# Patient Record
Sex: Male | Born: 1955
Health system: Southern US, Community
[De-identification: ages and names within clinical notes are randomized; demographics above are authoritative.]

## PROBLEM LIST (undated history)

## (undated) DIAGNOSIS — F329 Major depressive disorder, single episode, unspecified: Secondary | ICD-10-CM

## (undated) DIAGNOSIS — Z950 Presence of cardiac pacemaker: Secondary | ICD-10-CM

## (undated) DIAGNOSIS — K220 Achalasia of cardia: Secondary | ICD-10-CM

## (undated) DIAGNOSIS — R001 Bradycardia, unspecified: Secondary | ICD-10-CM

## (undated) DIAGNOSIS — K219 Gastro-esophageal reflux disease without esophagitis: Secondary | ICD-10-CM

## (undated) DIAGNOSIS — G4730 Sleep apnea, unspecified: Secondary | ICD-10-CM

## (undated) DIAGNOSIS — F3289 Other specified depressive episodes: Secondary | ICD-10-CM

## (undated) DIAGNOSIS — M1990 Unspecified osteoarthritis, unspecified site: Secondary | ICD-10-CM

## (undated) DIAGNOSIS — I10 Essential (primary) hypertension: Secondary | ICD-10-CM

## (undated) DIAGNOSIS — J690 Pneumonitis due to inhalation of food and vomit: Secondary | ICD-10-CM

## (undated) DIAGNOSIS — K222 Esophageal obstruction: Secondary | ICD-10-CM

## (undated) DIAGNOSIS — M549 Dorsalgia, unspecified: Secondary | ICD-10-CM

## (undated) DIAGNOSIS — E119 Type 2 diabetes mellitus without complications: Secondary | ICD-10-CM

## (undated) HISTORY — DX: Unspecified osteoarthritis, unspecified site: M19.90

## (undated) HISTORY — PX: ESOPHAGEAL DILATION: SHX303

## (undated) HISTORY — DX: Other specified depressive episodes: F32.89

## (undated) HISTORY — DX: Presence of cardiac pacemaker: Z95.0

## (undated) HISTORY — DX: Esophageal obstruction: K22.2

## (undated) HISTORY — DX: Morbid (severe) obesity due to excess calories: E66.01

## (undated) HISTORY — DX: Bradycardia, unspecified: R00.1

## (undated) HISTORY — DX: Major depressive disorder, single episode, unspecified: F32.9

## (undated) HISTORY — DX: Sleep apnea, unspecified: G47.30

## (undated) HISTORY — DX: Gastro-esophageal reflux disease without esophagitis: K21.9

## (undated) HISTORY — DX: Dorsalgia, unspecified: M54.9

## (undated) HISTORY — PX: PACEMAKER INSERTION: SHX728

## (undated) HISTORY — DX: Type 2 diabetes mellitus without complications: E11.9

## (undated) HISTORY — DX: Achalasia of cardia: K22.0

## (undated) SURGERY — Surgical Case
Anesthesia: *Unknown

---

## 2007-09-19 ENCOUNTER — Encounter (INDEPENDENT_AMBULATORY_CARE_PROVIDER_SITE_OTHER): Payer: Self-pay | Admitting: *Deleted

## 2007-09-19 LAB — CONVERTED CEMR LAB
Cholesterol: 212 mg/dL
Creatinine, Ser: 0.8 mg/dL
Direct LDL: 137 mg/dL
Glucose, Bld: 115 mg/dL
PSA: 0.58 ng/mL
TSH: 176 microintl units/mL
Triglycerides: 202 mg/dL

## 2007-10-03 ENCOUNTER — Emergency Department (HOSPITAL_COMMUNITY): Admission: EM | Admit: 2007-10-03 | Discharge: 2007-10-03 | Payer: Self-pay | Admitting: Emergency Medicine

## 2007-10-28 ENCOUNTER — Encounter: Admission: RE | Admit: 2007-10-28 | Discharge: 2007-10-28 | Payer: Self-pay | Admitting: Gastroenterology

## 2007-10-28 ENCOUNTER — Encounter: Payer: Self-pay | Admitting: Family Medicine

## 2007-11-20 ENCOUNTER — Encounter: Payer: Self-pay | Admitting: Family Medicine

## 2007-11-20 ENCOUNTER — Ambulatory Visit (HOSPITAL_COMMUNITY): Admission: RE | Admit: 2007-11-20 | Discharge: 2007-11-20 | Payer: Self-pay | Admitting: Gastroenterology

## 2008-04-17 ENCOUNTER — Encounter: Payer: Self-pay | Admitting: Family Medicine

## 2009-12-07 ENCOUNTER — Ambulatory Visit: Payer: Self-pay | Admitting: Family Medicine

## 2009-12-07 DIAGNOSIS — E669 Obesity, unspecified: Secondary | ICD-10-CM | POA: Insufficient documentation

## 2009-12-07 DIAGNOSIS — G4733 Obstructive sleep apnea (adult) (pediatric): Secondary | ICD-10-CM | POA: Insufficient documentation

## 2009-12-07 DIAGNOSIS — K219 Gastro-esophageal reflux disease without esophagitis: Secondary | ICD-10-CM | POA: Insufficient documentation

## 2009-12-08 ENCOUNTER — Encounter: Payer: Self-pay | Admitting: Family Medicine

## 2009-12-16 ENCOUNTER — Ambulatory Visit: Payer: Self-pay | Admitting: Family Medicine

## 2009-12-29 ENCOUNTER — Encounter: Payer: Self-pay | Admitting: Family Medicine

## 2010-01-05 ENCOUNTER — Ambulatory Visit: Payer: Self-pay | Admitting: Family Medicine

## 2010-01-05 DIAGNOSIS — M549 Dorsalgia, unspecified: Secondary | ICD-10-CM | POA: Insufficient documentation

## 2010-01-21 ENCOUNTER — Encounter (INDEPENDENT_AMBULATORY_CARE_PROVIDER_SITE_OTHER): Payer: Self-pay | Admitting: *Deleted

## 2010-01-21 DIAGNOSIS — K222 Esophageal obstruction: Secondary | ICD-10-CM | POA: Insufficient documentation

## 2010-01-21 DIAGNOSIS — M1710 Unilateral primary osteoarthritis, unspecified knee: Secondary | ICD-10-CM | POA: Insufficient documentation

## 2010-01-21 DIAGNOSIS — Z8679 Personal history of other diseases of the circulatory system: Secondary | ICD-10-CM | POA: Insufficient documentation

## 2010-01-21 DIAGNOSIS — M1712 Unilateral primary osteoarthritis, left knee: Secondary | ICD-10-CM | POA: Insufficient documentation

## 2010-01-21 DIAGNOSIS — M1990 Unspecified osteoarthritis, unspecified site: Secondary | ICD-10-CM | POA: Insufficient documentation

## 2010-01-21 DIAGNOSIS — F329 Major depressive disorder, single episode, unspecified: Secondary | ICD-10-CM | POA: Insufficient documentation

## 2010-01-21 DIAGNOSIS — Z950 Presence of cardiac pacemaker: Secondary | ICD-10-CM | POA: Insufficient documentation

## 2010-01-21 DIAGNOSIS — F3289 Other specified depressive episodes: Secondary | ICD-10-CM | POA: Insufficient documentation

## 2010-01-26 ENCOUNTER — Encounter: Payer: Self-pay | Admitting: Family Medicine

## 2010-06-28 NOTE — Letter (Signed)
Summary: [facility]  [facility]   Imported By: Lanelle Bal 02/01/2010 09:06:11  _____________________________________________________________________  External Attachment:    Type:   Image     Comment:   External Document

## 2010-06-28 NOTE — Letter (Signed)
Summary: Deboraha Sprang @ [facility] @ [employer]   Imported By: Lanelle Bal 02/01/2010 09:02:48  _____________________________________________________________________  External Attachment:    Type:   Image     Comment:   External Document

## 2010-06-28 NOTE — Letter (Signed)
Summary: Order/[facility]  Order/[facility]   Imported By: Lanelle Bal 12/14/2009 10:32:30  _____________________________________________________________________  External Attachment:    Type:   Image     Comment:   External Document

## 2010-06-28 NOTE — Assessment & Plan Note (Signed)
Summary: pulled back/dlo   Vital Signs:  Patient profile:   55 year old male Height:      72 inches Weight:      336.25 pounds BMI:     45.77 Temp:     98.3 degrees F oral Pulse rate:   92 / minute Pulse rhythm:   regular BP sitting:   142 / 88  (left arm) Cuff size:   large  Vitals Entered By: Delilah Shan CMA Duncan Dull) (January 05, 2010 3:36 PM) CC: Pulled back   History of Present Illness: Moved a lot of furniture and items at work last week.  Out of work yesterday.  Less pain in R leg today with walking.  Was very stiff in AM when getting out of bed.  Had been icing lower back.  R>L lumbar symptoms.  No fevers.  No change BMs other than occ  constipation.  No BRBPR.  No change urine.  Most pain with sitting and lower back not supported.  Pain with walking.  Occ episodes of pain radiating down R leg.  Trying to avoid NSAIDS.   Allergies: 1)  ! 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  Otherwise negative.    Physical Exam  General:  GEN: nad, alert and oriented CV: rrr.  no murmur PULM: ctab, no inc wob ABD: soft, +bs, obese EXT: no edema SKIN: no acute rash  back w/o midline pain.  R SI joint tender on testing.  SLR neg bilaterally, distally NV intact.    Impression & Recommendations:  Problem # 1:  BACK PAIN (ICD-724.5) D/w patient stretching and as needed flexeril.  Sedation caution.  No indication for imaging.  His updated medication list for this problem includes:    Ibuprofen 200 Mg Tabs (Ibuprofen) .Marland Kitchen... As needed    Flexeril 10 Mg Tabs (Cyclobenzaprine hcl) .Marland Kitchen... 0.5-1 tab by mouth three times a day as needed for pain.  sedation caution  Complete Medication List: 1)  Prilosec 20 Mg Cpdr (Omeprazole) .... Takes 2 tablets by mouth once daily 2)  Ibuprofen 200 Mg Tabs (Ibuprofen) .... As needed 3)  Flexeril 10 Mg Tabs (Cyclobenzaprine  hcl) .... 0.5-1 tab by mouth three times a day as needed for pain.  sedation caution  Patient Instructions: 1)  Please schedule a follow-up appointment as needed.  You can go back to work tomrrow as long as you are not needing sedating pain meds.   Do knee-to-chest stretchs and the leg stretches daily.  Prescriptions: FLEXERIL 10 MG TABS (CYCLOBENZAPRINE HCL) 0.5-1 tab by mouth three times a day as needed for pain.  sedation caution  #20 x 1   Entered and Authorized by:   Crawford Givens MD   Signed by:   Crawford Givens MD on 01/05/2010   Method used:   Print then Give to Patient   RxID:   [id number]   Current Allergies (reviewed today): ! MORPHINE

## 2010-06-28 NOTE — Procedures (Signed)
Summary: EGD/[facility]  EGD/[facility]   Imported By: Lanelle Bal 02/01/2010 09:07:12  _____________________________________________________________________  External Attachment:    Type:   Image     Comment:   External Document

## 2010-06-28 NOTE — Assessment & Plan Note (Signed)
Summary: ? HERNIA   Vital Signs:  Patient profile:   55 year old male Height:      72 inches Weight:      336.75 pounds BMI:     45.84 Temp:     98.2 degrees F oral Pulse rate:   88 / minute Pulse rhythm:   regular BP sitting:   126 / 86  (left arm) Cuff size:   large  Vitals Entered By: Delilah Shan CMA Takashi Korol Dull) (December 16, 2009 9:32 AM) CC: ? hernia   History of Present Illness: Wife noticed a bulge in abdomen that "is getting larger according to her" . No FCNAV.  No pain in the area.   No problems/pain with lifting heavy objects.  No other new symptoms.     Allergies: 1)  ! Morphine  Review of Systems       See HPI.  Otherwise noncontributory.    Physical Exam  General:  NAD abdominal exam: no tender to palpation, normal BS, no rebound.  Longitudinal midline bulge is appreciated with straining but I don't feel a specific, isolated defect with any reduceable mass.  There is no tenderness anywhere on the exam.    Impression & Recommendations:  Problem # 1:  PERSON W/FEARED COMPLAINT WHOM NO DX WAS MADE (ICD-V65.5) I don't feel an isolated hernia and I don't think this is a traditional diastasis recti.  I don't see indication for surgical referral.  If patient has an isolated bulge then follow up, but o/w the treatment would be weight loss and observation.  No charge for visit due to the brevity of the encounter.  Orders: No Charge Patient Arrived (NCPA0) (NCPA0)  Complete Medication List: 1)  Prilosec 20 Mg Cpdr (Omeprazole) .... Takes 2 tablets by mouth once daily 2)  Ibuprofen 200 Mg Tabs (Ibuprofen) .... As needed  Patient Instructions: 1)  Please schedule a follow-up appointment as needed .   Current Allergies (reviewed today): ! MORPHINE

## 2010-06-28 NOTE — Letter (Signed)
Summary: Patient No Show/[facility]  Patient No Show/[facility]   Imported By: Lanelle Bal 02/07/2010 08:34:16  _____________________________________________________________________  External Attachment:    Type:   Image     Comment:   External Document

## 2010-06-28 NOTE — Assessment & Plan Note (Signed)
Summary: PT TRANSFER FROM [facility]   Vital Signs:  Patient profile:   55 year old male Height:      72 inches Weight:      338 pounds BMI:     46.01 Temp:     98.3 degrees F oral Pulse rate:   88 / minute Pulse rhythm:   regular BP sitting:   126 / 82  (left arm) Cuff size:   large  Vitals Entered By: Glenn Lyons CMA Glenn Lyons) (December 07, 2009 11:28 AM) CC: Transfer from [place]   History of Present Illness: Obesity- Working to lose weight.  Down about 20lbs.  Knee pain continues esp with stairs and squatting.  Working at Programme researcher, broadcasting/film/video now.  Has done the Atkins diet before but gained the weight back.  patient is aware of disordered eating/overeating.  Interested in nutrition eval/help.  Had prev done food diary.   Reflux continues.  Prev EGD with esophageal stretching twice.  "My eating habits haven't changed."  Prev did foot diary.  "Some days are better than others."  Continues singing with gospel group.  Voice has not been affected.  Occ regurgitation.  Occ feeling of food getting stuck.  Can occ reflux liquids.  Some foods tend to bother patient more than others.    H/o OSA and has CPAP.    Allergies (verified): 1)  ! 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  no murmur PULM: ctab, no inc wob ABD: soft, +bs, obese, benign o/w. EXT: no edema   Impression & Recommendations:  Problem # 1:  GERD (ICD-530.81) Refer to nutrition to help with weight loss and refer to GI is patient requests (or if sx increase).  He would like to avoid another EGD/dilation.  I think that weight loss would be very benefical for his condition.  I have d/w patient AV:[id number] modification and he is willing to work on this.  App nutrition help. continue meds for now.  His updated medication list for this problem includes:    Prilosec 20 Mg Cpdr  (Omeprazole) .Marland Kitchen... Takes 2 tablets by mouth once daily  Orders: Nutrition Referral (Nutrition)  Complete Medication List: 1)  Prilosec 20 Mg Cpdr (Omeprazole) .... Takes 2 tablets by mouth once daily 2)  Ibuprofen 200 Mg Tabs (Ibuprofen) .... As needed  Patient Instructions: 1)  Please schedule a follow-up appointment as needed.  See Glenn Lyons about your referral before your leave today.    Current Allergies (reviewed today): ! MORPHINE

## 2010-06-28 NOTE — Miscellaneous (Signed)
  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 Randa Evens PACEMAKER, PERMANENT (ICD-V45.01) Dual Chamber, St. Jude  04/2005 ANGINA, HX OF (ICD-V12.50) DEPRESSION (ICD-311) BACK PAIN (ICD-724.5) PERSON W/FEARED COMPLAINT WHOM NO DX WAS MADE (ICD-V65.5) GERD (ICD-530.81) SLEEP APNEA (ICD-780.57) MORBID OBESITY (ICD-278.01)   (01/21/2010 14:56) Added new observation of TSH: 176 microintl units/mL (09/19/2007 14:56) Added new observation of PSA: 0.58 ng/mL (09/19/2007 14:56) Added new observation of CREATININE: 0.80 mg/dL (29/51/8841 66:06) Added new observation of BG RANDOM: 115 mg/dL (30/16/0109 32:35) Added new observation of LDL DIR: 137 mg/dL (57/32/2025 42:70) Added new observation of TRIGLYC TOT: 202 mg/dL (62/37/6283 15:17) Added new observation of CHOLESTEROL: 212 mg/dL (61/60/7371 06:26)      Past History:  Past Medical History: OSTEOARTHRITIS (ICD-715.90) ESOPHAGEAL STRICTURE (ICD-530.3) Dr. Randa Evens PACEMAKER,  PERMANENT (ICD-V45.01) Dual Chamber, St. 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

## 2010-07-07 ENCOUNTER — Ambulatory Visit: Payer: Self-pay | Admitting: Family Medicine

## 2010-07-11 ENCOUNTER — Encounter (INDEPENDENT_AMBULATORY_CARE_PROVIDER_SITE_OTHER): Payer: Self-pay | Admitting: *Deleted

## 2010-07-11 ENCOUNTER — Encounter: Payer: Self-pay | Admitting: Family Medicine

## 2010-07-11 ENCOUNTER — Ambulatory Visit (INDEPENDENT_AMBULATORY_CARE_PROVIDER_SITE_OTHER): Payer: BC Managed Care – PPO | Admitting: Family Medicine

## 2010-07-11 ENCOUNTER — Ambulatory Visit: Payer: Self-pay | Admitting: Family Medicine

## 2010-07-11 DIAGNOSIS — J019 Acute sinusitis, unspecified: Secondary | ICD-10-CM

## 2010-07-20 NOTE — Letter (Signed)
Summary: New Patient letter  [facility]  [address] [place], [place] [address]   Phone: [phone]  Fax: [phone]       07/11/2010 MRN: [id number]  [facility] [address] [place], [place]  [address]  [place]  Dear Glenn Lyons,  Welcome to the Gastroenterology Division at [facility].    You are scheduled to see Dr.  Stan Head on August 17, 2010 at 1:45pm on the 3rd floor at [facility], [address]  We ask that you try to arrive at our office 15 minutes prior to your appointment time to allow for check-in.  We would like you to complete the enclosed self-administered evaluation form prior to your visit and bring it with you on the day of your appointment.  We will review it with you.  Also, please bring a complete list of all your medications or, if you prefer, bring the medication bottles and we will list them.  Please bring your insurance card so that we may make a copy of it.  If your insurance requires a referral to see a specialist, please bring your referral form from your primary care physician.  Co-payments are due at the time of your visit and may be paid by cash, check or credit card.     Your office visit will consist of a consult with your physician (includes a physical exam), any laboratory testing he/she may order, scheduling of any necessary diagnostic testing (e.g. x-ray, ultrasound, CT-scan), and scheduling of a procedure (e.g. Endoscopy, Colonoscopy) if required.  Please allow enough time on your schedule to allow for any/all of these possibilities.    If you cannot keep your appointment, please call [phone] to cancel or reschedule prior to your appointment date.  This allows [place] the opportunity to schedule an appointment for another patient in need of care.  If you do not cancel or reschedule by 5 p.m. the business day prior to your appointment date, you will be charged a $50.00 late cancellation/no-show fee.    Thank you  for choosing  Gastroenterology for your medical needs.  We appreciate the opportunity to care for you.  Please visit [place] at our website  to learn more about our practice.                     Sincerely,                                                             The Gastroenterology Division

## 2010-07-20 NOTE — Assessment & Plan Note (Signed)
Summary: sinus infection/alc   Vital Signs:  Patient profile:   55 year old male Height:      72 inches Weight:      337.50 pounds BMI:     45.94 Temp:     98.3 degrees F oral Pulse rate:   84 / minute Pulse rhythm:   regular BP sitting:   102 / 64  (left arm) Cuff size:   large  Vitals Entered By: Delilah Shan CMA Phuoc Huy Dull) (July 11, 2010 3:45 PM) CC: ? sinus infection   History of Present Illness: Allergies worse after moving to [place].  Usually with clear, intermittent rhinorrhea.  Now with colored discharge and voice change.  HA for 1 week intermittently.  ST.  No fevers.  Ears are "stopped up".  No coughing much, minimal sputum.  No wheeze.    due for colonoscopy.  Allergies: 1)  ! Morphine  Review of Systems       See HPI.  Otherwise negative.    Physical Exam  General:  GEN: nad, alert and oriented HEENT: mucous membranes moist, TM w/o erythema, nasal epithelium injected, OP with cobblestoning, no exudates NECK: supple w/o LA CV: rrr. PULM: ctab, no inc wob EXT: no edema    Impression & Recommendations:  Problem # 1:  SCREENING, COLON CANCER (ICD-V76.51) refer.  Orders: Gastroenterology Referral (GI)  Problem # 2:  SINUSITIS, ACUTE (ICD-461.9) I would start amoxil and flonase given the timing and the symptoms.  nontoxic and follow up as needed.  he agrees.  Voice rest in meantime.  His updated medication list for this problem includes:    Flonase 50 Mcg/act Susp (Fluticasone propionate) .Marland Kitchen... 2 sprays per nostril per day    Amoxicillin 875 Mg Tabs (Amoxicillin) .Marland Kitchen... 1 by mouth two times a day  Orders: Prescription Created Electronically [phone])  Complete Medication List: 1)  Prilosec 20 Mg Cpdr (Omeprazole) .... Takes 2 tablets by mouth once daily 2)  Ibuprofen 200 Mg Tabs (Ibuprofen) .... As needed 3)  Flexeril 10 Mg Tabs (Cyclobenzaprine hcl) .... 0.5-1 tab by mouth three times a day as needed for pain.  sedation caution 4)  Flonase 50 Mcg/act Susp  (Fluticasone propionate) .... 2 sprays per nostril per day 5)  Amoxicillin 875 Mg Tabs (Amoxicillin) .Marland Kitchen.. 1 by mouth two times a day  Patient Instructions: 1)  Start the antibiotics and the flonase today.  Try to rest your voice.  See Shirlee Limerick about your referral before your leave today.  Take care.  Prescriptions: AMOXICILLIN 875 MG TABS (AMOXICILLIN) 1 by mouth two times a day  #20 x 0   Entered and Authorized by:   Crawford Givens MD   Signed by:   Crawford Givens MD on 07/11/2010   Method used:   Electronically to        [facility] [phone]* (retail)       [address]       [place], [place]  [address]       Ph: [phone]       Fax: [phone]   RxID:   [id number] FLONASE 50 MCG/ACT SUSP (FLUTICASONE PROPIONATE) 2 sprays per nostril per day  #1 x 1   Entered and Authorized by:   Crawford Givens MD   Signed by:   Crawford Givens MD on 07/11/2010   Method used:   [facility] to        [facility] [phone]* (retail)  [address]       [place], [place]  [address]       Ph: [phone]       Fax: [phone]   RxID:   [id number]    Orders Added: 1)  Est. Patient Level III [401[address]] 2)  Prescription Created Electronically [phone] 3)  Gastroenterology Referral [GI]    Current Allergies (reviewed today): ! MORPHINE

## 2010-07-20 NOTE — Letter (Signed)
Summary: Out of Work  [facility]  [address] [place], [place] [address]   Phone: [phone]  Fax: [phone]    July 11, 2010   Employee:  Levan Broeker    To Whom It May Concern:   For Medical reasons, please excuse the above named employee from work for the following dates:  Start:   07/11/10  End:   allow patient to work from home through 07/12/10 and rest voice.  If you need additional information, please feel free to contact our office.         Sincerely,    Crawford Givens MD

## 2010-07-27 ENCOUNTER — Telehealth: Payer: Self-pay | Admitting: Family Medicine

## 2010-08-04 NOTE — Progress Notes (Signed)
Summary: Amoxicillin  Phone Note Refill Request Message from:  Fax from Pharmacy on July 27, 2010 9:13 AM  Refills Requested: Medication #1:  AMOXICILLIN 875 MG TABS 1 by mouth two times a day. [facility] [place]  Phone:   [phone]   Method Requested: Electronic Initial call taken by: Delilah Shan CMA Armanda Forand Dull),  July 27, 2010 9:14 AM  Follow-up for Phone Call        please clarify this with the patient.  what is his status?  What are his symptoms?  Crawford Givens MD  July 27, 2010 5:59 PM  Spoke with wife, she says that patient had started to feel some better while taking the amoxicillin, but as soon as he had finished them his symptoms started to come bak. He is having some cough, some green mucus, throat feeling scratchy.  Melody Comas  July 28, 2010 11:55 AM    Additional Follow-up for Phone Call Additional follow up Details #1::        I sent it in. please have him follow up if not improved.  thanks.  Crawford Givens MD  July 28, 2010 1:17 PM   Patient Advised.  Additional Follow-up by: Delilah Shan CMA Ekta Dancer Dull),  July 28, 2010 2:03 PM    Prescriptions: AMOXICILLIN 875 MG TABS (AMOXICILLIN) 1 by mouth two times a day  #20 x 0   Entered and Authorized by:   Crawford Givens MD   Signed by:   Crawford Givens MD on 07/28/2010   Method used:   [facility]ronically [facility]        [facility] [phone]* (retail)       [address]       [place], [place]  [address]       Ph: [phone]       Fax: [phone]   RxID:   [id number]

## 2010-08-17 ENCOUNTER — Ambulatory Visit (INDEPENDENT_AMBULATORY_CARE_PROVIDER_SITE_OTHER): Payer: BC Managed Care – PPO | Admitting: Internal Medicine

## 2010-08-17 ENCOUNTER — Encounter: Payer: Self-pay | Admitting: Internal Medicine

## 2010-08-17 VITALS — BP 104/70 | HR 60 | Ht 72.0 in | Wt 338.8 lb

## 2010-08-17 DIAGNOSIS — R1319 Other dysphagia: Secondary | ICD-10-CM

## 2010-08-17 DIAGNOSIS — Z1211 Encounter for screening for malignant neoplasm of colon: Secondary | ICD-10-CM

## 2010-08-17 DIAGNOSIS — K219 Gastro-esophageal reflux disease without esophagitis: Secondary | ICD-10-CM

## 2010-08-17 DIAGNOSIS — K224 Dyskinesia of esophagus: Secondary | ICD-10-CM

## 2010-08-17 DIAGNOSIS — K222 Esophageal obstruction: Secondary | ICD-10-CM

## 2010-08-17 DIAGNOSIS — K220 Achalasia of cardia: Secondary | ICD-10-CM | POA: Insufficient documentation

## 2010-08-17 NOTE — Assessment & Plan Note (Signed)
He has some of this but is probably on the basis of dysmotility. He does not have classic heartburn problems. The balance of evidence goes against a peptic stricture.

## 2010-08-17 NOTE — Progress Notes (Signed)
HPI:   55 year old white male here to discuss dysphagia and possible screening colonoscopy. He was referred by Dr. Para March. He has a long-standing history of dysphagia going back 6-8 years. He says he had a good response to initial esophageal dilation. I have records that show subsequent dilation in June of 2009, where a 15 mm maximum Savary dilation was performed by Dr. Randa Evens. He says he never really had a good response to that. There is also a barium swallow demonstrating marked esophageal dysmotility with ineffective peristalsis, stasis and tertiary contractions. A distal esophageal stricture was described. In the endoscopy report it is noted that there was a " maybe some mild narrowing ". The barium tablet administered during the barium swallow did pass with a moderate delay.   He describes chronic regurgitation of food and liquids. Sometimes worse than others. He has not lost weight and he is actually obese. He did lose with about 25 pounds last year on the Atkins diet. He has solid more so than liquid dysphagia but sometimes liquids are a problem. He will awaken with a coating in his mouth and sometimes the rarely a small amount of fluid on the pillow. He does wear a sleep apnea mask at night. If he bends over he will regurgitate. 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 He does not recall being given a diagnosis of esophageal dysmotility though he has been diagnosed with GERD. He is not aware of achalasia as a diagnosis.   Note that he was originally diagnosed with dysphagia and esophageal dysmotility when he presented with chest pain that was thought to be cardiac.   GI review of systems is otherwise negative.          ROS: Notable for sleep apnea problems, all other review systems are negative except as otherwise noted above.       General: Well-developed, well-nourished and in no acute distress obese Vitals:  Reviewed and listed above Eyes:anicteric., PERRLA Mouth and posterior pharynx: normal.  Neck: supple w/o thyromegaly or mass.  Lungs: clear. Heart: S1S2, no rubs, murmurs, gallops. Abdomen: obese, soft, non-tender, no hepatosplenomegaly, hernia, or mass and BS+.  Rectal: deferred Lymphatics: no cervical, [place] nodes. Extremities:  no edema Skin no rash. Neuro: nonfocal. A&O x 3.  Psych: appropriate mood and  affect.   Assessment and plan:  Colon Cancer Screening:  He is at average risk. It will be appropriate to perform a screening colonoscopy. I have explained the risks benefits and indications. This will be deferred until his esophageal workup is completed at least a manometry and barium swallow portions.

## 2010-08-17 NOTE — Assessment & Plan Note (Signed)
He should have a screening colonoscopy and this was discussed. We'll defer that until I know the full need for endoscopic workup and he may need to combine an upper endoscopy with possible dilation and his colonoscopy for screening. He is aware of the plan.

## 2010-08-17 NOTE — Assessment & Plan Note (Signed)
I think this is more likely spasm and inadequate relaxation. I suspect he has achalasia. He at least has some form of esophageal dysmotility. Plan for repeat barium swallow with esophageal manometry. Further plans pending that. I did explain to him that I think this is a diagnosis though understandable is probably not correct. I briefly discussed treatment options  That include surgery, Botox, an aggressive balloon dilation

## 2010-08-17 NOTE — Patient Instructions (Addendum)
You have been scheduled for a Barium Swallow and an Esophageal Manometry. We have given you instructions and the date and time for the procedures. We will contact you with the results.

## 2010-08-17 NOTE — Assessment & Plan Note (Signed)
As stated under esophageal stricture, I think this is his main issue he probably has achalasia. It is mainly a quality of life issue for him at this point. There are certainly risks of  Other problems in the future including aspiration. The weight repeat barium swallow and esophageal manometry. A repeat upper endoscopy will probably be needed though presumably he does not have pseudo-achalasia based upon his previous endoscopies.

## 2010-08-18 ENCOUNTER — Encounter: Payer: Self-pay | Admitting: Internal Medicine

## 2010-08-19 ENCOUNTER — Ambulatory Visit (HOSPITAL_COMMUNITY)
Admission: RE | Admit: 2010-08-19 | Discharge: 2010-08-19 | Disposition: A | Discharge status: Home / Self Care | Payer: BC Managed Care – PPO | Source: Ambulatory Visit | Attending: Internal Medicine | Admitting: Internal Medicine

## 2010-08-19 DIAGNOSIS — R079 Chest pain, unspecified: Secondary | ICD-10-CM | POA: Insufficient documentation

## 2010-08-19 DIAGNOSIS — K222 Esophageal obstruction: Secondary | ICD-10-CM | POA: Insufficient documentation

## 2010-08-19 DIAGNOSIS — R1319 Other dysphagia: Secondary | ICD-10-CM

## 2010-08-19 DIAGNOSIS — R1310 Dysphagia, unspecified: Secondary | ICD-10-CM | POA: Insufficient documentation

## 2010-08-19 DIAGNOSIS — K224 Dyskinesia of esophagus: Secondary | ICD-10-CM

## 2010-08-30 ENCOUNTER — Ambulatory Visit (HOSPITAL_COMMUNITY)
Admission: RE | Admit: 2010-08-30 | Discharge: 2010-08-30 | Disposition: A | Discharge status: Home / Self Care | Payer: BC Managed Care – PPO | Source: Ambulatory Visit | Attending: Internal Medicine | Admitting: Internal Medicine

## 2010-08-30 DIAGNOSIS — R1310 Dysphagia, unspecified: Secondary | ICD-10-CM | POA: Insufficient documentation

## 2010-09-08 ENCOUNTER — Telehealth: Payer: Self-pay | Admitting: *Deleted

## 2010-09-08 NOTE — Telephone Encounter (Signed)
Patient saw Dr. Leone Payor fo his barium swallow and he says that he never heard anything back. He is asking for a phone call to discuss his options at this point. Please advise.

## 2010-09-08 NOTE — Telephone Encounter (Signed)
I called him back and gave him the basics of the report.  I told him I would like Dr. Marvell Fuller input and will await that.  I appreciated GI help with this patient.

## 2010-09-09 NOTE — Telephone Encounter (Signed)
Please call pt. Dr. Leone Payor sent me a note back.  Pt does have irregular muscle contraction in the esophagus, but he is awaiting the esophageal pressure testing results.  Dr. Leone Payor was out of the office, and that delayed his response.  Thanks.

## 2010-09-09 NOTE — Telephone Encounter (Signed)
Sorry Let him know it looks like achalasia i was also waiting on mmanometry And also am away

## 2010-09-12 NOTE — Telephone Encounter (Signed)
I spoke with Glenn Lyons at [facility] EGD and patient was not able to perform the [facility] could not pass through the UES.

## 2010-09-13 NOTE — Telephone Encounter (Signed)
I explained results - aperistalsis and Ba swallow that look like achalasia.  Will need surgical refrerral and I will also explore balloon dili option  Needs screening colon  Do not think needs another EGD  He has joined a new gospel singing group and will be travelling a lot so may want to wait months before surgery. Also knows weight loss would be helpful pre-op.

## 2010-09-13 NOTE — Telephone Encounter (Signed)
I appreciate GI help.  I have signed off on the note.

## 2010-09-14 ENCOUNTER — Encounter: Payer: Self-pay | Admitting: Internal Medicine

## 2010-09-14 DIAGNOSIS — K220 Achalasia of cardia: Secondary | ICD-10-CM

## 2010-09-14 NOTE — Progress Notes (Signed)
Quick Note:  ESOPHAGEAL MANOMETRY Aperistalsis with normal amplitude contractions Unable to pass through LES ______

## 2010-09-14 NOTE — Telephone Encounter (Signed)
I did discuss possible balloon dili but we will start with [facility] referral

## 2010-09-14 NOTE — Telephone Encounter (Signed)
Spoke with Trula Ore scheduling [phone] option 1) Scheduled patient for Oct 12, 2010 at 10:00 AM with Dr. Sima Matas. Faxed records to  [phone] Attention: Alona Bene and Archie Patten

## 2010-09-14 NOTE — Telephone Encounter (Signed)
Please obtain an appointment with Dr. Sima Matas at [facility] re: achalasia - let me know date and I will then do a letter  [name] to be seen and we can send the manometry tracings and our notes, also.  [name].  He also needs a screening colonoscopy (v76.51) set up with me.  Also mail patient info I will give you.

## 2010-09-15 NOTE — Telephone Encounter (Signed)
Patient is scheduled for colon [facility] 10/26/10 10:00, pre-visit 10/18/10 8:00

## 2010-09-22 ENCOUNTER — Other Ambulatory Visit: Payer: BC Managed Care – PPO | Admitting: Internal Medicine

## 2010-09-27 NOTE — Op Note (Signed)
  NAMETHEO, KRUMHOLZ                ACCOUNT NO.:  [id number]  MEDICAL RECORD NO.:  [id number]           PATIENT TYPE:  O  LOCATION:  WLEN                         FACILITY:  [facility]  PHYSICIAN:  Iva Boop, MD,FACGDATE OF BIRTH:  August 19, 1955  DATE OF PROCEDURE: DATE OF DISCHARGE:  08/30/2010                              OPERATIVE REPORT   PROCEDURE:  Esophageal manometry.  INDICATIONS:  Dysphagia, question achalasia on barium swallow.  Solid state esophageal manometry was performed by the nursing staff at [facility] End[facility] Department.  Study was incomplete because the catheter could not be passed beyond the lower esophageal sphincter. Esophageal tracings were obtained.  Review of the tracings shows aperistalsis with simultaneous contractions throughout on the wet swallows.  The amplitudes of the contractions appear normal overall.  ASSESSMENT:  Aperistalsis.  The overall clinical picture is consistent with a vigorous achalasia situation, I think.     Iva Boop, MD,FACG     CEG/MEDQ  D:  09/14/2010  T:  09/14/2010  Job:  [id number]  Electronically Signed by Stan Head MDFACG on 09/27/2010 01:08:28 PM

## 2010-10-11 NOTE — Op Note (Signed)
NAMEJOVANY, Glenn Lyons                ACCOUNT NO.:  [id number]   MEDICAL RECORD NO.:  [id number]          PATIENT TYPE:  AMB   LOCATION:  ENDO                         FACILITY:  [facility]   PHYSICIAN:  James L. Malon Kindle., M.D.DATE OF BIRTH:  1955/07/23   DATE OF PROCEDURE:  11/20/2007  DATE OF DISCHARGE:                               OPERATIVE REPORT   PROCEDURE:  Esophagogastroduodenoscopy with Savary dilatation.   MEDICATIONS:  Cetacaine spray, fentanyl 50 mcg, Versed 7 mg IV.   INDICATIONS:  Patient with a previous history of a stricture.  Has had  previous dilatations in the past.  His barium swallow showed a stricture  and showed poor peristalsis as well.  He is getting some relief from  proton pump inhibitor.   DESCRIPTION OF PROCEDURE:  The procedure had been explained to the  patient including potential risks and benefits and consent obtained.  In  the left lateral decubitus position on the fluoro unit using  fluoroscopic guidance, the Pentax upper scope was inserted.  The distal  esophagus was reached.  There was maybe some mild narrowing.  The scope  easily passed.  Complete endoscopy was performed and was normal.  Using  fluoroscopic guidance, the Savary guidewire was placed through the scope  and the scope withdrawn.  The patient's  neck was then extended.  He  uses CPAP at home and this apparatus was continued throughout the whole  procedure with good O2 sats.  We then passed a 12, 12.8, 14, 15-mm  Savary dilators using fluoroscopic guidance.  After passage of the 15-mm  dilator, the dilator and guidewire were withdrawn as a unit.  There was  no heme.  The scope was withdrawn.  The patient tolerated the procedure  well.  There were no immediate complications.   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   PLAN:  Routine post dilatation orders.  Remain on proton pump inhibitor.  Will repeat on an as-needed basis.  Will reevaluate back in the office  in 3  months.           ______________________________  Llana Aliment. Malon Kindle., M.D.     Waldron Session  D:  11/20/2007  T:  11/20/2007  Job:  [id number]   cc:   Holley Bouche, M.D.  Fax: [phone]

## 2010-10-12 ENCOUNTER — Other Ambulatory Visit: Payer: BC Managed Care – PPO | Admitting: Internal Medicine

## 2010-10-18 ENCOUNTER — Ambulatory Visit (AMBULATORY_SURGERY_CENTER): Payer: BC Managed Care – PPO

## 2010-10-18 ENCOUNTER — Encounter: Payer: Self-pay | Admitting: Internal Medicine

## 2010-10-18 VITALS — Ht 72.0 in | Wt 329.4 lb

## 2010-10-18 DIAGNOSIS — Z1211 Encounter for screening for malignant neoplasm of colon: Secondary | ICD-10-CM

## 2010-10-18 MED ORDER — PEG-KCL-NACL-NASULF-NA ASC-C 100 G PO SOLR: 1.0000 | Freq: Once | ORAL | Status: AC

## 2010-10-26 ENCOUNTER — Other Ambulatory Visit: Payer: BC Managed Care – PPO | Admitting: Internal Medicine

## 2010-10-27 NOTE — Progress Notes (Signed)
Quick Note:  Previosul read and consistent with achalasia overall with aperistalsis ______

## 2010-11-01 ENCOUNTER — Encounter: Payer: Self-pay | Admitting: Internal Medicine

## 2010-11-01 ENCOUNTER — Ambulatory Visit (AMBULATORY_SURGERY_CENTER): Payer: BC Managed Care – PPO | Admitting: Internal Medicine

## 2010-11-01 VITALS — BP 124/75 | HR 59 | Temp 96.1°F | Resp 18 | Ht 70.0 in | Wt 329.0 lb

## 2010-11-01 DIAGNOSIS — D126 Benign neoplasm of colon, unspecified: Secondary | ICD-10-CM

## 2010-11-01 DIAGNOSIS — Z139 Encounter for screening, unspecified: Secondary | ICD-10-CM

## 2010-11-01 DIAGNOSIS — K648 Other hemorrhoids: Secondary | ICD-10-CM

## 2010-11-01 DIAGNOSIS — K220 Achalasia of cardia: Secondary | ICD-10-CM

## 2010-11-01 DIAGNOSIS — K5730 Diverticulosis of large intestine without perforation or abscess without bleeding: Secondary | ICD-10-CM

## 2010-11-01 DIAGNOSIS — Z1211 Encounter for screening for malignant neoplasm of colon: Secondary | ICD-10-CM

## 2010-11-01 HISTORY — PX: COLONOSCOPY W/ POLYPECTOMY: SHX1380

## 2010-11-01 MED ORDER — SODIUM CHLORIDE 0.9 % IV SOLN: 500.0000 mL | INTRAVENOUS | Status: DC

## 2010-11-01 NOTE — Patient Instructions (Signed)
Please review discharge instructions Please review information given about: polyps, diverticulosis, hemorrhoids

## 2010-11-02 ENCOUNTER — Telehealth: Payer: Self-pay

## 2010-11-02 NOTE — Telephone Encounter (Signed)

## 2010-11-08 NOTE — Progress Notes (Signed)
Quick Note:  Adenomas, hyperplastic polyps, ? Hyperplastic polyp syndrome Diverticulosis Hemorrhoids  Return for colonoscopy In 2 years ______

## 2011-01-06 ENCOUNTER — Ambulatory Visit (INDEPENDENT_AMBULATORY_CARE_PROVIDER_SITE_OTHER): Payer: BC Managed Care – PPO | Admitting: Family Medicine

## 2011-01-06 ENCOUNTER — Encounter: Payer: Self-pay | Admitting: Family Medicine

## 2011-01-06 VITALS — BP 114/59 | HR 68 | Temp 97.8°F | Wt 342.1 lb

## 2011-01-06 DIAGNOSIS — R209 Unspecified disturbances of skin sensation: Secondary | ICD-10-CM

## 2011-01-06 DIAGNOSIS — R202 Paresthesia of skin: Secondary | ICD-10-CM

## 2011-01-06 LAB — POCT CBG (FASTING - GLUCOSE)-MANUAL ENTRY: Glucose Fasting, POC: 121 mg/dL

## 2011-01-06 NOTE — Progress Notes (Signed)
"  Internal pains that shoot throughout my body."  Going on intermittently for months.  It may be in the foot or the hands or the trunk.  "It feels like being pinched internally."  Brief, 3-4 seconds.  Not in a joint.  No fevers.  No weakness/numbness.  No other focal sx.  He doesn't feel depressed.  Still using CPAP and has f/u for OSA pending.    He thinks is stress related.  He's not singing as much and his wife is caring for a debilitated neighbor.  This has been tough on him and his family.  He's worried about the strain on his wife.  He's gaining weight.  He wasn't able to handle the travel with a singing group, so he cut back on that and 'I've gotten lazy in the meantime.'    He had his pacer checked with cards and he had colonoscopy done.    Meds, vitals, and allergies reviewed.   ROS: See HPI.  Otherwise, noncontributory.  GEN: nad, alert and oriented, overweight HEENT: mucous membranes moist NECK: supple w/o LA CV: rrr PULM: ctab, no inc wob ABD: soft, +bs EXT: no edema SKIN: no acute rash CN 2-12 wnl B, S/S/DTR wnl x4

## 2011-01-06 NOTE — Patient Instructions (Signed)
Get your labs done through the cardiology clinic and let me know if you continue to have symptoms.  Glad to see you.

## 2011-01-08 DIAGNOSIS — R202 Paresthesia of skin: Secondary | ICD-10-CM | POA: Insufficient documentation

## 2011-02-07 ENCOUNTER — Ambulatory Visit: Payer: BC Managed Care – PPO | Admitting: Family Medicine

## 2011-03-09 ENCOUNTER — Ambulatory Visit: Payer: BC Managed Care – PPO | Admitting: Family Medicine

## 2011-05-02 ENCOUNTER — Ambulatory Visit: Payer: BC Managed Care – PPO | Admitting: Family Medicine

## 2011-06-12 ENCOUNTER — Ambulatory Visit (INDEPENDENT_AMBULATORY_CARE_PROVIDER_SITE_OTHER): Payer: Self-pay | Admitting: Family Medicine

## 2011-06-12 ENCOUNTER — Encounter: Payer: Self-pay | Admitting: Family Medicine

## 2011-06-12 DIAGNOSIS — M25569 Pain in unspecified knee: Secondary | ICD-10-CM

## 2011-06-12 DIAGNOSIS — M25562 Pain in left knee: Secondary | ICD-10-CM

## 2011-06-12 NOTE — Progress Notes (Signed)
Pain behind L knee.  Would occ have some discomfort over the last few years.  With certain movements, he would get some pain.  Now with progressive pain, having pain more often.  If he sits for a few minutes, then tried to get up, he feels weak and uncomfortable.  It get better with walking.  Less pain supine.  He prev had some L calf skin irritation over the last few days, resolved.  Calf not ttp now.  Popliteal fossa can get puffy occ.    Meds, vitals, and allergies reviewed.   ROS: See HPI.  Otherwise, noncontributory.  nad Overweight Normal inspection of knee Knee feels stable on ACL/MCL/LCL testing Lateral joint line ttp Pain with patellar compression No erythema

## 2011-06-12 NOTE — Patient Instructions (Signed)
I would start walking more and use the home exercises (straight leg raise, externally rotated straight leg raise and side leg lift).  Don't use weights on the leg lifts.  Consider a soft knee sleeve.  Take care.

## 2011-06-15 ENCOUNTER — Encounter: Payer: Self-pay | Admitting: Family Medicine

## 2011-06-15 DIAGNOSIS — M1710 Unilateral primary osteoarthritis, unspecified knee: Secondary | ICD-10-CM | POA: Insufficient documentation

## 2011-06-15 DIAGNOSIS — M179 Osteoarthritis of knee, unspecified: Secondary | ICD-10-CM | POA: Insufficient documentation

## 2011-06-15 NOTE — Assessment & Plan Note (Signed)
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  No concerns for vasc process given the long duration.  D/w pt about home exercise as he has hip abductor weakness.  He needs to lose weight.  He understood.

## 2011-06-19 ENCOUNTER — Telehealth: Payer: Self-pay | Admitting: Family Medicine

## 2011-06-19 DIAGNOSIS — M25569 Pain in unspecified knee: Secondary | ICD-10-CM

## 2011-06-19 NOTE — Telephone Encounter (Signed)
The patient went to the ER after a fall.  He irritated the same knee he visited the doctor on 06/12/11.  ER said he needs an MRI, so the patient needs a MRI referral ASAP.  You can reach the wife Okey Regal at [phone] or the patient at [phone].

## 2011-06-20 NOTE — Telephone Encounter (Signed)
I need the records from the ER.  I would refer to ortho if he's having continued knee trouble.  I wouldn't order the MR due to his pacer.  I'll need ortho help.

## 2011-06-20 NOTE — Telephone Encounter (Signed)
Wife called in this morning insistent that we schedule the MRI.  As best I can tell, they went to ER or possibly [facility].  They x-rayed the knee and said he needed an MRI and Ortho referral.  She was told to call if they did not hear anything by the next day (yesterday).  She called the facility and was told they have no record of having seen the patient.  Apparently there is a mix-up in the records.  She says the patient needs the MRI right away.  I phoned the wife back telling her that this order for the MRI has to come from the facility that saw him last.  I encouraged her to call them back in hopes that the records issue has been resolved.  I later spoke with the wife and she says that Shirlee Limerick made them an appt with Ortho for early Wed AM 06/20/10.  Wife was satisfied with that.

## 2011-06-21 ENCOUNTER — Telehealth: Payer: Self-pay | Admitting: Family Medicine

## 2011-06-21 MED ORDER — VALACYCLOVIR HCL 1 G PO TABS: 1000.0000 mg | ORAL_TABLET | Freq: Two times a day (BID) | ORAL | Status: DC

## 2011-06-21 NOTE — Telephone Encounter (Signed)
Pt called, says he is in some pain. He has fluid removed from his knee and not able to get around. Need meds. See message..Glenn Lyons

## 2011-06-21 NOTE — Telephone Encounter (Signed)
Triage Record Num: [id number] Operator: Freddie Breech Patient Name: Glenn Lyons Call Date & Time: 06/21/2011 1:07:15PM Patient Phone: [phone] PCP: Crawford Givens Patient Gender: Male PCP Fax : Patient DOB: 09/09/1955 Practice Name: [facility] Reason for Call: Caller: Pt; PCP: Crawford Givens Clelia Croft); CB#: [phone]; Call regarding Urinary Pain; C/o burning with urination, the tip of his penis is sore onset 06/18/11. Pt declines an appt as he lives in [place], has a recent knee injury that is very painful. Pt ? if an abx can be called in. [facility], [place]. Note sent per pt request. Protocol(s) Used: Urinary Symptoms - Male Recommended Outcome per Protocol: See Provider within 24 hours Override Outcome if Used in Protocol: Information Noted and Sent to Office RN Reason for Override Outcome: Per [employer]. Reason for Outcome: Urinary tract symptoms AND any flank or low back, penis or scrotal pain Care Advice: Increase intake of fluids. Try to drink 8 oz. (.2 liter) every hour when awake, including unsweetened cranberry juice, unless on restricted fluids for other medical reasons. Take sips of fluid or eat ice chips if nauseated or vomiting. ~ 06/21/2011 1:19:24PM Page 1 of 1 [email or web address]

## 2011-06-21 NOTE — Telephone Encounter (Signed)
Called patient after speaking with Dr. Para March and advised him that Dr. Para March will call him after he is finished seeing patients. Patient was advised that if he does not want to wait to get the call he can go to urgent care for treatment. Was informed by patient that he will wait for Dr. Lianne Bushy call.

## 2011-06-21 NOTE — Telephone Encounter (Signed)
I called pt.  He is having trouble getting out due to his knee.  He has f/u about his knee pending.  He now has a painful rash with 4 small ulcers on the distal portion of his penis.  Dysuria is only at the tip of the penis.  No abd/pelvic pain.  No vomiting, no fevers.  I told him that I thought he could likely have an HSV infection, but couldn't be sure w/o seeing him.  I would go ahead and treat with valtrex under the assumption of HSV.  He agreed.  If not improving, then he'll need to get seen.  Hopefully that would be more feasible as the knee improves.  He agreed with the plan.

## 2011-06-21 NOTE — Telephone Encounter (Signed)
Patient called back and is upset because he was not told when he called earlier that Dr. Para March would not be in until 2:00. Explained to patient that Dr. Para March is with patients and will address his phone call as soon as he can. Patient states that if Dr. Para March is not going to call something in for him he is going to an urgent care. Advised patient that I will discuss this with Dr. Para March when he comes out of a room and will call him back,

## 2011-07-14 ENCOUNTER — Telehealth: Payer: Self-pay | Admitting: Family Medicine

## 2011-07-14 MED ORDER — VALACYCLOVIR HCL 1 G PO TABS: 1000.0000 mg | ORAL_TABLET | Freq: Two times a day (BID) | ORAL | Status: AC

## 2011-07-14 NOTE — Telephone Encounter (Signed)
I called pt.  He had gotten better, but then had a similar lesion come back today.  Will restart valtrex and if not resolved or if recurrent, will need eval.

## 2011-07-14 NOTE — Telephone Encounter (Signed)
Pt called, would like to speak w/ Dr. Para March regarding a personal issue. Pt says he has been on some type of medication and would like to discuss some of the side effects

## 2011-08-26 ENCOUNTER — Ambulatory Visit: Payer: Self-pay | Admitting: Family Medicine

## 2011-08-28 ENCOUNTER — Telehealth: Payer: Self-pay | Admitting: Family Medicine

## 2011-08-28 NOTE — Telephone Encounter (Signed)
Triage Record Num: [id number] Operator: Thayer Headings Patient Name: Glenn Lyons Call Date & Time: 08/25/2011 1:02:49PM Patient Phone: [phone] PCP: Crawford Givens Patient Gender: Male PCP Fax : Patient DOB: 15-Dec-1955 Practice Name: [facility] Reason for Call: Caller: Carol/Spouse; PCP: Crawford Givens Clelia Croft); CB#: [phone]; Calling today 08/25/11 regarding having tenderness in genital area. Pt has been on Valcyclovir for genital lesions, (wife denies hx of herpes). Wanting to have script called in, husband is going of town tomorrow. Pulled EMR from [facility] and note from MD on 07/14/11, says pt will have to be seen for evaluation before next refill if he has another flare up. Wife gave husbands phone number [phone], contacted husband and he advises does not have any lesions at this time, but feels like some might be coming. Afebrile. Emergent symptoms r/o by Genital Herpes, Diagnosed or Suspected Exposure with exception of current outbreak and requesting prescription refill. Care advice given. Per MD note advised pt that an appt is needed. Appt scheduled for tomorrow 08/26/11 at 9:30 with Dr. Caryl Never at [place] office. Protocol(s) Used: Genital Herpes, Diagnosed or Suspected Exposure Recommended Outcome per Protocol: Call Provider within 24 Hours Override Outcome if Used in Protocol: See Provider within 24 hours RN Reason for Override Outcome: Physician Orders. Reason for Outcome: Current outbreak AND requesting prescription or refill Care Advice: ~ 08/25/2011 1:31:50PM Page 1 of 1 [email or web address]_[email or web address]

## 2011-10-12 ENCOUNTER — Telehealth: Payer: Self-pay

## 2011-10-12 DIAGNOSIS — G4733 Obstructive sleep apnea (adult) (pediatric): Secondary | ICD-10-CM

## 2011-10-12 DIAGNOSIS — Z950 Presence of cardiac pacemaker: Secondary | ICD-10-CM

## 2011-10-12 NOTE — Telephone Encounter (Signed)
Pt request referral for cardiologist and sleep apnea dr in [place] group. Pt had been seeing Dr Mayford Knife and Anne Fu in [place] group. Pt last seen 06/12/11. Pt taking Cpap to [facility] today because machine was not working last night; pt did not sleep well and this AM pt said feels like heart is "twitching". Pt not having chest pain, no difficulty breathing, no irregular heart beat BP 129/72 pulse 60. Pt does not want appt pt said if his heart bothers him he will go to ER. Pt can be reached at [phone].

## 2011-10-13 NOTE — Telephone Encounter (Signed)
Patient advised and says he is not having any symptoms like that at all.  He said Asher Muir had left him a voicemail earlier in the day and he had not had a chance to get back with her.

## 2011-10-13 NOTE — Telephone Encounter (Signed)
Referrals are in.  If he is having chest pain or SOB, then to ER.  Thanks.

## 2011-11-15 ENCOUNTER — Institutional Professional Consult (permissible substitution): Payer: Self-pay | Admitting: Pulmonary Disease

## 2011-12-08 ENCOUNTER — Telehealth: Payer: Self-pay | Admitting: Internal Medicine

## 2011-12-08 NOTE — Telephone Encounter (Signed)
Forward 13 pages to Dr. Stan Head for review on 12-08-11 ym

## 2011-12-27 ENCOUNTER — Institutional Professional Consult (permissible substitution): Payer: Self-pay | Admitting: Pulmonary Disease

## 2011-12-27 ENCOUNTER — Institutional Professional Consult (permissible substitution): Payer: Self-pay | Admitting: Internal Medicine

## 2012-01-08 ENCOUNTER — Ambulatory Visit (INDEPENDENT_AMBULATORY_CARE_PROVIDER_SITE_OTHER): Payer: BC Managed Care – PPO | Admitting: Family Medicine

## 2012-01-08 ENCOUNTER — Encounter: Payer: Self-pay | Admitting: Family Medicine

## 2012-01-08 VITALS — BP 132/80 | HR 88 | Temp 98.1°F | Wt 346.0 lb

## 2012-01-08 DIAGNOSIS — M25569 Pain in unspecified knee: Secondary | ICD-10-CM

## 2012-01-08 DIAGNOSIS — M25562 Pain in left knee: Secondary | ICD-10-CM

## 2012-01-08 DIAGNOSIS — L989 Disorder of the skin and subcutaneous tissue, unspecified: Secondary | ICD-10-CM

## 2012-01-08 DIAGNOSIS — L821 Other seborrheic keratosis: Secondary | ICD-10-CM

## 2012-01-08 NOTE — Assessment & Plan Note (Signed)
Advised to f/u with ortho

## 2012-01-08 NOTE — Assessment & Plan Note (Signed)
Likely SK, can observe for now.  Can remove if needed in the future.

## 2012-01-08 NOTE — Patient Instructions (Addendum)
If the area gets bigger or more irritated, then we can shave it off.   Use the compression stocking for your knee and try to see orthopedics on your next visit home.   Take care.

## 2012-01-08 NOTE — Progress Notes (Signed)
Lesion on R side of the scalp.  Present for about 2-3 months.  CPAP mask may be rubbing in the area.  Occ will flake off.  Not tender.  No drainage.    Still having knee pain. Not improved in meantime.  Weight is up in meantime.  Worse if prolonged sitting and the joint will get stiff.  It will occ swell.  Has seen ortho prev; he doesn't know when he can follow up with them.  Sedentary at work.  Diet discussed.   Meds, vitals, and allergies reviewed.   ROS: See HPI.  Otherwise, noncontributory.  Scalp exam with 8 mm SK noted. Mildly irritated.   Knee not examined.

## 2012-01-09 ENCOUNTER — Encounter: Payer: Self-pay | Admitting: *Deleted

## 2012-01-09 ENCOUNTER — Telehealth: Payer: Self-pay | Admitting: Family Medicine

## 2012-01-09 ENCOUNTER — Ambulatory Visit (INDEPENDENT_AMBULATORY_CARE_PROVIDER_SITE_OTHER): Payer: BC Managed Care – PPO | Admitting: Pulmonary Disease

## 2012-01-09 ENCOUNTER — Institutional Professional Consult (permissible substitution): Payer: Self-pay | Admitting: Pulmonary Disease

## 2012-01-09 ENCOUNTER — Encounter: Payer: Self-pay | Admitting: Pulmonary Disease

## 2012-01-09 VITALS — BP 112/68 | HR 100 | Temp 98.0°F | Ht 72.0 in | Wt 347.0 lb

## 2012-01-09 DIAGNOSIS — G4733 Obstructive sleep apnea (adult) (pediatric): Secondary | ICD-10-CM

## 2012-01-09 NOTE — Progress Notes (Signed)
  Subjective:    Patient ID: Glenn Lyons, male    DOB: 05-24-1956, 56 y.o.   MRN: [id number]  HPI The patient is a 56 year old male who comes in today for management of obstructive sleep apnea.  He was first diagnosed in 2006, and tells me that he had very severe sleep apnea.  He was started on bilevel, and required very high pressures of 25/22 in order to control his events.  He currently uses a nasal mask with the addition of a chin strap, and tells me that his current machine is 54 years old.  He sleeps extremely well with the device, and denies any breakthrough snoring.  He feels rested in the mornings upon arising, and denies any alertness issues during the day with inactivity.  He has gained some weight over the years, but is unsure how much.  His Epworth sleepiness score today is zero.  Sleep Questionnaire: What time do you typically go to bed?( Between what hours) 12 midnight How long does it take you to fall asleep? immediately How many times during the night do you wake up? 0 What time do you get out of bed to start your day? 0700 Do you drive or operate heavy machinery in your occupation? No How much has your weight changed (up or down) over the past two years? (In pounds) 0 oz (0 kg) Have you ever had a sleep study before? Yes If yes, location of study? [facility] If yes, date of study? 2006 Do you currently use CPAP? Yes If so, what pressure? Do you wear oxygen at any time? No     Review of Systems  Constitutional: Negative for fever and unexpected weight change.  HENT: Positive for congestion, sneezing and trouble swallowing. Negative for ear pain, nosebleeds, sore throat, rhinorrhea, dental problem, postnasal drip and sinus pressure.   Eyes: Negative for redness and itching.  Respiratory: Negative for cough, chest tightness, shortness of breath and wheezing.   Cardiovascular: Negative for palpitations and leg swelling.  Gastrointestinal: Negative for nausea and vomiting.    Genitourinary: Negative for dysuria.  Musculoskeletal: Positive for arthralgias. Negative for joint swelling.  Skin: Negative for rash.  Neurological: Negative for headaches.  Hematological: Does not bruise/bleed easily.  Psychiatric/Behavioral: Negative for dysphoric mood. The patient is not nervous/anxious.   All other systems reviewed and are negative.       Objective:   Physical Exam Constitutional:  Morbidly obese male, no acute distress  HENT:  Nares with significant narrowing bilat, no purulence.  Oropharynx without exudate, palate and uvula are thick and elongated.   Eyes:  Perrla, eomi, no scleral icterus  Neck:  No JVD, no TMG  Cardiovascular:  Normal rate, regular rhythm, no rubs or gallops.  No murmurs        Intact distal pulses  Pulmonary :  Normal breath sounds, no stridor or respiratory distress   No rales, rhonchi, or wheezing  Abdominal:  Soft, nondistended, bowel sounds present.  No tenderness noted.   Musculoskeletal:  1+ lower extremity edema noted.  Lymph Nodes:  No cervical lymphadenopathy noted  Skin:  No cyanosis noted  Neurologic:  Alert, appropriate, moves all 4 extremities without obvious deficit.          Assessment & Plan:

## 2012-01-09 NOTE — Telephone Encounter (Signed)
Done. Send to patient. 

## 2012-01-09 NOTE — Assessment & Plan Note (Signed)
The patient has a history of severe obstructive sleep apnea, and has done very well on bilevel since his diagnosis.  He feels that he is sleeping well, and has no daytime alertness issues.  He has an aged machine that will need to be replaced, and I have also encouraged him to work aggressively on weight loss.  If he is doing well, we'll see him back in one year.

## 2012-01-09 NOTE — Telephone Encounter (Signed)
Pt is needing a Temporary handi-cap sticker for his knee problems.

## 2012-01-09 NOTE — Patient Instructions (Addendum)
Will arrange for a new bilevel machine. Work on weight loss followup with me in one year if doing well.

## 2012-01-09 NOTE — Telephone Encounter (Signed)
Patient advised and requested that it be mailed.

## 2012-01-30 ENCOUNTER — Ambulatory Visit (INDEPENDENT_AMBULATORY_CARE_PROVIDER_SITE_OTHER): Payer: BC Managed Care – PPO | Admitting: Family Medicine

## 2012-01-30 ENCOUNTER — Encounter: Payer: Self-pay | Admitting: Family Medicine

## 2012-01-30 VITALS — BP 122/74 | HR 64 | Temp 98.5°F | Wt 350.0 lb

## 2012-01-30 DIAGNOSIS — L909 Atrophic disorder of skin, unspecified: Secondary | ICD-10-CM

## 2012-01-30 DIAGNOSIS — L918 Other hypertrophic disorders of the skin: Secondary | ICD-10-CM

## 2012-01-30 DIAGNOSIS — R609 Edema, unspecified: Secondary | ICD-10-CM

## 2012-01-30 NOTE — Progress Notes (Signed)
BLE edema, noted by patient when he was sitting.  D/w pt about his weight and relation to edema.  Sleeping well.  Not SOB.  No orthopnea.    Skin tags.  4 in R axilla and 7 on L axilla.  See below.    Meds, vitals, and allergies reviewed.   ROS: See HPI.  Otherwise, noncontributory.  nad ncat rrr ctab BLE with 1+ edema 4 in R axilla and 7 on L axilla, irritated.   Indication: irritated skin tags  Location: as above  Size: each <1cm  Informed consent obtained.  Pt aware of risks not limited to but including infection, bleeding, damage to near by organs.  Prep: etoh  Anesthesia: 1%lidocaine with epi, good effect  Each lesion removed with scissors  Minimal oozing, controlled with silver nitrate  Tolerated well  Routine postprocedure instructions d/w pt- keep area clean and bandaged, follow up if concerns/spreading erythema/pain.

## 2012-01-30 NOTE — Patient Instructions (Addendum)
Keep your legs up as much as possible and wear the compression stockings.  Keep the skin tag sites clean and covered.  You can use neosporin for a few days.  Notify me if any spreading redness or draining pus.  Take care.

## 2012-01-31 DIAGNOSIS — R609 Edema, unspecified: Secondary | ICD-10-CM | POA: Insufficient documentation

## 2012-01-31 DIAGNOSIS — L918 Other hypertrophic disorders of the skin: Secondary | ICD-10-CM | POA: Insufficient documentation

## 2012-01-31 NOTE — Assessment & Plan Note (Signed)
Lungs clear.  Likely exacerbated by obesity.  Needs to lose weight.  Discussed.

## 2012-01-31 NOTE — Assessment & Plan Note (Signed)
Removed w/o complication.  F/u prn.  He agrees.  Routine post procedure instructions given, understood.

## 2012-02-20 ENCOUNTER — Encounter: Payer: Self-pay | Admitting: Family Medicine

## 2012-02-20 ENCOUNTER — Ambulatory Visit (INDEPENDENT_AMBULATORY_CARE_PROVIDER_SITE_OTHER): Payer: BC Managed Care – PPO | Admitting: Family Medicine

## 2012-02-20 VITALS — BP 110/72 | HR 54 | Temp 97.8°F | Wt 349.8 lb

## 2012-02-20 DIAGNOSIS — Z23 Encounter for immunization: Secondary | ICD-10-CM

## 2012-02-20 DIAGNOSIS — N529 Male erectile dysfunction, unspecified: Secondary | ICD-10-CM

## 2012-02-20 MED ORDER — SILDENAFIL CITRATE 100 MG PO TABS: 50.0000 mg | ORAL_TABLET | Freq: Every day | ORAL | Status: DC | PRN

## 2012-02-20 NOTE — Patient Instructions (Addendum)
Try the viagra, 1/2 tab at a time and see if that helps. Take care.

## 2012-02-20 NOTE — Progress Notes (Signed)
Knee pain is better.  Was able to play golf recently.    ED.  Wasn't having symptoms in the June/early July, was able to function.  Now his function isn't as good, can get some erection but overall decreased.  Dec in AM erections.  Able to climax.  Libido is still good but he is concerned about function.  Known OSA with a pacer.    No CP, not SOB, trace edema at baseline per patient.   Meds, vitals, and allergies reviewed.   ROS: See HPI.  Otherwise, noncontributory.  Overweight nad ncat rrr ctab abd soft

## 2012-02-21 DIAGNOSIS — N529 Male erectile dysfunction, unspecified: Secondary | ICD-10-CM | POA: Insufficient documentation

## 2012-02-21 NOTE — Assessment & Plan Note (Signed)
Start viagra, routine cautions.  Understood.  Needs to lose weight.  Understood.  F/u prn.

## 2012-07-17 ENCOUNTER — Encounter: Payer: Self-pay | Admitting: Internal Medicine

## 2012-07-17 ENCOUNTER — Ambulatory Visit (INDEPENDENT_AMBULATORY_CARE_PROVIDER_SITE_OTHER): Payer: BC Managed Care – PPO | Admitting: Internal Medicine

## 2012-07-17 VITALS — BP 130/85 | HR 63 | Ht 72.0 in | Wt 347.8 lb

## 2012-07-17 DIAGNOSIS — R001 Bradycardia, unspecified: Secondary | ICD-10-CM | POA: Insufficient documentation

## 2012-07-17 DIAGNOSIS — R609 Edema, unspecified: Secondary | ICD-10-CM

## 2012-07-17 DIAGNOSIS — I498 Other specified cardiac arrhythmias: Secondary | ICD-10-CM

## 2012-07-17 DIAGNOSIS — Z950 Presence of cardiac pacemaker: Secondary | ICD-10-CM

## 2012-07-17 LAB — PACEMAKER DEVICE OBSERVATION
AL AMPLITUDE: 5 mv
AL IMPEDENCE PM: 598 Ohm
AL THRESHOLD: 0.75 V
ATRIAL PACING PM: 57
BAMS-0001: 180 {beats}/min
BAMS-0003: 70 {beats}/min
BATTERY VOLTAGE: 2.78 V
DEVICE MODEL PM: 1622388
RV LEAD AMPLITUDE: 11 mv
RV LEAD THRESHOLD: 0.75 V
VENTRICULAR PACING PM: 1

## 2012-07-17 NOTE — Progress Notes (Signed)
/.  kf Patient Care Team: Joaquim Nam, MD as PCP - General   HPI  Glenn Lyons is a 57 y.o. male Seen to establish pacemaker followup. This was implanted about 5 years ago following presentation to his local hospital with chest pain. He was given morphine and in the context of significant sleep apnea he was noted to have a pause. He underwent pacing. He also has treated sleep apnea which has been markedly helpful. He denies chest pains or shortness of breath. There has been no syncope. He struggles with his weight.  He is moved to this area and has been followed by Deboraha Sprang for some years. His PCP moved to [facility] in his transfer his cardiac care to [facility] as well.   Past Medical History  Diagnosis Date  . Osteoarthrosis, unspecified whether generalized or localized, unspecified site   . Achalasia     with prev eval at [facility].  No intervention as of 2012  . Cardiac pacemaker in situ   . Depressive disorder, not elsewhere classified   . Esophageal reflux   . Unspecified sleep apnea   . Osteoarthrosis, unspecified whether generalized or localized, unspecified site   . Stricture and stenosis of esophagus   . Personal history of unspecified circulatory disease   . Backache, unspecified   . Morbid obesity     Past Surgical History  Procedure Laterality Date  . Pacemaker insertion    . Colonoscopy w/ polypectomy  11/01/2010  . Esophageal dilation      Current Outpatient Prescriptions  Medication Sig Dispense Refill  . aspirin 81 MG tablet Take 81 mg by mouth daily.        . Camphor-Menthol-Methyl Sal (SALONPAS) 1.2-5.7-6.3 % PTCH Apply topically.      . cetirizine (ZYRTEC) 10 MG tablet Take 10 mg by mouth daily as needed.      Marland Kitchen ibuprofen (ADVIL,MOTRIN) 200 MG tablet Take 200 mg by mouth as needed.        . Pediatric Multivit-Minerals-C (RA GUMMY VITAMINS & MINERALS PO) Take by mouth every other day.      . sildenafil (VIAGRA) 100 MG tablet Take 0.5-1 tablets (50-100 mg total) by mouth  daily as needed for erectile dysfunction.  10 tablet  12  . VITAMIN D, CHOLECALCIFEROL, PO Take by mouth. Take 1000 units daily        No current facility-administered medications for this visit.    Allergies  Allergen Reactions  . Morphine     REACTION: Heart stopped.    Review of Systems negative except from HPI and PMH  Physical Exam BP 130/85  Pulse 63  Ht 6' (1.829 m)  Wt 347 lb 12.8 oz (157.761 kg)  BMI 47.16 kg/m2 Well developed and well nourished in no acute distress HENT normal E scleral and icterus clear Neck Supple JVP flat; carotids brisk and full Clear to ausculation Regular rate and rhythm, no murmurs gallops or rub Soft with active bowel sounds No clubbing cyanosis 1+ Edema Alert and oriented, grossly normal motor and sensory function Skin Warm and Dry  ECG demonstrates sinus rhythm at 64 Interval 17/11/39 Axis is leftward at -3 Otherwise normal  Assessment and  Plan

## 2012-07-17 NOTE — Assessment & Plan Note (Signed)
40% atrial pacing at a lower rate limit. He is reasonable heart rate excursion

## 2012-07-17 NOTE — Patient Instructions (Addendum)
Your physician wants you to follow-up in: 6 months with Kristin/Paula for a device check & 1 year with Dr. Klein. You will receive a reminder letter in the mail two months in advance. If you don't receive a letter, please call our office to schedule the follow-up appointment.  Your physician recommends that you continue on your current medications as directed. Please refer to the Current Medication list given to you today.  

## 2012-07-17 NOTE — Assessment & Plan Note (Signed)
Chronic. He thinks it relates to his diet.

## 2012-07-17 NOTE — Assessment & Plan Note (Signed)
.  dfnb The patient's device was interrogated.  The information was reviewed. No changes were made in the programming.    

## 2012-07-30 ENCOUNTER — Encounter: Payer: Self-pay | Admitting: Internal Medicine

## 2012-09-09 ENCOUNTER — Ambulatory Visit: Payer: Self-pay | Admitting: Pulmonary Disease

## 2012-09-23 ENCOUNTER — Ambulatory Visit: Payer: Self-pay | Admitting: Pulmonary Disease

## 2012-09-30 ENCOUNTER — Encounter: Payer: Self-pay | Admitting: Pulmonary Disease

## 2012-09-30 ENCOUNTER — Ambulatory Visit (INDEPENDENT_AMBULATORY_CARE_PROVIDER_SITE_OTHER): Payer: BC Managed Care – PPO | Admitting: Pulmonary Disease

## 2012-09-30 VITALS — BP 122/62 | HR 60 | Temp 97.2°F | Ht 72.0 in | Wt 345.0 lb

## 2012-09-30 DIAGNOSIS — G4733 Obstructive sleep apnea (adult) (pediatric): Secondary | ICD-10-CM

## 2012-09-30 NOTE — Assessment & Plan Note (Signed)
The patient is doing very well with his bilevel machine, although he is overdue for a replacement device.  I will send disorder to his medical equipment company, and I've also encouraged him to keep up with mask changes and supplies.  Finally, I have also asked him to work aggressively on weight loss.

## 2012-09-30 NOTE — Patient Instructions (Addendum)
Will see what we have to do to get you a new machine.  If possible? Work on weight loss followup with me in one year if doing well.

## 2012-09-30 NOTE — Progress Notes (Signed)
  Subjective:    Patient ID: Glenn Lyons, male    DOB: 1955-12-26, 57 y.o.   MRN: [id number]  HPI The patient comes in today for followup of his obstructive sleep apnea.  He is wearing CPAP compliantly, and is having no issues with his mask fit or pressure.  He feels that he is sleeping well with that device, and is happy with his daytime alertness.  His current bilevel machine is over 55 years old, and probably needs to be replaced.   Review of Systems  Constitutional: Negative for fever and unexpected weight change.  HENT: Negative for ear pain, nosebleeds, congestion, sore throat, rhinorrhea, sneezing, trouble swallowing, dental problem, postnasal drip and sinus pressure.        Allergies  Eyes: Negative for redness and itching.  Respiratory: Negative for cough, chest tightness, shortness of breath and wheezing.   Cardiovascular: Negative for palpitations and leg swelling.  Gastrointestinal: Negative for nausea and vomiting.  Genitourinary: Negative for dysuria.  Musculoskeletal: Negative for joint swelling.  Skin: Negative for rash.  Neurological: Negative for headaches.  Hematological: Does not bruise/bleed easily.  Psychiatric/Behavioral: Negative for dysphoric mood. The patient is not nervous/anxious.        Objective:   Physical Exam Obese male in no acute distress Nose without purulence or discharge noted No skin breakdown or pressure necrosis from the CPAP mask Neck without lymphadenopathy or thyromegaly Lower extremities with mild edema, no cyanosis Alert and oriented, moves all 4 extremities.  Does not appear to be sleepy.       Assessment & Plan:

## 2012-10-02 ENCOUNTER — Encounter: Payer: Self-pay | Admitting: Pulmonary Disease

## 2012-10-14 ENCOUNTER — Telehealth: Payer: Self-pay | Admitting: Pulmonary Disease

## 2012-10-15 NOTE — Telephone Encounter (Signed)
Order was sent to [facility] on 09/30/12. Had to locate previous studies. Patient provided the interpretation of the studies, however, we needed the graphs and had to request them from [facility]. Once studies were received we notified [facility]  Called and left a message for Melissa with [facility] to please check on status of this order. Waiting on response. Rhonda J Cobb

## 2012-10-15 NOTE — Telephone Encounter (Signed)
LMOAM for pt to return my call. [facility] left message on pt's voice mail on 10/01/12 trying to arrange appointment for pt to bring in current machine to check to make sure it is working correctly. Melissa with [facility] has sent message for the RT Dept to try calling him again today to try to arrange appointment. Rhonda J Cobb

## 2012-10-15 NOTE — Telephone Encounter (Signed)
Patient returned my call and I advised patient that [facility] had been trying to get in touch with him to check his BiPAP to make sure it is working properly. Explained to patient that his insurance will not replace BiPAP unless BiPAP is not working properly, insurance will not replace just because it is an older model. Patient understands this and is willing to arrange appointment with [facility] to have his machine checked. Contacted Melissa with [facility] and asked her to ask the Resp. Dept at [facility] to contact patient again and to schedule an appointment on a Monday between 8 am and 2 pm. Also advised [facility] that patient will need a loaner BiPAP set on same pressure if patient needs to leave his machine. Pt is aware and to contact us if he has any additional needs or concerns. Rhonda J Cobb

## 2012-10-15 NOTE — Telephone Encounter (Signed)
Notified [email or web address]  to see what the status is Tobe Sos

## 2012-11-18 ENCOUNTER — Encounter: Payer: Self-pay | Admitting: Family Medicine

## 2012-11-18 ENCOUNTER — Ambulatory Visit (INDEPENDENT_AMBULATORY_CARE_PROVIDER_SITE_OTHER): Payer: BC Managed Care – PPO | Admitting: Family Medicine

## 2012-11-18 VITALS — BP 144/80 | HR 73 | Temp 97.8°F | Wt 348.5 lb

## 2012-11-18 DIAGNOSIS — R609 Edema, unspecified: Secondary | ICD-10-CM

## 2012-11-18 DIAGNOSIS — R9389 Abnormal findings on diagnostic imaging of other specified body structures: Secondary | ICD-10-CM

## 2012-11-18 DIAGNOSIS — R918 Other nonspecific abnormal finding of lung field: Secondary | ICD-10-CM

## 2012-11-18 MED ORDER — FUROSEMIDE 20 MG PO TABS: 20.0000 mg | ORAL_TABLET | Freq: Every day | ORAL | Status: DC

## 2012-11-18 NOTE — Patient Instructions (Addendum)
Take the lasix sparingly.  Use as little as possible.  Wear your stockings, elevate your legs, and work on losing weight.  Recheck CXR in about 1 month.  Schedule a lab visit for the CXR.  Take care.

## 2012-11-18 NOTE — Progress Notes (Signed)
Prev with HA, inc in BLE edema.  Seen at ER in [place] after plane flight.  Labs unremarkable at ER.  CXR results noted.  No DVT on u/s in [place].  He felt better after using lasix.  Still with some edema in BLE.  Wearing compression stockings at baseline.  Here for routine f/u after ER visit.    Meds, vitals, and allergies reviewed.   ROS: See HPI.  Otherwise, noncontributory.  GEN: nad, alert and oriented, obese HEENT: mucous membranes moist NECK: supple w/o LA CV: rrr.  no murmur PULM: ctab, no inc wob ABD: soft, +bs EXT: 1+ BLE edema SKIN: no acute rash   Prev CXR report: Impression: 1. Mild central vascular prominence.  2. Retrocardiac density not definitively defined. This may only represent an obscure hiatal hernia but interval reassessment or routine followup recommended.

## 2012-11-19 NOTE — Assessment & Plan Note (Signed)
H/o, worse recently.  Improved some with lasix.  D/w pt about weight mgmt.  This appears to be the main issue.  This doesn't appear to be an acute cardiac event.  He needs to lose weight, wear stockings, limit salt, using lasix sparingly in meantime.    CXR finding likely related to achalasia and this isn't alarming, but I would recheck in about 1 month.  He agrees.  D/w pt.  Old records reviewed from ER in [place].

## 2012-11-21 ENCOUNTER — Encounter: Payer: Self-pay | Admitting: Internal Medicine

## 2012-12-15 ENCOUNTER — Other Ambulatory Visit: Payer: Self-pay | Admitting: Family Medicine

## 2012-12-16 ENCOUNTER — Ambulatory Visit (INDEPENDENT_AMBULATORY_CARE_PROVIDER_SITE_OTHER)
Admission: RE | Admit: 2012-12-16 | Discharge: 2012-12-16 | Disposition: A | Discharge status: Home / Self Care | Payer: BC Managed Care – PPO | Source: Ambulatory Visit | Attending: Family Medicine | Admitting: Family Medicine

## 2012-12-16 DIAGNOSIS — R9389 Abnormal findings on diagnostic imaging of other specified body structures: Secondary | ICD-10-CM

## 2012-12-16 DIAGNOSIS — R918 Other nonspecific abnormal finding of lung field: Secondary | ICD-10-CM

## 2012-12-16 NOTE — Telephone Encounter (Signed)
Yes, was to use as little as possible.  Sent.  If still needing frequently, he'll need f/u visit for recheck.  Thanks .

## 2012-12-16 NOTE — Telephone Encounter (Signed)
Is there a reason why he was only given #30 at his last OV?

## 2012-12-31 ENCOUNTER — Telehealth: Payer: Self-pay | Admitting: Family Medicine

## 2012-12-31 MED ORDER — FUROSEMIDE 20 MG PO TABS: ORAL_TABLET | ORAL | Status: DC

## 2012-12-31 NOTE — Telephone Encounter (Signed)
He needs to use as little as possible.  Rx sent.  This is not a long term solution.  Since still needing frequently, he'll need f/u visit for recheck and discuss options.

## 2012-12-31 NOTE — Telephone Encounter (Signed)
Pt's wife left vm stating that pt has been increasing furosemide 20mg  to 2 tabs/day (from 1 tab/day) and he needs a refill, he has 1 week of medication left.  Please advise.

## 2013-01-01 NOTE — Telephone Encounter (Signed)
Patient and his wife notified as instructed by telephone. Follow-up appointment scheduled.

## 2013-01-06 ENCOUNTER — Ambulatory Visit: Payer: Self-pay | Admitting: Pulmonary Disease

## 2013-01-06 ENCOUNTER — Ambulatory Visit: Payer: Self-pay | Admitting: Family Medicine

## 2013-01-08 ENCOUNTER — Ambulatory Visit (INDEPENDENT_AMBULATORY_CARE_PROVIDER_SITE_OTHER): Payer: BC Managed Care – PPO | Admitting: *Deleted

## 2013-01-08 DIAGNOSIS — R001 Bradycardia, unspecified: Secondary | ICD-10-CM

## 2013-01-08 DIAGNOSIS — I498 Other specified cardiac arrhythmias: Secondary | ICD-10-CM

## 2013-01-08 LAB — PACEMAKER DEVICE OBSERVATION
AL AMPLITUDE: 5 mv
AL IMPEDENCE PM: 608 Ohm
AL THRESHOLD: 1 V
ATRIAL PACING PM: 46
BAMS-0001: 180 {beats}/min
BAMS-0003: 70 {beats}/min
BATTERY VOLTAGE: 2.8 V
DEVICE MODEL PM: 1622388
RV LEAD AMPLITUDE: 10 mv
RV LEAD THRESHOLD: 0.75 V
VENTRICULAR PACING PM: 1

## 2013-01-08 NOTE — Progress Notes (Signed)
Dual chamber ppm in clinic. Normal device function. Battery longevity 4.50 to 6.75 years. No changes made. ROV in 6 mths w/SK.

## 2013-02-01 ENCOUNTER — Encounter: Payer: Self-pay | Admitting: Internal Medicine

## 2013-02-03 ENCOUNTER — Other Ambulatory Visit: Payer: Self-pay

## 2013-02-03 MED ORDER — FUROSEMIDE 20 MG PO TABS: ORAL_TABLET | ORAL | Status: DC

## 2013-02-03 NOTE — Telephone Encounter (Signed)
Pt s wife Okey Regal left v/m requesting refill furosemide to [facility]; while pt is on medication ankles are not swelling; [place] and request refill furosemide until appt scheduled 02/07/13.Please advise.12/31/12 note said to take as little of furosemide as possible; is it OK to refill?

## 2013-02-03 NOTE — Telephone Encounter (Signed)
Sent, use sparingly.

## 2013-02-07 ENCOUNTER — Ambulatory Visit (INDEPENDENT_AMBULATORY_CARE_PROVIDER_SITE_OTHER): Payer: BC Managed Care – PPO | Admitting: Family Medicine

## 2013-02-07 ENCOUNTER — Encounter: Payer: Self-pay | Admitting: Family Medicine

## 2013-02-07 VITALS — BP 116/66 | HR 72 | Temp 98.0°F | Wt 349.2 lb

## 2013-02-07 DIAGNOSIS — M25512 Pain in left shoulder: Secondary | ICD-10-CM

## 2013-02-07 DIAGNOSIS — M25519 Pain in unspecified shoulder: Secondary | ICD-10-CM

## 2013-02-07 DIAGNOSIS — R609 Edema, unspecified: Secondary | ICD-10-CM

## 2013-02-07 NOTE — Patient Instructions (Addendum)
Don't take more than 1 lasix per day.  Go to the lab on the way out.  We'll contact you with your lab report. Take care.

## 2013-02-07 NOTE — Progress Notes (Signed)
L shoulder pain, episodic, positional.    Edema in BLE. Recently on lasix for the edema.  Taking 20mg  a day now.   Less edema now compared to 6/14.  Using stockings.  The fluid is pushed up with stockings, to his knees. He realizes this is likely at least partially ir not mostly weight related. Edema is better on the medicine.  Weight is similar to prev.  His diet isn't improved; fast food on the go.    He'll be working in [place] soon, can travel home for weekends.    Meds, vitals, and allergies reviewed.   ROS: See HPI.  Otherwise, noncontributory.  nad Morbidly obese rrr ctab abd soft Trace edema in BLE  DP pulse intact Supraspinatus testing positive on L shoulder. +impingement.  No arm drop.

## 2013-02-08 DIAGNOSIS — M25519 Pain in unspecified shoulder: Secondary | ICD-10-CM | POA: Insufficient documentation

## 2013-02-08 LAB — BASIC METABOLIC PANEL
BUN: 10 mg/dL (ref 6–23)
CO2: 28 mEq/L (ref 19–32)
Calcium: 8.9 mg/dL (ref 8.4–10.5)
Chloride: 104 mEq/L (ref 96–112)
Creat: 0.93 mg/dL (ref 0.50–1.35)
Glucose, Bld: 107 mg/dL — ABNORMAL HIGH (ref 70–99)
Potassium: 4.1 mEq/L (ref 3.5–5.3)
Sodium: 140 mEq/L (ref 135–145)

## 2013-02-08 NOTE — Assessment & Plan Note (Signed)
Try to limit lasix, use prn.  He needs to lose weight.  This is the root issue here.  He knows it.  Support offered.  It won't improve until his diet changes.  Continue stockings in meantime.  See notes on labs.

## 2013-02-08 NOTE — Assessment & Plan Note (Signed)
Cuff impingement, d/w pt about ROM exercise and call back if wants to go to ortho.

## 2013-03-03 ENCOUNTER — Other Ambulatory Visit: Payer: Self-pay | Admitting: Family Medicine

## 2013-03-04 ENCOUNTER — Telehealth: Payer: Self-pay | Admitting: Internal Medicine

## 2013-03-04 NOTE — Telephone Encounter (Signed)
Left message for patient to call back  

## 2013-03-05 NOTE — Telephone Encounter (Signed)
Left message for patient to call back  

## 2013-03-06 NOTE — Telephone Encounter (Signed)
Left message for patient to call back  

## 2013-03-07 NOTE — Telephone Encounter (Signed)
No return call from the patient.  I will await a call back from the patient

## 2013-03-10 ENCOUNTER — Encounter: Payer: Self-pay | Admitting: Family Medicine

## 2013-03-10 ENCOUNTER — Ambulatory Visit (INDEPENDENT_AMBULATORY_CARE_PROVIDER_SITE_OTHER): Payer: BC Managed Care – PPO | Admitting: Family Medicine

## 2013-03-10 VITALS — BP 110/68 | HR 65 | Temp 97.9°F | Wt 356.0 lb

## 2013-03-10 DIAGNOSIS — Z23 Encounter for immunization: Secondary | ICD-10-CM

## 2013-03-10 DIAGNOSIS — F419 Anxiety disorder, unspecified: Secondary | ICD-10-CM | POA: Insufficient documentation

## 2013-03-10 DIAGNOSIS — R4586 Emotional lability: Secondary | ICD-10-CM | POA: Insufficient documentation

## 2013-03-10 DIAGNOSIS — F411 Generalized anxiety disorder: Secondary | ICD-10-CM | POA: Insufficient documentation

## 2013-03-10 MED ORDER — CITALOPRAM HYDROBROMIDE 20 MG PO TABS: 20.0000 mg | ORAL_TABLET | Freq: Every day | ORAL | Status: DC

## 2013-03-10 NOTE — Progress Notes (Signed)
He was off lasix for 2 days.  At that point he was feeling slightly flushed and had a HA.  The flushing got some better in the meantime. He also had gotten up at 4AM to drive up to [facility] that day.  He slept most of the next day. Frontal HA today, mild, not throbbing.    He was concerned about getting depressed. Wife has noted it before and wife thinks this was similar, "like it is creeping up."  Being away from home was a strain for him and his last few jobs were tough.  He told his boss in [place] "I'm done" and walked out.  He then got to the job in [place] but this hasn't been a huge improvement.  He dreads going into work.  He is anxious about going to work.  We talked about options.  He is more irritable. Not physically violent but less tolerant of irritation. His face got redder talking about the job situation while here in the clinic.   He has f/u pending with GI about achalasia.   Meds, vitals, and allergies reviewed.   ROS: See HPI.  Otherwise, noncontributory.  nad obese ncat Mmm rrr ctab Ext w/o edema His face got redder talking about the job situation.   Speech and affect wnl o/w.

## 2013-03-10 NOTE — Patient Instructions (Signed)
Shirlee Limerick will call about your referral. I would start back on the citalopram in the meantime.  Take care.

## 2013-03-10 NOTE — Assessment & Plan Note (Signed)
Likely exacerbated by work strain.  With irritability.  Prev improved on celexa.  Restart.  FH of improvement on celexa noted, daughter.  No SI/HI.  Okay for outpatient f/u.  Counseling is reasonable, encouraged.  Would likely need help with addressing relationship with family, work, and food.  He agrees.  >25 min spent with face to face with patient, >50% counseling and/or coordinating care.

## 2013-03-11 ENCOUNTER — Telehealth: Payer: Self-pay | Admitting: Pulmonary Disease

## 2013-03-11 DIAGNOSIS — G4733 Obstructive sleep apnea (adult) (pediatric): Secondary | ICD-10-CM

## 2013-03-11 NOTE — Telephone Encounter (Signed)
According to last visit KC placed order to try to get the pt an new bipap machine. There was some issues getting in contact with the pt for [facility] to check his machine. They stated the only way that his machine could be replaced was if it was not working properly. According to [facility] with [facility] the machine is not working properly so asked that we place a new order for a new bipap because previous one has expired. Order placed. Carron Curie, CMA

## 2013-03-12 NOTE — Telephone Encounter (Signed)
Patient's wife called back today.  I have left another message for the patient to call back.

## 2013-03-12 NOTE — Telephone Encounter (Signed)
Patient is rescheduled to see Dr. Leone Payor 03/21/13 10;45

## 2013-03-19 ENCOUNTER — Ambulatory Visit (INDEPENDENT_AMBULATORY_CARE_PROVIDER_SITE_OTHER): Payer: BC Managed Care – PPO | Admitting: Psychology

## 2013-03-19 DIAGNOSIS — F331 Major depressive disorder, recurrent, moderate: Secondary | ICD-10-CM

## 2013-03-19 DIAGNOSIS — F411 Generalized anxiety disorder: Secondary | ICD-10-CM

## 2013-03-21 ENCOUNTER — Ambulatory Visit: Payer: Self-pay | Admitting: Internal Medicine

## 2013-03-24 ENCOUNTER — Telehealth: Payer: Self-pay | Admitting: Pulmonary Disease

## 2013-03-24 NOTE — Telephone Encounter (Signed)
Will forward to KC as an FYI 

## 2013-04-21 ENCOUNTER — Ambulatory Visit: Payer: Self-pay | Admitting: Internal Medicine

## 2013-04-29 ENCOUNTER — Ambulatory Visit (INDEPENDENT_AMBULATORY_CARE_PROVIDER_SITE_OTHER): Payer: BC Managed Care – PPO | Admitting: Internal Medicine

## 2013-04-29 ENCOUNTER — Encounter: Payer: Self-pay | Admitting: Internal Medicine

## 2013-04-29 VITALS — BP 100/64 | HR 72 | Ht 72.0 in | Wt 355.0 lb

## 2013-04-29 DIAGNOSIS — Z860100 Personal history of colon polyps, unspecified: Secondary | ICD-10-CM

## 2013-04-29 DIAGNOSIS — K220 Achalasia of cardia: Secondary | ICD-10-CM

## 2013-04-29 NOTE — Patient Instructions (Signed)
Please let me know when you are ready to schedule your colonoscopy by calling or sending a My Chart message.  Good luck with the weight loss.  I appreciate the opportunity to care for you. Iva Boop, MD, Clementeen Graham

## 2013-04-29 NOTE — Progress Notes (Signed)
   Subjective:    Patient ID: Glenn Lyons, male    DOB: 24-Oct-1955, 57 y.o.   MRN: [id number]  HPI Patient is here for followup. 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 I sent him a recall colonoscopy later this summer. He has not yet scheduled at but is willing to do so, he would like to do this in early 2015. He remains obese as well, he continues to have regurgitation and swallowing problems. He reminded he saw Dr. Carolynn Sayers at [facility] 2 years ago and Dr. Carolynn Sayers was reluctant or not willing to do surgery because of the patient's size at the time.  Wt Readings from Last 3 Encounters:  04/29/13 355 lb (161.027 kg)  03/10/13 356 lb (161.481 kg)  02/07/13 349 lb 4 oz (158.419 kg)   Medications, allergies, past medical history, past surgical history, family history and social history are reviewed and updated in the EMR.  Review of Systems As above    Objective:   Physical Exam Obese, NAD    Assessment & Plan:   1. Achalasia   2. Morbid obesity   3. Personal history of colonic polyps

## 2013-04-29 NOTE — Assessment & Plan Note (Signed)
Plan for repeat colonoscopy - he is over LEC weight limit He wants to try to lose wight and contact us in early 2015 to schedule - may still need hospital w/ co-morbidities but will see about weight loss.

## 2013-04-29 NOTE — Assessment & Plan Note (Addendum)
Understands need to lose - he plans caloric restriction. Unusual situation with obesity and achalasia.

## 2013-04-29 NOTE — Assessment & Plan Note (Signed)
Stable symptoms at this time. It is unusual that he is obese. This complicates surgical treatment. He definitely has quality of life issues with this but is probably not a surgical candidate with his obesity. He intends to lose weight.

## 2013-05-23 ENCOUNTER — Ambulatory Visit (INDEPENDENT_AMBULATORY_CARE_PROVIDER_SITE_OTHER): Payer: BC Managed Care – PPO | Admitting: Family Medicine

## 2013-05-23 ENCOUNTER — Encounter: Payer: Self-pay | Admitting: Family Medicine

## 2013-05-23 VITALS — BP 120/68 | HR 89 | Temp 98.0°F | Ht 71.0 in | Wt 362.2 lb

## 2013-05-23 DIAGNOSIS — J189 Pneumonia, unspecified organism: Secondary | ICD-10-CM

## 2013-05-23 DIAGNOSIS — R062 Wheezing: Secondary | ICD-10-CM

## 2013-05-23 MED ORDER — DOXYCYCLINE HYCLATE 100 MG PO TABS: 100.0000 mg | ORAL_TABLET | Freq: Two times a day (BID) | ORAL | Status: DC

## 2013-05-23 NOTE — Progress Notes (Signed)
Pre-visit discussion using our clinic review tool. No additional management support is needed unless otherwise documented below in the visit note.  

## 2013-05-23 NOTE — Progress Notes (Signed)
Date:  05/23/2013   Name:  Glenn Lyons   DOB:  03-11-1956   MRN:  [id number] Gender: male Age: 57 y.o.  Primary Physician:  Glenn Givens, MD   Chief Complaint: Cough   Subjective:   History of Present Illness:  Glenn Lyons is a 57 y.o. very pleasant male patient who presents with the following:  Chest congestion on Sunday, and and got some short of breath. Yesterday felt bad. He has been getting progressively worse, and he has a markedly bad cough. He also is wheezing is feeling somewhat short of breath. His cough is productive of yellowish brown sputum. He quit smoking a number years ago, and he reports being and intermittent smoker only. He smokes about a half-pack a day at most, but he would often go for weeks without any, and sometimes he would be quit for 2 or 3 years.  He also is having some dental problems, and was placed on amoxicillin 500 mg 4 times a day after a recent root canal  Right now is taking amox. 500 mg 4 times a day.  Smoked for 30 years   Past Medical History, Surgical History, Social History, Family History, Problem List, Medications, and Allergies have been reviewed and updated if relevant.  Review of Systems: ROS: GEN: Acute illness details above GI: Tolerating PO intake GU: maintaining adequate hydration and urination Pulm: No SOB Interactive and getting along well at home.  Otherwise, ROS is as per the HPI.   Objective:   Physical Examination: BP 120/68  Pulse 89  Temp(Src) 98 F (36.7 C) (Oral)  Ht 5\' 11"  (1.803 m)  Wt 362 lb 4 oz (164.316 kg)  BMI 50.55 kg/m2  SpO2 91%   GEN: A and O x 3. WDWN. NAD.    ENT: Nose clear, ext NML.  No LAD.  No JVD.  TM's clear. Oropharynx clear.  PULM: Normal WOB, no distress. Rhonchi are diffuse as are wheezes throughout B lung fields CV: RRR, no M/G/R, No rubs, No JVD.   EXT: warm and well-perfused, No c/c/e. PSYCH: Pleasant and conversant.    Laboratory and Imaging Data: Results for orders  placed in visit on 02/07/13  BASIC METABOLIC PANEL      Result Value Range   Sodium 140  135 - 145 mEq/L   Potassium 4.1  3.5 - 5.3 mEq/L   Chloride 104  96 - 112 mEq/L   CO2 28  19 - 32 mEq/L   Glucose, Bld 107 (*) 70 - 99 mg/dL   BUN 10  6 - 23 mg/dL   Creat 4.09  8.11 - 9.14 mg/dL   Calcium 8.9  8.4 - 78.2 mg/dL     Assessment & Plan:    Atypical pneumonia  Wheezing  The patient's lung examination is relatively profound and worsen I would anticipate. He appears in no respiratory distress in the office.  He is going to continue to take some amoxicillin for one more day, but at that point I would like to have him switch to doxycycline, which I think will give him some greater average for potential atypical infections.  The patient also has some severe sleep apnea. He does appear safe for outpatient management.  New medications, updates to list, dose adjustments: Meds ordered this encounter  Medications  . amoxicillin (AMOXIL) 500 MG capsule    Sig: Take 500 mg by mouth 4 (four) times daily.  Marland Kitchen doxycycline (VIBRA-TABS) 100 MG tablet    Sig: Take 1 tablet (  100 mg total) by mouth 2 (two) times daily.    Dispense:  20 tablet    Refill:  0    Signed,  Aishani Kalis T. Marcellino Fidalgo, MD, CAQ Sports Medicine  [facility] [address] [place] [address] Phone: [phone] Fax: [phone]    Medication List       This list is accurate as of: 05/23/13 11:59 PM.  Always use your most recent med list.               amoxicillin 500 MG capsule  Commonly known as:  AMOXIL  Take 500 mg by mouth 4 (four) times daily.     aspirin 81 MG tablet  Take 81 mg by mouth daily.     cetirizine 10 MG tablet  Commonly known as:  ZYRTEC  Take 20 mg by mouth daily as needed.     cholecalciferol 1000 UNITS tablet  Commonly known as:  VITAMIN D  Take 1,000 Units by mouth daily.     citalopram 20 MG tablet  Commonly known as:  CELEXA  Take 1 tablet (20 mg  total) by mouth daily.     doxycycline 100 MG tablet  Commonly known as:  VIBRA-TABS  Take 1 tablet (100 mg total) by mouth 2 (two) times daily.     furosemide 20 MG tablet  Commonly known as:  LASIX  Take 20 mg by mouth daily.     ibuprofen 200 MG tablet  Commonly known as:  ADVIL,MOTRIN  Take 200 mg by mouth as needed.     Krill Oil 1000 MG Caps  Take 1 capsule by mouth daily.

## 2013-05-26 ENCOUNTER — Ambulatory Visit: Payer: Self-pay | Admitting: Internal Medicine

## 2013-05-26 ENCOUNTER — Ambulatory Visit (INDEPENDENT_AMBULATORY_CARE_PROVIDER_SITE_OTHER): Payer: BC Managed Care – PPO | Admitting: Family Medicine

## 2013-05-26 ENCOUNTER — Encounter: Payer: Self-pay | Admitting: Family Medicine

## 2013-05-26 ENCOUNTER — Ambulatory Visit (INDEPENDENT_AMBULATORY_CARE_PROVIDER_SITE_OTHER)
Admission: RE | Admit: 2013-05-26 | Discharge: 2013-05-26 | Disposition: A | Discharge status: Home / Self Care | Payer: BC Managed Care – PPO | Source: Ambulatory Visit | Attending: Family Medicine | Admitting: Family Medicine

## 2013-05-26 VITALS — BP 112/64 | HR 76 | Temp 98.2°F | Wt 360.8 lb

## 2013-05-26 DIAGNOSIS — R05 Cough: Secondary | ICD-10-CM

## 2013-05-26 DIAGNOSIS — R059 Cough, unspecified: Secondary | ICD-10-CM | POA: Insufficient documentation

## 2013-05-26 MED ORDER — ALBUTEROL SULFATE HFA 108 (90 BASE) MCG/ACT IN AERS: 2.0000 | INHALATION_SPRAY | Freq: Four times a day (QID) | RESPIRATORY_TRACT | Status: DC | PRN

## 2013-05-26 NOTE — Assessment & Plan Note (Signed)
I would still presume he is getting enough of the abx down to be useful.  Would add on SABA and check CXR today.  If CXR is alarming with a PNA, we can broaden his abx.  He is nontoxic.  D/w pt about swallowing procedure to try to limit sx from achalasia.  SABA technique d/w pt.  Still okay for outpatient f/u. >25 min spent with face to face with patient, >50% counseling and/or coordinating care.

## 2013-05-26 NOTE — Patient Instructions (Signed)
Go to the lab on the way out.  We'll contact you with your xray report. Try to finish the amoxil and the doxycycline.  Use the inhaler for the cough and wheeze.  We may or may not need to change your antibiotics based on the chest xray.  Take care.

## 2013-05-26 NOTE — Progress Notes (Signed)
Pre-visit discussion using our clinic review tool. No additional management support is needed unless otherwise documented below in the visit note.  Recently seen in clinic, Dr. Cyndie Chime note reviewed.   H/o achalasia known.  Recently was started on amoxil, still on that per dental clinic after root canal.  More trouble with swallowing the amoxil at this point, with a capsule. He does spit up a lot and didn't know if he was getting all of the medicine into his stomach.  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  It can last for 10-15 minutes" per episode.  He isn't much better than Friday.  He is able to swallow the doxy w/o complication.  He is supposed to be on amoxil for about 1 more day.  No fevers, no ST.  Known OSA, still on CPAP, sleeping well with that.  Less cough while on CPAP.   Meds, vitals, and allergies reviewed.   ROS: See HPI.  Otherwise, noncontributory.  GEN: A and O x 3. NAD.  ENT: Nose clear, ext NML. No LAD. TM's clear. Oropharynx clear.  PULM: Normal WOB, no distress. Rhonchi are diffuse as are wheezes throughout B lung fields  CV: RRR EXT: no edema.

## 2013-06-25 ENCOUNTER — Encounter: Payer: Self-pay | Admitting: Internal Medicine

## 2013-07-03 ENCOUNTER — Telehealth: Payer: Self-pay

## 2013-07-03 NOTE — Telephone Encounter (Signed)
This far out from the preceding illness, I would presume that it was a different process.  Mult viral illnesses prevalent in community.  Give the short duration of this illness, I would use the inhaler for a few more days to help with the cough and chest congestion, notify us if not improved at that point (or if much worse in the meantime).  Thanks.

## 2013-07-03 NOTE — Telephone Encounter (Signed)
See below, I would still give this a few days.  Thanks.

## 2013-07-03 NOTE — Telephone Encounter (Signed)
Glenn Lyons left v/m; pt said pt finished doxycycline and pt's cough was better but pts cough has returned on 07/01/13; pt had 2 doxycycline left and pt took antibiotic on 07/01/13 and 07/02/13; cough seems better but pt is still having chest congestion,prod cough with ? Color of phlegm. No fever,no wheezing and no SOB.Pt has inhaler that he is using also. [facility] [place].

## 2013-07-03 NOTE — Telephone Encounter (Signed)
Mrs Celmer left v/m that she spoke with pt and pt is coughing up yellow phlegm.

## 2013-07-03 NOTE — Telephone Encounter (Signed)
Left detailed message on voicemail.  

## 2013-07-07 MED ORDER — DOXYCYCLINE HYCLATE 100 MG PO TABS: 100.0000 mg | ORAL_TABLET | Freq: Two times a day (BID) | ORAL | Status: DC

## 2013-07-07 NOTE — Telephone Encounter (Addendum)
Mrs Favorite said pt still coughing up yellow phlegm,chest congestion with wheezing, and pt beginning to feel worse and at times is SOB. no fever. Pt [facility]cycl[facility].Pt request cb.

## 2013-07-07 NOTE — Telephone Encounter (Signed)
Patient notified as instructed by telephone. 

## 2013-07-07 NOTE — Telephone Encounter (Signed)
Sent, f/u if not improved.  Thanks.  

## 2013-07-07 NOTE — Addendum Note (Signed)
Addended by: Helene Shoe on: 07/07/2013 08:08 AM   Modules accepted: Orders

## 2013-07-07 NOTE — Addendum Note (Signed)
Addended by: Tonia Ghent on: 07/07/2013 01:52 PM   Modules accepted: Orders

## 2013-07-10 ENCOUNTER — Other Ambulatory Visit: Payer: Self-pay | Admitting: Family Medicine

## 2013-07-10 NOTE — Telephone Encounter (Signed)
Sent!

## 2013-07-10 NOTE — Telephone Encounter (Signed)
Electronic refill request.  Sig says use as little as possible.  Is it okay to refill?

## 2013-07-16 ENCOUNTER — Ambulatory Visit (INDEPENDENT_AMBULATORY_CARE_PROVIDER_SITE_OTHER): Payer: BC Managed Care – PPO | Admitting: Family Medicine

## 2013-07-16 ENCOUNTER — Ambulatory Visit (INDEPENDENT_AMBULATORY_CARE_PROVIDER_SITE_OTHER)
Admission: RE | Admit: 2013-07-16 | Discharge: 2013-07-16 | Disposition: A | Discharge status: Home / Self Care | Payer: BC Managed Care – PPO | Source: Ambulatory Visit | Attending: Family Medicine | Admitting: Family Medicine

## 2013-07-16 ENCOUNTER — Encounter: Payer: Self-pay | Admitting: Family Medicine

## 2013-07-16 VITALS — BP 122/70 | HR 64 | Temp 98.6°F | Wt 357.8 lb

## 2013-07-16 DIAGNOSIS — R059 Cough, unspecified: Secondary | ICD-10-CM

## 2013-07-16 DIAGNOSIS — R05 Cough: Secondary | ICD-10-CM

## 2013-07-16 DIAGNOSIS — K529 Noninfective gastroenteritis and colitis, unspecified: Secondary | ICD-10-CM | POA: Insufficient documentation

## 2013-07-16 DIAGNOSIS — K5289 Other specified noninfective gastroenteritis and colitis: Secondary | ICD-10-CM

## 2013-07-16 MED ORDER — ALBUTEROL SULFATE HFA 108 (90 BASE) MCG/ACT IN AERS: 2.0000 | INHALATION_SPRAY | Freq: Four times a day (QID) | RESPIRATORY_TRACT | Status: DC | PRN

## 2013-07-16 NOTE — Patient Instructions (Signed)
Rest and fluids for now.  Avoid dairy.   Go to the lab on the way out.  We'll contact you with your xray report. We may need to change your meds based on the xray.  Use the inhaler as needed for now.

## 2013-07-16 NOTE — Progress Notes (Signed)
Pre-visit discussion using our clinic review tool. No additional management support is needed unless otherwise documented below in the visit note.  He had a recent cough.  He had some wheezing, some better now.  Still with some cough.  Sig post nasal gtt, ear congestion- better today after he got his ears to pop.  Was on doxycycline, has ~4 pills left. .  Voice is still altered some.  Hasn't used SABA recently.    Yesterday started with vomiting, chills.  Soft stools but not diarrhea.  Now wife has similar.  No blood in stool.  GI illness noted in the community recently.  H/o achalasia noted.  He has dysphagia from that at baseline.    Meds, vitals, and allergies reviewed.   ROS: See HPI.  Otherwise, noncontributory.  nad ncat Tm wnl Nasal exam slightly irritated.  OP wnl MMM Neck supple, no LA rrr No inc in wob but diffuse upper and lower L sided rhonchi, more prominent than on the R side.  No wheeze.  No SOB.  abd soft, not ttp, no rebound.

## 2013-07-16 NOTE — Assessment & Plan Note (Signed)
Supportive care.  Nontoxic.  Doesn't appear dehydrated.  Should resolve.

## 2013-07-16 NOTE — Assessment & Plan Note (Signed)
Given the chest exam, recheck CXR.  He agrees. We may need to change abx based on the CXR.  Not in distress. Unclear is achalasia is playing a role here.  D/w pt.

## 2013-09-15 ENCOUNTER — Ambulatory Visit: Payer: Self-pay | Admitting: Family Medicine

## 2013-09-16 ENCOUNTER — Encounter: Payer: Self-pay | Admitting: Family Medicine

## 2013-09-16 ENCOUNTER — Ambulatory Visit (INDEPENDENT_AMBULATORY_CARE_PROVIDER_SITE_OTHER): Payer: BC Managed Care – PPO | Admitting: Family Medicine

## 2013-09-16 VITALS — BP 122/80 | HR 65 | Temp 97.7°F | Wt 360.5 lb

## 2013-09-16 DIAGNOSIS — M25519 Pain in unspecified shoulder: Secondary | ICD-10-CM

## 2013-09-16 NOTE — Assessment & Plan Note (Signed)
Discussed pendulum exercises and refer to ortho. Anatomy d/w pt.  He agrees.

## 2013-09-16 NOTE — Progress Notes (Signed)
Pre visit review using our clinic review tool, if applicable. No additional management support is needed unless otherwise documented below in the visit note.  L shoulder pain is getting worse.  Ibuprofen would help some but then it is worse in the meantime.  Can't sleep on the L side.  Pain with getting L arm above 90 deg.  Pain with overhead movement, reaching out.    L sided chest/abd wall pain prev after a sneeze, resolved now.    Meds, vitals, and allergies reviewed.   ROS: See HPI.  Otherwise, noncontributory.  nad ncat Supraspinatus testing positive on L shoulder. Pain with int/ext rotation. +impingement. No arm drop.  + scap assist on L shoulder.

## 2013-09-16 NOTE — Patient Instructions (Signed)
Pendulum exercise in the meantime.  Ask to see Rosaria Ferries on the way out- tell her you are leaving town soon. Take care.

## 2013-09-24 ENCOUNTER — Encounter: Payer: Self-pay | Admitting: Pulmonary Disease

## 2013-10-06 ENCOUNTER — Ambulatory Visit: Payer: Self-pay | Admitting: Pulmonary Disease

## 2013-10-07 ENCOUNTER — Encounter: Payer: Self-pay | Admitting: Internal Medicine

## 2013-10-07 ENCOUNTER — Ambulatory Visit (INDEPENDENT_AMBULATORY_CARE_PROVIDER_SITE_OTHER): Payer: BC Managed Care – PPO | Admitting: Internal Medicine

## 2013-10-07 VITALS — BP 122/70 | HR 68 | Temp 98.5°F | Wt 354.5 lb

## 2013-10-07 DIAGNOSIS — J029 Acute pharyngitis, unspecified: Secondary | ICD-10-CM

## 2013-10-07 DIAGNOSIS — J309 Allergic rhinitis, unspecified: Secondary | ICD-10-CM

## 2013-10-07 NOTE — Progress Notes (Signed)
Subjective:    Patient ID: Glenn Lyons, male    DOB: [birth year], 58 y.o.   MRN: [id number]  HPI  Pt presents to the clinic today with c/o sore throat. He reports this started 4 days ago. He does have some nasal congestion with clear rhinorrhea. His wife reports that she saw some white spots on his tonsils. He denies fever, chills or body aches. He has not tried anything OTC. He has not had sick contacts  Review of Systems      Past Medical History  Diagnosis Date  . Osteoarthrosis, unspecified whether generalized or localized, unspecified site   . Achalasia     with prev eval at [facility].  No intervention as of 2012  . Cardiac pacemaker St. Jude     ERI 2008  . Depressive disorder, not elsewhere classified   . Esophageal reflux   . Obstructive sleep apnea   . Osteoarthrosis, unspecified whether generalized or localized, unspecified site   . Stricture and stenosis of esophagus   . Backache, unspecified   . Morbid obesity   . Sinus bradycardia /pauses     Current Outpatient Prescriptions  Medication Sig Dispense Refill  . albuterol (PROVENTIL HFA;VENTOLIN HFA) 108 (90 BASE) MCG/ACT inhaler Inhale 2 puffs into the lungs every 6 (six) hours as needed for wheezing (or for cough).  1 Inhaler  1  . aspirin 81 MG tablet Take 81 mg by mouth daily.        . cetirizine (ZYRTEC) 10 MG tablet Take 20 mg by mouth daily as needed.       . cholecalciferol (VITAMIN D) 1000 UNITS tablet Take 1,000 Units by mouth daily.      . citalopram (CELEXA) 20 MG tablet Take 1 tablet (20 mg total) by mouth daily.  30 tablet  5  . furosemide (LASIX) 20 MG tablet TAKE 1 TO 2 TABLETS EVERY DAY, USE AS LITTLE AS POSSIBLE.  30 tablet  3  . ibuprofen (ADVIL,MOTRIN) 200 MG tablet Take 200 mg by mouth as needed.         No current facility-administered medications for this visit.    Allergies  Allergen Reactions  . Morphine     REACTION: Heart stopped.    Family History  Problem Relation Age of Onset  .  Heart attack Father   . Heart disease Father   . Cancer Father     multiple myeloma  . Hypertension Father   . Hypertension Mother     History   Social History  . Marital Status: Married    Spouse Name: N/A    Number of Children: 2  . 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     Social History Main Topics  . Smoking status: Former Smoker -- 1.00 packs/day for 20 years    Types: Cigarettes    Quit date: 05/29/2004  . Smokeless tobacco: Never Used  . Alcohol Use: Yes     Comment: rarely  . Drug Use: No  . Sexual Activity: Not on file   Other Topics Concern  . Not on file   Social History Narrative   From [place]   Married     Constitutional: Denies fever, malaise, fatigue, headache or abrupt weight changes.  HEENT: Pt reports nasal congestion, sore throat. Denies eye pain, eye redness, ear pain, ringing in the ears, wax buildup, runny nose, nasal congestion, bloody  nose. Respiratory: Denies difficulty breathing, shortness of breath, cough or sputum production.     No other specific complaints in a complete review of systems (except as listed in HPI above).  Objective:   Physical Exam   BP 122/70  Pulse 68  Temp(Src) 98.5 F (36.9 C) (Oral)  Wt 354 lb 8 oz (160.8 kg) Wt Readings from Last 3 Encounters:  10/07/13 354 lb 8 oz (160.8 kg)  09/16/13 360 lb 8 oz (163.522 kg)  07/16/13 357 lb 12 oz (162.274 kg)    General: Appears his stated age, well developed, well nourished in NAD. HEENT: Head: normal shape and size; Eyes: sclera white, no icterus, conjunctiva pink, PERRLA and EOMs intact; Ears: Tm's gray and intact, normal light reflex; Nose: mucosa pink and moist, septum midline; Throat/Mouth: Teeth present, mucosa erythematous and moist, + PND, no exudate, lesions or ulcerations noted.   Cardiovascular: Normal rate and rhythm. S1,S2 noted.  No murmur, rubs or gallops noted. No JVD or BLE edema. No  carotid bruits noted. Pulmonary/Chest: Normal effort and positive vesicular breath sounds. No respiratory distress. No wheezes, rales or ronchi noted.    BMET    Component Value Date/Time   NA 140 02/07/2013 1612   K 4.1 02/07/2013 1612   CL 104 02/07/2013 1612   CO2 28 02/07/2013 1612   GLUCOSE 107* 02/07/2013 1612   BUN 10 02/07/2013 1612   CREATININE 0.93 02/07/2013 1612   CREATININE 0.80 09/19/2007   CALCIUM 8.9 02/07/2013 1612          Assessment & Plan:   Allergic Rhinitis:  Try zyrtec and flonase OTC Watch for fever, more pain with swallowing  RTC As needed

## 2013-10-07 NOTE — Patient Instructions (Addendum)
Sore Throat A sore throat is pain, burning, irritation, or scratchiness of the throat. There is often pain or tenderness when swallowing or talking. A sore throat may be accompanied by other symptoms, such as coughing, sneezing, fever, and swollen neck glands. A sore throat is often the first sign of another sickness, such as a cold, flu, strep throat, or mononucleosis (commonly known as mono). Most sore throats go away without medical treatment. CAUSES  The most common causes of a sore throat include:  A viral infection, such as a cold, flu, or mono.  A bacterial infection, such as strep throat, tonsillitis, or whooping cough.  Seasonal allergies.  Dryness in the air.  Irritants, such as smoke or pollution.  Gastroesophageal reflux disease (GERD). HOME CARE INSTRUCTIONS   Only take over-the-counter medicines as directed by your caregiver.  Drink enough fluids to keep your urine clear or pale yellow.  Rest as needed.  Try using throat sprays, lozenges, or sucking on hard candy to ease any pain (if older than 4 years or as directed).  Sip warm liquids, such as broth, herbal tea, or warm water with honey to relieve pain temporarily. You may also eat or drink cold or frozen liquids such as frozen ice pops.  Gargle with salt water (mix 1 tsp salt with 8 oz of water).  Do not smoke and avoid secondhand smoke.  Put a cool-mist humidifier in your bedroom at night to moisten the air. You can also turn on a hot shower and sit in the bathroom with the door closed for 5 10 minutes. SEEK IMMEDIATE MEDICAL CARE IF:  You have difficulty breathing.  You are unable to swallow fluids, soft foods, or your saliva.  You have increased swelling in the throat.  Your sore throat does not get better in 7 days.  You have nausea and vomiting.  You have a fever or persistent symptoms for more than 2 3 days.  You have a fever and your symptoms suddenly get worse. MAKE SURE YOU:   Understand  these instructions.  Will watch your condition.  Will get help right away if you are not doing well or get worse. Document Released: 06/22/2004 Document Revised: 05/01/2012 Document Reviewed: 01/21/2012 Exit[employer] Patient Information 2014 [employer].  

## 2013-10-07 NOTE — Progress Notes (Signed)
Pre visit review using our clinic review tool, if applicable. No additional management support is needed unless otherwise documented below in the visit note. 

## 2013-10-09 ENCOUNTER — Other Ambulatory Visit: Payer: Self-pay | Admitting: Internal Medicine

## 2013-10-09 ENCOUNTER — Telehealth: Payer: Self-pay

## 2013-10-09 MED ORDER — AZITHROMYCIN 250 MG PO TABS: ORAL_TABLET | ORAL | Status: DC

## 2013-10-09 MED ORDER — ALBUTEROL SULFATE HFA 108 (90 BASE) MCG/ACT IN AERS: 2.0000 | INHALATION_SPRAY | Freq: Four times a day (QID) | RESPIRATORY_TRACT | Status: DC | PRN

## 2013-10-09 NOTE — Telephone Encounter (Signed)
Is it for Louie Casa or Mrs Lawhead?

## 2013-10-09 NOTE — Telephone Encounter (Signed)
[facility]

## 2013-10-09 NOTE — Telephone Encounter (Signed)
Mrs Dillinger left v/m; pt will be returning home from [place] on 10/10/13 and pts wife request Z pak and inhaler sent to [facility]; pt was seen on 10/07/13; pt has worsened and pt has cough and wheezing. Mrs Cutler request cb when med sent to pharmacy.

## 2013-10-09 NOTE — Telephone Encounter (Signed)
[name] left v/m requesting status of med request and request cb today. The med request is for pt Glenn Lyons; [place]orn is out of town and wife is calling for pt).

## 2013-10-10 NOTE — Telephone Encounter (Signed)
lmovm asking pt to return my call

## 2013-10-16 NOTE — Telephone Encounter (Addendum)
Glenn Lyons left v/m; pt finished zpak that was prescribed 10/09/13, pt is somewhat better but request another zpak; pt still has a lot of coughing and blowing nose. Left v/m for further info; any fever, color of phelgm coughing up,any wheezing or SOB and pharmacy.

## 2013-10-17 ENCOUNTER — Encounter: Payer: Self-pay | Admitting: Family Medicine

## 2013-10-17 ENCOUNTER — Ambulatory Visit (INDEPENDENT_AMBULATORY_CARE_PROVIDER_SITE_OTHER): Payer: BC Managed Care – PPO | Admitting: Family Medicine

## 2013-10-17 VITALS — BP 130/84 | HR 65 | Temp 97.6°F | Wt 358.0 lb

## 2013-10-17 DIAGNOSIS — R05 Cough: Secondary | ICD-10-CM

## 2013-10-17 DIAGNOSIS — R21 Rash and other nonspecific skin eruption: Secondary | ICD-10-CM

## 2013-10-17 DIAGNOSIS — R059 Cough, unspecified: Secondary | ICD-10-CM

## 2013-10-17 MED ORDER — AZITHROMYCIN 250 MG PO TABS: ORAL_TABLET | ORAL | Status: DC

## 2013-10-17 NOTE — Telephone Encounter (Signed)
Pt left v/m requesting cb; left v/m for Mr or Mrs Labo to cb.

## 2013-10-17 NOTE — Progress Notes (Signed)
Pre visit review using our clinic review tool, if applicable. No additional management support is needed unless otherwise documented below in the visit note.  He has a perioral/perinasal/forehead rash, this is not uncommon for him. It happens a few times a year.    Prev seen by Avie Echevaria.  Started zmax 1 week ago.  Done with abx now.  He has achalasia and may not have kept the first dose down.  He is slowly getting better in the meantime, but the chest congestion continues.  Less effect with SABA with this illness compared to prev illnesses.  Ears still stopped up.  Some postnasal gtt.  Leaving town again on Monday.    Meds, vitals, and allergies reviewed.   ROS: See HPI.  Otherwise, noncontributory.  nad ncat except for perioral/perinasal/forehead rash, pink and blanches. Not warm to touch Tm wnl Nasal exam wnl Op with cobblestoning, MMM Neck supple, no LA Coarse BS B, worse with expiration.  No inc in wob abd soft Ext with trace edema

## 2013-10-17 NOTE — Telephone Encounter (Signed)
Mrs Kading called back; no fever, but prod cough with yellow phlegm and when blows nose has yellow mucus. Pt having a lot of wheezing and some SOB after coughing. Pt going out of state again on Mon. Pt scheduled appt to see Dr Damita Dunnings today at 3:15.

## 2013-10-17 NOTE — Patient Instructions (Addendum)
Skip the citalopram while on the zithromax.  Restart the antibiotics and keep using the inhaler for now.   Use OTC hydrocortisone on the rash and see if that helps.  Take care.

## 2013-10-20 ENCOUNTER — Other Ambulatory Visit: Payer: Self-pay | Admitting: Family Medicine

## 2013-10-21 ENCOUNTER — Ambulatory Visit: Payer: Self-pay | Admitting: Pulmonary Disease

## 2013-10-21 DIAGNOSIS — R21 Rash and other nonspecific skin eruption: Secondary | ICD-10-CM | POA: Insufficient documentation

## 2013-10-21 NOTE — Assessment & Plan Note (Signed)
Likely seborrhea, use OTC hydrocortisone and f/u prn.

## 2013-10-21 NOTE — Assessment & Plan Note (Signed)
Nontoxic. With his exam, would restart zmax as he have missed the initial dose.  F/u prn.  He agrees.

## 2013-10-21 NOTE — Telephone Encounter (Signed)
Last filled 10/14 with 5 refills--last office visit 10/17/13--please advise if okay to refill

## 2013-10-22 NOTE — Telephone Encounter (Signed)
Sent!

## 2013-10-31 ENCOUNTER — Encounter: Payer: Self-pay | Admitting: Cardiology

## 2013-11-10 ENCOUNTER — Ambulatory Visit: Payer: Self-pay | Admitting: Pulmonary Disease

## 2013-11-17 ENCOUNTER — Ambulatory Visit: Payer: Self-pay | Admitting: Pulmonary Disease

## 2013-11-27 ENCOUNTER — Other Ambulatory Visit: Payer: Self-pay | Admitting: Family Medicine

## 2013-12-12 ENCOUNTER — Encounter: Payer: Self-pay | Admitting: Internal Medicine

## 2013-12-12 ENCOUNTER — Ambulatory Visit (INDEPENDENT_AMBULATORY_CARE_PROVIDER_SITE_OTHER): Payer: BC Managed Care – PPO | Admitting: Internal Medicine

## 2013-12-12 VITALS — BP 145/81 | HR 60 | Ht 72.0 in | Wt 360.0 lb

## 2013-12-12 DIAGNOSIS — I498 Other specified cardiac arrhythmias: Secondary | ICD-10-CM

## 2013-12-12 DIAGNOSIS — Z950 Presence of cardiac pacemaker: Secondary | ICD-10-CM

## 2013-12-12 DIAGNOSIS — R001 Bradycardia, unspecified: Secondary | ICD-10-CM

## 2013-12-12 LAB — MDC_IDC_ENUM_SESS_TYPE_INCLINIC
Battery Impedance: 1900 Ohm
Battery Voltage: 2.78 V
Brady Statistic RA Percent Paced: 49 %
Brady Statistic RV Percent Paced: 1 % — CL
Date Time Interrogation Session: 20150717144010
Implantable Pulse Generator Model: 5816
Implantable Pulse Generator Serial Number: 1622388
Lead Channel Impedance Value: 330 Ohm
Lead Channel Impedance Value: 635 Ohm
Lead Channel Pacing Threshold Amplitude: 0.75 V
Lead Channel Pacing Threshold Amplitude: 0.75 V
Lead Channel Pacing Threshold Pulse Width: 0.4 ms
Lead Channel Pacing Threshold Pulse Width: 0.5 ms
Lead Channel Sensing Intrinsic Amplitude: 4.9 mV
Lead Channel Sensing Intrinsic Amplitude: 9.8 mV
Lead Channel Setting Pacing Amplitude: 2 V
Lead Channel Setting Pacing Pulse Width: 0.5 ms
Lead Channel Setting Sensing Sensitivity: 2.5 mV

## 2013-12-12 NOTE — Progress Notes (Signed)
      Patient Care Team: Tonia Ghent, MD as PCP - General   HPI  Glenn Lyons is a 58 y.o. male  Seen in pacemaker followup. This was implanted about 2010  o following presentation to his local hospital with chest pain. He was given morphine and in the context of significant sleep apnea he was noted to have a pause. He underwent pacing. He also has treated sleep apnea which has been markedly helpful.      Past Medical History  Diagnosis Date  . Osteoarthrosis, unspecified whether generalized or localized, unspecified site   . Achalasia     with prev eval at [facility].  No intervention as of 2012  . Cardiac pacemaker St. Jude     ERI 2008  . Depressive disorder, not elsewhere classified   . Esophageal reflux   . Obstructive sleep apnea   . Osteoarthrosis, unspecified whether generalized or localized, unspecified site   . Stricture and stenosis of esophagus   . Backache, unspecified   . Morbid obesity   . Sinus bradycardia /pauses     Past Surgical History  Procedure Laterality Date  . Pacemaker insertion    . Colonoscopy w/ polypectomy  11/01/2010  . Esophageal dilation      Current Outpatient Prescriptions  Medication Sig Dispense Refill  . albuterol (PROVENTIL HFA;VENTOLIN HFA) 108 (90 BASE) MCG/ACT inhaler Inhale 2 puffs into the lungs every 6 (six) hours as needed for wheezing or shortness of breath.  1 Inhaler  0  . aspirin 81 MG tablet Take 81 mg by mouth daily.        . cetirizine (ZYRTEC) 10 MG tablet Take 20 mg by mouth daily as needed.       . cholecalciferol (VITAMIN D) 1000 UNITS tablet Take 1,000 Units by mouth daily.      . citalopram (CELEXA) 20 MG tablet TAKE 1 TABLET (20 MG TOTAL) BY MOUTH DAILY.  30 tablet  5  . furosemide (LASIX) 20 MG tablet TAKE 1 TO 2 TABLETS EVERY DAY, USE AS LITTLE AS POSSIBLE.  30 tablet  3  . ibuprofen (ADVIL,MOTRIN) 200 MG tablet Take 200 mg by mouth as needed.         No current facility-administered medications for this visit.     Allergies  Allergen Reactions  . Morphine     REACTION: Heart stopped.    Review of Systems negative except from HPI and PMH  Physical Exam BP 145/81  Pulse 60  Ht 6' (1.829 m)  Wt 360 lb (163.295 kg)  BMI 48.81 kg/m2 Well developed and well nourished in no acute distress HENT normal E scleral and icterus clear Neck Supple JVP flat; carotids brisk and full Clear to ausculation Device pocket well healed; without hematoma or erythema.  There is no tethering   Regular rate and rhythm, no murmurs gallops or rub Soft with active bowel sounds No clubbing cyanosis  1+ Edema Alert and oriented, grossly normal motor and sensory function Skin Warm and Dry  ECG demonstrates atrial pacing at 60 Intervals 19/11/42  Assessment and  Plan  Sinus node dysfunction  Peripheral edema  Pacemaker-St. Jude  The patient's device was interrogated.  The information was reviewed. The device was reprogrammed to inactivate rate response Sleep apnea  Patient is 40% atrially paced. No ventricular pacing  He describes his edema to the fact that his description 14 hours in a car and did not take his diuretics.

## 2013-12-12 NOTE — Patient Instructions (Signed)

## 2013-12-16 ENCOUNTER — Encounter: Payer: Self-pay | Admitting: Internal Medicine

## 2014-02-26 ENCOUNTER — Other Ambulatory Visit: Payer: Self-pay | Admitting: Family Medicine

## 2014-03-16 ENCOUNTER — Encounter: Payer: Self-pay | Admitting: Pulmonary Disease

## 2014-03-16 ENCOUNTER — Ambulatory Visit (INDEPENDENT_AMBULATORY_CARE_PROVIDER_SITE_OTHER): Payer: BC Managed Care – PPO | Admitting: Pulmonary Disease

## 2014-03-16 VITALS — BP 144/76 | HR 61 | Temp 97.0°F | Ht 72.0 in | Wt 378.8 lb

## 2014-03-16 DIAGNOSIS — G4733 Obstructive sleep apnea (adult) (pediatric): Secondary | ICD-10-CM

## 2014-03-16 NOTE — Patient Instructions (Signed)
Continue on cpap, and keep up with mask cushion changes every 3 mos. Would try wearing the chin strap for a month to see if you sleep better. I suspect you need more pressure given your recent weight gain, but your machine is near the maximum.  Work aggressively on weight loss.  followup with me again in one year if doing better, but call if having ongoing issues.

## 2014-03-16 NOTE — Progress Notes (Signed)
   Subjective:    Patient ID: Glenn Lyons, male    DOB: 02-16-1956, 58 y.o.   MRN: [id number]  HPI The patient comes in today for followup of his known obstructive sleep apnea. Unfortunately, he has gained 33 pounds since last visit, and feels that he is not sleeping as well. We do have a download from February which shows good compliance, but positive breakthrough apneas and significant mask leak. It is unclear if this is a problem with his cushions, or whether he is having mouth opening.   Review of Systems  Constitutional: Negative for fever and unexpected weight change.  HENT: Negative for congestion, dental problem, ear pain, nosebleeds, postnasal drip, rhinorrhea, sinus pressure, sneezing, sore throat and trouble swallowing.   Eyes: Negative for redness and itching.  Respiratory: Negative for cough, chest tightness, shortness of breath and wheezing.   Cardiovascular: Negative for palpitations and leg swelling.  Gastrointestinal: Negative for nausea and vomiting.  Genitourinary: Negative for dysuria.  Musculoskeletal: Negative for joint swelling.  Skin: Negative for rash.  Neurological: Negative for headaches.  Hematological: Does not bruise/bleed easily.  Psychiatric/Behavioral: Negative for dysphoric mood. The patient is not nervous/anxious.        Objective:   Physical Exam Morbidly obese male in no acute distress Nose without purulence or discharge noted Neck without lymphadenopathy or thyromegaly No skin breakdown or pressure necrosis from the CPAP mask Lower extremities with edema noted, no cyanosis Alert and oriented, moves all 4 extremities.       Assessment & Plan:

## 2014-03-16 NOTE — Assessment & Plan Note (Signed)
The pt is wearing cpap compliantly, but is continuing to have breakthrough apneas. He is having increased mask leak, and it is unclear if this is because of a cushion or mouth opening. I have asked him to try his chin strap for a month to see if things are better. He is also gained 33 pounds which can increase his pressure needs, and he is at the upper limits of his current bilevel device. Therefore, I have stressed to him the importance of aggressive weight loss.

## 2014-05-28 ENCOUNTER — Other Ambulatory Visit: Payer: Self-pay | Admitting: Family Medicine

## 2014-06-08 ENCOUNTER — Telehealth: Payer: Self-pay | Admitting: Internal Medicine

## 2014-06-08 ENCOUNTER — Encounter: Payer: Self-pay | Admitting: Internal Medicine

## 2014-06-08 NOTE — Telephone Encounter (Signed)
Patient has difficulty with prep in the past due to achalasia.  Is it ok for him to do prepopik? I did explain that he still has to drink water prior to the prepopik.

## 2014-06-08 NOTE — Telephone Encounter (Signed)
I would like to see him in the office His weight will mean he needs to be done at the hospital anyway

## 2014-06-08 NOTE — Telephone Encounter (Signed)
Left message for patient to call back  

## 2014-06-09 ENCOUNTER — Telehealth: Payer: Self-pay | Admitting: Family Medicine

## 2014-06-09 NOTE — Telephone Encounter (Signed)
Colon in the [place] cancelled.  He will come in and see Dr. Carlean Purl on 3/7

## 2014-06-09 NOTE — Telephone Encounter (Signed)
Schedule CPE.  rx sent.  Thanks.  

## 2014-06-09 NOTE — Telephone Encounter (Signed)
Left message for patient to call back  

## 2014-06-09 NOTE — Telephone Encounter (Signed)
Electronic refill request.  Patient has not been in for CPE in quite some time, only acute visits.  Please advise.  Citalopram Last Filled:    30 tablet 5 Rf on 10/22/13   Furosemide Last Filled:    30 tablet 0 RF 05/28/2014  With request for FU OV.  No OV scheduled.

## 2014-06-10 NOTE — Telephone Encounter (Signed)
Please call and schedule appointment as instructed. 

## 2014-06-11 NOTE — Telephone Encounter (Signed)
Appointment 3/21 pt aware Please close

## 2014-06-19 ENCOUNTER — Other Ambulatory Visit: Payer: Self-pay | Admitting: Family Medicine

## 2014-07-04 ENCOUNTER — Other Ambulatory Visit: Payer: Self-pay | Admitting: Family Medicine

## 2014-07-06 ENCOUNTER — Encounter: Payer: Self-pay | Admitting: Internal Medicine

## 2014-07-06 ENCOUNTER — Ambulatory Visit (INDEPENDENT_AMBULATORY_CARE_PROVIDER_SITE_OTHER): Payer: BLUE CROSS/BLUE SHIELD | Admitting: *Deleted

## 2014-07-06 DIAGNOSIS — R001 Bradycardia, unspecified: Secondary | ICD-10-CM

## 2014-07-06 LAB — MDC_IDC_ENUM_SESS_TYPE_INCLINIC
Battery Impedance: 2300 Ohm
Battery Voltage: 2.78 V
Brady Statistic RA Percent Paced: 44 %
Brady Statistic RV Percent Paced: 1 % — CL
Date Time Interrogation Session: 20160208121005
Implantable Pulse Generator Model: 5816
Implantable Pulse Generator Serial Number: 1622388
Lead Channel Impedance Value: 323 Ohm
Lead Channel Impedance Value: 653 Ohm
Lead Channel Pacing Threshold Amplitude: 0.75 V
Lead Channel Pacing Threshold Amplitude: 1 V
Lead Channel Pacing Threshold Pulse Width: 0.4 ms
Lead Channel Pacing Threshold Pulse Width: 0.5 ms
Lead Channel Sensing Intrinsic Amplitude: 5 mV
Lead Channel Setting Pacing Amplitude: 2 V
Lead Channel Setting Pacing Pulse Width: 0.5 ms
Lead Channel Setting Sensing Sensitivity: 2.5 mV

## 2014-07-06 NOTE — Progress Notes (Signed)
Patient presents for device clinic pacemaker check.  No problems with shortness of breath, chest pain, palpitations, or syncope.  Device interrogated and found to be functioning normally.  No changes made today.  See PaceArt report for full details.  Plan ROV with Dr. Caryl Comes in 6 months.  Ranee Gosselin, RN, BSN 07/06/2014 12:03 PM

## 2014-07-18 ENCOUNTER — Other Ambulatory Visit: Payer: Self-pay | Admitting: Family Medicine

## 2014-07-20 NOTE — Telephone Encounter (Signed)
Electronic refill request. Last Filled:    30 tablet 0 RF 07/06/2014  Instructions are to use as little as possible.  Please advise.

## 2014-07-21 NOTE — Telephone Encounter (Signed)
Sent. Thanks.   

## 2014-07-30 ENCOUNTER — Encounter: Payer: Self-pay | Admitting: Internal Medicine

## 2014-08-03 ENCOUNTER — Ambulatory Visit (INDEPENDENT_AMBULATORY_CARE_PROVIDER_SITE_OTHER): Payer: BLUE CROSS/BLUE SHIELD | Admitting: Family Medicine

## 2014-08-03 ENCOUNTER — Ambulatory Visit (INDEPENDENT_AMBULATORY_CARE_PROVIDER_SITE_OTHER): Payer: BLUE CROSS/BLUE SHIELD | Admitting: Internal Medicine

## 2014-08-03 ENCOUNTER — Encounter: Payer: Self-pay | Admitting: Family Medicine

## 2014-08-03 ENCOUNTER — Encounter: Payer: Self-pay | Admitting: Internal Medicine

## 2014-08-03 VITALS — BP 130/78 | HR 60 | Ht 71.25 in | Wt 365.0 lb

## 2014-08-03 VITALS — BP 124/70 | HR 60 | Temp 97.6°F | Wt 366.2 lb

## 2014-08-03 DIAGNOSIS — K220 Achalasia of cardia: Secondary | ICD-10-CM

## 2014-08-03 DIAGNOSIS — R5383 Other fatigue: Secondary | ICD-10-CM

## 2014-08-03 DIAGNOSIS — Z86010 Personal history of colonic polyps: Secondary | ICD-10-CM

## 2014-08-03 LAB — COMPREHENSIVE METABOLIC PANEL
ALT: 19 U/L (ref 0–53)
AST: 16 U/L (ref 0–37)
Albumin: 3.8 g/dL (ref 3.5–5.2)
Alkaline Phosphatase: 67 U/L (ref 39–117)
BUN: 12 mg/dL (ref 6–23)
CO2: 30 mEq/L (ref 19–32)
Calcium: 9 mg/dL (ref 8.4–10.5)
Chloride: 103 mEq/L (ref 96–112)
Creatinine, Ser: 0.96 mg/dL (ref 0.40–1.50)
GFR: 85.78 mL/min (ref >60.00)
Glucose, Bld: 160 mg/dL — ABNORMAL HIGH (ref 70–99)
Potassium: 4.1 mEq/L (ref 3.5–5.1)
Sodium: 136 mEq/L (ref 135–145)
Total Bilirubin: 0.5 mg/dL (ref 0.2–1.2)
Total Protein: 6.5 g/dL (ref 6.0–8.3)

## 2014-08-03 LAB — CBC WITH DIFFERENTIAL/PLATELET
Basophils Absolute: 0 10*3/uL (ref 0.0–0.1)
Basophils Relative: 0.4 % (ref 0.0–3.0)
Eosinophils Absolute: 0.4 10*3/uL (ref 0.0–0.7)
Eosinophils Relative: 4.6 % (ref 0.0–5.0)
HCT: 40.5 % (ref 39.0–52.0)
Hemoglobin: 13.7 g/dL (ref 13.0–17.0)
Lymphocytes Relative: 22.2 % (ref 12.0–46.0)
Lymphs Abs: 1.7 10*3/uL (ref 0.7–4.0)
MCHC: 33.8 g/dL (ref 30.0–36.0)
MCV: 88.4 fl (ref 78.0–100.0)
Monocytes Absolute: 0.5 10*3/uL (ref 0.1–1.0)
Monocytes Relative: 6.1 % (ref 3.0–12.0)
Neutro Abs: 5.2 10*3/uL (ref 1.4–7.7)
Neutrophils Relative %: 66.7 % (ref 43.0–77.0)
Platelets: 228 10*3/uL (ref 150.0–400.0)
RBC: 4.58 Mil/uL (ref 4.22–5.81)
RDW: 13.9 % (ref 11.5–15.5)
WBC: 7.8 10*3/uL (ref 4.0–10.5)

## 2014-08-03 LAB — TSH: TSH: 1.42 u[IU]/mL (ref 0.35–4.50)

## 2014-08-03 MED ORDER — SOD PICOSULFATE-MAG OX-CIT ACD 10-3.5-12 MG-GM-GM PO PACK: 1.0000 | PACK | Freq: Once | ORAL | Status: DC

## 2014-08-03 NOTE — Patient Instructions (Addendum)
You have been scheduled for a colonoscopy at [facility]. Please follow written instructions given to you at your visit today.  Please pick up your prep supplies at the pharmacy within the next 1-3 days. If you use inhalers (even only as needed), please bring them with you on the day of your procedure.   I appreciate the opportunity to care for you. Silvano Rusk, M.D., [facility]

## 2014-08-03 NOTE — Patient Instructions (Signed)
Go to the lab on the way out.  We'll contact you with your lab report. Try to get some exercise/activity daily.

## 2014-08-03 NOTE — Progress Notes (Signed)
Subjective:    Patient ID: Glenn Lyons, male    DOB: 1956/01/11, 59 y.o.   MRN: [id number] Cc: history of colon polyps  HPI The patient is a very nice middle-aged white man with known history of adenomatous and numerous hyperplastic polyps on colonoscopy in 2012. He also has achalasia. He is also morbidly obese. He continues to have swallowing difficulty but tolerates it overall. Sometimes he does have to regurgitate food. He is not losing weight. He was seen by a surgeon regarding possible treatment for achalasia but because of his size it was recommended this be postponed until he suffered from significant weight loss to make him a better operative candidate. The patient has been excepting of this. He is not having any lower GI complaints today. He is currently working out of [facility] and is in town on Mondays. He would like to schedule a colonoscopy on a Monday.  Allergies  Allergen Reactions  . Morphine     REACTION: Heart stopped.   Outpatient Prescriptions Prior to Visit  Medication Sig Dispense Refill  . aspirin 81 MG tablet Take 81 mg by mouth daily.      . cetirizine (ZYRTEC) 10 MG tablet Take 20 mg by mouth daily as needed.     . cholecalciferol (VITAMIN D) 1000 UNITS tablet Take 1,000 Units by mouth daily.    . citalopram (CELEXA) 20 MG tablet TAKE 1 TABLET (20 MG TOTAL) BY MOUTH DAILY. 30 tablet 5  . furosemide (LASIX) 20 MG tablet TAKE 1 TO 2 TABLETS EVERY DAY, USE AS LITTLE AS POSSIBLE. 30 tablet 1  . ibuprofen (ADVIL,MOTRIN) 200 MG tablet Take 200 mg by mouth as needed.      . furosemide (LASIX) 20 MG tablet TAKE 1 TO 2 TABLETS EVERY DAY, USE AS LITTLE AS POSSIBLE. 30 tablet 0   No facility-administered medications prior to visit.   Past Medical History  Diagnosis Date  . Osteoarthrosis, unspecified whether generalized or localized, unspecified site   . Achalasia     with prev eval at [facility].  No intervention as of 2012  . Cardiac pacemaker St. Jude     ERI 2008   . Depressive disorder, not elsewhere classified   . Esophageal reflux   . Obstructive sleep apnea   . Osteoarthrosis, unspecified whether generalized or localized, unspecified site   . Stricture and stenosis of esophagus   . Backache, unspecified   . Morbid obesity   . Sinus bradycardia /pauses    Past Surgical History  Procedure Laterality Date  . Pacemaker insertion    . Colonoscopy w/ polypectomy  11/01/2010  . Esophageal dilation     History   Social History  . Marital Status: Married    Spouse Name: N/A  . Number of Children: 2  . 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     Social History Main Topics  . Smoking status: Former Smoker -- 1.00 packs/day for 20 years    Types: Cigarettes    Quit date: 05/29/2004  . Smokeless tobacco: Never Used  . Alcohol Use: Yes     Comment: rarely  . Drug Use: No  . Sexual Activity: Not on file   Other Topics Concern  . None   Social History Narrative   From [employer] cabin [employer]   Married   Family History  Problem Relation Age of Onset  . Heart attack Father   .  Heart disease Father   . Cancer Father     multiple myeloma  . Hypertension Father   . Hypertension Mother     Review of Systems Continues on CPAP for sleep apnea. Otherwise as per history of present illness.    Objective:   Physical Exam BP 130/78 mmHg  Pulse 60  Ht 5' 11.25" (1.81 m)  Wt 365 lb (165.563 kg)  BMI 50.54 kg/m2 General:  NAD Eyes:   anicteric Lungs:  clear Heart:  S1S2 no rubs, murmurs or gallops Abdomen:  obese  Data Reviewed:  2012 colonoscopy and pathology    Assessment & Plan:   1. Hx of colonic polyps   2. Achalasia   3. Morbid obesity    1. Schedule for colonoscopy at the hospital given his medical comorbidities, namely his morbid obesity. 2. Prep O Pik lower volume prep will be used given his achalasia issues. He did have a good to excellent prep with MoviPrep the last time it  was a large volume to drink and was difficult for him to ingest.  3. The risks and benefits as well as alternatives of endoscopic procedure(s) have been discussed and reviewed. All questions answered. The patient agrees to proceed.  I appreciate the opportunity to care for this patient.

## 2014-08-03 NOTE — Progress Notes (Signed)
Pre visit review using our clinic review tool, if applicable. No additional management support is needed unless otherwise documented below in the visit note.  Back in town temporarily.  Work out of town, [place]. Fatigue noted by patient.   Inactive, by his own admission.  Not much exercise.   Work is going well overall.  Weight is up over the last year.   OSA, still using CPAP, doing well with that.  No snoring per patient.  Compliant with CPAP.   Asked about low T.  Libido isn't decreased.   Still with difficulty from achalasia, with frequent regurgitation.  No thyroid mass noted by patient.   Meds, vitals, and allergies reviewed.   ROS: See HPI.  Otherwise, noncontributory.  nad Obese OP wnl Neck supple, no LA, no TMG rrr ctab abd soft Ext well perfused.

## 2014-08-04 DIAGNOSIS — R5383 Other fatigue: Secondary | ICD-10-CM | POA: Insufficient documentation

## 2014-08-04 NOTE — Assessment & Plan Note (Signed)
Weight is up over the last year, likely contributing.   OSA, still using CPAP, doing well with that. No snoring per patient. Compliant with CPAP.  No metabolic issues seen on labs.  See notes on labs.   Low T likely not the main issue. If it were, weight loss would still be the first issue to address.  Needs more activity and weight reduction, d/w pt.  He agrees.  Update me as needed.

## 2014-08-13 ENCOUNTER — Encounter: Payer: Self-pay | Admitting: Internal Medicine

## 2014-08-17 ENCOUNTER — Encounter: Payer: Self-pay | Admitting: Family Medicine

## 2014-09-07 ENCOUNTER — Encounter: Payer: Self-pay | Admitting: Family Medicine

## 2014-09-25 ENCOUNTER — Encounter (HOSPITAL_COMMUNITY): Payer: Self-pay | Admitting: *Deleted

## 2014-10-03 ENCOUNTER — Other Ambulatory Visit: Payer: Self-pay | Admitting: Family Medicine

## 2014-10-05 ENCOUNTER — Encounter (HOSPITAL_COMMUNITY): Payer: Self-pay | Admitting: Anesthesiology

## 2014-10-05 ENCOUNTER — Inpatient Hospital Stay (HOSPITAL_COMMUNITY)
Admission: RE | Admit: 2014-10-05 | Discharge: 2014-10-10 | DRG: 871 | Disposition: A | Discharge status: Home / Self Care | Payer: BLUE CROSS/BLUE SHIELD | Source: Ambulatory Visit | Attending: Internal Medicine | Admitting: Internal Medicine

## 2014-10-05 ENCOUNTER — Ambulatory Visit (HOSPITAL_COMMUNITY): Payer: BLUE CROSS/BLUE SHIELD | Admitting: Anesthesiology

## 2014-10-05 ENCOUNTER — Ambulatory Visit (HOSPITAL_COMMUNITY): Payer: BLUE CROSS/BLUE SHIELD

## 2014-10-05 ENCOUNTER — Encounter (HOSPITAL_COMMUNITY)
Admission: RE | Disposition: A | Discharge status: Home / Self Care | Payer: Self-pay | Source: Ambulatory Visit | Attending: Internal Medicine

## 2014-10-05 DIAGNOSIS — J9601 Acute respiratory failure with hypoxia: Secondary | ICD-10-CM

## 2014-10-05 DIAGNOSIS — R6883 Chills (without fever): Secondary | ICD-10-CM | POA: Diagnosis present

## 2014-10-05 DIAGNOSIS — R34 Anuria and oliguria: Secondary | ICD-10-CM | POA: Diagnosis present

## 2014-10-05 DIAGNOSIS — R05 Cough: Secondary | ICD-10-CM

## 2014-10-05 DIAGNOSIS — Z7982 Long term (current) use of aspirin: Secondary | ICD-10-CM

## 2014-10-05 DIAGNOSIS — R001 Bradycardia, unspecified: Secondary | ICD-10-CM | POA: Diagnosis present

## 2014-10-05 DIAGNOSIS — F329 Major depressive disorder, single episode, unspecified: Secondary | ICD-10-CM | POA: Diagnosis present

## 2014-10-05 DIAGNOSIS — M1990 Unspecified osteoarthritis, unspecified site: Secondary | ICD-10-CM | POA: Diagnosis present

## 2014-10-05 DIAGNOSIS — D125 Benign neoplasm of sigmoid colon: Secondary | ICD-10-CM | POA: Diagnosis not present

## 2014-10-05 DIAGNOSIS — D72829 Elevated white blood cell count, unspecified: Secondary | ICD-10-CM | POA: Diagnosis present

## 2014-10-05 DIAGNOSIS — K219 Gastro-esophageal reflux disease without esophagitis: Secondary | ICD-10-CM | POA: Diagnosis present

## 2014-10-05 DIAGNOSIS — D128 Benign neoplasm of rectum: Secondary | ICD-10-CM | POA: Diagnosis not present

## 2014-10-05 DIAGNOSIS — D123 Benign neoplasm of transverse colon: Secondary | ICD-10-CM | POA: Diagnosis present

## 2014-10-05 DIAGNOSIS — Z86010 Personal history of colonic polyps: Secondary | ICD-10-CM | POA: Diagnosis not present

## 2014-10-05 DIAGNOSIS — I10 Essential (primary) hypertension: Secondary | ICD-10-CM | POA: Diagnosis present

## 2014-10-05 DIAGNOSIS — R509 Fever, unspecified: Secondary | ICD-10-CM

## 2014-10-05 DIAGNOSIS — Z87891 Personal history of nicotine dependence: Secondary | ICD-10-CM

## 2014-10-05 DIAGNOSIS — Z8249 Family history of ischemic heart disease and other diseases of the circulatory system: Secondary | ICD-10-CM

## 2014-10-05 DIAGNOSIS — Z791 Long term (current) use of non-steroidal anti-inflammatories (NSAID): Secondary | ICD-10-CM

## 2014-10-05 DIAGNOSIS — Z807 Family history of other malignant neoplasms of lymphoid, hematopoietic and related tissues: Secondary | ICD-10-CM

## 2014-10-05 DIAGNOSIS — Z950 Presence of cardiac pacemaker: Secondary | ICD-10-CM

## 2014-10-05 DIAGNOSIS — Z79899 Other long term (current) drug therapy: Secondary | ICD-10-CM

## 2014-10-05 DIAGNOSIS — R739 Hyperglycemia, unspecified: Secondary | ICD-10-CM | POA: Diagnosis present

## 2014-10-05 DIAGNOSIS — R059 Cough, unspecified: Secondary | ICD-10-CM

## 2014-10-05 DIAGNOSIS — K222 Esophageal obstruction: Secondary | ICD-10-CM | POA: Diagnosis present

## 2014-10-05 DIAGNOSIS — Z885 Allergy status to narcotic agent status: Secondary | ICD-10-CM

## 2014-10-05 DIAGNOSIS — J690 Pneumonitis due to inhalation of food and vomit: Secondary | ICD-10-CM | POA: Diagnosis not present

## 2014-10-05 DIAGNOSIS — R0602 Shortness of breath: Secondary | ICD-10-CM

## 2014-10-05 DIAGNOSIS — K220 Achalasia of cardia: Secondary | ICD-10-CM | POA: Diagnosis not present

## 2014-10-05 DIAGNOSIS — Z6841 Body Mass Index (BMI) 40.0 and over, adult: Secondary | ICD-10-CM

## 2014-10-05 DIAGNOSIS — G4733 Obstructive sleep apnea (adult) (pediatric): Secondary | ICD-10-CM | POA: Diagnosis present

## 2014-10-05 DIAGNOSIS — A419 Sepsis, unspecified organism: Principal | ICD-10-CM | POA: Diagnosis present

## 2014-10-05 DIAGNOSIS — K5730 Diverticulosis of large intestine without perforation or abscess without bleeding: Secondary | ICD-10-CM | POA: Diagnosis present

## 2014-10-05 HISTORY — DX: Presence of cardiac pacemaker: Z95.0

## 2014-10-05 HISTORY — PX: COLONOSCOPY WITH PROPOFOL: SHX5780

## 2014-10-05 HISTORY — DX: Pneumonitis due to inhalation of food and vomit: J69.0

## 2014-10-05 LAB — COMPREHENSIVE METABOLIC PANEL
ALT: 26 U/L (ref 17–63)
AST: 24 U/L (ref 15–41)
Albumin: 3.5 g/dL (ref 3.5–5.0)
Alkaline Phosphatase: 69 U/L (ref 38–126)
Anion gap: 10 (ref 5–15)
BUN: 8 mg/dL (ref 6–20)
CO2: 27 mmol/L (ref 22–32)
Calcium: 8.9 mg/dL (ref 8.9–10.3)
Chloride: 102 mmol/L (ref 101–111)
Creatinine, Ser: 1.06 mg/dL (ref 0.61–1.24)
GFR calc Af Amer: >60 mL/min (ref >60)
GFR calc non Af Amer: >60 mL/min (ref >60)
Glucose, Bld: 153 mg/dL — ABNORMAL HIGH (ref 70–99)
Potassium: 4.7 mmol/L (ref 3.5–5.1)
Sodium: 139 mmol/L (ref 135–145)
Total Bilirubin: 1.2 mg/dL (ref 0.3–1.2)
Total Protein: 6.3 g/dL — ABNORMAL LOW (ref 6.5–8.1)

## 2014-10-05 LAB — CBC
HCT: 42.7 % (ref 39.0–52.0)
Hemoglobin: 14 g/dL (ref 13.0–17.0)
MCH: 30 pg (ref 26.0–34.0)
MCHC: 32.8 g/dL (ref 30.0–36.0)
MCV: 91.4 fL (ref 78.0–100.0)
Platelets: 200 10*3/uL (ref 150–400)
RBC: 4.67 MIL/uL (ref 4.22–5.81)
RDW: 13.7 % (ref 11.5–15.5)
WBC: 14.8 10*3/uL — ABNORMAL HIGH (ref 4.0–10.5)

## 2014-10-05 LAB — TROPONIN I: Troponin I: <0.03 ng/mL (ref <0.031)

## 2014-10-05 LAB — GLUCOSE, CAPILLARY: Glucose-Capillary: 115 mg/dL — ABNORMAL HIGH (ref 70–99)

## 2014-10-05 LAB — LACTIC ACID, PLASMA: Lactic Acid, Venous: 2.1 mmol/L (ref 0.5–2.0)

## 2014-10-05 SURGERY — COLONOSCOPY WITH PROPOFOL
Anesthesia: Monitor Anesthesia Care

## 2014-10-05 MED ORDER — SACCHAROMYCES BOULARDII 250 MG PO CAPS
250.0000 mg | ORAL_CAPSULE | Freq: Two times a day (BID) | ORAL | Status: DC
  Filled 2014-10-05: qty 1

## 2014-10-05 MED ORDER — ALBUTEROL SULFATE (2.5 MG/3ML) 0.083% IN NEBU: 2.5000 mg | INHALATION_SOLUTION | RESPIRATORY_TRACT | Status: DC | PRN

## 2014-10-05 MED ORDER — GLYCOPYRROLATE 0.2 MG/ML IJ SOLN
INTRAMUSCULAR | Status: DC | PRN
  Administered 2014-10-05 (×2): 0.1 mg via INTRAVENOUS

## 2014-10-05 MED ORDER — ASPIRIN EC 81 MG PO TBEC
81.0000 mg | DELAYED_RELEASE_TABLET | Freq: Every day | ORAL | Status: DC
  Administered 2014-10-05 – 2014-10-10 (×5): 81 mg via ORAL
  Filled 2014-10-05 (×6): qty 1

## 2014-10-05 MED ORDER — SODIUM CHLORIDE 0.9 % IV SOLN
INTRAVENOUS | Status: DC
  Administered 2014-10-05: 19:00:00 via INTRAVENOUS
  Administered 2014-10-06: 1000 mL via INTRAVENOUS

## 2014-10-05 MED ORDER — PROPOFOL 10 MG/ML IV BOLUS
INTRAVENOUS | Status: AC
  Filled 2014-10-05: qty 20

## 2014-10-05 MED ORDER — PROPOFOL INFUSION 10 MG/ML OPTIME
INTRAVENOUS | Status: DC | PRN
  Administered 2014-10-05: 300 ug/kg/min via INTRAVENOUS

## 2014-10-05 MED ORDER — ONDANSETRON HCL 4 MG PO TABS
4.0000 mg | ORAL_TABLET | Freq: Four times a day (QID) | ORAL | Status: DC | PRN
  Filled 2014-10-05 (×2): qty 1

## 2014-10-05 MED ORDER — PROMETHAZINE HCL 25 MG/ML IJ SOLN: 6.2500 mg | INTRAMUSCULAR | Status: DC | PRN

## 2014-10-05 MED ORDER — LACTATED RINGERS IV SOLN
INTRAVENOUS | Status: DC | PRN
  Administered 2014-10-05: 10:00:00 via INTRAVENOUS

## 2014-10-05 MED ORDER — PROPOFOL 10 MG/ML IV BOLUS
INTRAVENOUS | Status: DC | PRN
  Administered 2014-10-05: 30 mg via INTRAVENOUS
  Administered 2014-10-05: 20 mg via INTRAVENOUS
  Administered 2014-10-05: 10 mg via INTRAVENOUS

## 2014-10-05 MED ORDER — SODIUM CHLORIDE 0.9 % IV SOLN
3.0000 g | Freq: Four times a day (QID) | INTRAVENOUS | Status: DC
  Administered 2014-10-05 – 2014-10-10 (×18): 3 g via INTRAVENOUS
  Filled 2014-10-05 (×21): qty 3

## 2014-10-05 MED ORDER — DEXTROSE 5 % IV SOLN
500.0000 mg | INTRAVENOUS | Status: AC
  Administered 2014-10-05 – 2014-10-07 (×3): 500 mg via INTRAVENOUS
  Filled 2014-10-05 (×3): qty 500

## 2014-10-05 MED ORDER — SODIUM CHLORIDE 0.9 % IV BOLUS (SEPSIS)
1000.0000 mL | Freq: Once | INTRAVENOUS | Status: AC
  Administered 2014-10-05: 1000 mL via INTRAVENOUS

## 2014-10-05 MED ORDER — SODIUM CHLORIDE 0.9 % IV SOLN: INTRAVENOUS | Status: DC

## 2014-10-05 MED ORDER — ACETAMINOPHEN 160 MG/5ML PO SOLN
650.0000 mg | Freq: Four times a day (QID) | ORAL | Status: DC | PRN
  Administered 2014-10-05 – 2014-10-07 (×5): 650 mg via ORAL
  Filled 2014-10-05 (×5): qty 20.3

## 2014-10-05 MED ORDER — ACETAMINOPHEN 325 MG PO TABS
650.0000 mg | ORAL_TABLET | Freq: Four times a day (QID) | ORAL | Status: DC | PRN
  Filled 2014-10-05: qty 2

## 2014-10-05 MED ORDER — KETAMINE HCL 10 MG/ML IJ SOLN
INTRAMUSCULAR | Status: AC
  Filled 2014-10-05: qty 1

## 2014-10-05 MED ORDER — ACETAMINOPHEN 650 MG RE SUPP
650.0000 mg | Freq: Four times a day (QID) | RECTAL | Status: DC | PRN
  Filled 2014-10-05: qty 1

## 2014-10-05 MED ORDER — KETAMINE HCL 10 MG/ML IJ SOLN
INTRAMUSCULAR | Status: DC | PRN
  Administered 2014-10-05: 30 mg via INTRAVENOUS
  Administered 2014-10-05 (×2): 10 mg via INTRAVENOUS

## 2014-10-05 MED ORDER — AZITHROMYCIN 500 MG PO TABS
500.0000 mg | ORAL_TABLET | ORAL | Status: DC
  Filled 2014-10-05: qty 1

## 2014-10-05 MED ORDER — ONDANSETRON HCL 4 MG/2ML IJ SOLN: 4.0000 mg | Freq: Four times a day (QID) | INTRAMUSCULAR | Status: DC | PRN

## 2014-10-05 MED ORDER — FENTANYL CITRATE (PF) 100 MCG/2ML IJ SOLN: 25.0000 ug | INTRAMUSCULAR | Status: DC | PRN

## 2014-10-05 SURGICAL SUPPLY — 21 items

## 2014-10-05 NOTE — Anesthesia Preprocedure Evaluation (Addendum)
Anesthesia Evaluation  Patient identified by MRN, date of birth, ID band Patient awake    Reviewed: Allergy & Precautions, NPO status , Patient's Chart, lab work & pertinent test results  Airway Mallampati: II  TM Distance: >3 FB Neck ROM: Full    Dental no notable dental hx.    Pulmonary sleep apnea and Continuous Positive Airway Pressure Ventilation , former smoker,  breath sounds clear to auscultation  Pulmonary exam normal       Cardiovascular Normal cardiovascular exam+ dysrhythmias + pacemaker Rhythm:Regular Rate:Normal  Pacemaker for sinus bradycardia.   Neuro/Psych PSYCHIATRIC DISORDERS Anxiety Depression negative neurological ROS     GI/Hepatic Neg liver ROS, GERD-  ,  Endo/Other  Morbid obesity  Renal/GU negative Renal ROS  negative genitourinary   Musculoskeletal  (+) Arthritis -, Osteoarthritis,    Abdominal (+) + obese,   Peds negative pediatric ROS (+)  Hematology negative hematology ROS (+)   Anesthesia Other Findings   Reproductive/Obstetrics negative OB ROS                            Anesthesia Physical Anesthesia Plan  ASA: III  Anesthesia Plan: MAC   Post-op Pain Management:    Induction: Intravenous  Airway Management Planned: Natural Airway  Additional Equipment:   Intra-op Plan:   Post-operative Plan:   Informed Consent: I have reviewed the patients History and Physical, chart, labs and discussed the procedure including the risks, benefits and alternatives for the proposed anesthesia with the patient or authorized representative who has indicated his/her understanding and acceptance.   Dental advisory given  Plan Discussed with: CRNA  Anesthesia Plan Comments:         Anesthesia Quick Evaluation

## 2014-10-05 NOTE — Anesthesia Postprocedure Evaluation (Signed)
  Anesthesia Post-op Note  Patient: Glenn Lyons  Procedure(s) Performed: Procedure(s) (LRB): COLONOSCOPY WITH PROPOFOL (N/A)  Patient Location: PACU  Anesthesia Type: MAC  Level of Consciousness: awake and alert   Airway and Oxygen Therapy: Patient Spontanous Breathing  Post-op Pain: mild headache  Post-op Assessment: Post-op Vital signs reviewed, Patient's Cardiovascular Status Stable, Respiratory Function Stable, Patent Airway and No signs of Nausea or vomiting  Last Vitals:  Filed Vitals:   10/05/14 1410  BP: 140/57  Pulse: 65  Temp:   Resp: 10    Post-op Vital Signs: stable   Complications: Glenn Lyons has not felt good since getting to the recovery area. He has complained of body aches, headache, chills, malaise. He became dizzy when he tried to get up to rest room. His blood pressure has been on the low side. He has received as of 1430 about 2.5 liters of lactated ringers with gradual improvement in blood pressure. He has not voided since 0800, and does not feel the urge to urinate now. He says he feels like he has the flu. His nail beds were dusky and due to general ill appearance a CXR and ECG was obtained. A troponin was obtained on the outside chance of a silent cardiac event. Troponin was normal. CXR with hazy opacities with bronchial wall thickening. HR 73, BP 141/69, oxygen saturation 100% on 3 L/minute nasal cannula. I spoke with Dr. Carlean Purl and [name] will be admitted for observation.

## 2014-10-05 NOTE — Discharge Instructions (Addendum)
° °  I found and removed one small polyp in mid colon. Again there are numerous small flat polyps at end of colon like before - and these were sampled - too many to remove. So far they have been the type that are not precancerous. I will let you know results and plans.  You also have a condition called diverticulosis - common and not usually a problem. Please read the handout provided.  I appreciate the opportunity to care for you. Gatha Mayer, MD, FACG  YOU HAD AN ENDOSCOPIC PROCEDURE TODAY: Refer to the procedure report and other information in the discharge instructions given to you for any specific questions about what was found during the examination. If this information does not answer your questions, please call Dr. Celesta Aver office at [phone] to clarify.   YOU SHOULD EXPECT: Some feelings of bloating in the abdomen. Passage of more gas than usual. Walking can help get rid of the air that was put into your GI tract during the procedure and reduce the bloating. If you had a lower endoscopy (such as a colonoscopy or flexible sigmoidoscopy) you may notice spotting of blood in your stool or on the toilet paper. Some abdominal soreness may be present for a day or two, also.  DIET: Your first meal following the procedure should be a light meal and then it is ok to progress to your normal diet. A half-sandwich or bowl of soup is an example of a good first meal. Heavy or fried foods are harder to digest and may make you feel nauseous or bloated. Drink plenty of fluids but you should avoid alcoholic beverages for 24 hours.   ACTIVITY: Your care partner should take you home directly after the procedure. You should plan to take it easy, moving slowly for the rest of the day. You can resume normal activity the day after the procedure however YOU SHOULD NOT DRIVE, use power tools, machinery or perform tasks that involve climbing or major physical exertion for 24 hours (because of the sedation  medicines used during the test).   SYMPTOMS TO REPORT IMMEDIATELY: A gastroenterologist can be reached at any hour. Please call [phone]  for any of the following symptoms:  Following lower endoscopy (colonoscopy, flexible sigmoidoscopy) Excessive amounts of blood in the stool  Significant tenderness, worsening of abdominal pains  Swelling of the abdomen that is new, acute  Fever of 100 or higher  Following upper endoscopy (EGD, EUS, ERCP, esophageal dilation) Vomiting of blood or coffee ground material  New, significant abdominal pain  New, significant chest pain or pain under the shoulder blades  Painful or persistently difficult swallowing  New shortness of breath  Black, tarry-looking or red, bloody stools  FOLLOW UP:  If any biopsies were taken you will be contacted by phone or by letter within the next 1-3 weeks. Call [phone]  if you have not heard about the biopsies in 3 weeks.  Please also call with any specific questions about appointments or follow up tests.

## 2014-10-05 NOTE — Transfer of Care (Signed)
Immediate Anesthesia Transfer of Care Note  Patient: Glenn Lyons  Procedure(s) Performed: Procedure(s): COLONOSCOPY WITH PROPOFOL (N/A)  Patient Location: PACU  Anesthesia Type:MAC  Level of Consciousness: awake, alert  and oriented  Airway & Oxygen Therapy: Patient Spontanous Breathing and Patient connected to face mask oxygen  Post-op Assessment: Report given to RN  Post vital signs: Reviewed and stable  Last Vitals:  Filed Vitals:   10/05/14 1058  BP: 110/55  Pulse: 60  Temp: 37.2 C  Resp: 14    Complications: No apparent anesthesia complications

## 2014-10-05 NOTE — H&P (Signed)
[facility] History and Physical   Primary Care Physician:  Elsie Stain, MD   Reason for Procedure:  Hx colon polyps  Plan:    Colonoscopy - The risks and benefits as well as alternatives of endoscopic procedure(s) have been discussed and reviewed. All questions answered. The patient agrees to proceed.      HPI: Leone Putman is a 59 y.o. male with prior hx adenomatous and hyperplastic colon polyps. Here for repeat/surveillance colonoscopy.   Past Medical History  Diagnosis Date  . Osteoarthrosis, unspecified whether generalized or localized, unspecified site   . Achalasia     with prev eval at [facility].  No intervention as of 2012  . Cardiac pacemaker St. Jude     ERI 2008  . Depressive disorder, not elsewhere classified   . Esophageal reflux   . Obstructive sleep apnea   . Osteoarthrosis, unspecified whether generalized or localized, unspecified site   . Stricture and stenosis of esophagus   . Backache, unspecified   . Morbid obesity   . Sinus bradycardia /pauses   . Presence of permanent cardiac pacemaker     St. Jude- Dr. Caryl Comes follows -device Check 07-06-14    Past Surgical History  Procedure Laterality Date  . Pacemaker insertion    . Colonoscopy w/ polypectomy  11/01/2010  . Esophageal dilation      Prior to Admission medications   Medication Sig Start Date End Date Taking? Authorizing Provider  aspirin 81 MG tablet Take 81 mg by mouth daily.     Yes Historical Provider, MD  cetirizine (ZYRTEC) 10 MG tablet Take 20 mg by mouth daily.    Yes Historical Provider, MD  cholecalciferol (VITAMIN D) 1000 UNITS tablet Take 1,000 Units by mouth daily.   Yes Historical Provider, MD  citalopram (CELEXA) 20 MG tablet TAKE 1 TABLET (20 MG TOTAL) BY MOUTH DAILY. 06/09/14  Yes Tonia Ghent, MD  ibuprofen (ADVIL,MOTRIN) 200 MG tablet Take 200 mg by mouth as needed.     Yes Historical Provider, MD  KRILL OIL PO Take 1 capsule by mouth daily.   Yes Historical Provider,  MD  Multiple Vitamin (MULTIVITAMIN WITH MINERALS) TABS tablet Take 1 tablet by mouth daily.   Yes Historical Provider, MD  furosemide (LASIX) 20 MG tablet TAKE 1 TO 2 TABLETS EVERY DAY, USE AS LITTLE AS POSSIBLE. 10/05/14   Tonia Ghent, MD  Sod Picosulfate-Mag Ox-Cit Acd ([place]) 10-3.5-12 MG-GM-GM PACK Take 1 kit by mouth once. Patient not taking: Reported on 09/14/2014 08/03/14   Gatha Mayer, MD    No current facility-administered medications for this encounter.    Allergies as of 08/03/2014 - Review Complete 08/03/2014  Allergen Reaction Noted  . Morphine      Family History  Problem Relation Age of Onset  . Heart attack Father   . Heart disease Father   . Cancer Father     multiple myeloma  . Hypertension Father   . Hypertension Mother     History   Social History  . Marital Status: Married    Spouse Name: N/A  . Number of Children: 2  . 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     Social History Main Topics  . Smoking status: Former Smoker -- 1.00 packs/day for 20 years    Types: Cigarettes    Quit date: 05/29/2004  . Smokeless tobacco: Never Used  . Alcohol Use: Yes  Comment: rarely  . Drug Use: No  . Sexual Activity: Not on file   Other Topics Concern  . Not on file   Social History Narrative   From [employer]   Married    Review of Systems: Positive for dysphagia from achalasia All other review of systems negative except as mentioned in the HPI.  Physical Exam: Vital signs in last 24 hours:     General:   Alert,  Well-developed, well-nourished, pleasant and cooperative in NAD Lungs:  Clear throughout to auscultation.   Heart:  Regular rate and rhythm; no murmurs, clicks, rubs,  or gallops. Abdomen:  Soft, nontender and nondistended. Normal bowel sounds.   Neuro/Psych:  Alert and cooperative. Normal mood and affect. A and O x 3   '@Danyeal Akens'  Simonne Maffucci, MD, [facility]  Gastroenterology [phone] (pager) 10/05/2014 9:34 AM@

## 2014-10-05 NOTE — Consult Note (Signed)
[facility] Medical Consultation  Valerie Fredin GYI:[id number] DOB: [birth year] DOA: 10/05/2014 PCP: Elsie Stain, MD   Requesting physician: Silvano Rusk Date of consultation:  10/05/2014 Reason for consultation: SOB  Impression/Recommendations  Acute hypoxic respiratory failure likely secondary to viral syndrome such as influenza versus most likely aspiration pneumonia.  CAP also possible.   -  Blood cultures -  Droplet precautions -  Influenza PCR -  Legionella and strep pneumo antigens -  Sputum culture if able -  Unasyn and azithromycin -  Florastor -  Wean oxygen as tolerated -  Lactic acid -  F/u CBC -  Only sign of sepsis currently is his fever.  HR and RR stable.  If WBC elevated, however, would qualify -  If this is aspiration pneumonia could get worse before it gets better, therefore, start continuous pulse oximetry -  If CBC is wnl and GI okay, would change to lovenox or heparin depending on creatinine  Oliguria may be due to acute urinary retention secondary to pain medication/anesthesia versus under resuscitation for sepsis -  Check PVR in place Foley catheter if greater than 300 mL's of retained urine -  Check urinalysis -  Follow-up lactic acid -  F/u BMP  Osteoarthritis, continue ibuprofen when necessary  Achalasia with frequent episodes of mild aspiratin -  Followed by gastroenterology  Depression, stable, continue Celexa  Obstructive sleep apnea, stable, continue nightly BiPAP  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 Please contact me if I can be of assistance in the meanwhile. Thank you for this consultation.  Chief Complaint: chills, fatigue  HPI:  The patient is a 59 year old male with history of achalasia, esophageal reflux with stricture and stenosis of the esophagus, depression, obstructive sleep apnea, morbid obesity, sinus bradycardia with  pauses status post [facility] pacemaker followed by Dr. Caryl Comes who presented today to endoscopy for routine colonoscopy.  The day prior to admission, he felt somewhat fatigued, however he is to be did this to the bowel prep for his colonoscopy. Overnight, he had an episode of aspiration while on his BiPAP machine which caused him to wake up choking and gasping for air.  During colonoscopy was found to have several polyps and diverticulosis. He had multiple biopsies taken from some flat polyps and a polypectomy of one polyp in the transverse colon.  There were no immediate complications, however after the procedure he developed some dizziness and lightheadedness. He developed acute hypotension and was given 1 L normal saline bolus. EKG was done which demonstrated no acute ST segment changes and troponin was negative. Chest x-ray demonstrated some hazy central pulmonary opacities with bronchial wall thickening suggestive of common area edema versus viral or atypical infection. He was monitored over the course of the afternoon and his blood pressure recovered, however he had a persistent oxygen requirement and continued to feel unwell.  Gastroenterology decided to admit him for observation and when he arrived at his room, he developed a fever.    He states that he has chills and feels feverish and his if he has the flu. He did not receive his flu shot that sure. He denies nausea, vomiting, abdominal distention. He was having loose stool secondary to his bowel prep for his colonoscopy however he has not been on any recent antibiotics nor has he been hospitalized in the last 2 months.  He states it feels like he cannot take a  deep breath and feels like his lungs are little bit wheezy. He denies chest pains, palpitations. He has not voided since 5 AM today.     Review of Systems:   General:  Positive fevers, chills, denies weight loss or gain HEENT:  Denies changes to hearing and vision, rhinorrhea, sinus  congestion, sore throat CV:  Denies chest pain and palpitations, chronic mild lower extremity edema.  PULM:  Feels SOB, with wheezing and cough.   GI:  Denies nausea, vomiting, constipation   GU:  Denies dysuria, frequency, urgency, no urine output since 5 AM today  ENDO:  Denies polyuria, polydipsia, No urine output since 5 AM today HEME:  Denies hematemesis, blood in stools, melena, abnormal bruising or bleeding.  LYMPH:  Denies lymphadenopathy.   MSK:  Denies arthralgias, myalgias.   DERM:  Denies skin rash or ulcer.   NEURO:  Denies focal numbness, weakness, slurred speech, confusion, facial droop.  PSYCH:  Denies anxiety and depression.    Past Medical History  Diagnosis Date  . Osteoarthrosis, unspecified whether generalized or localized, unspecified site   . Achalasia     with prev eval at [facility].  No intervention as of 2012  . Cardiac pacemaker St. Jude     ERI 2008  . Depressive disorder, not elsewhere classified   . Esophageal reflux   . Obstructive sleep apnea   . Osteoarthrosis, unspecified whether generalized or localized, unspecified site   . Stricture and stenosis of esophagus   . Backache, unspecified   . Morbid obesity   . Sinus bradycardia /pauses   . Presence of permanent cardiac pacemaker     St. Jude- Dr. Caryl Comes follows -device Check 07-06-14   Past Surgical History  Procedure Laterality Date  . Pacemaker insertion    . Colonoscopy w/ polypectomy  11/01/2010  . Esophageal dilation     Social History:  reports that he quit smoking about 10 years ago. His smoking use included Cigarettes. He has a 20 pack-year smoking history. He has never used smokeless tobacco. He reports that he drinks alcohol. He reports that he does not use illicit drugs.  Allergies  Allergen Reactions  . Morphine     REACTION: Heart stopped.   Family History  Problem Relation Age of Onset  . Heart attack Father   . Heart disease Father   . Cancer Father     multiple myeloma  .  Hypertension Father   . Hypertension Mother     Prior to Admission medications   Medication Sig Start Date End Date Taking? Authorizing Provider  aspirin 81 MG tablet Take 81 mg by mouth daily.     Yes Historical Provider, MD  cetirizine (ZYRTEC) 10 MG tablet Take 20 mg by mouth daily.    Yes Historical Provider, MD  cholecalciferol (VITAMIN D) 1000 UNITS tablet Take 1,000 Units by mouth daily.   Yes Historical Provider, MD  citalopram (CELEXA) 20 MG tablet TAKE 1 TABLET (20 MG TOTAL) BY MOUTH DAILY. 06/09/14  Yes Tonia Ghent, MD  ibuprofen (ADVIL,MOTRIN) 200 MG tablet Take 200 mg by mouth as needed.     Yes Historical Provider, MD  KRILL OIL PO Take 1 capsule by mouth daily.   Yes Historical Provider, MD  Multiple Vitamin (MULTIVITAMIN WITH MINERALS) TABS tablet Take 1 tablet by mouth daily.   Yes Historical Provider, MD  furosemide (LASIX) 20 MG tablet TAKE 1 TO 2 TABLETS EVERY DAY, USE AS LITTLE AS POSSIBLE. 10/05/14   Elveria Rising  Damita Dunnings, MD  Sod Picosulfate-Mag Ox-Cit Acd ([place]) 10-3.5-12 MG-GM-GM PACK Take 1 kit by mouth once. Patient not taking: Reported on 09/14/2014 08/03/14   Gatha Mayer, MD   Physical Exam: Blood pressure 140/57, pulse 65, temperature 98.4 F (36.9 C), temperature source Oral, resp. rate 10, height '5\' 11"'  (1.803 m), weight 161.027 kg (355 lb), SpO2 100 %. Filed Vitals:   10/05/14 1340 10/05/14 1350 10/05/14 1400 10/05/14 1410  BP: 131/44 125/46 137/61 140/57  Pulse: 65 66 67 65  Temp:      TempSrc:      Resp: '18 12 16 10  ' Height:      Weight:      SpO2: 99% 100% 100% 100%     General:  Obese male, no acute distress  Eyes:  PERRL, anicteric, non-injected.  ENT:  Nares clear.  OP clear, non-erythematous without plaques or exudates.  MMM.  Neck:  Supple without TM or JVD.    Lymph:  No cervical, supraclavicular, or submandibular LAD.  Cardiovascular:  RRR, normal S1, S2, without m/r/g.  2+ pulses, warm extremities  Respiratory:   diminished  breath sounds at the right base and coarse rales at the left base, no wheezes or rhonchi, no increased WOB.  Abdomen:  NABS.  Soft, ND/NT.    Skin:   faint erythematous rash between his eyebrows, no obvious ulcerations   Musculoskeletal:  Normal bulk and tone.   1+ trace bilateral pittingLE edema.  Psychiatric:  A & O x 4.  Appropriate affect.  Neurologic:  CN 3-12 intact.  5/5 strength.  Sensation intact.  Labs on Admission:  Basic Metabolic Panel: No results for input(s): NA, K, CL, CO2, GLUCOSE, BUN, CREATININE, CALCIUM, MG, PHOS in the last 168 hours. Liver Function Tests: No results for input(s): AST, ALT, ALKPHOS, BILITOT, PROT, ALBUMIN in the last 168 hours. No results for input(s): LIPASE, AMYLASE in the last 168 hours. No results for input(s): AMMONIA in the last 168 hours. CBC: No results for input(s): WBC, NEUTROABS, HGB, HCT, MCV, PLT in the last 168 hours. Cardiac Enzymes:  Recent Labs Lab 10/05/14 1310  TROPONINI <0.03   BNP: Invalid input(s): POCBNP CBG:  Recent Labs Lab 10/05/14 1333  GLUCAP 115*    Radiological Exams on Admission: Dg Chest Port 1 View  10/05/2014   CLINICAL DATA:  Shortness of breath after colonoscopy  EXAM: PORTABLE CHEST - 1 VIEW  COMPARISON:  07/16/2013  FINDINGS: Right-sided dual lead pacer in place. Mild enlargement of the cardiomediastinal silhouette is identified. There are patchy ill-defined hazy pulmonary opacities centrally with central bronchial wall thickening bilaterally. No pleural effusion. No pneumothorax. No acute osseus and.  IMPRESSION: Hazy central pulmonary opacities with bronchial wall thickening which may indicate pulmonary edema. Viral/atypical infection could appear similar.   Electronically Signed   By: Conchita Paris M.D.   On: 10/05/2014 13:29    EKG: Independently reviewed.  NSR, no acute ST segment changes  Time spent: 75 min  Allie Ousley [facility] Pager [phone]  If 7PM-7AM, please  contact night-coverage [email or web address] Password [facility] 10/05/2014, 4:23 PM

## 2014-10-05 NOTE — Op Note (Addendum)
[facility] [place], [id number]   COLONOSCOPY PROCEDURE REPORT  PATIENT: Glenn Lyons, Glenn Lyons  MR#: [id number] BIRTHDATE: 12-12-1955 , 59  yrs. old GENDER: male ENDOSCOPIST: Gatha Mayer, MD, [facility] PROCEDURE DATE:  10/05/2014 PROCEDURE:   Colonoscopy, surveillance , Colonoscopy with biopsy, and Colonoscopy with snare polypectomy First Screening Colonoscopy - Avg.  risk and is 50 yrs.  old or older - No.  Prior Negative Screening - Now for repeat screening. N/A  History of Adenoma - Now for follow-up colonoscopy & has been > or = to 3 yrs.  Yes hx of adenoma.  Has been 3 or more years since last colonoscopy.  Polyps Removed Today ASA CLASS:   Class III INDICATIONS:Surveillance due to prior colonic neoplasia and PH Colon Adenoma. MEDICATIONS: Monitored anesthesia care and Per Anesthesia  DESCRIPTION OF PROCEDURE:   After the risks benefits and alternatives of the procedure were thoroughly explained, informed consent was obtained.  The digital rectal exam revealed no abnormalities of the rectum, revealed the prostate was not enlarged, and revealed no prostatic nodules.   The Pentax Adult Colonscope [id number]  endoscope was introduced through the anus and advanced to the cecum, which was identified by both the appendix and ileocecal valve. No adverse events experienced.   The quality of the prep was adequate (Prep o Pik was used)  The instrument was then slowly withdrawn as the colon was fully examined.  COLON FINDINGS: A sessile polyp measuring 3 mm in size was found in the transverse colon.  A polypectomy was performed with a cold snare.  The resection was complete, the polyp tissue was completely retrieved and sent to histology.   An innumerable number of flat polyps ranging from 1 to 35mm in size were found in the sigmoid colon and rectum.  Multiple biopsies were performed using cold forceps.   There was moderate diverticulosis noted in the sigmoid colon.    The examination was otherwise normal.  Retroflexed views revealed no abnormalities. The time to cecum = 6.2 Withdrawal time = 13.7   The scope was withdrawn and the procedure completed. COMPLICATIONS: There were no immediate complications.  ENDOSCOPIC IMPRESSION: 1.   Sessile polyp was found in the transverse colon; polypectomy was performed with a cold snare 2.   Innumerable number of flat polyps ranging from 1 to 1mm in size were found in the sigmoid colon and rectum; multiple biopsies were performed using cold forceps 3.   Moderate diverticulosis was noted in the sigmoid colon 4.   The examination was otherwise normal - adequate prep - hx adenomas and hyperplastic polyps  RECOMMENDATIONS: Await biopsy results  eSigned:  Gatha Mayer, MD, [facility] 10/05/2014 11:17 AM Revised: 10/05/2014 11:17 AM  cc: The Patient

## 2014-10-05 NOTE — Progress Notes (Signed)
RT placed patient on CPAP. Patient has own mask and tubing. Patients home setting is 25 cmH20, but our machine only goes to 20 cmH2O. Patient is tolerating well with the 20 cmH20. Sterile water was added to the water chamber for humidification. RT will continue to monitor.

## 2014-10-05 NOTE — Progress Notes (Signed)
EKG done, chest xray done, labs drawn

## 2014-10-05 NOTE — Progress Notes (Signed)
Pt got dizzy close to discharge, dr. Carlean Purl at bedside; restarted an IV, giving a liter of fluid, will continue to monitor closely

## 2014-10-05 NOTE — H&P (Signed)
Admission Note  Primary Care Physician:  Elsie Stain, MD Primary Gastroenterologist:    Silvano Rusk, MD  HPI: Glenn Lyons is a 59 y.o. male known to Dr. Carlean Purl for a history of adenomatous colon polyps as well as achalasia. Patient underwent outpatient surveillance colonoscopy today at the hospital. He had several polyps removed using cold forceps. Post procedure the patient complained of dizziness, he was hypotensive. BP improved with IVF. EKG showed normal sinus tachycardia. CXR revealed bronchial wall thickening and possibly pulmonary edema. Troponin negative. Blood glucose normal. Patient complains of aches and chills but is afebrile. He hasn't urinated since 8am today. Per wife, patient woke up at 5am today "choking". He apparently swallowed some of the secretions which pooled in esophagus.    Past Medical History  Diagnosis Date  . Osteoarthrosis, unspecified whether generalized or localized, unspecified site   . Achalasia     with prev eval at [facility].  No intervention as of 2012  . Cardiac pacemaker St. Jude     ERI 2008  . Depressive disorder, not elsewhere classified   . Esophageal reflux   . Obstructive sleep apnea   . Osteoarthrosis, unspecified whether generalized or localized, unspecified site   . Stricture and stenosis of esophagus   . Morbid obesity   . Sinus bradycardia /pauses   . Presence of permanent cardiac pacemaker     St. Jude- Dr. Caryl Comes follows -device Check 07-06-14    Past Surgical History  Procedure Laterality Date  . Pacemaker insertion    . Colonoscopy w/ polypectomy  11/01/2010  . Esophageal dilation      Prior to Admission medications   Medication Sig Start Date End Date Taking? Authorizing Provider  aspirin 81 MG tablet Take 81 mg by mouth daily.     Yes Historical Provider, MD  cetirizine (ZYRTEC) 10 MG tablet Take 20 mg by mouth daily.    Yes Historical Provider, MD  cholecalciferol (VITAMIN D) 1000 UNITS tablet Take 1,000 Units by mouth daily.    Yes Historical Provider, MD  citalopram (CELEXA) 20 MG tablet TAKE 1 TABLET (20 MG TOTAL) BY MOUTH DAILY. 06/09/14  Yes Tonia Ghent, MD  ibuprofen (ADVIL,MOTRIN) 200 MG tablet Take 200 mg by mouth as needed.     Yes Historical Provider, MD  KRILL OIL PO Take 1 capsule by mouth daily.   Yes Historical Provider, MD  Multiple Vitamin (MULTIVITAMIN WITH MINERALS) TABS tablet Take 1 tablet by mouth daily.   Yes Historical Provider, MD  furosemide (LASIX) 20 MG tablet TAKE 1 TO 2 TABLETS EVERY DAY, USE AS LITTLE AS POSSIBLE. 10/05/14   Tonia Ghent, MD  Sod Picosulfate-Mag Ox-Cit Acd ([place]) 10-3.5-12 MG-GM-GM PACK Take 1 kit by mouth once. Patient not taking: Reported on 09/14/2014 08/03/14   Gatha Mayer, MD    Current Facility-Administered Medications  Medication Dose Route Frequency Provider Last Rate Last Dose  . 0.9 %  sodium chloride infusion   Intravenous Continuous Gatha Mayer, MD      . 0.9 %  sodium chloride infusion   Intravenous Continuous Willia Craze, NP      . acetaminophen (TYLENOL) tablet 650 mg  650 mg Oral Q6H PRN Willia Craze, NP       Or  . acetaminophen (TYLENOL) suppository 650 mg  650 mg Rectal Q6H PRN Willia Craze, NP      . ondansetron [facility]) tablet 4 mg  4 mg Oral Q6H PRN Willia Craze,  NP       Or  . ondansetron (ZOFRAN) injection 4 mg  4 mg Intravenous Q6H PRN Willia Craze, NP        Allergies as of 08/03/2014 - Review Complete 08/03/2014  Allergen Reaction Noted  . Morphine      Family History  Problem Relation Age of Onset  . Heart attack Father   . Heart disease Father   . Cancer Father     multiple myeloma  . Hypertension Father   . Hypertension Mother     History   Social History  . Marital Status: Married    Spouse Name: N/A  . Number of Children: 2  . 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     Social History Main Topics  . Smoking status: Former Smoker -- 1.00  packs/day for 20 years    Types: Cigarettes    Quit date: 05/29/2004  . Smokeless tobacco: Never Used  . Alcohol Use: Yes     Comment: rarely  . Drug Use: No  . Sexual Activity: Not on file   Other Topics Concern  . Not on file   Social History Narrative   From [employer]   Married    Review of Systems:  All systems reviewed an negative except where noted in HPI.  Physical Exam: Vital signs in last 24 hours: Temp:  [98.5 F (36.9 C)-98.9 F (37.2 C)] 98.9 F (37.2 C) (05/09 1058) Pulse Rate:  [58-70] 65 (05/09 1410) Resp:  [9-18] 10 (05/09 1410) BP: (62-140)/(39-86) 140/57 mmHg (05/09 1410) SpO2:  [92 %-100 %] 100 % (05/09 1410) Weight:  [355 lb (161.027 kg)] 355 lb (161.027 kg) (05/09 0957)   General:  Pleasant, obese white male in NAD Head:  Normocephalic and atraumatic. Eyes:  Sclera clear, no icterus.   Conjunctiva pink. Ears:  Normal auditory acuity. Neck:  Supple; no masses . Lungs:  Diminished breath sounds at both bases. No wheezes heard. No wheezes, crackles, or rhonchi. No acute distress. Heart:  Regular rate and rhythm; no murmurs. Abdomen: Soft, obese, nontender. Difficult to assess for masses / hepatomegaly secondary to body habitus. . Normal bowel .    Rectal:  Not done Msk:  Symmetrical without gross deformities.. Pulses:  Normal pulses noted. Extremities:  Without edema. Neurologic:  Alert and  oriented x4;  grossly normal neurologically. Skin:  Intact without significant lesions or rashes. Cervical Nodes:  No significant cervical adenopathy. Psych:  Alert and cooperative. Normal mood and affect.  Lab Results: No results for input(s): WBC, HGB, HCT, PLT in the last 72 hours. BMET No results for input(s): NA, K, CL, CO2, GLUCOSE, BUN, CREATININE, CALCIUM in the last 72 hours. LFT No results for input(s): PROT, ALBUMIN, AST, ALT, ALKPHOS, BILITOT, BILIDIR, IBILI in the last 72 hours. PT/INR No results for input(s): LABPROT, INR in  the last 72 hours. Hepatitis Panel No results for input(s): HEPBSAG, HCVAB, HEPAIGM, HEPBIGM in the last 72 hours.  Studies/Results: Dg Chest Port 1 View  10/05/2014   CLINICAL DATA:  Shortness of breath after colonoscopy  EXAM: PORTABLE CHEST - 1 VIEW  COMPARISON:  07/16/2013  FINDINGS: Right-sided dual lead pacer in place. Mild enlargement of the cardiomediastinal silhouette is identified. There are patchy ill-defined hazy pulmonary opacities centrally with central bronchial wall thickening bilaterally. No pleural effusion. No pneumothorax. No acute osseus and.  IMPRESSION: Hazy central pulmonary  opacities with bronchial wall thickening which may indicate pulmonary edema. Viral/atypical infection could appear similar.   Electronically Signed   By: Conchita Paris M.D.   On: 10/05/2014 13:29    Impression / Plan:   55. 59 year old male with dizziness, hypotension and chills post colonoscopy with polypectomy today. EKG / troponin okay. No free air on CXR. He feels a little winded. Not sure what is causing his symptoms. Wife states patient woke up at 5am today choking on esophageal secretions.  Suspect aspiration. Will obtain additional labs.  Consulted [facility], ? Aspiration vrs other pulmonary issue. Respiratory Therapy to set up patient's Bipap. 02 sat 95% on 2.5 liters, will decrease to a 1 lliter.. Hold home meds, he hasn't taken any of them in a few days anyway.   2. Diminished urinary output. No urine since 8am. He did take a bowel prep and was hypotensive earlier so could be volume depletion. He got two- three liters of LR in endoscopy. Will scan bladder for volume and insert foley if 250cc or more.    2. Permanent pacemaker  3. OSA, uses Bipap at home.      Addendum: Temp just checked - at 102 degrees now. [facility] to see soon and plans to treat empirically for aspiration. Will get am cxr  Tye Savoy  10/05/2014, 3:50 PM    Patient seen earlier today It looks like  he aspirated prior to colonoscopy vs during. Did have some suctioning of mouth during colonoscopy so could have occurred then but did not seem to aspirate during procedure.  Gatha Mayer, MD, [facility] [phone] (pager) 10/05/2014 8:59 PM

## 2014-10-05 NOTE — Progress Notes (Signed)
ANTIBIOTIC CONSULT NOTE - INITIAL  Pharmacy Consult for ampicillin/sulbactama Indication: aspiration pneumonia  Allergies  Allergen Reactions  . Morphine     REACTION: Heart stopped.    Patient Measurements: Height: 5\' 11"  (180.3 cm) Weight: (!) 355 lb (161.027 kg) IBW/kg (Calculated) : 75.3 Adjusted Body Weight:   Vital Signs: Temp: 98.4 F (36.9 C) (05/09 1330) Temp Source: Oral (05/09 1058) BP: 140/57 mmHg (05/09 1410) Pulse Rate: 65 (05/09 1410) Intake/Output from previous day:   Intake/Output from this shift: Total I/O In: 500 [I.V.:500] Out: -   Labs: No results for input(s): WBC, HGB, PLT, LABCREA, CREATININE in the last 72 hours. CrCl cannot be calculated (Patient has no serum creatinine result on file.). No results for input(s): VANCOTROUGH, VANCOPEAK, VANCORANDOM, GENTTROUGH, GENTPEAK, GENTRANDOM, TOBRATROUGH, TOBRAPEAK, TOBRARND, AMIKACINPEAK, AMIKACINTROU, AMIKACIN in the last 72 hours.   Microbiology: No results found for this or any previous visit (from the past 720 hour(s)).  Medical History: Past Medical History  Diagnosis Date  . Osteoarthrosis, unspecified whether generalized or localized, unspecified site   . Achalasia     with prev eval at [facility].  No intervention as of 2012  . Cardiac pacemaker St. Jude     ERI 2008  . Depressive disorder, not elsewhere classified   . Esophageal reflux   . Obstructive sleep apnea   . Osteoarthrosis, unspecified whether generalized or localized, unspecified site   . Stricture and stenosis of esophagus   . Backache, unspecified   . Morbid obesity   . Sinus bradycardia /pauses   . Presence of permanent cardiac pacemaker     St. Jude- Dr. Caryl Comes follows -device Check 07-06-14   Assessment: 23 YOM presented for colonoscopy (several polyps removed), developed dizziness/hypotension post-procedure thus admitted.  Per wife woke up morning of procedure cough/choking on own secretions.  Pharmacy asked to start  ampicillin/sulbactam for possible aspiration pneumonia.  5/9 >> amp/sulb >> 5/9 >> azith >>  Sputum: to be collected 5/9 blood: ordered  Goal of Therapy:  Dose per patient-specific parameters  Plan:   Ampicillin/sulbactam 3gm IV q6h (higher dose for obesity)  No further dose adjustment required  Doreene Eland, PharmD, BCPS.   Pager: [phone]  10/05/2014,5:30 PM

## 2014-10-06 ENCOUNTER — Encounter (HOSPITAL_COMMUNITY): Payer: Self-pay | Admitting: Internal Medicine

## 2014-10-06 ENCOUNTER — Inpatient Hospital Stay (HOSPITAL_COMMUNITY): Payer: BLUE CROSS/BLUE SHIELD

## 2014-10-06 DIAGNOSIS — A419 Sepsis, unspecified organism: Secondary | ICD-10-CM | POA: Diagnosis present

## 2014-10-06 DIAGNOSIS — J189 Pneumonia, unspecified organism: Secondary | ICD-10-CM | POA: Diagnosis not present

## 2014-10-06 DIAGNOSIS — D123 Benign neoplasm of transverse colon: Secondary | ICD-10-CM | POA: Diagnosis present

## 2014-10-06 DIAGNOSIS — Z8249 Family history of ischemic heart disease and other diseases of the circulatory system: Secondary | ICD-10-CM | POA: Diagnosis not present

## 2014-10-06 DIAGNOSIS — K5730 Diverticulosis of large intestine without perforation or abscess without bleeding: Secondary | ICD-10-CM | POA: Diagnosis present

## 2014-10-06 DIAGNOSIS — Z807 Family history of other malignant neoplasms of lymphoid, hematopoietic and related tissues: Secondary | ICD-10-CM | POA: Diagnosis not present

## 2014-10-06 DIAGNOSIS — J690 Pneumonitis due to inhalation of food and vomit: Secondary | ICD-10-CM | POA: Diagnosis present

## 2014-10-06 DIAGNOSIS — Z79899 Other long term (current) drug therapy: Secondary | ICD-10-CM | POA: Diagnosis not present

## 2014-10-06 DIAGNOSIS — R1310 Dysphagia, unspecified: Secondary | ICD-10-CM | POA: Diagnosis not present

## 2014-10-06 DIAGNOSIS — Z6841 Body Mass Index (BMI) 40.0 and over, adult: Secondary | ICD-10-CM | POA: Diagnosis not present

## 2014-10-06 DIAGNOSIS — F329 Major depressive disorder, single episode, unspecified: Secondary | ICD-10-CM | POA: Diagnosis present

## 2014-10-06 DIAGNOSIS — Z7982 Long term (current) use of aspirin: Secondary | ICD-10-CM | POA: Diagnosis not present

## 2014-10-06 DIAGNOSIS — R6520 Severe sepsis without septic shock: Secondary | ICD-10-CM

## 2014-10-06 DIAGNOSIS — M1990 Unspecified osteoarthritis, unspecified site: Secondary | ICD-10-CM | POA: Diagnosis present

## 2014-10-06 DIAGNOSIS — D125 Benign neoplasm of sigmoid colon: Secondary | ICD-10-CM | POA: Diagnosis present

## 2014-10-06 DIAGNOSIS — R34 Anuria and oliguria: Secondary | ICD-10-CM | POA: Diagnosis present

## 2014-10-06 DIAGNOSIS — K219 Gastro-esophageal reflux disease without esophagitis: Secondary | ICD-10-CM | POA: Diagnosis present

## 2014-10-06 DIAGNOSIS — R001 Bradycardia, unspecified: Secondary | ICD-10-CM | POA: Diagnosis present

## 2014-10-06 DIAGNOSIS — R739 Hyperglycemia, unspecified: Secondary | ICD-10-CM | POA: Diagnosis present

## 2014-10-06 DIAGNOSIS — Z791 Long term (current) use of non-steroidal anti-inflammatories (NSAID): Secondary | ICD-10-CM | POA: Diagnosis not present

## 2014-10-06 DIAGNOSIS — D72829 Elevated white blood cell count, unspecified: Secondary | ICD-10-CM | POA: Diagnosis present

## 2014-10-06 DIAGNOSIS — G4733 Obstructive sleep apnea (adult) (pediatric): Secondary | ICD-10-CM | POA: Diagnosis present

## 2014-10-06 DIAGNOSIS — J9601 Acute respiratory failure with hypoxia: Secondary | ICD-10-CM | POA: Diagnosis present

## 2014-10-06 DIAGNOSIS — K220 Achalasia of cardia: Secondary | ICD-10-CM | POA: Diagnosis present

## 2014-10-06 DIAGNOSIS — Z885 Allergy status to narcotic agent status: Secondary | ICD-10-CM | POA: Diagnosis not present

## 2014-10-06 DIAGNOSIS — Z950 Presence of cardiac pacemaker: Secondary | ICD-10-CM | POA: Diagnosis not present

## 2014-10-06 DIAGNOSIS — R42 Dizziness and giddiness: Secondary | ICD-10-CM | POA: Diagnosis present

## 2014-10-06 DIAGNOSIS — I10 Essential (primary) hypertension: Secondary | ICD-10-CM | POA: Diagnosis present

## 2014-10-06 DIAGNOSIS — K222 Esophageal obstruction: Secondary | ICD-10-CM | POA: Diagnosis present

## 2014-10-06 DIAGNOSIS — Z87891 Personal history of nicotine dependence: Secondary | ICD-10-CM | POA: Diagnosis not present

## 2014-10-06 LAB — COMPREHENSIVE METABOLIC PANEL
ALT: 21 U/L (ref 17–63)
AST: 17 U/L (ref 15–41)
Albumin: 3.1 g/dL — ABNORMAL LOW (ref 3.5–5.0)
Alkaline Phosphatase: 59 U/L (ref 38–126)
Anion gap: 3 — ABNORMAL LOW (ref 5–15)
BUN: 12 mg/dL (ref 6–20)
CO2: 26 mmol/L (ref 22–32)
Calcium: 8.2 mg/dL — ABNORMAL LOW (ref 8.9–10.3)
Chloride: 106 mmol/L (ref 101–111)
Creatinine, Ser: 0.98 mg/dL (ref 0.61–1.24)
GFR calc Af Amer: >60 mL/min (ref >60)
GFR calc non Af Amer: >60 mL/min (ref >60)
Glucose, Bld: 145 mg/dL — ABNORMAL HIGH (ref 70–99)
Potassium: 4.1 mmol/L (ref 3.5–5.1)
Sodium: 135 mmol/L (ref 135–145)
Total Bilirubin: 1.6 mg/dL — ABNORMAL HIGH (ref 0.3–1.2)
Total Protein: 5.9 g/dL — ABNORMAL LOW (ref 6.5–8.1)

## 2014-10-06 LAB — URINALYSIS, ROUTINE W REFLEX MICROSCOPIC
Bilirubin Urine: NEGATIVE
Glucose, UA: NEGATIVE mg/dL
Hgb urine dipstick: NEGATIVE
Ketones, ur: NEGATIVE mg/dL
Leukocytes, UA: NEGATIVE
Nitrite: NEGATIVE
Protein, ur: NEGATIVE mg/dL
Specific Gravity, Urine: 1.009 (ref 1.005–1.030)
Urobilinogen, UA: 0.2 mg/dL (ref 0.0–1.0)
pH: 6.5 (ref 5.0–8.0)

## 2014-10-06 LAB — CBC
HCT: 37.7 % — ABNORMAL LOW (ref 39.0–52.0)
Hemoglobin: 12.3 g/dL — ABNORMAL LOW (ref 13.0–17.0)
MCH: 29.9 pg (ref 26.0–34.0)
MCHC: 32.6 g/dL (ref 30.0–36.0)
MCV: 91.5 fL (ref 78.0–100.0)
Platelets: 207 10*3/uL (ref 150–400)
RBC: 4.12 MIL/uL — ABNORMAL LOW (ref 4.22–5.81)
RDW: 13.9 % (ref 11.5–15.5)
WBC: 18.8 10*3/uL — ABNORMAL HIGH (ref 4.0–10.5)

## 2014-10-06 LAB — STREP PNEUMONIAE URINARY ANTIGEN: Strep Pneumo Urinary Antigen: NEGATIVE

## 2014-10-06 LAB — INFLUENZA PANEL BY PCR (TYPE A & B)
H1N1 flu by pcr: NOT DETECTED
Influenza A By PCR: NEGATIVE
Influenza B By PCR: NEGATIVE

## 2014-10-06 LAB — LACTIC ACID, PLASMA
Lactic Acid, Venous: 1.4 mmol/L (ref 0.5–2.0)
Lactic Acid, Venous: 1.2 mmol/L (ref 0.5–2.0)

## 2014-10-06 MED ORDER — TRAMADOL-ACETAMINOPHEN 37.5-325 MG PO TABS: 1.0000 | ORAL_TABLET | Freq: Once | ORAL | Status: DC

## 2014-10-06 NOTE — Progress Notes (Signed)
Pt brought their own CPAP machine, [employer] Called.

## 2014-10-06 NOTE — Progress Notes (Signed)
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 Caryl Comes) who was admitted after a routine colonoscopy with acute sepsis (dizziness with hypotension, tachycardia, fever of 102.67F, leukocytosis) with possible pneumonia on chest x-ray. On the night prior to admission patient reports waking up choking and gasping for air and thinks he probably aspirated. Patient admitted to telemetry for sepsis likely secondary to aspiration pneumonia. Patient admitted by [facility] and Hospitalist consulted for medical management of sepsis.  Assessment/Plan: Sepsis with acute hypoxic respiratory failure Appears to be likely secondary to aspiration pneumonia. Follow blood cultures. Flu PCR negative. Follow urine for strep and Legionella antigen. Placed on empiric Unasyn and azithromycin for aspiration pneumonia. -Clinically improved. Afebrile this morning and maintaining O2 sat on room air. Lactic acid normal. -Supportive care with Tylenol and antitussives. Flutter valve at bedside. -Monitor WBC in a.m. -Patient reports frequent reflux and choking sensations at home. Request swallow evaluation.  Oliguria on admission ? Acute anesthesia. Having good urine output at this time. UA unremarkable. No urinary retention.  Achalasia Has frequent episodes of mild aspiration. Will obtain swallow evaluation. Follows with GI as outpatient.  Depression Stable. Continue Celexa  Obstructive sleep apnea Continue nighttime BiPAP  Sinus bradycardia Status post pacemaker. Continue aspirin  Osteoarthritis Continue when necessary Motrin.  Elevated blood glucose.  Check A1c   Code Status: Full  code Family Communication: None at bedside Disposition Plan: Home possibly on 5/11 vs 5/12 if remains afebrile and clinically improved   Consultants:  [facility]  Procedures:  None  Antibiotics:  IV Unasyn and azithromycin since 5/9--  HPI/Subjective: Patient seen and examined. Still having some difficulty breathing off and on with congestion. Reports productive cough. .  Objective: Filed Vitals:   10/06/14 0943  BP: 105/53  Pulse: 59  Temp: 98.3 F (36.8 C)  Resp: 18    Intake/Output Summary (Last 24 hours) at 10/06/14 1326 Last data filed at 10/06/14 0943  Gross per 24 hour  Intake 1773.33 ml  Output   1325 ml  Net 448.33 ml   Filed Weights   10/05/14 0957  Weight: 161.027 kg (355 lb)    Exam:   General:  Middle aged morbidly obese male in no acute distress  HEENT: No pallor, moist oral mucosa, supple neck  Chest: Fine crackles over lung bases rhonchi or wheeze  Cardiovascular: Normal S1 and S2, no murmurs rub or gallop  GI: Soft, nondistended, nontender, bowel sounds present  Musculoskeletal: On, no edema  CNS: Alert and oriented  Data Reviewed: Basic Metabolic Panel:  Recent Labs Lab 10/05/14 1720 10/06/14 0420  NA 139 135  K 4.7 4.1  CL 102 106  CO2 27 26  GLUCOSE 153* 145*  BUN 8 12  CREATININE 1.06 0.98  CALCIUM 8.9 8.2*   Liver Function Tests:  Recent Labs Lab 10/05/14 1720 10/06/14 0420  AST 24 17  ALT 26 21  ALKPHOS 69 59  BILITOT 1.2 1.6*  PROT 6.3* 5.9*  ALBUMIN 3.5 3.1*   No results for input(s): LIPASE, AMYLASE in the last 168 hours. No results for input(s): AMMONIA in the last 168 hours. CBC:  Recent Labs Lab 10/05/14 1720 10/06/14 0420  WBC 14.8* 18.8*  HGB 14.0 12.3*  HCT 42.7 37.7*  MCV 91.4 91.5  PLT 200 207   Cardiac Enzymes:  Recent Labs Lab 10/05/14 1310  TROPONINI <0.03   BNP (last 3 results) No results for input(s): BNP in the last 8760 hours.  ProBNP (last 3 results) No results  for input(s): PROBNP in the last 8760 hours.  CBG:  Recent Labs Lab 10/05/14 1333  GLUCAP 115*    No results found for this or any previous visit (from the past 240 hour(s)).   Studies: Dg Chest Port 1 View  10/05/2014   CLINICAL DATA:  Shortness of breath after colonoscopy  EXAM: PORTABLE CHEST - 1 VIEW  COMPARISON:  07/16/2013  FINDINGS: Right-sided dual lead pacer in place. Mild enlargement of the cardiomediastinal silhouette is identified. There are patchy ill-defined hazy pulmonary opacities centrally with central bronchial wall thickening bilaterally. No pleural effusion. No pneumothorax. No acute osseus and.  IMPRESSION: Hazy central pulmonary opacities with bronchial wall thickening which may indicate pulmonary edema. Viral/atypical infection could appear similar.   Electronically Signed   By: Conchita Paris M.D.   On: 10/05/2014 13:29    Scheduled Meds: . ampicillin-sulbactam (UNASYN) IV  3 g Intravenous 4 times per day  . aspirin EC  81 mg Oral Daily  . azithromycin  500 mg Intravenous Q24H   Continuous Infusions: . sodium chloride 100 mL/hr at 10/05/14 1906       Time spent: 25 minutes    [name], [place]  [facility] Pager [phone]. If 7PM-7AM, please contact night-coverage at [email or web address], password [facility] 10/06/2014, 1:26 PM

## 2014-10-06 NOTE — Progress Notes (Signed)
   Patient Name: Glenn Lyons Date of Encounter: 10/06/2014, 8:40 AM    Subjective  Feels better - still weak and mild pleuritic chest pain Urinated finally Says he has to sit to urinate - difficulty initiating stream   Objective  BP 104/56 mmHg  Pulse 61  Temp(Src) 98.4 F (36.9 C) (Axillary)  Resp 18  Ht 5\' 11"  (1.803 m)  Wt 355 lb (161.027 kg)  BMI 49.53 kg/m2  SpO2 97% Lungs cta ant abd obese and nontender   Labs reviewed WBC is up   Assessment and Plan  Aspiration PNA seems likely - before or during colonoscopy  or both. Sounds like fairly certain he did something like that at 0500 yesterday per hx from wife. Was suctioned some during colonoscopy. My bias is it preceded colonoscopy based upon timing of clinical changes   Appreciate [facility] care - we will follow out of concern and interest but care per Dr. Soyla Dryer, MD, [facility] [phone] (pager) 10/06/2014 8:40 AM

## 2014-10-06 NOTE — Progress Notes (Signed)
Pt is using home CPAP machine set at 25cmH2O on room air via home nasal mask. Sterile water was added for humidification. Pt states he does not need any assistance with CPAP and will place himself on when ready. Pt was encouraged to contact RT throughout the night if needed. RT will continue to monitor as needed.

## 2014-10-07 DIAGNOSIS — J189 Pneumonia, unspecified organism: Secondary | ICD-10-CM

## 2014-10-07 DIAGNOSIS — D72829 Elevated white blood cell count, unspecified: Secondary | ICD-10-CM

## 2014-10-07 DIAGNOSIS — J9601 Acute respiratory failure with hypoxia: Secondary | ICD-10-CM

## 2014-10-07 DIAGNOSIS — A419 Sepsis, unspecified organism: Principal | ICD-10-CM

## 2014-10-07 LAB — LEGIONELLA ANTIGEN, URINE

## 2014-10-07 MED ORDER — OXYCODONE-ACETAMINOPHEN 5-325 MG PO TABS
1.0000 | ORAL_TABLET | Freq: Four times a day (QID) | ORAL | Status: DC | PRN
  Administered 2014-10-07 – 2014-10-09 (×9): 1 via ORAL
  Filled 2014-10-07 (×9): qty 1

## 2014-10-07 NOTE — Progress Notes (Signed)
   Has a headache again today, got percocet. Patient thinks headaches from lack of nutrition. He is clears awaiting a swallowing study. Will discuss with Hospitalist but patient has achalasia, his swallowing problems are chronic and not any worse then normal. Swallowing study probably won't add much. Patient has seen surgery but surgical options limited by his obesity. Clear liquids are often more problematic then solids so will give him soft diet as tolerated.   Appreciate Hospitalist's care. Patient on Unasyn and Zithromycin for aspiration pna. Home when Hospitalist feels he is stable.   Tye Savoy, NP  Agree with Ms. Guenther's assessment and plan. Gatha Mayer, MD, [facility]

## 2014-10-07 NOTE — Evaluation (Signed)
SLP Cancellation Note  Patient Details Name: Glenn Lyons MRN: [id number] DOB: [birth year]   Cancelled treatment:       Reason Eval/Treat Not Completed: Other (comment) (evaluation ordered cancelled by GI NP,  note pt with h/o achalasia, please reorder if desire)  Luanna Salk, [place] [facility] SLP [phone]

## 2014-10-07 NOTE — Progress Notes (Addendum)
Patient ID: Glenn Lyons, male   DOB: 11-26-1955, 59 y.o.   MRN: [id number] [facility] PROGRESS NOTE  Glenn Lyons [id number] DOB: Nov 12, 1955 DOA: 10/05/2014 PCP: Elsie Stain, MD  Brief narrative:    59 year old male with past medical history of achalasia, esophageal reflux with stricture and stenosis of the esophagus, depression, obstructive sleep apnea on BiPAP, morbid obesity, sinus bradycardia (s/p St. Jude pacemaker, follows with Dr. Caryl Comes). Pt was admitted with fever, tachycardia, hypotension after he has had routine colonoscopy. CXR on 10/05/14 showed hazy, central pulmonary opacities with bronchial wall thickening which may indicated pulmonary edema or viral  / atypical infection. He was started on Unasyn and azithromycin for possible sepsis due to aspiration pneumonia.   Barrier to discharge: on IV Unasyn, azithro for aspiration pneumonia. Home 10/08/14 if respiratory status remains stable.   Assessment/Plan:    Principal Problem: Acute hypoxic respiratory failure / Aspiration pneumonia - Hypoxia likely due to aspiration pneumonia  - CXR showed hazy, central pulmonary opacities with bronchial wall thickening which may indicated pulmonary edema or viral  / atypical infection - Pt started on empiric Unasyn and azithromycin - Blood cultures to date are negative - Influenza PCR negative. Legionella and strep pneumonia negative   Active Problems: Sepsis due to pneumonia / Leukocytosis  - Sepsis criteria met on admission; fever, tachycardia, leukocytosis - Started on Unasyn and azithromycin   Oliguria on admission - Likely due to acute anesthesia.  - UO imrpoved  Obstructive sleep apnea - Continue nighttime CPAP  Sinus bradycardia - Status post pacemaker.  - Stable     DVT Prophylaxis  - SCD's bilaterally   Code Status: Full.  Family Communication:  plan of care discussed with the patient Disposition Plan: Home likely 10/08/14 if he feels better in terms of  shortness of breath   IV access:  Peripheral IV  Procedures and diagnostic studies:    Dg Chest 2 View 10/06/2014  1. Asymmetric infiltrates or edema, worse on the left and improved on the right since previous exam.     Dg Chest Port 1 View 10/05/2014 Hazy central pulmonary opacities with bronchial wall thickening which may indicate pulmonary edema. Viral/atypical infection could appear similar.     Medical Consultants:  Gastroenterology   Other Consultants:  None   IAnti-Infectives:   Unasyn and azithromycin 10/05/2014 -->  Leisa Lenz, MD  [facility] Pager [phone]  Time spent in minutes: 25 minutes  If 7PM-7AM, please contact night-coverage [email or web address] Password [email or web address] 10/07/2014, 8:02 AM   LOS: 1 day    HPI/Subjective: No acute overnight events. Patient reports shortness of breath with exertion.  Objective: Filed Vitals:   10/06/14 1340 10/06/14 2137 10/07/14 0213 10/07/14 0532  BP: 114/56 107/60 143/59 117/53  Pulse: 68 73 63 63  Temp: 98.9 F (37.2 C) 99.3 F (37.4 C) 99.8 F (37.7 C) 98.1 F (36.7 C)  TempSrc: Oral Oral Axillary Oral  Resp: _0 Height:      Weight:      SpO2: 88% 88% 90% 93%    Intake/Output Summary (Last 24 hours) at 10/07/14 0802 Last data filed at 10/07/14 0600  Gross per 24 hour  Intake 2133.33 ml  Output   1950 ml  Net 183.33 ml    Exam:   General:  Pt is alert, follows commands appropriately, not in acute distress  Cardiovascular: Regular rate and rhythm, S1/S2, no murmurs  Respiratory: Diminished bilaterally, no wheezing, no crackles, no rhonchi  Abdomen: Soft, non  tender, non distended, bowel sounds present  Extremities: No edema, pulses DP and PT palpable bilaterally  Neuro: Grossly nonfocal  Data Reviewed: Basic Metabolic Panel:  Recent Labs Lab 10/05/14 1720 10/06/14 0420  NA 139 135  K 4.7 4.1  CL 102 106  CO2 27 26  GLUCOSE 153* 145*  BUN 8 12  CREATININE 1.06 0.98  CALCIUM 8.9  8.2*   Liver Function Tests:  Recent Labs Lab 10/05/14 1720 10/06/14 0420  AST 24 17  ALT 26 21  ALKPHOS 69 59  BILITOT 1.2 1.6*  PROT 6.3* 5.9*  ALBUMIN 3.5 3.1*   No results for input(s): LIPASE, AMYLASE in the last 168 hours. No results for input(s): AMMONIA in the last 168 hours. CBC:  Recent Labs Lab 10/05/14 1720 10/06/14 0420  WBC 14.8* 18.8*  HGB 14.0 12.3*  HCT 42.7 37.7*  MCV 91.4 91.5  PLT 200 207   Cardiac Enzymes:  Recent Labs Lab 10/05/14 1310  TROPONINI <0.03   BNP: Invalid input(s): POCBNP CBG:  Recent Labs Lab 10/05/14 1333  GLUCAP 115*    Recent Results (from the past 240 hour(s))  Culture, blood (routine x 2)     Status: None (Preliminary result)   Collection Time: 10/05/14  5:40 PM  Result Value Ref Range Status   Specimen Description BLOOD RIGHT HAND  Final   Special Requests BOTTLES DRAWN AEROBIC AND ANAEROBIC 10CC  Final   Culture   Final           BLOOD CULTURE RECEIVED NO GROWTH TO DATE CULTURE WILL BE HELD FOR 5 DAYS BEFORE ISSUING A FINAL NEGATIVE REPORT Performed at [facility]    Report Status PENDING  Incomplete  Culture, blood (routine x 2)     Status: None (Preliminary result)   Collection Time: 10/05/14  5:45 PM  Result Value Ref Range Status   Specimen Description BLOOD RIGHT ARM  Final   Special Requests BAA 10CC   Final   Culture   Final           BLOOD CULTURE RECEIVED NO GROWTH TO DATE CULTURE WILL BE HELD FOR 5 DAYS BEFORE ISSUING A FINAL NEGATIVE REPORT Performed at [facility]    Report Status PENDING  Incomplete     Scheduled Meds: . ampicillin-sulbactam (UNASYN) IV  3 g Intravenous 4 times per day  . aspirin EC  81 mg Oral Daily  . azithromycin  500 mg Intravenous Q24H  . traMADol-acetaminophen  1 tablet Oral Once   Continuous Infusions: . sodium chloride 1,000 mL (10/06/14 2246)

## 2014-10-07 NOTE — Progress Notes (Signed)
Dr  Charlies Silvers notified patient request for motrin for headache.  Await orders

## 2014-10-08 ENCOUNTER — Inpatient Hospital Stay (HOSPITAL_COMMUNITY): Payer: BLUE CROSS/BLUE SHIELD

## 2014-10-08 DIAGNOSIS — K220 Achalasia of cardia: Secondary | ICD-10-CM

## 2014-10-08 DIAGNOSIS — G4733 Obstructive sleep apnea (adult) (pediatric): Secondary | ICD-10-CM

## 2014-10-08 MED ORDER — SODIUM CHLORIDE 0.9 % IJ SOLN
INTRAMUSCULAR | Status: AC
  Filled 2014-10-08: qty 10

## 2014-10-08 MED ORDER — ACETAMINOPHEN 325 MG PO TABS: 650.0000 mg | ORAL_TABLET | Freq: Four times a day (QID) | ORAL | Status: DC | PRN

## 2014-10-08 MED ORDER — SODIUM CHLORIDE 0.9 % IV SOLN: INTRAVENOUS | Status: DC

## 2014-10-08 MED ORDER — SODIUM CHLORIDE 0.9 % IJ SOLN
100.0000 [IU] | Freq: Once | INTRAMUSCULAR | Status: DC
  Filled 2014-10-08: qty 100

## 2014-10-08 NOTE — Anesthesia Preprocedure Evaluation (Addendum)
Anesthesia Evaluation  Patient identified by MRN, date of birth, ID band Patient awake    Reviewed: Allergy & Precautions, NPO status , Patient's Chart, lab work & pertinent test results  Airway Mallampati: II  TM Distance: >3 FB Neck ROM: Full    Dental no notable dental hx.    Pulmonary sleep apnea and Continuous Positive Airway Pressure Ventilation , pneumonia -, former smoker,  breath sounds clear to auscultation  Pulmonary exam normal       Cardiovascular Normal cardiovascular exam+ dysrhythmias + pacemaker Rhythm:Regular Rate:Normal  Pacemaker for sinus bradycardia.   Neuro/Psych PSYCHIATRIC DISORDERS Anxiety Depression negative neurological ROS     GI/Hepatic Neg liver ROS, GERD-  ,  Endo/Other  Morbid obesity  Renal/GU negative Renal ROS     Musculoskeletal  (+) Arthritis -, Osteoarthritis,    Abdominal (+) + obese,   Peds  Hematology negative hematology ROS (+)   Anesthesia Other Findings   Reproductive/Obstetrics negative OB ROS                           Anesthesia Physical  Anesthesia Plan  ASA: III  Anesthesia Plan: General   Post-op Pain Management:    Induction: Intravenous  Airway Management Planned: Oral ETT  Additional Equipment:   Intra-op Plan:   Post-operative Plan: Extubation in OR  Informed Consent: I have reviewed the patients History and Physical, chart, labs and discussed the procedure including the risks, benefits and alternatives for the proposed anesthesia with the patient or authorized representative who has indicated his/her understanding and acceptance.   Dental advisory given  Plan Discussed with: CRNA  Anesthesia Plan Comments:        Anesthesia Quick Evaluation

## 2014-10-08 NOTE — Progress Notes (Addendum)
   Still struggling with headaches and has also been having significant right shoulder pain (new since admission) Otherwise feels okay. Being treated for aspiration pna.   Patient has difficult time swallowing given achalasia. Food and secretions pool in esophagus. He regurgitates undigested food / fluids. Patient travels, often alone and concerned about possibility of aspirating during sleep. He wasn't surgical candidate because of weight. Will discuss with primary GI MD, Dr. Carlean Purl to see if botox injections would help. Other options?   Tye Savoy, NP-C   He has been having increasing dysphagia so we hae decided to proceed to EGD with Botox injection for achalasia.  Off O2 Lungs clr except bibasilar crackles that clear with resp  SpO2 Readings from Last 3 Encounters:  10/08/14 98%  08/03/14 95%  03/16/14 97%    The risks and benefits as well as alternatives of endoscopic procedure(s) have been discussed and reviewed. All questions answered. The patient agrees to proceed.  He last had food 0800 and liquids at 1000.  Plan for 315-330 egd w/ Botox.  May still be able to go home today - will discuss.  Gatha Mayer, MD, [facility] [phone] (pager) 10/08/2014 1:13 PM   Need to change  EGD to 0730 5/13

## 2014-10-08 NOTE — Progress Notes (Signed)
Patient ID: Glenn Lyons, male   DOB: [birth year], 57 y.o.   MRN: [id number] [facility] PROGRESS NOTE  Glenn Lyons [id number]:[id number] DOB: [birth year] DOA: 10/05/2014 PCP: Glenn Stain, MD  Brief narrative:    59 year old male with past medical history of achalasia, esophageal reflux with stricture and stenosis of the esophagus, depression, obstructive sleep apnea on BiPAP, morbid obesity, sinus bradycardia (s/p St. Jude pacemaker, follows with Dr. Caryl Comes). Pt was admitted with fever, tachycardia, hypotension after he has had routine colonoscopy. CXR on 10/05/14 showed hazy, central pulmonary opacities with bronchial wall thickening which may indicated pulmonary edema or viral  / atypical infection. He was started on Unasyn and azithromycin for possible sepsis due to aspiration pneumonia.   Barrier to discharge: not feeling better although no shortness of breath. Repeat CXR today. Home 10/09/2014.   Assessment/Plan:    Principal Problem: Acute hypoxic respiratory failure / Aspiration pneumonia - Obtain CXR today. - Continue Unasyn but stop azithromycin, has received azithromycin for 4 days  - Blood cultures to date are negative - Influenza PCR negative. Legionella and strep pneumonia negative   Active Problems: Sepsis due to pneumonia / Leukocytosis  - Sepsis criteria met on admission; fever, tachycardia, leukocytosis - Pt was on Unasyn and azithromycin. Azithromycin stopped 10/08/2014. - Continue Unasyn  Oliguria on admission - Likely due to acute anesthesia.  - Voiding freely.   Obstructive sleep apnea - Continue nighttime CPAP  Sinus bradycardia - Status post pacemaker.  - Stable     DVT Prophylaxis  - SCD's bilaterally   Code Status: Full.  Family Communication:  plan of care discussed with the patient Disposition Plan: Home 10/09/2014.  IV access:  Peripheral IV  Procedures and diagnostic studies:    Dg Chest 2 View 10/06/2014  1. Asymmetric infiltrates or edema, worse  on the left and improved on the right since previous exam.     Dg Chest Port 1 View 10/05/2014 Hazy central pulmonary opacities with bronchial wall thickening which may indicate pulmonary edema. Viral/atypical infection could appear similar.     Medical Consultants:  Gastroenterology   Other Consultants:  None   IAnti-Infectives:   Unasyn 10/05/2014 --> Azithromycin 10/05/2014 --> 10/08/2014   Glenn Lenz, MD  [facility] Pager [phone]  Time spent in minutes: 15 minutes  If 7PM-7AM, please contact night-coverage [email or web address] Password [email or web address] 10/08/2014, 4:20 PM   LOS: 2 days    HPI/Subjective: No acute overnight events. Patient says "i'm not feeling better", he reports feeling weak although his shortness of breath is better.   Objective: Filed Vitals:   10/07/14 2156 10/07/14 2228 10/08/14 0537 10/08/14 1400  BP: 124/65  135/72 131/61  Pulse: 60  60 61  Temp: 102.1 F (38.9 C) 99.8 F (37.7 C) 98.1 F (36.7 C) 99.5 F (37.5 C)  TempSrc: Oral Oral Oral Oral  Resp: '18  18 18  ' Height:      Weight:      SpO2: 98%  98% 90%    Intake/Output Summary (Last 24 hours) at 10/08/14 1620 Last data filed at 10/08/14 1400  Gross per 24 hour  Intake    640 ml  Output   1050 ml  Net   -410 ml    Exam:   General:  Pt is alert, not in acute distress  Cardiovascular: Regular rate and rhythm, S1/S2 (+)  Respiratory: bilateral air entry, no wheezing  Abdomen: obese, non tender abdomen, (+) BS  Extremities: no cyanosis, pulses palpable   Neuro: Nonfocal  Data Reviewed: Basic Metabolic Panel:  Recent Labs Lab 10/05/14 1720 10/06/14 0420  NA 139 135  K 4.7 4.1  CL 102 106  CO2 27 26  GLUCOSE 153* 145*  BUN 8 12  CREATININE 1.06 0.98  CALCIUM 8.9 8.2*   Liver Function Tests:  Recent Labs Lab 10/05/14 1720 10/06/14 0420  AST 24 17  ALT 26 21  ALKPHOS 69 59  BILITOT 1.2 1.6*  PROT 6.3* 5.9*  ALBUMIN 3.5 3.1*   No results for input(s): LIPASE,  AMYLASE in the last 168 hours. No results for input(s): AMMONIA in the last 168 hours. CBC:  Recent Labs Lab 10/05/14 1720 10/06/14 0420  WBC 14.8* 18.8*  HGB 14.0 12.3*  HCT 42.7 37.7*  MCV 91.4 91.5  PLT 200 207   Cardiac Enzymes:  Recent Labs Lab 10/05/14 1310  TROPONINI <0.03   BNP: Invalid input(s): POCBNP CBG:  Recent Labs Lab 10/05/14 1333  GLUCAP 115*    Recent Results (from the past 240 hour(s))  Culture, blood (routine x 2)     Status: None (Preliminary result)   Collection Time: 10/05/14  5:40 PM  Result Value Ref Range Status   Specimen Description BLOOD RIGHT HAND  Final   Special Requests BOTTLES DRAWN AEROBIC AND ANAEROBIC 10CC  Final   Culture   Final           BLOOD CULTURE RECEIVED NO GROWTH TO DATE CULTURE WILL BE HELD FOR 5 DAYS BEFORE ISSUING A FINAL NEGATIVE REPORT Performed at [facility]    Report Status PENDING  Incomplete  Culture, blood (routine x 2)     Status: None (Preliminary result)   Collection Time: 10/05/14  5:45 PM  Result Value Ref Range Status   Specimen Description BLOOD RIGHT ARM  Final   Special Requests BAA 10CC   Final   Culture   Final           BLOOD CULTURE RECEIVED NO GROWTH TO DATE CULTURE WILL BE HELD FOR 5 DAYS BEFORE ISSUING A FINAL NEGATIVE REPORT Performed at [facility]    Report Status PENDING  Incomplete     Scheduled Meds: . ampicillin-sulbactam (UNASYN) IV  3 g Intravenous 4 times per day  . aspirin EC  81 mg Oral Daily  . botox 100 units/NS 4 mL for final conc. 25 units/mL mixture  100 Units Submucosal Once  . traMADol-acetaminophen  1 tablet Oral Once   Continuous Infusions: . sodium chloride 10 mL/hr at 10/07/14 0802  . sodium chloride

## 2014-10-09 ENCOUNTER — Inpatient Hospital Stay (HOSPITAL_COMMUNITY): Payer: BLUE CROSS/BLUE SHIELD | Admitting: Anesthesiology

## 2014-10-09 ENCOUNTER — Encounter (HOSPITAL_COMMUNITY): Payer: Self-pay

## 2014-10-09 ENCOUNTER — Encounter (HOSPITAL_COMMUNITY)
Admission: RE | Disposition: A | Discharge status: Home / Self Care | Payer: Self-pay | Source: Ambulatory Visit | Attending: Internal Medicine

## 2014-10-09 DIAGNOSIS — R1310 Dysphagia, unspecified: Secondary | ICD-10-CM

## 2014-10-09 HISTORY — PX: ESOPHAGOGASTRODUODENOSCOPY: SHX5428

## 2014-10-09 LAB — HIV ANTIBODY (ROUTINE TESTING W REFLEX): HIV Screen 4th Generation wRfx: REACTIVE — AB

## 2014-10-09 LAB — HIV 1/2 AB DIFFERENTIATION
HIV 1 Ab: NONREACTIVE
HIV 2 Ab: NONREACTIVE

## 2014-10-09 LAB — RNA QUALITATIVE: HIV 1 RNA Qualitative: NEGATIVE

## 2014-10-09 SURGERY — EGD (ESOPHAGOGASTRODUODENOSCOPY)
Anesthesia: General

## 2014-10-09 MED ORDER — SODIUM CHLORIDE 0.9 % IJ SOLN
INTRAMUSCULAR | Status: DC | PRN
  Administered 2014-10-09: 4 mL via SUBMUCOSAL

## 2014-10-09 MED ORDER — PROPOFOL 10 MG/ML IV BOLUS
INTRAVENOUS | Status: AC
  Filled 2014-10-09: qty 20

## 2014-10-09 MED ORDER — LIDOCAINE HCL (CARDIAC) 20 MG/ML IV SOLN
INTRAVENOUS | Status: AC
  Filled 2014-10-09: qty 5

## 2014-10-09 MED ORDER — SODIUM CHLORIDE 0.9 % IJ SOLN
100.0000 [IU] | INTRAMUSCULAR | Status: DC
  Filled 2014-10-09: qty 100

## 2014-10-09 MED ORDER — ONDANSETRON HCL 4 MG/2ML IJ SOLN
INTRAMUSCULAR | Status: AC
  Filled 2014-10-09: qty 2

## 2014-10-09 MED ORDER — GLYCOPYRROLATE 0.2 MG/ML IJ SOLN
INTRAMUSCULAR | Status: AC
  Filled 2014-10-09: qty 1

## 2014-10-09 MED ORDER — REMIFENTANIL HCL 1 MG IV SOLR
INTRAVENOUS | Status: AC
  Filled 2014-10-09: qty 1000

## 2014-10-09 MED ORDER — LIDOCAINE HCL (CARDIAC) 20 MG/ML IV SOLN
INTRAVENOUS | Status: DC | PRN
  Administered 2014-10-09: 100 mg via INTRAVENOUS

## 2014-10-09 MED ORDER — PROPOFOL 10 MG/ML IV BOLUS
INTRAVENOUS | Status: DC | PRN
  Administered 2014-10-09: 200 mg via INTRAVENOUS

## 2014-10-09 MED ORDER — SUCCINYLCHOLINE CHLORIDE 20 MG/ML IJ SOLN
INTRAMUSCULAR | Status: DC | PRN
  Administered 2014-10-09: 200 mg via INTRAVENOUS

## 2014-10-09 MED ORDER — SODIUM CHLORIDE 0.9 % IV SOLN
1000.0000 ug | INTRAVENOUS | Status: DC | PRN
  Administered 2014-10-09 (×2): .075 ug/kg/min via INTRAVENOUS

## 2014-10-09 NOTE — Anesthesia Procedure Notes (Signed)
Procedure Name: Intubation Date/Time: 10/09/2014 1:52 PM Performed by: Danley Danker L Patient Re-evaluated:Patient Re-evaluated prior to inductionOxygen Delivery Method: Circle system utilized Preoxygenation: Pre-oxygenation with 100% oxygen Intubation Type: IV induction, Rapid sequence and Cricoid Pressure applied Laryngoscope Size: Glidescope and 4 Grade View: Grade I Tube type: Parker flex tip Tube size: 7.5 mm Number of attempts: 1 Airway Equipment and Method: Stylet Placement Confirmation: ETT inserted through vocal cords under direct vision,  breath sounds checked- equal and bilateral and positive ETCO2 Secured at: 26 cm Tube secured with: Tape Dental Injury: Teeth and Oropharynx as per pre-operative assessment

## 2014-10-09 NOTE — Transfer of Care (Signed)
Immediate Anesthesia Transfer of Care Note  Patient: Glenn Lyons  Procedure(s) Performed: Procedure(s): ESOPHAGOGASTRODUODENOSCOPY (EGD) (N/A)  Patient Location: PACU and Endoscopy Unit  Anesthesia Type:General  Level of Consciousness: awake, alert  and oriented  Airway & Oxygen Therapy: Patient Spontanous Breathing and Patient connected to face mask oxygen  Post-op Assessment: Report given to RN and Post -op Vital signs reviewed and stable  Post vital signs: Reviewed and stable  Last Vitals:  Filed Vitals:   10/09/14 1249  BP: 159/84  Pulse: 60  Temp: 37 C  Resp: 22    Complications: No apparent anesthesia complications

## 2014-10-09 NOTE — Progress Notes (Signed)
Pt returned to unit with ENDO staff. Pt alert and oriented and in NAD. Pt give 1 percocet and VS taken. VSS. Pt resting watching TV at this time. No needs.

## 2014-10-09 NOTE — Op Note (Signed)
[facility] [place], [address]   ENDOSCOPY PROCEDURE REPORT  PATIENT: Glenn, Lyons  MR#: [id number] BIRTHDATE: 27-Apr-1956 , 59  yrs. old GENDER: male ENDOSCOPIST: Gatha Mayer, MD, [facility] PROCEDURE DATE:  10/09/2014 PROCEDURE:  Esophagoscopy   + submucosal injection of Botox ASA CLASS:     Class III INDICATIONS:  Botox injection to treat Achalasia. MEDICATIONS: Per Anesthesia TOPICAL ANESTHETIC: none  DESCRIPTION OF PROCEDURE: After the risks benefits and alternatives of the procedure were thoroughly explained, informed consent was obtained.  The    endoscope was introduced through the mouth and advanced to the stomach antrum , Without limitations.  The instrument was slowly withdrawn as the mucosa was fully examined.    1) Stenotic, tight GE junction that opened with gentle pressure - Botox injected 25 U into 4 quadrants each 2) Dilated esophagus, no peristalsis seen 3) normal stomach.  Retroflexed views revealed no abnormalities.     The scope was then withdrawn from the patient and the procedure completed.  COMPLICATIONS: There were no immediate complications.  ENDOSCOPIC IMPRESSION: 1) Stenotic, tight GE junction that opened with gentle pressure - Botox injected 25 U into 4 quadrants each 2) Dilated esophagus, no peristalsis seen 3) normal stomach  RECOMMENDATIONS: Full liquids then advance to dysphagia 3 diet Home today/tomorrow per hospitalist Ask patient to call me in 1-2 weeks with update  eSigned:  Gatha Mayer, MD, [facility] 10/09/2014 2:18 PM    C

## 2014-10-09 NOTE — Progress Notes (Signed)
Pt wearing nocturnal CPAP.  Pt using his machine and set-up from home.  Pt states that he doesn't need anything right now for his CPAP and is resting comfortably.  RT will continue to monitor as needed.

## 2014-10-09 NOTE — Progress Notes (Signed)
Patient ID: Glenn Lyons, male   DOB: 02/23/1956, 59 y.o.   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 Jude pacemaker, follows with Dr. Klein). Pt was admitted with fever, tachycardia, hypotension after he has had routine colonoscopy. CXR on 10/05/14 showed hazy, central pulmonary opacities with bronchial wall thickening which may indicated pulmonary edema or viral  / atypical infection. He was started on Unasyn and azithromycin for possible sepsis due to aspiration pneumonia.   Barrier to discharge: Patient initially reported no difficulty swallowing however his wife at the bedside did say he does have ongoing difficulty swallowing and then patient confirmed that he may have difficulty swallowing. GI will do EGD possibly today. Hopefully he will be going home 10/10/2014.  Assessment/Plan:    Principal Problem: Acute hypoxic respiratory failure / Aspiration pneumonia - Chest x-ray on 10/08/2014 shows areas of interstitial and patchy alveolar opacities more on the left than on the right side but overall stable. Concern for either edema or multifocal pneumonia. - Patient was initially on Unasyn and azithromycin. Azithromycin was stopped after he has received this for 4 days. He is still on Unasyn. -Blood cultures so far show no growth to date. - Influenza PCR negative. Legionella and strep pneumonia negative   Active Problems: Difficulty swallowing -Plan for EGD today. Appreciate GI recommendations.  Sepsis due to pneumonia / Leukocytosis  - Sepsis criteria met on admission; fever, tachycardia, leukocytosis. Source of infection likely aspiration pneumonia. - Pt was on Unasyn and  azithromycin. Azithromycin stopped 10/08/2014. -Patient is currently on Unasyn only.  Oliguria on admission - Likely due to acute anesthesia.  - Resolved.  Obstructive sleep apnea - Continue nighttime CPAP  Sinus bradycardia - Status post pacemaker.  - Stable     DVT Prophylaxis  - SCD's bilaterally   Code Status: Full.  Family Communication:  plan of care discussed with the patient and his wife at the bedside Disposition Plan: Home 10/10/2014.  IV access:  Peripheral IV  Procedures and diagnostic studies:    Dg Chest 2 View 10/06/2014  1. Asymmetric infiltrates or edema, worse on the left and improved on the right since previous exam.     Dg Chest Port 1 View 10/05/2014 Hazy central pulmonary opacities with bronchial wall thickening which may indicate pulmonary edema. Viral/atypical infection could appear similar.     Medical Consultants:  Gastroenterology   Other Consultants:  None   IAnti-Infectives:   Unasyn 10/05/2014 --> Azithromycin 10/05/2014 --> 10/08/2014   DEVINE, ALMA, MD  [facility] Pager [phone]  Time spent in minutes: 15 minutes  If 7PM-7AM, please contact night-coverage [email or web address] Password [email or web address] 10/09/2014, 2:05 PM   LOS: 3 days    HPI/Subjective: No acute overnight events. Although patient initially reported he does not really have difficulty swallowing his wife says he still has difficulty swallowing and then the patient confirmed that he does have ongoing difficulty swallowing.  Objective: Filed Vitals:   10/09/14 0600 10/09/14 0712 10/09/14 0723 10/09/14 1249  BP: 139/62  148/65 159/84  Pulse: 60  60 60  Temp: 99.3 F (37.4 C) 98.8 F (37.1 C)  98.6 F (37 C)  TempSrc: Oral Oral Oral Oral  Resp: 18  25 22  Height:        Weight:      SpO2: 94%  91% 91%    Intake/Output Summary (Last 24 hours) at 10/09/14 1405 Last data filed at 10/09/14 0954  Gross per 24 hour  Intake   1130 ml  Output    300 ml  Net    830 ml     Exam:   General:  Pt is awake, alert  Cardiovascular: Rate controlled, appreciate S1-S2  Respiratory: No wheezing, no crackles  Abdomen: Nontender, nondistended abdomen, appreciate bowel sounds  Extremities: No leg swelling, pulses palpable  Neuro: No focal deficits  Data Reviewed: Basic Metabolic Panel:  Recent Labs Lab 10/05/14 1720 10/06/14 0420  NA 139 135  K 4.7 4.1  CL 102 106  CO2 27 26  GLUCOSE 153* 145*  BUN 8 12  CREATININE 1.06 0.98  CALCIUM 8.9 8.2*   Liver Function Tests:  Recent Labs Lab 10/05/14 1720 10/06/14 0420  AST 24 17  ALT 26 21  ALKPHOS 69 59  BILITOT 1.2 1.6*  PROT 6.3* 5.9*  ALBUMIN 3.5 3.1*   No results for input(s): LIPASE, AMYLASE in the last 168 hours. No results for input(s): AMMONIA in the last 168 hours. CBC:  Recent Labs Lab 10/05/14 1720 10/06/14 0420  WBC 14.8* 18.8*  HGB 14.0 12.3*  HCT 42.7 37.7*  MCV 91.4 91.5  PLT 200 207   Cardiac Enzymes:  Recent Labs Lab 10/05/14 1310  TROPONINI <0.03   BNP: Invalid input(s): POCBNP CBG:  Recent Labs Lab 10/05/14 1333  GLUCAP 115*    Recent Results (from the past 240 hour(s))  Culture, blood (routine x 2)     Status: None (Preliminary result)   Collection Time: 10/05/14  5:40 PM  Result Value Ref Range Status   Specimen Description BLOOD RIGHT HAND  Final   Special Requests BOTTLES DRAWN AEROBIC AND ANAEROBIC 10CC  Final   Culture   Final           BLOOD CULTURE RECEIVED NO GROWTH TO DATE CULTURE WILL BE HELD FOR 5 DAYS BEFORE ISSUING A FINAL NEGATIVE REPORT Performed at [facility]    Report Status PENDING  Incomplete  Culture, blood (routine x 2)     Status: None (Preliminary result)   Collection Time: 10/05/14  5:45 PM  Result Value Ref Range Status   Specimen Description BLOOD RIGHT ARM  Final   Special Requests BAA 10CC   Final   Culture   Final           BLOOD CULTURE RECEIVED NO GROWTH TO DATE CULTURE WILL BE HELD FOR 5 DAYS  BEFORE ISSUING A FINAL NEGATIVE REPORT Performed at [facility]    Report Status PENDING  Incomplete     Scheduled Meds: . [MAR Hold] ampicillin-sulbactam (UNASYN) IV  3 g Intravenous 4 times per day  . [MAR Hold] aspirin EC  81 mg Oral Daily  . [MAR Hold] traMADol-acetaminophen  1 tablet Oral Once   Continuous Infusions: . sodium chloride 10 mL/hr at 10/07/14 0802         

## 2014-10-09 NOTE — Progress Notes (Signed)
Pt remains in ENDO at this time. Will assess upon arrival back to unit.

## 2014-10-09 NOTE — Anesthesia Postprocedure Evaluation (Signed)
Anesthesia Post Note  Patient: Glenn Lyons  Procedure(s) Performed: Procedure(s) (LRB): ESOPHAGOGASTRODUODENOSCOPY (EGD) (N/A)  Anesthesia type: General  Patient location: PACU  Post pain: Pain level controlled  Post assessment: Post-op Vital signs reviewed  Last Vitals: BP 165/82 mmHg  Pulse 60  Temp(Src) 37.2 C (Oral)  Resp 18  Ht 5\' 11"  (1.803 m)  Wt 355 lb (161.027 kg)  BMI 49.53 kg/m2  SpO2 90%  Post vital signs: Reviewed  Level of consciousness: sedated  Complications: No apparent anesthesia complications

## 2014-10-10 DIAGNOSIS — I10 Essential (primary) hypertension: Secondary | ICD-10-CM

## 2014-10-10 MED ORDER — ACETAMINOPHEN 325 MG PO TABS: 650.0000 mg | ORAL_TABLET | Freq: Four times a day (QID) | ORAL | Status: DC | PRN

## 2014-10-10 MED ORDER — LEVOFLOXACIN 500 MG PO TABS: 500.0000 mg | ORAL_TABLET | Freq: Every day | ORAL | Status: DC

## 2014-10-10 MED ORDER — TRAMADOL-ACETAMINOPHEN 37.5-325 MG PO TABS: 1.0000 | ORAL_TABLET | Freq: Once | ORAL | Status: DC

## 2014-10-10 NOTE — Progress Notes (Signed)
Pt discharged to home. DC instructions given. Prescriptions x 2 also given. No concerns voiced.  Left unit in wheelchair pushed by nurse tech.left in good condition. Pt encouraged to call MD's office with any problems or concerns. Voiced understanding.  Vwilliams,rn.

## 2014-10-10 NOTE — Progress Notes (Signed)
Referral received for Bipap at home. Pt has his own CPAP machine. MD dc instructions are to continue using CPAP at night.  Jonnie Finner RN CCM Case Mgmt phone [phone]

## 2014-10-10 NOTE — Discharge Summary (Signed)
Physician Discharge Summary  Glenn Lyons XWR:[id number] DOB: 10-May-1956 DOA: 10/05/2014  PCP: Elsie Stain, MD  Admit date: 10/05/2014 Discharge date: 10/10/2014  Recommendations for Outpatient Follow-up:  1. Follow up with GI per sch appt 2. Follow up with PCP per scheduled appt 3. 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 Jude pacemaker, follows with Dr. Caryl Comes). Pt was admitted with fever, tachycardia, hypotension after he has had routine colonoscopy. CXR on 10/05/14 showed hazy, central pulmonary opacities with bronchial wall thickening which may indicated pulmonary edema or viral / atypical infection. He was started on Unasyn and azithromycin for possible sepsis due to aspiration pneumonia.   Pt also had EGD 10/09/2014 for achalasia. He received botox injection. Diet is soft today which he tolerated well.   Assessment/Plan:    Principal Problem: Acute hypoxic respiratory failure / Aspiration pneumonia - Patient admitted with hypoxia. Most recent chest x-ray on 10/08/2014 showed areas of interstitial and patchy alveolar opacities more on the left than on the right but overall stable study. - Patient was on Unasyn and azithromycin. Patient took azithromycin for 4 days. The Unasyn will be stopped prior to discharge. - Patient will take Levaquin on discharge for 7 days. -Blood cultures so far show no growth to date. - Influenza PCR negative. Legionella and strep pneumonia negative   Active  Problems: Difficulty swallowing / Achalasia  - Status post EGD and Botox injection. Feels better. - Soft diet today.  Sepsis due to pneumonia / Leukocytosis  - Sepsis criteria met on admission; fever, tachycardia, leukocytosis. Source of infection likely aspiration pneumonia. - Blood cultures so far show no growth. Antibiotics as above, Levaquin on discharge for 7 days.  Oliguria on admission - Likely due to acute anesthesia.  - Resolved.  Obstructive sleep apnea - Continue nighttime CPAP  Sinus bradycardia - Status post pacemaker.  - Stable   Essential hypertension - Resume Lasix on discharge per prior home regimen.    DVT Prophylaxis  - SCD's bilaterally   Code Status: Full.    IV access:  Peripheral IV  Procedures and diagnostic studies:   Dg Chest 2 View 10/06/2014 1. Asymmetric infiltrates or edema, worse on the left and improved on the right since previous exam.   Dg Chest Port 1 View 10/05/2014 Hazy central pulmonary opacities with bronchial wall thickening which may indicate pulmonary edema. Viral/atypical infection could appear similar.   Medical Consultants:  Gastroenterology   Other Consultants:  None   IAnti-Infectives:   Unasyn 10/05/2014 --> 10/10/2014  Azithromycin 10/05/2014 --> 10/08/2014   Signed:  Leisa Lenz, MD  [facility] 10/10/2014, 7:32 AM  Pager #: [phone]  Time spent in minutes: more than 30 minutes   Discharge Exam: Filed Vitals:   10/10/14 0500  BP: 173/78  Pulse: 60  Temp: 98.2 F (36.8 C)  Resp: 18   Filed Vitals:   10/09/14 1514 10/09/14 1606 10/09/14 2145 10/10/14 0500  BP: 160/80 151/76 161/64 173/78  Pulse: 61 60 59 60  Temp: 98.9 F (37.2 C)  99.5  F (37.5 C) 98.2 F (36.8 C)  TempSrc: Oral  Oral Oral  Resp: '18 18 18 18  ' Height:      Weight:      SpO2: 92% 95% 93% 95%    General: Pt is alert, follows commands appropriately, not in acute distress Cardiovascular: Regular  rate and rhythm, S1/S2 +, no murmurs Respiratory: Clear to auscultation bilaterally, no wheezing, no crackles, no rhonchi Abdominal: Soft, non tender, non distended, bowel sounds +, no guarding Extremities: no edema, no cyanosis, pulses palpable bilaterally DP and PT Neuro: Grossly nonfocal  Discharge Instructions  Discharge Instructions    Call MD for:  difficulty breathing, headache or visual disturbances    Complete by:  As directed      Call MD for:  persistant nausea and vomiting    Complete by:  As directed      Call MD for:  severe uncontrolled pain    Complete by:  As directed      Diet - low sodium heart healthy    Complete by:  As directed      Increase activity slowly    Complete by:  As directed             Medication List    STOP taking these medications        Sod Picosulfate-Mag Ox-Cit Acd 10-3.5-12 MG-GM-GM Pack  Commonly known as:  PREPOPIK      TAKE these medications        acetaminophen 325 MG tablet  Commonly known as:  TYLENOL  Take 2 tablets (650 mg total) by mouth every 6 (six) hours as needed (headache, shoulder pain).     aspirin 81 MG tablet  Take 81 mg by mouth daily.     cetirizine 10 MG tablet  Commonly known as:  ZYRTEC  Take 20 mg by mouth daily.     cholecalciferol 1000 UNITS tablet  Commonly known as:  VITAMIN D  Take 1,000 Units by mouth daily.     citalopram 20 MG tablet  Commonly known as:  CELEXA  TAKE 1 TABLET (20 MG TOTAL) BY MOUTH DAILY.     furosemide 20 MG tablet  Commonly known as:  LASIX  TAKE 1 TO 2 TABLETS EVERY DAY, USE AS LITTLE AS POSSIBLE.     ibuprofen 200 MG tablet  Commonly known as:  ADVIL,MOTRIN  Take 200 mg by mouth as needed.     KRILL OIL PO  Take 1 capsule by mouth daily.     levofloxacin 500 MG tablet  Commonly known as:  LEVAQUIN  Take 1 tablet (500 mg total) by mouth daily.     multivitamin with minerals Tabs tablet  Take 1 tablet by mouth daily.     traMADol-acetaminophen 37.5-325 MG  per tablet  Commonly known as:  ULTRACET  Take 1 tablet by mouth once.            Follow-up Information    Follow up with Silvano Rusk, MD. Call in 1 week.   Specialty:  Gastroenterology   Why:  Let Dr. Carlean Purl know how swallowing is going   Contact information:   [address] [place] [address] [phone]       Follow up with Elsie Stain, MD. Schedule an appointment as soon as possible for a visit in 1 week.   Specialty:  Family Medicine   Why:  Follow up appt after recent hospitalization   Contact information:   [address]  [place] [address] [phone]        The results of significant diagnostics from this hospitalization (including imaging, microbiology, ancillary and laboratory) are listed below for reference.    Significant Diagnostic Studies: Dg Chest 2 View  10/06/2014   CLINICAL DATA:  Fever and cough.  EXAM: CHEST - 2 VIEW  COMPARISON:  the previous day's study  FINDINGS: Progressive interstitial and airspace opacities throughout the left lung. Improved aeration on the right with some patchy perihilar airspace opacities remaining. Mild cardiomegaly stable. Right subclavian transvenous pacemaker stable. No definite effusion.  No pneumothorax. Minimal spondylitic changes at the thoracolumbar junction.  IMPRESSION: 1. Asymmetric infiltrates or edema, worse on the left and improved on the right since previous exam.   Electronically Signed   By: Lucrezia Europe M.D.   On: 10/06/2014 14:38   Dg Chest Port 1 View  10/08/2014   CLINICAL DATA:  Cough and fever  EXAM: PORTABLE CHEST - 1 VIEW  COMPARISON:  Oct 06, 2014  FINDINGS: There is patchy airspace consolidation throughout much of the left lung, stable. On the right, there is patchy interstitial and alveolar opacity, essentially stable. No new opacities are identified. Heart is enlarged with pulmonary vascularity within normal limits. Pacemaker leads are attached to the right atrium and right  ventricle. No adenopathy. No pneumothorax.  IMPRESSION: Areas of interstitial and patchy alveolar opacity, more on the left than on the right, stable. Suspect atypical appearance of edema with congestive heart failure, although multifocal pneumonia may present in this manner. Both entities may exist concurrently. No appreciable change compared to study from 2 days prior.   Electronically Signed   By: Lowella Grip III M.D.   On: 10/08/2014 08:21   Dg Chest Port 1 View  10/05/2014   CLINICAL DATA:  Shortness of breath after colonoscopy  EXAM: PORTABLE CHEST - 1 VIEW  COMPARISON:  07/16/2013  FINDINGS: Right-sided dual lead pacer in place. Mild enlargement of the cardiomediastinal silhouette is identified. There are patchy ill-defined hazy pulmonary opacities centrally with central bronchial wall thickening bilaterally. No pleural effusion. No pneumothorax. No acute osseus and.  IMPRESSION: Hazy central pulmonary opacities with bronchial wall thickening which may indicate pulmonary edema. Viral/atypical infection could appear similar.   Electronically Signed   By: Conchita Paris M.D.   On: 10/05/2014 13:29    Microbiology: Recent Results (from the past 240 hour(s))  Culture, blood (routine x 2)     Status: None (Preliminary result)   Collection Time: 10/05/14  5:40 PM  Result Value Ref Range Status   Specimen Description BLOOD RIGHT HAND  Final   Special Requests BOTTLES DRAWN AEROBIC AND ANAEROBIC 10CC  Final   Culture   Final           BLOOD CULTURE RECEIVED NO GROWTH TO DATE CULTURE WILL BE HELD FOR 5 DAYS BEFORE ISSUING A FINAL NEGATIVE REPORT Performed at [facility]    Report Status PENDING  Incomplete  Culture, blood (routine x 2)     Status: None (Preliminary result)   Collection Time: 10/05/14  5:45 PM  Result Value Ref Range Status   Specimen Description BLOOD RIGHT ARM  Final   Special Requests BAA 10CC   Final   Culture   Final           BLOOD CULTURE RECEIVED NO GROWTH  TO DATE CULTURE WILL BE HELD FOR 5 DAYS BEFORE ISSUING A FINAL NEGATIVE REPORT Performed at [facility]  Report Status PENDING  Incomplete     Labs: Basic Metabolic Panel:  Recent Labs Lab 10/05/14 1720 10/06/14 0420  NA 139 135  K 4.7 4.1  CL 102 106  CO2 27 26  GLUCOSE 153* 145*  BUN 8 12  CREATININE 1.06 0.98  CALCIUM 8.9 8.2*   Liver Function Tests:  Recent Labs Lab 10/05/14 1720 10/06/14 0420  AST 24 17  ALT 26 21  ALKPHOS 69 59  BILITOT 1.2 1.6*  PROT 6.3* 5.9*  ALBUMIN 3.5 3.1*   No results for input(s): LIPASE, AMYLASE in the last 168 hours. No results for input(s): AMMONIA in the last 168 hours. CBC:  Recent Labs Lab 10/05/14 1720 10/06/14 0420  WBC 14.8* 18.8*  HGB 14.0 12.3*  HCT 42.7 37.7*  MCV 91.4 91.5  PLT 200 207   Cardiac Enzymes:  Recent Labs Lab 10/05/14 1310  TROPONINI <0.03   BNP: BNP (last 3 results) No results for input(s): BNP in the last 8760 hours.  ProBNP (last 3 results) No results for input(s): PROBNP in the last 8760 hours.  CBG:  Recent Labs Lab 10/05/14 1333  GLUCAP 115*

## 2014-10-12 ENCOUNTER — Encounter (HOSPITAL_COMMUNITY): Payer: Self-pay | Admitting: Internal Medicine

## 2014-10-12 LAB — CULTURE, BLOOD (ROUTINE X 2)
Culture: NO GROWTH
Culture: NO GROWTH

## 2014-10-20 ENCOUNTER — Encounter: Payer: Self-pay | Admitting: Internal Medicine

## 2014-10-20 DIAGNOSIS — Z86010 Personal history of colonic polyps: Secondary | ICD-10-CM

## 2014-10-20 NOTE — Progress Notes (Signed)
Quick Note:  Distal ssp's Repeat colonoscopy 3 years ______

## 2014-10-22 NOTE — Progress Notes (Signed)
Quick Note:  Needs 3 year colon recall I have called him ______

## 2014-11-16 ENCOUNTER — Ambulatory Visit (INDEPENDENT_AMBULATORY_CARE_PROVIDER_SITE_OTHER): Payer: BLUE CROSS/BLUE SHIELD | Admitting: Family Medicine

## 2014-11-16 ENCOUNTER — Encounter: Payer: Self-pay | Admitting: Family Medicine

## 2014-11-16 VITALS — BP 124/72 | HR 72 | Temp 97.6°F | Wt 360.8 lb

## 2014-11-16 DIAGNOSIS — L918 Other hypertrophic disorders of the skin: Secondary | ICD-10-CM | POA: Diagnosis not present

## 2014-11-16 DIAGNOSIS — J690 Pneumonitis due to inhalation of food and vomit: Secondary | ICD-10-CM

## 2014-11-16 NOTE — Progress Notes (Signed)
Pre visit review using our clinic review tool, if applicable. No additional management support is needed unless otherwise documented below in the visit note.  Skin tag removal.    Meds, vitals, and allergies reviewed.   Indication: irritated skin tag  Pt complaints of: irritated skin tag  Location: L lower neck/upper chest  Size: <1cm  Informed consent obtained.  Pt aware of risks not limited to but including infection, bleeding, damage to near by organs.  Prep: etoh  Anesthesia: 1%lidocaine with epi, good effect  Removed with scissors.  No complications. Tolerated well.   Routine postprocedure instructions d/w pt.    Also h/o ASP PNA.  D/w pt.  Now with swallowing much improved after GI procedure.  Less regurgitation.  No fevers, no SOB, now returning to baseline activity and feels well again.    Meds, vitals, and allergies reviewed.   ROS: See HPI.  Otherwise, noncontributory.  GEN: nad, alert and oriented HEENT: mucous membranes moist NECK: supple w/o LA CV: rrr.  PULM: ctab, no inc wob

## 2014-11-16 NOTE — Patient Instructions (Signed)
Take care.  Glad to see you.  Keep the area clean and covered.

## 2014-11-17 DIAGNOSIS — L918 Other hypertrophic disorders of the skin: Secondary | ICD-10-CM | POA: Insufficient documentation

## 2014-11-17 NOTE — Assessment & Plan Note (Signed)
Now with swallowing much improved after GI procedure. Less regurgitation. No fevers, no SOB, now returning to baseline activity and feels well again.  ctab today.

## 2014-11-17 NOTE — Assessment & Plan Note (Signed)
Removed w/o complication.  Routine instructions given to patient. F/u prn.

## 2015-01-05 ENCOUNTER — Encounter: Payer: Self-pay | Admitting: *Deleted

## 2015-01-11 ENCOUNTER — Ambulatory Visit: Payer: Self-pay | Admitting: Pulmonary Disease

## 2015-01-13 ENCOUNTER — Ambulatory Visit: Payer: Self-pay | Admitting: Pulmonary Disease

## 2015-01-14 ENCOUNTER — Ambulatory Visit (INDEPENDENT_AMBULATORY_CARE_PROVIDER_SITE_OTHER): Payer: BC Managed Care – PPO | Admitting: *Deleted

## 2015-01-14 DIAGNOSIS — R001 Bradycardia, unspecified: Secondary | ICD-10-CM

## 2015-01-14 LAB — CUP PACEART INCLINIC DEVICE CHECK
Battery Impedance: 2500 Ohm
Battery Voltage: 2.79 V
Brady Statistic RA Percent Paced: 45 %
Brady Statistic RV Percent Paced: 1 % — CL
Date Time Interrogation Session: 20160818161536
Lead Channel Impedance Value: 326 Ohm
Lead Channel Impedance Value: 692 Ohm
Lead Channel Pacing Threshold Amplitude: 0.75 V
Lead Channel Pacing Threshold Amplitude: 1 V
Lead Channel Pacing Threshold Pulse Width: 0.4 ms
Lead Channel Pacing Threshold Pulse Width: 0.5 ms
Lead Channel Sensing Intrinsic Amplitude: 5 mV
Lead Channel Sensing Intrinsic Amplitude: 9.7 mV
Lead Channel Setting Pacing Amplitude: 2 V
Lead Channel Setting Pacing Pulse Width: 0.5 ms
Lead Channel Setting Sensing Sensitivity: 2.5 mV
Pulse Gen Model: 5816
Pulse Gen Serial Number: 1622388

## 2015-01-14 NOTE — Progress Notes (Signed)
Pacemaker check in clinic. Normal device function. Thresholds, sensing, impedances consistent with previous measurements. Device programmed to maximize longevity. No mode switch or high ventricular rates noted. Device programmed at appropriate safety margins. Histogram distribution appropriate for patient activity level. Device programmed to optimize intrinsic conduction. Estimated longevity 3.25-4.75 years. Patient will follow up with SK on 11-8 @ 1415.

## 2015-02-15 ENCOUNTER — Encounter: Payer: Self-pay | Admitting: Nurse Practitioner

## 2015-02-18 ENCOUNTER — Encounter: Payer: Self-pay | Admitting: Internal Medicine

## 2015-03-16 ENCOUNTER — Telehealth: Payer: Self-pay | Admitting: Family Medicine

## 2015-03-16 NOTE — Telephone Encounter (Signed)
Patient's son is seeing Dr. Damita Dunnings at 12 on Thursday 10/20.  Both Mr and Mrs Thereasa Parkin have been added to the nurse schedule at that time.  Brooke to administer vaccines since she is working with Dr. Damita Dunnings that day.

## 2015-03-16 NOTE — Telephone Encounter (Signed)
Spoke with pt's wife and they both wanted a shingles on Thursday, because they are coming with their son from [place], please open up a spot.

## 2015-03-16 NOTE — Telephone Encounter (Signed)
Wife called and would like shingles vaccine.  Please call [phone]  Thank you

## 2015-03-16 NOTE — Telephone Encounter (Signed)
Barnett Applebaum, Can you open up these slots.  I don't know his spouse's name and I am not sure who will do these injections :)  Many questions came to my mind when I read this.  Thank you!

## 2015-03-17 ENCOUNTER — Ambulatory Visit: Payer: Self-pay | Admitting: Pulmonary Disease

## 2015-03-18 ENCOUNTER — Ambulatory Visit (INDEPENDENT_AMBULATORY_CARE_PROVIDER_SITE_OTHER): Payer: BC Managed Care – PPO

## 2015-03-18 DIAGNOSIS — Z23 Encounter for immunization: Secondary | ICD-10-CM

## 2015-03-23 ENCOUNTER — Telehealth: Payer: Self-pay | Admitting: Internal Medicine

## 2015-03-23 NOTE — Telephone Encounter (Signed)
Wife states pt is having difficulty swallowing again like he did prior to the botox injection. States pt is out of town but will make arrangements to do what Dr. Carlean Purl suggests. Please advise.

## 2015-03-24 NOTE — Telephone Encounter (Signed)
I need guidance on where to put him on the schedule. The first opening is 04/26/15.

## 2015-03-24 NOTE — Telephone Encounter (Signed)
Spoke with Glenn Lyons. The patient will be back in town the week of Thanksgiving. The appointment for 04/26/15 will be fine for now. He will let us know if there are changes.

## 2015-03-24 NOTE — Telephone Encounter (Signed)
I can see him and discuss the options. Beth can you please add him to my schedule, to have him come in sooner as he seems to be very symptomatic

## 2015-03-24 NOTE — Telephone Encounter (Signed)
I am ccing Dr. Silverio Decamp to see if she would evaluate him - if she thinks repeat Botox then I can do it (or her) but wonder if he is a candidate for balloon dilation.  If Veena willing to see him then explain to him that I would like him to see her since she is her and is more knowledgeable and experiences in treatment of achalasia than I am

## 2015-04-05 ENCOUNTER — Telehealth: Payer: Self-pay | Admitting: Gastroenterology

## 2015-04-05 NOTE — Telephone Encounter (Signed)
We first discussed this patient 03/23/15. His wife called because he was experiencing swallowing issues again. Dr Carlean Purl asked you to be involved due to the achalasia. "I am ccing Dr. Silverio Decamp to see if she would evaluate him - if she thinks repeat Botox then I can do it (or her) but wonder if he is a candidate for balloon dilation. If Veena willing to see him then explain to him that I would like him to see her since she is her and is more knowledgeable and experiences in treatment of achalasia than I am." The wife calls again for the patient because he works out of state. He wants to meet you and do the procedure on the same day. He wants to remain a patient of Dr Carlean Purl. He can only do this on a Monday starting 05/17/15. Botox injections can only be done at the hospital. I am unsure on how I can schedule this except to put him on your hospital week.  Please advise me.

## 2015-04-05 NOTE — Telephone Encounter (Signed)
Spouse Arbie Cookey called to advice [place] that in 2006 or 2007 Jaydis had a EGD in [place], [place]. Also sometime in 2009 or 2010 [facility] and another EGD at [facility]

## 2015-04-05 NOTE — Telephone Encounter (Signed)
Can you please obtain all the records, so I can review them ahead of time and then we can decide how to proceed. Thanks

## 2015-04-05 NOTE — Telephone Encounter (Signed)
I cannot get records from prior to 2009 without a signature. He is out of state and does not come back into town. The other records are in the chart, but I printed them out. They are in your in-box. The reports are tucked away under "procedures" and "media".

## 2015-04-06 ENCOUNTER — Encounter: Payer: BC Managed Care – PPO | Admitting: Internal Medicine

## 2015-04-12 ENCOUNTER — Ambulatory Visit: Payer: Self-pay | Admitting: Family Medicine

## 2015-04-12 ENCOUNTER — Encounter: Payer: Self-pay | Admitting: Family Medicine

## 2015-04-12 ENCOUNTER — Ambulatory Visit (INDEPENDENT_AMBULATORY_CARE_PROVIDER_SITE_OTHER): Payer: BC Managed Care – PPO | Admitting: Family Medicine

## 2015-04-12 VITALS — BP 138/80 | HR 59 | Temp 98.3°F | Ht 71.25 in | Wt 368.5 lb

## 2015-04-12 DIAGNOSIS — M1711 Unilateral primary osteoarthritis, right knee: Secondary | ICD-10-CM

## 2015-04-12 DIAGNOSIS — M1712 Unilateral primary osteoarthritis, left knee: Secondary | ICD-10-CM

## 2015-04-12 NOTE — Progress Notes (Signed)
Pre visit review using our clinic review tool, if applicable. No additional management support is needed unless otherwise documented below in the visit note. 

## 2015-04-12 NOTE — Progress Notes (Signed)
Dr. Frederico Hamman T. Luvena Wentling, MD, [facility] and Sports Medicine [place], [address] Phone: [phone] Fax: [phone]  04/12/2015  Patient: Glenn Lyons, MRN: [id number], DOB: [birth year], 59 y.o.  Primary Physician:  Elsie Stain, MD   Chief Complaint  Patient presents with  . Knee Pain    Bilateral-Right is more painful   Subjective:   Glenn Lyons is a 59 y.o. very pleasant male patient who presents with the following:  Very pleasant gentleman with a BMI of 51 presents with ongoing acute on chronic right knee pain with additional left-sided intermittent knee pain and mechanical symptoms.  On the left, he had a presumptive meniscal tear a few years ago, but he has a pacemaker, and this was not confirmed by imaging.  He elected to pursue conservative management without operative intervention.  Right-sided knee is been dull and aching and has been hurting him a lot in the last 10-14 days.  Bothers him when he sits for long periods of time, and when he drives.  Over the weekend, it is gotten quite a bit better when he rested it, now it is about 75% better compared to how was last week.  He does take some anti-inflammatories, ice, icing hot, as well as some Tylenol.  These all seem to make it better somewhat.  Past Medical History, Surgical History, Social History, Family History, Problem List, Medications, and Allergies have been reviewed and updated if relevant.  Patient Active Problem List   Diagnosis Date Noted  . Skin tag 11/17/2014  . Essential hypertension   . Chills (without fever) 10/05/2014  . Acute respiratory failure with hypoxia ([place]) 10/05/2014  . Aspiration pneumonia ([place]) 10/05/2014  . Hx of colonic polyps   . Fatigue 08/04/2014  . Rash and nonspecific skin eruption 10/21/2013  . Cough 05/26/2013  . Morbid obesity with BMI of 50.0-59.9, adult ([place]) 05/24/2013  . History of colonic polyps 04/29/2013  . Anxiety state,  unspecified 03/10/2013  . Pain in joint, shoulder region 02/08/2013  . Sinus bradycardia /pauses   . ED (erectile dysfunction) 02/21/2012  . Cutaneous skin tags 01/31/2012  . Edema 01/31/2012  . Skin lesion 01/08/2012  . Knee pain, left 06/15/2011  . Paresthesia 01/08/2011  . Achalasia 08/17/2010  . DEPRESSION 01/21/2010  . OSTEOARTHRITIS 01/21/2010  . ANGINA, HX OF 01/21/2010  . PACEMAKER, [employer] Judes 01/21/2010  . BACK PAIN 01/05/2010  . GERD 12/07/2009  . OSA (obstructive sleep apnea) 12/07/2009    Past Medical History  Diagnosis Date  . Osteoarthrosis, unspecified whether generalized or localized, unspecified site   . Achalasia     with prev eval at [facility].  No intervention as of 2012  . Cardiac pacemaker St. Jude     ERI 2008  . Depressive disorder, not elsewhere classified   . Esophageal reflux   . Obstructive sleep apnea   . Osteoarthrosis, unspecified whether generalized or localized, unspecified site   . Stricture and stenosis of esophagus   . Backache, unspecified   . Morbid obesity ([place])   . Sinus bradycardia /pauses   . Presence of permanent cardiac pacemaker     St. Jude- Dr. Caryl Comes follows -device Check 07-06-14  . Aspiration pneumonia ([place]) 10/05/2014    Past Surgical History  Procedure Laterality Date  . Pacemaker insertion    . Colonoscopy w/ polypectomy  11/01/2010  . Esophageal dilation    . Colonoscopy with propofol N/A 10/05/2014    Procedure: COLONOSCOPY WITH  PROPOFOL;  Surgeon: Gatha Mayer, MD;  Location: [facility];  Service: Endoscopy;  Laterality: N/A;  . Esophagogastroduodenoscopy N/A 10/09/2014    Procedure: ESOPHAGOGASTRODUODENOSCOPY (EGD);  Surgeon: Gatha Mayer, MD;  Location: [facility];  Service: Endoscopy;  Laterality: N/A;    Social History   Social History  . Marital Status: Married    Spouse Name: N/A  . Number of Children: 2  . 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     Social History  Main Topics  . Smoking status: Former Smoker -- 1.00 packs/day for 20 years    Types: Cigarettes    Quit date: 05/29/2004  . Smokeless tobacco: Never Used  . Alcohol Use: Yes     Comment: rarely  . Drug Use: No  . Sexual Activity: Not on file   Other Topics Concern  . Not on file   Social History Narrative   From [place]   Married    Family History  Problem Relation Age of Onset  . Heart attack Father   . Heart disease Father   . Cancer Father     multiple myeloma  . Hypertension Father   . Hypertension Mother     Allergies  Allergen Reactions  . Morphine     REACTION: Heart stopped.    Medication list reviewed and updated in full in [place].  GEN: No fevers, chills. Nontoxic. Primarily MSK c/o today. MSK: Detailed in the HPI GI: tolerating PO intake without difficulty Neuro: No numbness, parasthesias, or tingling associated. Otherwise the pertinent positives of the ROS are noted above.   Objective:   BP 138/80 mmHg  Pulse 59  Temp(Src) 98.3 F (36.8 C) (Oral)  Ht 5' 11.25" (1.81 m)  Wt 368 lb 8 oz (167.151 kg)  BMI 51.02 kg/m2   GEN: WDWN, NAD, Non-toxic, Alert & Oriented x 3 HEENT: Atraumatic, Normocephalic.  Ears and Nose: No external deformity. EXTR: No clubbing/cyanosis/edema NEURO: Normal gait.  PSYCH: Normally interactive. Conversant. Not depressed or anxious appearing.  Calm demeanor.   Knee:  B Gait: Normal heel toe pattern ROM: 0 - 120 Effusion: minimal - mild Echymosis or edema: none Patellar tendon NT Painful PLICA: neg Patellar grind: negative Medial and lateral patellar facet loading: negative medial and lateral joint lines: mild tenderness Mcmurray's neg Flexion-pinch neg Varus and valgus stress: stable Lachman: neg Ant and Post drawer: neg Hip abduction, IR, ER: WNL Hip flexion str: 5/5 Hip abd: 5/5 Quad: 5/5 VMO atrophy:No Hamstring concentric and eccentric: 5/5   Radiology: No results  found.  Assessment and Plan:   Primary osteoarthritis of right knee  Morbid obesity, unspecified obesity type ([place])  Primary osteoarthritis of left knee  Bilateral knee osteoarthritis in a patient with a BMI of 51, long-standing obesity.  Probable left-sided meniscal tear additionally.  At this point, he is mostly better, approximately 75-80%.  I would continue with conservative management including icing, anti-inflammatories, and Tylenol, as well as resting as appropriate.  If his knee or knees decompensate, then we could inject his knees in the future.  The patient definitely understands that he needs to lose weight, and this is contributing to his joint issues.  Follow-up: prn  Signed,  Shondale Quinley T. Lakara Weiland, MD   Patient's Medications  New Prescriptions   No medications on file  Previous Medications   ACETAMINOPHEN (TYLENOL) 325 MG TABLET    Take  2 tablets (650 mg total) by mouth every 6 (six) hours as needed (headache, shoulder pain).   ASPIRIN 81 MG TABLET    Take 81 mg by mouth daily.     CETIRIZINE (ZYRTEC) 10 MG TABLET    Take 20 mg by mouth daily.    CHOLECALCIFEROL (VITAMIN D) 1000 UNITS TABLET    Take 1,000 Units by mouth daily.   CITALOPRAM (CELEXA) 20 MG TABLET    TAKE 1 TABLET (20 MG TOTAL) BY MOUTH DAILY.   FUROSEMIDE (LASIX) 20 MG TABLET    TAKE 1 TO 2 TABLETS EVERY DAY, USE AS LITTLE AS POSSIBLE.   IBUPROFEN (ADVIL,MOTRIN) 200 MG TABLET    Take 200 mg by mouth as needed.     KRILL OIL PO    Take 1 capsule by mouth daily.   MULTIPLE VITAMIN (MULTIVITAMIN WITH MINERALS) TABS TABLET    Take 1 tablet by mouth daily.  Modified Medications   No medications on file  Discontinued Medications   TRAMADOL-ACETAMINOPHEN (ULTRACET) 37.5-325 MG PER TABLET    Take 1 tablet by mouth once.

## 2015-04-13 ENCOUNTER — Telehealth: Payer: Self-pay | Admitting: Gastroenterology

## 2015-04-16 NOTE — Telephone Encounter (Signed)
Ok, please keep me updated on how patient wants to proceed.  thanks

## 2015-04-16 NOTE — Telephone Encounter (Signed)
I have not been able to satisfy the needs of this patient to get him in with Dr Silverio Decamp. He doesn't want to "start over"  And he views seeing Dr Silverio Decamp for consult on this as starting over. There have been multiple conversations with his spouse, who is the go-between. He is scheduled to see Dr Silverio Decamp on 05/11/15 but this will have to be cancelled because the patient has a permanent job in [place].  The wife says he wants to go straight to procedure without a consult OV. Is Dr Silverio Decamp doing the EGD also?

## 2015-04-16 NOTE — Telephone Encounter (Signed)
I will talk to the wife  Maybe he should be seen in [facility]  If he just wants the Botox again i may just do it

## 2015-04-26 ENCOUNTER — Ambulatory Visit: Payer: Self-pay | Admitting: Gastroenterology

## 2015-04-27 NOTE — Telephone Encounter (Signed)
I spoke to him and explained why I wanted him to see Dr. Silverio Decamp - he is agreeable - however he is not available 12/13 as his wife thought.   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  He is swallowing better by modifying his diet at present.

## 2015-04-27 NOTE — Telephone Encounter (Signed)
I left message for him to call me on my direct line at desk

## 2015-04-28 ENCOUNTER — Other Ambulatory Visit: Payer: Self-pay

## 2015-05-11 ENCOUNTER — Ambulatory Visit: Payer: Self-pay | Admitting: Gastroenterology

## 2015-05-19 ENCOUNTER — Encounter: Payer: Self-pay | Admitting: Family Medicine

## 2015-05-19 ENCOUNTER — Ambulatory Visit (INDEPENDENT_AMBULATORY_CARE_PROVIDER_SITE_OTHER): Payer: BC Managed Care – PPO | Admitting: Family Medicine

## 2015-05-19 VITALS — BP 110/66 | HR 64 | Temp 97.7°F | Ht 71.25 in | Wt 371.5 lb

## 2015-05-19 DIAGNOSIS — B351 Tinea unguium: Secondary | ICD-10-CM | POA: Diagnosis not present

## 2015-05-19 DIAGNOSIS — M129 Arthropathy, unspecified: Secondary | ICD-10-CM

## 2015-05-19 DIAGNOSIS — M170 Bilateral primary osteoarthritis of knee: Secondary | ICD-10-CM

## 2015-05-19 DIAGNOSIS — Z23 Encounter for immunization: Secondary | ICD-10-CM | POA: Diagnosis not present

## 2015-05-19 MED ORDER — METHYLPREDNISOLONE ACETATE 40 MG/ML IJ SUSP
80.0000 mg | Freq: Once | INTRAMUSCULAR | Status: AC
  Administered 2015-05-19: 80 mg via INTRA_ARTICULAR

## 2015-05-19 MED ORDER — CICLOPIROX 8 % EX SOLN: Freq: Every day | CUTANEOUS | Status: DC

## 2015-05-19 NOTE — Progress Notes (Signed)
Pre visit review using our clinic review tool, if applicable. No additional management support is needed unless otherwise documented below in the visit note. 

## 2015-05-19 NOTE — Progress Notes (Signed)
Dr. Frederico Hamman T. Kelly Ranieri, MD, [facility] and Sports Medicine [place], [address] Phone: [phone] Fax: [phone]  05/19/2015  Patient: Glenn Lyons, MRN: [id number], DOB: 26-Feb-1956, 59 y.o.  Primary Physician:  Elsie Stain, MD   Chief Complaint  Patient presents with  . Knee Pain    Bilateral but mostly the right   Subjective:   Glenn Lyons is a 59 y.o. very pleasant male patient who presents with the following:  Body mass index is 51.44 kg/(m^2).   R > L knee is bothering him. Went to [place] for a couple of weeks and knees were really swollen up. Reduced carb intake and still having some pain. Increased carb intake to normal. Maybe is doing a little better. Overall, his knees are doing worse compared to when I saw him 1 month ago. Complaining about them a lot per his wife.   Now it is mostly the right side. Working down there in [place].   R knee inj  04/12/2015 Last OV with Owens Loffler, MD  Very pleasant gentleman with a BMI of 51 presents with ongoing acute on chronic right knee pain with additional left-sided intermittent knee pain and mechanical symptoms.  On the left, he had a presumptive meniscal tear a few years ago, but he has a pacemaker, and this was not confirmed by imaging.  He elected to pursue conservative management without operative intervention.  Right-sided knee is been dull and aching and has been hurting him a lot in the last 10-14 days.  Bothers him when he sits for long periods of time, and when he drives.  Over the weekend, it is gotten quite a bit better when he rested it, now it is about 75% better compared to how was last week.  He does take some anti-inflammatories, ice, icing hot, as well as some Tylenol.  These all seem to make it better somewhat.  Past Medical History, Surgical History, Social History, Family History, Problem List, Medications, and Allergies have been reviewed and  updated if relevant.  Patient Active Problem List   Diagnosis Date Noted  . Skin tag 11/17/2014  . Essential hypertension   . Chills (without fever) 10/05/2014  . Acute respiratory failure with hypoxia ([place]) 10/05/2014  . Aspiration pneumonia ([place]) 10/05/2014  . Hx of colonic polyps   . Fatigue 08/04/2014  . Rash and nonspecific skin eruption 10/21/2013  . Cough 05/26/2013  . Morbid obesity with BMI of 50.0-59.9, adult ([place]) 05/24/2013  . History of colonic polyps 04/29/2013  . Anxiety state, unspecified 03/10/2013  . Pain in joint, shoulder region 02/08/2013  . Sinus bradycardia /pauses   . ED (erectile dysfunction) 02/21/2012  . Cutaneous skin tags 01/31/2012  . Edema 01/31/2012  . Skin lesion 01/08/2012  . Knee pain, left 06/15/2011  . Paresthesia 01/08/2011  . Achalasia 08/17/2010  . DEPRESSION 01/21/2010  . OSTEOARTHRITIS 01/21/2010  . ANGINA, HX OF 01/21/2010  . PACEMAKER, [employer] Judes 01/21/2010  . BACK PAIN 01/05/2010  . GERD 12/07/2009  . OSA (obstructive sleep apnea) 12/07/2009    Past Medical History  Diagnosis Date  . Osteoarthrosis, unspecified whether generalized or localized, unspecified site   . Achalasia     with prev eval at [facility].  No intervention as of 2012  . Cardiac pacemaker St. Jude     ERI 2008  . Depressive disorder, not elsewhere classified   . Esophageal reflux   . Obstructive sleep apnea   .  Osteoarthrosis, unspecified whether generalized or localized, unspecified site   . Stricture and stenosis of esophagus   . Backache, unspecified   . Morbid obesity ([place])   . Sinus bradycardia /pauses   . Presence of permanent cardiac pacemaker     St. Jude- Dr. Caryl Comes follows -device Check 07-06-14  . Aspiration pneumonia ([place]) 10/05/2014    Past Surgical History  Procedure Laterality Date  . Pacemaker insertion    . Colonoscopy w/ polypectomy  11/01/2010  . Esophageal dilation    . Colonoscopy with propofol N/A 10/05/2014    Procedure: COLONOSCOPY WITH  PROPOFOL;  Surgeon: Gatha Mayer, MD;  Location: [facility];  Service: Endoscopy;  Laterality: N/A;  . Esophagogastroduodenoscopy N/A 10/09/2014    Procedure: ESOPHAGOGASTRODUODENOSCOPY (EGD);  Surgeon: Gatha Mayer, MD;  Location: [facility];  Service: Endoscopy;  Laterality: N/A;    Social History   Social History  . Marital Status: Married    Spouse Name: N/A  . Number of Children: 2  . 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     Social History Main Topics  . Smoking status: Former Smoker -- 1.00 packs/day for 20 years    Types: Cigarettes    Quit date: 05/29/2004  . Smokeless tobacco: Never Used  . Alcohol Use: Yes     Comment: rarely  . Drug Use: No  . Sexual Activity: Not on file   Other Topics Concern  . Not on file   Social History Narrative   From [place]   Married    Family History  Problem Relation Age of Onset  . Heart attack Father   . Heart disease Father   . Cancer Father     multiple myeloma  . Hypertension Father   . Hypertension Mother     Allergies  Allergen Reactions  . Morphine     REACTION: Heart stopped.    Medication list reviewed and updated in full in [place].  GEN: No fevers, chills. Nontoxic. Primarily MSK c/o today. MSK: Detailed in the HPI GI: tolerating PO intake without difficulty Neuro: No numbness, parasthesias, or tingling associated. Otherwise the pertinent positives of the ROS are noted above.   Objective:   BP 110/66 mmHg  Pulse 64  Temp(Src) 97.7 F (36.5 C) (Oral)  Ht 5' 11.25" (1.81 m)  Wt 371 lb 8 oz (168.511 kg)  BMI 51.44 kg/m2   GEN: WDWN, NAD, Non-toxic, Alert & Oriented x 3 HEENT: Atraumatic, Normocephalic.  Ears and Nose: No external deformity. EXTR: No clubbing/cyanosis/edema NEURO: Normal gait.  PSYCH: Normally interactive. Conversant. Not depressed or anxious appearing.  Calm demeanor.   Knee:  B Gait: Normal heel toe  pattern ROM: 0 - 120 Effusion: minimal - mild Echymosis or edema: none Patellar tendon NT Painful PLICA: neg Patellar grind: negative Medial and lateral patellar facet loading: negative medial and lateral joint lines: mild tenderness - more on the R medial joint line. Mcmurray's neg Flexion-pinch neg Varus and valgus stress: stable Lachman: neg Ant and Post drawer: neg Hip abduction, IR, ER: WNL Hip flexion str: 5/5 Hip abd: 5/5 Quad: 5/5 VMO atrophy:No Hamstring concentric and eccentric: 5/5   Radiology: No results found.  Assessment and Plan:   Arthritis of both knees - Plan: methylPREDNISolone acetate (DEPO-MEDROL) injection 80 mg  Need for prophylactic vaccination and inoculation against influenza - Plan: Flu Vaccine QUAD 36+ mos IM  Onychomycosis of toenail   R > L knee OA Cont NSAIDS, Tylenol, Topicals, Ice Inject R knee today  Knee Injection, RIGHT Patient verbally consented to procedure. Risks (including potential rare risk of infection), benefits, and alternatives explained. Sterilely prepped with Chloraprep. Ethyl cholride used for anesthesia. 8 cc Lidocaine 1% mixed with 2 mL Depo-Medrol 40 mg injected using the anteromedial approach without difficulty. No complications with procedure and tolerated well. Patient had decreased pain post-injection.   Follow-up: No Follow-up on file.  New Prescriptions   CICLOPIROX (PENLAC) 8 % SOLUTION    Apply topically at bedtime. Apply over nail and skin. Apply daily over previous coat. After 7 days, remove with alcohol and repeat   Modified Medications   No medications on file   Orders Placed This Encounter  Procedures  . Flu Vaccine QUAD 36+ mos IM    Signed,  Demarious Kapur T. Shantavia Jha, MD   Patient's Medications  New Prescriptions   CICLOPIROX (PENLAC) 8 % SOLUTION    Apply topically at bedtime. Apply over nail and skin. Apply daily over previous coat. After 7 days, remove with alcohol and repeat  Previous  Medications   ACETAMINOPHEN (TYLENOL) 325 MG TABLET    Take 2 tablets (650 mg total) by mouth every 6 (six) hours as needed (headache, shoulder pain).   ASPIRIN 81 MG TABLET    Take 81 mg by mouth daily.     CETIRIZINE (ZYRTEC) 10 MG TABLET    Take 20 mg by mouth daily.    CHOLECALCIFEROL (VITAMIN D) 1000 UNITS TABLET    Take 1,000 Units by mouth daily.   CITALOPRAM (CELEXA) 20 MG TABLET    TAKE 1 TABLET (20 MG TOTAL) BY MOUTH DAILY.   FUROSEMIDE (LASIX) 20 MG TABLET    TAKE 1 TO 2 TABLETS EVERY DAY, USE AS LITTLE AS POSSIBLE.   IBUPROFEN (ADVIL,MOTRIN) 200 MG TABLET    Take 200 mg by mouth as needed.     KRILL OIL PO    Take 1 capsule by mouth daily.   MULTIPLE VITAMIN (MULTIVITAMIN WITH MINERALS) TABS TABLET    Take 1 tablet by mouth daily.  Modified Medications   No medications on file  Discontinued Medications   No medications on file

## 2015-05-25 ENCOUNTER — Ambulatory Visit: Payer: Self-pay | Admitting: Pulmonary Disease

## 2015-06-10 ENCOUNTER — Encounter: Payer: BC Managed Care – PPO | Admitting: Internal Medicine

## 2015-06-14 ENCOUNTER — Encounter: Payer: BC Managed Care – PPO | Admitting: Internal Medicine

## 2015-06-15 ENCOUNTER — Ambulatory Visit: Payer: Self-pay | Admitting: Pulmonary Disease

## 2015-07-05 ENCOUNTER — Other Ambulatory Visit: Payer: Self-pay | Admitting: Family Medicine

## 2015-07-05 NOTE — Telephone Encounter (Signed)
Last office visit:   Glenn Lyons 11/16/14.  Last office visit:   Glenn Lyons 05/19/15.  Last Filled:    30 tablet 5 06/09/2014  Please advise.

## 2015-07-06 NOTE — Telephone Encounter (Signed)
Sent. Thanks.   

## 2015-07-13 ENCOUNTER — Encounter: Payer: Self-pay | Admitting: Gastroenterology

## 2015-07-13 ENCOUNTER — Encounter: Payer: BC Managed Care – PPO | Admitting: Internal Medicine

## 2015-07-13 ENCOUNTER — Ambulatory Visit (INDEPENDENT_AMBULATORY_CARE_PROVIDER_SITE_OTHER): Payer: BC Managed Care – PPO | Admitting: Gastroenterology

## 2015-07-13 VITALS — BP 138/78 | HR 64 | Ht 71.25 in | Wt 375.0 lb

## 2015-07-13 DIAGNOSIS — R1310 Dysphagia, unspecified: Secondary | ICD-10-CM | POA: Diagnosis not present

## 2015-07-13 DIAGNOSIS — K224 Dyskinesia of esophagus: Secondary | ICD-10-CM | POA: Diagnosis not present

## 2015-07-13 NOTE — Patient Instructions (Signed)
You have been scheduled for an esophageal manometry at [facility] on 08/23/2015 at 9:30am. Please arrive 30 minutes prior to your procedure for registration. You will need to go to outpatient registration (1st floor of the hospital) first. Make certain to bring your insurance cards as well as a complete list of medications.  Please remember the following:  1) Nothing to eat or drink after 12:00 midnight on the night before your test.  2) Hold all diabetic medications/insulin the morning of the test. You may eat and take             your medications after the test.  3) For 3 days prior to your test do not take: Dexilant, Prevacid, Nexium, Protonix,         Aciphex, Zegerid, Pantoprazole, Prilosec or omeprazole.  4) For 2 days prior to your test, do not take: Reglan, Tagamet, Zantac, Axid or Pepcid.  5) You MAY use an antacid such as Rolaids or Tums up to 12 hours prior to your test.  It will take at least 2 weeks to receive the results of this test from your physician. ------------------------------------------ ABOUT ESOPHAGEAL MANOMETRY Esophageal manometry (muh-NOM-uh-tree) is a test that gauges how well your esophagus works. Your esophagus is the long, muscular tube that connects your throat to your stomach. Esophageal manometry measures the rhythmic muscle contractions (peristalsis) that occur in your esophagus when you swallow. Esophageal manometry also measures the coordination and force exerted by the muscles of your esophagus.  During esophageal manometry, a thin, flexible tube (catheter) that contains sensors is passed through your nose, down your esophagus and into your stomach. Esophageal manometry can be helpful in diagnosing some mostly uncommon disorders that affect your esophagus.  Why it's done Esophageal manometry is used to evaluate the movement (motility) of food through the esophagus and into the stomach. The test measures how well the circular bands of muscle  (sphincters) at the top and bottom of your esophagus open and close, as well as the pressure, strength and pattern of the wave of esophageal muscle contractions that moves food along.  What you can expect Esophageal manometry is an outpatient procedure done without sedation. Most people tolerate it well. You may be asked to change into a hospital gown before the test starts.  During esophageal manometry  While you are sitting up, a member of your health care team sprays your throat with a numbing medication or puts numbing gel in your nose or both.  A catheter is guided through your nose into your esophagus. The catheter may be sheathed in a water-filled sleeve. It doesn't interfere with your breathing. However, your eyes may water, and you may gag. You may have a slight nosebleed from irritation.  After the catheter is in place, you may be asked to lie on your back on an exam table, or you may be asked to remain seated.  You then swallow small sips of water. As you do, a computer connected to the catheter records the pressure, strength and pattern of your esophageal muscle contractions.  During the test, you'll be asked to breathe slowly and smoothly, remain as still as possible, and swallow only when you're asked to do so.  A member of your health care team may move the catheter down into your stomach while the catheter continues its measurements.  The catheter then is slowly withdrawn. The test usually lasts 20 to 30 minutes.  After esophageal manometry  When your esophageal manometry is complete, you  may return to your normal activities  This test typically takes 30-45 minutes to complete. ________________________________________________________________________________

## 2015-07-13 NOTE — Progress Notes (Signed)
Isaih Lyons    [id number]    05/03/1956  Primary Care Physician:Graham Damita Dunnings, MD  Referring Physician: Tonia Ghent, MD [place], [place] [address]  Chief complaint:  Dysphagia  HPI: 60 year old male here with complaints of dysphagia more with liquids than solids. He feels whenever he tries to swallow it takes a long time for the liquid to go down and he can sometimes feel it gurgling or swish in his esophagus. He also thinks he tends to overeat as he does not feel fullness in his stomach because most of his food stay in his esophagus for a long time. He has gained about 30-40 pounds in the past 3-4 years. Barium esophagram showed restricted flow through the EG junction but was not diagnostic of achalasia. He has had EGD with dilation in the past with no significant improvement in the symptoms. Last EGD with Botox in May 2016, he feels his swallowing was better after Botox, and feel it's wearing off in the past few months. Denies any history of food impactions. He also has severe obstructive sleep apnea and wears CPAP at bedtime. He had multiple attempts at placing esophageal manometry probe but was unsuccessful in the past.    Outpatient Encounter Prescriptions as of 07/13/2015  Medication Sig  . acetaminophen (TYLENOL) 325 MG tablet Take 2 tablets (650 mg total) by mouth every 6 (six) hours as needed (headache, shoulder pain).  Marland Kitchen amoxicillin (AMOXIL) 500 MG capsule Take 1 capsule by mouth 4 (four) times daily.  Marland Kitchen aspirin 81 MG tablet Take 81 mg by mouth daily.    . cetirizine (ZYRTEC) 10 MG tablet Take 20 mg by mouth daily.   . cholecalciferol (VITAMIN D) 1000 UNITS tablet Take 1,000 Units by mouth daily.  . ciclopirox (PENLAC) 8 % solution Apply topically at bedtime. Apply over nail and skin. Apply daily over previous coat. After 7 days, remove with alcohol and repeat  . citalopram (CELEXA) 20 MG tablet TAKE 1 TABLET BY MOUTH EVERY DAY  . furosemide  (LASIX) 20 MG tablet TAKE 1 TO 2 TABLETS EVERY DAY, USE AS LITTLE AS POSSIBLE.  Marland Kitchen ibuprofen (ADVIL,MOTRIN) 200 MG tablet Take 200 mg by mouth as needed.    Marland Kitchen KRILL OIL PO Take 1 capsule by mouth daily.  . Multiple Vitamin (MULTIVITAMIN WITH MINERALS) TABS tablet Take 1 tablet by mouth daily.   No facility-administered encounter medications on file as of 07/13/2015.    Allergies as of 07/13/2015 - Review Complete 07/13/2015  Allergen Reaction Noted  . Morphine      Past Medical History  Diagnosis Date  . Osteoarthrosis, unspecified whether generalized or localized, unspecified site   . Achalasia     with prev eval at [facility].  No intervention as of 2012  . Cardiac pacemaker St. Jude     ERI 2008  . Depressive disorder, not elsewhere classified   . Esophageal reflux   . Obstructive sleep apnea   . Osteoarthrosis, unspecified whether generalized or localized, unspecified site   . Stricture and stenosis of esophagus   . Backache, unspecified   . Morbid obesity ([place])   . Sinus bradycardia /pauses   . Presence of permanent cardiac pacemaker     St. Jude- Dr. Caryl Comes follows -device Check 07-06-14  . Aspiration pneumonia ([place]) 10/05/2014    Past Surgical History  Procedure Laterality Date  . Pacemaker insertion    . Colonoscopy w/ polypectomy  11/01/2010  . Esophageal dilation    . Colonoscopy with propofol N/A 10/05/2014    Procedure: COLONOSCOPY WITH PROPOFOL;  Surgeon: Gatha Mayer, MD;  Location: [facility];  Service: Endoscopy;  Laterality: N/A;  . Esophagogastroduodenoscopy N/A 10/09/2014    Procedure: ESOPHAGOGASTRODUODENOSCOPY (EGD);  Surgeon: Gatha Mayer, MD;  Location: [facility];  Service: Endoscopy;  Laterality: N/A;    Family History  Problem Relation Age of Onset  . Heart attack Father   . Heart disease Father   . Cancer Father     multiple myeloma  . Hypertension Father   . Hypertension Mother     Social History   Social History  . Marital Status: Married     Spouse Name: N/A  . Number of Children: 2  . 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     Social History Main Topics  . Smoking status: Former Smoker -- 1.00 packs/day for 20 years    Types: Cigarettes    Quit date: 05/29/2004  . Smokeless tobacco: Never Used  . Alcohol Use: Yes     Comment: rarely  . Drug Use: No  . Sexual Activity: Not on file   Other Topics Concern  . Not on file   Social History Narrative   From [employer]   Married      Review of systems: Review of Systems  Constitutional: Negative for fever and chills.  HENT: Negative.   Eyes: Negative for blurred vision.  Respiratory: Negative for cough, shortness of breath and wheezing.   Cardiovascular: Negative for chest pain and palpitations.  Gastrointestinal: as per HPI Genitourinary: Negative for dysuria, urgency, frequency and hematuria.  Musculoskeletal: Negative for myalgias, back pain and joint pain.  Skin: Negative for itching and rash.  Neurological: Negative for dizziness, tremors, focal weakness, seizures and loss of consciousness.  Endo/Heme/Allergies: Negative for environmental allergies.  Psychiatric/Behavioral: Negative for depression, suicidal ideas and hallucinations.  All other systems reviewed and are negative.   Physical Exam: Filed Vitals:   07/13/15 0832  BP: 138/78  Pulse: 64   Gen:      No acute distress HEENT:  EOMI, sclera anicteric Neck:     No masses; no thyromegaly Lungs:    Clear to auscultation bilaterally; normal respiratory effort CV:         Regular rate and rhythm; no murmurs Abd:      + bowel sounds; soft, non-tender; no palpable masses, no distension Ext:    No edema; adequate peripheral perfusion Skin:      Warm and dry; no rash Neuro: alert and oriented x 3 Psych: normal mood and affect  Data Reviewed:  Barium Esophagogram 07/2010 High-grade obstruction at the distal esophagus with abrupt constriction  at the GE junction. Only trace amount of oral contrast passed through the GE junction during the course of the exam. Differential would include diffuse esophageal spasm, achalasia, and less likely inflammatory or malignant stricturing. The degree of obstruction has increased compared to prior exam  EGD May 2016 1) Stenotic, tight GE junction that opened with gentle pressure - Botox injected 25 U into 4 quadrants each 2) Dilated esophagus, no peristalsis seen 3) normal stomach   Assessment and Plan/Recommendations:  60 year old male with morbid obesity, severe obstructive sleep apnea here for evaluation of dysphagia predominantly to liquids.  Based on barium esophagram is unclear if patient has achalasia/EGJ obstruction or distal  esophageal spasm He did have some improvement with Botox which could happen in the above mentioned conditions as well in addition to achalasia To confirm the diagnosis of achalasia, will schedule for EGD with placement of esophageal manometry probe with direct guidance with the endoscope. Further management based on the findings of the esophageal manometry He will continue to follow up with Dr. Carlean Purl for his recall colonoscopy.  Damaris Hippo , MD [phone] Mon-Fri 8a-5p [phone] after 5p, weekends, holidays

## 2015-07-28 ENCOUNTER — Ambulatory Visit: Payer: Self-pay | Admitting: Family Medicine

## 2015-08-09 ENCOUNTER — Ambulatory Visit: Payer: Self-pay | Admitting: Family Medicine

## 2015-08-16 ENCOUNTER — Telehealth: Payer: Self-pay | Admitting: Internal Medicine

## 2015-08-16 ENCOUNTER — Telehealth: Payer: Self-pay

## 2015-08-16 NOTE — Telephone Encounter (Signed)
Patient is not cleared to have the EGD with propofol on 08/23/15 with esophageal manometry. His cardiologist (Dr Caryl Comes) will not sign off. The patient has not had a pace maker check since August 2016. Last seen in 2015. He has cancelled numerous cardiology office visits. Presently he is scheduled for a cardiology appointment 09/13/15.

## 2015-08-16 NOTE — Progress Notes (Signed)
.    Cardiology Office Note Date:  08/17/2015  Patient ID:  Glenn Lyons, Glenn Lyons 1956/03/11, MRN [id number] PCP:  Elsie Stain, MD  Electrophysiologist: Dr. Caryl Comes GI: Dr. Silverio Decamp   Chief Complaint: Routine in-clinic pacer check  History of Present Illness: Glenn Lyons is a 60 y.o. male with history of achalasia with previous esophageal dilation and botox injection, GERD, OA,  OSA reports compliance,  bradycardia/pause that records state secondary to a medication/morphine injection that resulted in PPM, and morbid obesity comes to he office today seen for Dr. Caryl Comes for a pacer check.    The last visit here was 12/12/13, explains that he travels with his job often and unfortunately had missed his last appointment, his last remote pacer check 01/14/15  He is feeling very well, denies any kind of CP or SOB, no palpitations, dizziness, near syncope or syncope.  He has some minimal edema today, he reports this as chronic and unchanged for years, accumulates as the days goes and worse when on his feet longer periods or with travel, he uses his lasix PRN, only occasionally does he feel he needs it.  His exertional cpacity is at his baseline.  He reports he sleeps well with his CPAP and uses it every night without fail.  He is planned for EGD next week.  He reports seeing his PMD routinely getting annual labs and keeping up with this.   Past Medical History  Diagnosis Date  . Osteoarthrosis, unspecified whether generalized or localized, unspecified site   . Achalasia     with prev eval at [facility].  No intervention as of 2012  . Cardiac pacemaker St. Jude     ERI 2008  . Depressive disorder, not elsewhere classified   . Esophageal reflux   . Obstructive sleep apnea   . Osteoarthrosis, unspecified whether generalized or localized, unspecified site   . Stricture and stenosis of esophagus   . Backache, unspecified   . Morbid obesity ([place])   . Sinus bradycardia /pauses   . Presence of permanent  cardiac pacemaker     St. Jude- Dr. Caryl Comes follows -device Check 07-06-14  . Aspiration pneumonia ([place]) 10/05/2014    Past Surgical History  Procedure Laterality Date  . Pacemaker insertion    . Colonoscopy w/ polypectomy  11/01/2010  . Esophageal dilation    . Colonoscopy with propofol N/A 10/05/2014    Procedure: COLONOSCOPY WITH PROPOFOL;  Surgeon: Gatha Mayer, MD;  Location: [facility];  Service: Endoscopy;  Laterality: N/A;  . Esophagogastroduodenoscopy N/A 10/09/2014    Procedure: ESOPHAGOGASTRODUODENOSCOPY (EGD);  Surgeon: Gatha Mayer, MD;  Location: [facility];  Service: Endoscopy;  Laterality: N/A;    Current Outpatient Prescriptions  Medication Sig Dispense Refill  . acetaminophen (TYLENOL) 325 MG tablet Take 2 tablets (650 mg total) by mouth every 6 (six) hours as needed (headache, shoulder pain). 30 tablet 0  . aspirin 81 MG tablet Take 81 mg by mouth daily.      . ciclopirox (PENLAC) 8 % solution Apply topically at bedtime. Apply over nail and skin. Apply daily over previous coat. After 7 days, remove with alcohol and repeat 6.6 mL 5  . citalopram (CELEXA) 20 MG tablet TAKE 1 TABLET BY MOUTH EVERY DAY 30 tablet 5  . furosemide (LASIX) 20 MG tablet TAKE 1 TO 2 TABLETS EVERY DAY, USE AS LITTLE AS POSSIBLE. 30 tablet 5  . KRILL OIL PO Take 1 capsule by mouth daily.    . Multiple Vitamin (MULTIVITAMIN  WITH MINERALS) TABS tablet Take 1 tablet by mouth daily.     No current facility-administered medications for this visit.    Allergies:   Morphine   Social History:  The patient  reports that he quit smoking about 11 years ago. His smoking use included Cigarettes. He has a 20 pack-year smoking history. He has never used smokeless tobacco. He reports that he drinks alcohol. He reports that he does not use illicit drugs.   Family History:  The patient's family history includes Cancer in his father; Heart attack in his father; Heart disease in his father; Hypertension in his  father and mother.  ROS:  Please see the history of present illness.  All other systems are reviewed and otherwise negative.   PHYSICAL EXAM:  VS:  BP 146/78 mmHg  Pulse 63  Ht 5' 11.25" (1.81 m)  Wt 375 lb (170.099 kg)  BMI 51.92 kg/m2 BMI: Body mass index is 51.92 kg/(m^2). Very pleasent, morbidly obese WM, in no acute distress HEENT: normocephalic, atraumatic Neck: no JVD, carotid bruits or masses Cardiac:  normal S1, S2; RRR; no significant murmurs, no rubs, or gallops Lungs:  clear to auscultation bilaterally, no wheezing, rhonchi or rales Abd: soft, nontender MS: no deformity or atrophy Ext: trace edema the patient reports as chronic, wearing support stockings Skin: warm and dry, no rash Neuro:  No gross deficits appreciated Psych: euthymic mood, full affect  PPM site (right chest) is stable, no tethering or discomfort   EKG:  Done today shows SR, LAD PPM check today: normal function, A paced 46%, V paced <1%, battery status is good, battery life 3-4.5 years  Recent Labs: 10/06/2014: ALT 21; BUN 12; Creatinine, Ser 0.98; Hemoglobin 12.3*; Platelets 207; Potassium 4.1; Sodium 135  No results found for requested labs within last 365 days.   CrCl cannot be calculated (Patient has no serum creatinine result on file.).   Wt Readings from Last 3 Encounters:  08/17/15 375 lb (170.099 kg)  07/13/15 375 lb (170.099 kg)  05/19/15 371 lb 8 oz (168.511 kg)     Other studies reviewed: Additional studies/records reviewed today include: summarized above   DEVICE information: STJ PPM, 05/06/05, Dr. Caryl Comes   ASSESSMENT AND PLAN:  1. Bradycardia, PPM     Normal device function, no changes made     counseled on importance of device checks and f/u visits  2. Dependent LE edema, morbid obesity     Reported as chronic and unchanged for many years     He uses his lasix PRN      Counseled on weight loss, exercise   Disposition: Reviewed with Dr. Curt Bears today, pacer function  is intact, no objection to planned EGD from our standpoint, device clinic pacer check in 6 months, Dr. Caryl Comes in 1 year, sooner if needed.  Current medicines are reviewed at length with the patient today.  The patient did not have any concerns regarding medicines.  Haywood Lasso, PA-C 08/17/2015 8:48 AM     [place] [address] [phone] (office)  [phone] (fax)

## 2015-08-16 NOTE — Telephone Encounter (Signed)
New Message  Pt has phys pacer check sched for April- pt stated he has endoscopy sched for this monda 3/27- Pt wanted to know if he can come in to see Renee/Amber for this clearance instaed of waiting for Dr Caryl Comes- pt stated Dr Silverio Decamp is sending information- Pt requested to speak w/ RN. Please call back and discuss.

## 2015-08-16 NOTE — Progress Notes (Signed)
Left message with beth dr Wendie Simmer nurse, patient needs to see dr Caryl Comes for packemaker check prior to 07-29-25-17 procedure and for her office to please let patient know this.

## 2015-08-17 ENCOUNTER — Encounter: Payer: Self-pay | Admitting: Physician Assistant

## 2015-08-17 ENCOUNTER — Ambulatory Visit (INDEPENDENT_AMBULATORY_CARE_PROVIDER_SITE_OTHER): Payer: BC Managed Care – PPO | Admitting: Physician Assistant

## 2015-08-17 VITALS — BP 146/78 | HR 63 | Ht 71.25 in | Wt 375.0 lb

## 2015-08-17 DIAGNOSIS — I10 Essential (primary) hypertension: Secondary | ICD-10-CM

## 2015-08-17 DIAGNOSIS — Z01818 Encounter for other preprocedural examination: Secondary | ICD-10-CM | POA: Diagnosis not present

## 2015-08-17 DIAGNOSIS — R001 Bradycardia, unspecified: Secondary | ICD-10-CM | POA: Diagnosis not present

## 2015-08-17 NOTE — Telephone Encounter (Signed)
Cardiology states specifically in today's office note the patient is cleared for his EGD. I have notified [facility] Ivin Booty). Procedure is still on the schedule as planned.

## 2015-08-17 NOTE — Patient Instructions (Signed)
Medication Instructions:   Your physician recommends that you continue on your current medications as directed. Please refer to the Current Medication list given to you today.   If you need a refill on your cardiac medications before your next appointment, please call your pharmacy.  Labwork: NONE ORDER TODAY    Testing/Procedures:  NONE ORDER TODAY    Follow-Up:  Your physician wants you to follow-up in: [place] will receive a reminder letter in the mail two months in advance. If you don't receive a letter, please call our office to schedule the follow-up appointment.     6 MONTHS WITH DEVICES CLINIC PACER CHECK   Any Other Special Instructions Will Be Listed Below (If Applicable).

## 2015-08-17 NOTE — Telephone Encounter (Signed)
The patient has contacted me. He has reached out to his cardiologist and will go to that office today.

## 2015-08-17 NOTE — Telephone Encounter (Signed)
?   If he is seeing cardiology today Dr. Silverio Decamp is doing this procedure so will have to see what she thinks about this.Marland Kitchenibuprofen.e. Whether to proceed.

## 2015-08-19 ENCOUNTER — Encounter (HOSPITAL_COMMUNITY): Payer: Self-pay | Admitting: *Deleted

## 2015-08-19 NOTE — Anesthesia Preprocedure Evaluation (Addendum)
Anesthesia Evaluation  Patient identified by MRN, date of birth, ID band Patient awake    Reviewed: Allergy & Precautions, NPO status , Patient's Chart, lab work & pertinent test results  Airway Mallampati: II   Neck ROM: Full    Dental  (+) Teeth Intact, Dental Advisory Given   Pulmonary sleep apnea and Continuous Positive Airway Pressure Ventilation , former smoker,    breath sounds clear to auscultation       Cardiovascular hypertension, Pt. on medications + pacemaker (ST Jude, Brady syndrome  )  Rhythm:Regular  /cardiology saw and cleared on 08/16/2015   Neuro/Psych Anxiety Depression    GI/Hepatic Neg liver ROS, GERD  ,Possible stricture, possible aclasia     Endo/Other  Morbid obesity  Renal/GU negative Renal ROS     Musculoskeletal   Abdominal (+) + obese,   Peds  Hematology   Anesthesia Other Findings   Reproductive/Obstetrics                          Anesthesia Physical Anesthesia Plan  ASA: III  Anesthesia Plan: General   Post-op Pain Management:    Induction: Intravenous and Rapid sequence  Airway Management Planned: Oral ETT  Additional Equipment:   Intra-op Plan:   Post-operative Plan:   Informed Consent: I have reviewed the patients History and Physical, chart, labs and discussed the procedure including the risks, benefits and alternatives for the proposed anesthesia with the patient or authorized representative who has indicated his/her understanding and acceptance.     Plan Discussed with:   Anesthesia Plan Comments:        Anesthesia Quick Evaluation

## 2015-08-19 NOTE — Telephone Encounter (Signed)
Dr. Caryl Comes- I haven't received any information on this patient having an endoscopy. Do you recommend that he see someone prior to this being done on Monday?

## 2015-08-19 NOTE — Progress Notes (Signed)
Spoke with dr Gifford Shave and made aware abnormal chest xray 10-08-14 results, patient does not needs follow up chest xray as long as respiratory satuurations are good aday of procedure 08-23-15

## 2015-08-23 ENCOUNTER — Ambulatory Visit (HOSPITAL_COMMUNITY)
Admission: RE | Admit: 2015-08-23 | Discharge: 2015-08-23 | Disposition: A | Discharge status: Home / Self Care | Payer: BC Managed Care – PPO | Source: Ambulatory Visit | Attending: Gastroenterology | Admitting: Gastroenterology

## 2015-08-23 ENCOUNTER — Encounter (HOSPITAL_COMMUNITY)
Admission: RE | Disposition: A | Discharge status: Home / Self Care | Payer: Self-pay | Source: Ambulatory Visit | Attending: Gastroenterology

## 2015-08-23 ENCOUNTER — Ambulatory Visit (HOSPITAL_COMMUNITY): Payer: BC Managed Care – PPO | Admitting: Anesthesiology

## 2015-08-23 ENCOUNTER — Other Ambulatory Visit: Payer: Self-pay

## 2015-08-23 ENCOUNTER — Encounter (HOSPITAL_COMMUNITY): Payer: Self-pay | Admitting: Registered Nurse

## 2015-08-23 ENCOUNTER — Ambulatory Visit (HOSPITAL_COMMUNITY)
Admission: RE | Admit: 2015-08-23 | Payer: BC Managed Care – PPO | Source: Ambulatory Visit | Admitting: Gastroenterology

## 2015-08-23 DIAGNOSIS — Z6841 Body Mass Index (BMI) 40.0 and over, adult: Secondary | ICD-10-CM | POA: Insufficient documentation

## 2015-08-23 DIAGNOSIS — Z950 Presence of cardiac pacemaker: Secondary | ICD-10-CM | POA: Insufficient documentation

## 2015-08-23 DIAGNOSIS — Z79899 Other long term (current) drug therapy: Secondary | ICD-10-CM | POA: Diagnosis not present

## 2015-08-23 DIAGNOSIS — K228 Other specified diseases of esophagus: Secondary | ICD-10-CM

## 2015-08-23 DIAGNOSIS — G4733 Obstructive sleep apnea (adult) (pediatric): Secondary | ICD-10-CM | POA: Insufficient documentation

## 2015-08-23 DIAGNOSIS — K220 Achalasia of cardia: Secondary | ICD-10-CM

## 2015-08-23 DIAGNOSIS — Z87891 Personal history of nicotine dependence: Secondary | ICD-10-CM | POA: Diagnosis not present

## 2015-08-23 DIAGNOSIS — Z7982 Long term (current) use of aspirin: Secondary | ICD-10-CM | POA: Diagnosis not present

## 2015-08-23 DIAGNOSIS — R1310 Dysphagia, unspecified: Secondary | ICD-10-CM

## 2015-08-23 HISTORY — PX: ESOPHAGOGASTRODUODENOSCOPY (EGD) WITH PROPOFOL: SHX5813

## 2015-08-23 HISTORY — PX: ESOPHAGEAL MANOMETRY: SHX5429

## 2015-08-23 SURGERY — MANOMETRY, ESOPHAGUS

## 2015-08-23 SURGERY — ESOPHAGOGASTRODUODENOSCOPY (EGD) WITH PROPOFOL
Anesthesia: General

## 2015-08-23 MED ORDER — SODIUM CHLORIDE 0.9 % IJ SOLN
INTRAMUSCULAR | Status: AC
  Filled 2015-08-23: qty 40

## 2015-08-23 MED ORDER — SODIUM CHLORIDE 0.9 % IV SOLN: INTRAVENOUS | Status: DC

## 2015-08-23 MED ORDER — ONDANSETRON HCL 4 MG/2ML IJ SOLN
INTRAMUSCULAR | Status: AC
  Filled 2015-08-23: qty 2

## 2015-08-23 MED ORDER — LIDOCAINE HCL (CARDIAC) 20 MG/ML IV SOLN
INTRAVENOUS | Status: AC
  Filled 2015-08-23: qty 5

## 2015-08-23 MED ORDER — ONDANSETRON HCL 4 MG/2ML IJ SOLN
INTRAMUSCULAR | Status: DC | PRN
  Administered 2015-08-23: 4 mg via INTRAVENOUS

## 2015-08-23 MED ORDER — REMIFENTANIL HCL 1 MG IV SOLR
INTRAVENOUS | Status: DC | PRN
  Administered 2015-08-23: .5 ug/kg/min via INTRAVENOUS

## 2015-08-23 MED ORDER — LACTATED RINGERS IV SOLN
INTRAVENOUS | Status: DC | PRN
  Administered 2015-08-23: 10:00:00 via INTRAVENOUS

## 2015-08-23 MED ORDER — GLYCOPYRROLATE 0.2 MG/ML IJ SOLN
INTRAMUSCULAR | Status: DC | PRN
  Administered 2015-08-23: .2 mg via INTRAVENOUS

## 2015-08-23 MED ORDER — GLYCOPYRROLATE 0.2 MG/ML IJ SOLN
INTRAMUSCULAR | Status: AC
  Filled 2015-08-23: qty 1

## 2015-08-23 MED ORDER — PROPOFOL 10 MG/ML IV BOLUS
INTRAVENOUS | Status: AC
Start: 2015-08-23 — End: 2015-08-23
  Filled 2015-08-23: qty 60

## 2015-08-23 MED ORDER — LIDOCAINE HCL (CARDIAC) 20 MG/ML IV SOLN
INTRAVENOUS | Status: DC | PRN
  Administered 2015-08-23: 75 mg via INTRAVENOUS

## 2015-08-23 MED ORDER — PROPOFOL 10 MG/ML IV BOLUS
INTRAVENOUS | Status: DC | PRN
  Administered 2015-08-23: 300 mg via INTRAVENOUS

## 2015-08-23 MED ORDER — SUCCINYLCHOLINE CHLORIDE 20 MG/ML IJ SOLN
INTRAMUSCULAR | Status: DC | PRN
  Administered 2015-08-23: 140 mg via INTRAVENOUS

## 2015-08-23 MED ORDER — REMIFENTANIL HCL 1 MG IV SOLR
INTRAVENOUS | Status: AC
  Filled 2015-08-23: qty 1000

## 2015-08-23 SURGICAL SUPPLY — 14 items

## 2015-08-23 SURGICAL SUPPLY — 2 items
FACESHIELD LNG OPTICON STERILE (SAFETY) IMPLANT
GLOVE BIO SURGEON STRL SZ8 (GLOVE) ×4 IMPLANT

## 2015-08-23 NOTE — Telephone Encounter (Signed)
Patient had endoscopy today.

## 2015-08-23 NOTE — Op Note (Signed)
[facility] Patient Name: Glenn Lyons Procedure Date: 08/23/2015 MRN: [id number] Attending MD: Mauri Pole , MD Date of Birth: 28-Dec-1955 CSN:  Age: 60 Admit Type: Outpatient Procedure:                Upper GI endoscopy Indications:              Dysphagia Providers:                Mauri Pole, MD, Cleda Daub, RN, Despina Pole, Technician Referring MD:              Medicines:                Monitored Anesthesia Care, General Anesthesia Complications:            No immediate complications. Estimated Blood Loss:     Estimated blood loss: none. Procedure:                Pre-Anesthesia Assessment:                           - Prior to the procedure, a History and Physical                            was performed, and patient medications and                            allergies were reviewed. The patient's tolerance of                            previous anesthesia was also reviewed. The risks                            and benefits of the procedure and the sedation                            options and risks were discussed with the patient.                            All questions were answered, and informed consent                            was obtained. Prior Anticoagulants: The patient has                            taken no previous anticoagulant or antiplatelet                            agents. ASA Grade Assessment: III - A patient with                            severe systemic disease. After reviewing the risks  and benefits, the patient was deemed in                            satisfactory condition to undergo the procedure.                           After obtaining informed consent, the endoscope was                            passed under direct vision. Throughout the                            procedure, the patient's blood pressure, pulse, and                            oxygen  saturations were monitored continuously. The                            [id number] scope was introduced through the                            mouth, and advanced to the second part of duodenum.                            The upper GI endoscopy was accomplished without                            difficulty. The patient tolerated the procedure                            well. Findings:      The stomach was normal.      The examined duodenum was normal.      The lumen of the middle third of the esophagus and lower third of the       esophagus was moderately tortous and dilated with tightness at EG       junction. There was retained secretions in the dilated mid to distal       esophagus A Esophageal manometry probe was placed with endoscopic       guidance through GE junction into the stomach      The exam was otherwise without abnormality. Impression:               - Normal stomach.                           - Normal examined duodenum.                           - Dilation in the middle third of the esophagus and                            in the lower third of the esophagus.                           - The examination was otherwise normal.                           -  An esophageal manometry probe was placed.                           - No specimens collected. Moderate Sedation:      N/A- Per Anesthesia Care Recommendation:           - Proceed with esophageal manometry study once                            awake to do swallows                           - Patient has a contact number available for                            emergencies. The signs and symptoms of potential                            delayed complications were discussed with the                            patient. Return to normal activities tomorrow.                            Written discharge instructions were provided to the                            patient.                           - Resume previous diet.                            - Continue present medications.                           - Return to GI clinic (date not yet determined) and                            further management based on findings of esophageal                            manometry Procedure Code(s):        --- Professional ---                           [phone], Esophagogastroduodenoscopy, flexible,                            transoral; diagnostic, including collection of                            specimen(s) by brushing or washing, when performed                            (separate procedure) Diagnosis Code(s):        --- Professional ---  K22.8, Other specified diseases of esophagus                           R13.10, Dysphagia, unspecified CPT copyright 2016 American Medical Association. All rights reserved. The codes documented in this report are preliminary and upon coder review may  be revised to meet current compliance requirements. Mauri Pole, MD 08/23/2015 11:29:40 AM This report has been signed electronically. Number of Addenda: 0

## 2015-08-23 NOTE — Anesthesia Procedure Notes (Signed)
Procedure Name: Intubation Date/Time: 08/23/2015 10:56 AM Performed by: Lissa Morales Pre-anesthesia Checklist: Patient identified, Emergency Drugs available, Suction available and Patient being monitored Patient Re-evaluated:Patient Re-evaluated prior to inductionOxygen Delivery Method: Circle System Utilized Preoxygenation: Pre-oxygenation with 100% oxygen Intubation Type: IV induction Ventilation: Mask ventilation without difficulty Laryngoscope Size: Mac and 4 Grade View: Grade II Tube type: Oral Tube size: 8.0 mm Number of attempts: 1 Airway Equipment and Method: Stylet and Oral airway Placement Confirmation: ETT inserted through vocal cords under direct vision,  positive ETCO2 and breath sounds checked- equal and bilateral Secured at: 22 cm Tube secured with: Tape Dental Injury: Teeth and Oropharynx as per pre-operative assessment

## 2015-08-23 NOTE — H&P (Signed)
[facility] History and Physical   Primary Care Physician:  Elsie Stain, MD   Reason for Procedure:  Dysphagia Plan:    EGD with placement of Esophageal manometry catheter    HPI: Glenn Lyons is a 60 y.o. male with h/o dysphagia, past failed attempts at placement of esophageal manometry here with EGD guided placement of manometry catheter. Denies any nausea, vomiting, abdominal pain, melena or bright red blood per rectum    Past Medical History  Diagnosis Date  . Osteoarthrosis, unspecified whether generalized or localized, unspecified site   . Achalasia     with prev eval at [facility].  No intervention as of 2012  . Cardiac pacemaker St. Jude     ERI 2008  . Depressive disorder, not elsewhere classified   . Esophageal reflux   . Osteoarthrosis, unspecified whether generalized or localized, unspecified site   . Stricture and stenosis of esophagus   . Backache, unspecified   . Morbid obesity ([place])   . Sinus bradycardia /pauses   . Presence of permanent cardiac pacemaker     St. Jude- Dr. Caryl Comes follows -device Check 07-06-14  . Obstructive sleep apnea     biapap settign 25-22  . Aspiration pneumonia ([place]) 10/05/2014    Past Surgical History  Procedure Laterality Date  . Pacemaker insertion    . Colonoscopy w/ polypectomy  11/01/2010  . Esophageal dilation    . Colonoscopy with propofol N/A 10/05/2014    Procedure: COLONOSCOPY WITH PROPOFOL;  Surgeon: Gatha Mayer, MD;  Location: [facility];  Service: Endoscopy;  Laterality: N/A;  . Esophagogastroduodenoscopy N/A 10/09/2014    Procedure: ESOPHAGOGASTRODUODENOSCOPY (EGD);  Surgeon: Gatha Mayer, MD;  Location: [facility];  Service: Endoscopy;  Laterality: N/A;    Prior to Admission medications   Medication Sig Start Date End Date Taking? Authorizing Provider  acetaminophen (TYLENOL) 325 MG tablet Take 2 tablets (650 mg total) by mouth every 6 (six) hours as needed (headache, shoulder pain). 10/10/14  Yes Robbie Lis, MD  aspirin 81 MG tablet Take 81 mg by mouth daily.     Yes Historical Provider, MD  Cetirizine HCl 10 MG CAPS Take 10 mg by mouth daily.   Yes Historical Provider, MD  furosemide (LASIX) 20 MG tablet TAKE 1 TO 2 TABLETS EVERY DAY, USE AS LITTLE AS POSSIBLE. 10/05/14  Yes Tonia Ghent, MD  KRILL OIL PO Take 1 capsule by mouth daily.   Yes Historical Provider, MD  Multiple Vitamin (MULTIVITAMIN WITH MINERALS) TABS tablet Take 1 tablet by mouth daily.   Yes Historical Provider, MD  ciclopirox (PENLAC) 8 % solution Apply topically at bedtime. Apply over nail and skin. Apply daily over previous coat. After 7 days, remove with alcohol and repeat 05/19/15   Owens Loffler, MD    Current Facility-Administered Medications  Medication Dose Route Frequency Provider Last Rate Last Dose  . 0.9 %  sodium chloride infusion   Intravenous Continuous Mauri Pole, MD       Facility-Administered Medications Ordered in Other Encounters  Medication Dose Route Frequency Provider Last Rate Last Dose  . lactated ringers infusion    Continuous PRN Lissa Morales, CRNA        Allergies as of 07/13/2015 - Review Complete 07/13/2015  Allergen Reaction Noted  . Morphine      Family History  Problem Relation Age of Onset  . Heart attack Father   . Heart disease Father   . Cancer Father  multiple myeloma  . Hypertension Father   . Hypertension Mother     Social History   Social History  . Marital Status: Married    Spouse Name: N/A  . Number of Children: 2  . 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     Social History Main Topics  . Smoking status: Former Smoker -- 1.00 packs/day for 20 years    Types: Cigarettes    Quit date: 05/29/2004  . Smokeless tobacco: Never Used  . Alcohol Use: Yes     Comment: rarely  . Drug Use: No  . Sexual Activity: Not on file   Other Topics Concern  . Not on file   Social History Narrative   From  [employer]   Married    Review of Systems:  All other review of systems negative except as mentioned in the HPI.  Physical Exam: Vital signs in last 24 hours: Temp:  [98.2 F (36.8 C)] 98.2 F (36.8 C) (03/27 1000) Pulse Rate:  [60] 60 (03/27 1000) Resp:  [15] 15 (03/27 1000) BP: (155)/(78) 155/78 mmHg (03/27 1000) SpO2:  [98 %] 98 % (03/27 1000) Weight:  [375 lb (170.099 kg)] 375 lb (170.099 kg) (03/27 1000)   General:   Alert,  Well-developed, well-nourished, pleasant and cooperative in NAD Lungs:  Clear throughout to auscultation.   Heart:  Regular rate and rhythm; no murmurs, clicks, rubs,  or gallops. Abdomen:  Soft, nontender and nondistended. Normal bowel sounds.   Neuro/Psych:  Alert and cooperative. Normal mood and affect. A and O x 3   _0 .[name], MD [facility] [phone] (pager) 08/23/2015 10:32 AM@

## 2015-08-23 NOTE — Telephone Encounter (Signed)
ok 

## 2015-08-23 NOTE — Anesthesia Postprocedure Evaluation (Signed)
Anesthesia Post Note  Patient: Glenn Lyons  Procedure(s) Performed: Procedure(s) (LRB): ESOPHAGOGASTRODUODENOSCOPY (EGD) WITH PROPOFOL (N/A)  Patient location during evaluation: Endoscopy Anesthesia Type: General Level of consciousness: awake and alert Pain management: pain level controlled Vital Signs Assessment: post-procedure vital signs reviewed and stable Respiratory status: spontaneous breathing, nonlabored ventilation, respiratory function stable and patient connected to nasal cannula oxygen Cardiovascular status: blood pressure returned to baseline and stable Postop Assessment: no signs of nausea or vomiting Anesthetic complications: no    Last Vitals:  Filed Vitals:   08/23/15 1000  BP: 155/78  Pulse: 60  Temp: 36.8 C  Resp: 15    Last Pain:  Filed Vitals:   08/23/15 1001  PainSc: [place]

## 2015-08-23 NOTE — Transfer of Care (Signed)
Immediate Anesthesia Transfer of Care Note  Patient: Glenn Lyons  Procedure(s) Performed: Procedure(s): ESOPHAGOGASTRODUODENOSCOPY (EGD) WITH PROPOFOL (N/A)  Patient Location: PACU  Anesthesia Type:General  Level of Consciousness: awake, alert , oriented and patient cooperative  Airway & Oxygen Therapy: Patient Spontanous Breathing and Patient connected to face mask oxygen  Post-op Assessment: Report given to RN, Post -op Vital signs reviewed and stable and Patient moving all extremities X 4  Post vital signs: stable  Last Vitals:  Filed Vitals:   08/23/15 1000  BP: 155/78  Pulse: 60  Temp: 36.8 C  Resp: 15    Complications: No apparent anesthesia complications

## 2015-08-23 NOTE — Discharge Instructions (Signed)

## 2015-08-25 ENCOUNTER — Encounter (HOSPITAL_COMMUNITY): Payer: Self-pay | Admitting: Gastroenterology

## 2015-08-25 ENCOUNTER — Other Ambulatory Visit: Payer: Self-pay

## 2015-08-25 DIAGNOSIS — J9859 Other diseases of mediastinum, not elsewhere classified: Secondary | ICD-10-CM

## 2015-08-25 DIAGNOSIS — R1310 Dysphagia, unspecified: Secondary | ICD-10-CM

## 2015-08-31 ENCOUNTER — Other Ambulatory Visit: Payer: Self-pay

## 2015-09-01 ENCOUNTER — Other Ambulatory Visit: Payer: Self-pay

## 2015-09-01 ENCOUNTER — Ambulatory Visit (INDEPENDENT_AMBULATORY_CARE_PROVIDER_SITE_OTHER)
Admission: RE | Admit: 2015-09-01 | Discharge: 2015-09-01 | Disposition: A | Discharge status: Home / Self Care | Payer: BC Managed Care – PPO | Source: Ambulatory Visit | Attending: Gastroenterology | Admitting: Gastroenterology

## 2015-09-01 ENCOUNTER — Inpatient Hospital Stay: Admission: RE | Admit: 2015-09-01 | Payer: Self-pay | Source: Ambulatory Visit

## 2015-09-01 DIAGNOSIS — J9859 Other diseases of mediastinum, not elsewhere classified: Secondary | ICD-10-CM | POA: Diagnosis not present

## 2015-09-01 DIAGNOSIS — Z8719 Personal history of other diseases of the digestive system: Secondary | ICD-10-CM

## 2015-09-01 DIAGNOSIS — R1310 Dysphagia, unspecified: Secondary | ICD-10-CM

## 2015-09-01 DIAGNOSIS — K222 Esophageal obstruction: Secondary | ICD-10-CM

## 2015-09-01 DIAGNOSIS — K2289 Other specified disease of esophagus: Secondary | ICD-10-CM

## 2015-09-01 DIAGNOSIS — K228 Other specified diseases of esophagus: Secondary | ICD-10-CM

## 2015-09-01 DIAGNOSIS — Z9889 Other specified postprocedural states: Secondary | ICD-10-CM

## 2015-09-01 MED ORDER — IOPAMIDOL (ISOVUE-300) INJECTION 61%
80.0000 mL | Freq: Once | INTRAVENOUS | Status: AC | PRN
  Administered 2015-09-01: 80 mL via INTRAVENOUS

## 2015-09-13 ENCOUNTER — Ambulatory Visit (INDEPENDENT_AMBULATORY_CARE_PROVIDER_SITE_OTHER): Payer: BC Managed Care – PPO | Admitting: Internal Medicine

## 2015-09-13 DIAGNOSIS — I495 Sick sinus syndrome: Secondary | ICD-10-CM

## 2015-09-13 DIAGNOSIS — Z950 Presence of cardiac pacemaker: Secondary | ICD-10-CM

## 2015-09-13 NOTE — Progress Notes (Signed)
Patient Care Team: Tonia Ghent, MD as PCP - General   HPI  Glenn Lyons is a 60 y.o. male  Seen in pacemaker followup. This was implanted about 2010  o following presentation to his local hospital with chest pain. He was given morphine and in the context of significant sleep apnea he was noted to have a pause. He underwent pacing. He also has treated sleep apnea which has been markedly helpful.      Past Medical History  Diagnosis Date  . Osteoarthrosis, unspecified whether generalized or localized, unspecified site   . Achalasia     with prev eval at [facility].  No intervention as of 2012  . Cardiac pacemaker St. Jude     ERI 2008  . Depressive disorder, not elsewhere classified   . Esophageal reflux   . Osteoarthrosis, unspecified whether generalized or localized, unspecified site   . Stricture and stenosis of esophagus   . Backache, unspecified   . Morbid obesity ([place])   . Sinus bradycardia /pauses   . Presence of permanent cardiac pacemaker     St. Jude- Dr. Caryl Comes follows -device Check 07-06-14  . Obstructive sleep apnea     biapap settign 25-22  . Aspiration pneumonia ([place]) 10/05/2014    Past Surgical History  Procedure Laterality Date  . Pacemaker insertion    . Colonoscopy w/ polypectomy  11/01/2010  . Esophageal dilation    . Colonoscopy with propofol N/A 10/05/2014    Procedure: COLONOSCOPY WITH PROPOFOL;  Surgeon: Gatha Mayer, MD;  Location: [facility];  Service: Endoscopy;  Laterality: N/A;  . Esophagogastroduodenoscopy N/A 10/09/2014    Procedure: ESOPHAGOGASTRODUODENOSCOPY (EGD);  Surgeon: Gatha Mayer, MD;  Location: [facility];  Service: Endoscopy;  Laterality: N/A;  . Esophageal manometry N/A 08/23/2015    Procedure: ESOPHAGEAL MANOMETRY (EM);  Surgeon: Mauri Pole, MD;  Location: [facility];  Service: Endoscopy;  Laterality: N/A;  . Esophagogastroduodenoscopy (egd) with propofol N/A 08/23/2015    Procedure: ESOPHAGOGASTRODUODENOSCOPY (EGD)  WITH PROPOFOL;  Surgeon: Mauri Pole, MD;  Location: [facility];  Service: Endoscopy;  Laterality: N/A;    Current Outpatient Prescriptions  Medication Sig Dispense Refill  . acetaminophen (TYLENOL) 325 MG tablet Take 2 tablets (650 mg total) by mouth every 6 (six) hours as needed (headache, shoulder pain). 30 tablet 0  . aspirin 81 MG tablet Take 81 mg by mouth daily.      . Cetirizine HCl 10 MG CAPS Take 10 mg by mouth daily.    . furosemide (LASIX) 20 MG tablet TAKE 1 TO 2 TABLETS EVERY DAY, USE AS LITTLE AS POSSIBLE. 30 tablet 5  . KRILL OIL PO Take 1 capsule by mouth daily.    . Multiple Vitamin (MULTIVITAMIN WITH MINERALS) TABS tablet Take 1 tablet by mouth daily.     No current facility-administered medications for this visit.    Allergies  Allergen Reactions  . Morphine Other (See Comments)    REACTION: Heart stopped.    Review of Systems negative except from HPI and PMH  Physical Exam There were no vitals taken for this visit. Well developed and well nourished in no acute distress HENT normal E scleral and icterus clear Neck Supple JVP flat; carotids brisk and full Clear to ausculation Device pocket well healed; without hematoma or erythema.  There is no tethering   Regular rate and rhythm, no murmurs gallops or rub Soft with active bowel sounds No clubbing cyanosis  1+ Edema  Alert and oriented, grossly normal motor and sensory function Skin Warm and Dry  ECG demonstrates atrial pacing at 60 Intervals 19/11/42  Assessment and  Plan  Sinus node dysfunction  Peripheral edema  Pacemaker-St. Jude  The patient's device was interrogated.  The information was reviewed. The device was reprogrammed to inactivate rate response Sleep apnea

## 2015-09-14 ENCOUNTER — Encounter: Payer: Self-pay | Admitting: Internal Medicine

## 2015-09-22 ENCOUNTER — Ambulatory Visit: Payer: Self-pay | Admitting: Pulmonary Disease

## 2015-09-23 ENCOUNTER — Encounter: Payer: Self-pay | Admitting: Internal Medicine

## 2015-10-05 ENCOUNTER — Encounter (HOSPITAL_COMMUNITY): Payer: Self-pay | Admitting: *Deleted

## 2015-10-05 ENCOUNTER — Other Ambulatory Visit: Payer: Self-pay | Admitting: Internal Medicine

## 2015-10-05 NOTE — Progress Notes (Signed)
Pt denies SOB and chest pain but is under the care of Dr. Caryl Comes, Cardiology (see clearance note in EPIC). Pt made aware to stop taking vitamins, Fish oil,  Krill oil, herbal medications. Do not take any NSAIDs ie: Ibuprofen, Advil, Naproxen, BC and Goody Powder or any medication containing Aspirin. Pt verbalized understanding of all pre-op instructions.

## 2015-10-05 NOTE — Anesthesia Preprocedure Evaluation (Addendum)
Anesthesia Evaluation  Patient identified by MRN, date of birth, ID band Patient awake    Reviewed: Allergy & Precautions, H&P , NPO status , Patient's Chart, lab work & pertinent test results  Airway Mallampati: II  TM Distance: >3 FB Neck ROM: Full    Dental no notable dental hx. (+) Teeth Intact, Dental Advisory Given   Pulmonary neg pulmonary ROS, sleep apnea and Continuous Positive Airway Pressure Ventilation , former smoker,    Pulmonary exam normal breath sounds clear to auscultation       Cardiovascular hypertension, + pacemaker  Rhythm:Regular Rate:Normal     Neuro/Psych Anxiety Depression negative neurological ROS  negative psych ROS   GI/Hepatic Neg liver ROS, GERD  Poorly Controlled,  Endo/Other  Morbid obesity  Renal/GU negative Renal ROS  negative genitourinary   Musculoskeletal  (+) Arthritis ,   Abdominal   Peds  Hematology negative hematology ROS (+)   Anesthesia Other Findings   Reproductive/Obstetrics negative OB ROS                            Anesthesia Physical Anesthesia Plan  ASA: III  Anesthesia Plan: General   Post-op Pain Management:    Induction: Intravenous, Rapid sequence and Cricoid pressure planned  Airway Management Planned: Oral ETT  Additional Equipment:   Intra-op Plan:   Post-operative Plan: Extubation in OR  Informed Consent: I have reviewed the patients History and Physical, chart, labs and discussed the procedure including the risks, benefits and alternatives for the proposed anesthesia with the patient or authorized representative who has indicated his/her understanding and acceptance.   Dental advisory given  Plan Discussed with: CRNA  Anesthesia Plan Comments:        Anesthesia Quick Evaluation

## 2015-10-06 ENCOUNTER — Ambulatory Visit (HOSPITAL_COMMUNITY): Payer: BC Managed Care – PPO

## 2015-10-06 ENCOUNTER — Ambulatory Visit (HOSPITAL_COMMUNITY): Payer: BC Managed Care – PPO | Admitting: Anesthesiology

## 2015-10-06 ENCOUNTER — Encounter (HOSPITAL_COMMUNITY): Payer: Self-pay | Admitting: *Deleted

## 2015-10-06 ENCOUNTER — Ambulatory Visit (HOSPITAL_COMMUNITY): Admission: RE | Admit: 2015-10-06 | Payer: BC Managed Care – PPO | Source: Ambulatory Visit

## 2015-10-06 ENCOUNTER — Ambulatory Visit (HOSPITAL_COMMUNITY)
Admission: RE | Admit: 2015-10-06 | Discharge: 2015-10-06 | Disposition: A | Discharge status: Home / Self Care | Payer: BC Managed Care – PPO | Source: Ambulatory Visit | Attending: Gastroenterology | Admitting: Gastroenterology

## 2015-10-06 ENCOUNTER — Encounter (HOSPITAL_COMMUNITY)
Admission: RE | Disposition: A | Discharge status: Home / Self Care | Payer: Self-pay | Source: Ambulatory Visit | Attending: Gastroenterology

## 2015-10-06 DIAGNOSIS — G4733 Obstructive sleep apnea (adult) (pediatric): Secondary | ICD-10-CM | POA: Insufficient documentation

## 2015-10-06 DIAGNOSIS — I10 Essential (primary) hypertension: Secondary | ICD-10-CM | POA: Diagnosis not present

## 2015-10-06 DIAGNOSIS — Z6841 Body Mass Index (BMI) 40.0 and over, adult: Secondary | ICD-10-CM | POA: Insufficient documentation

## 2015-10-06 DIAGNOSIS — R1310 Dysphagia, unspecified: Secondary | ICD-10-CM | POA: Diagnosis not present

## 2015-10-06 DIAGNOSIS — Z7982 Long term (current) use of aspirin: Secondary | ICD-10-CM | POA: Diagnosis not present

## 2015-10-06 DIAGNOSIS — K228 Other specified diseases of esophagus: Secondary | ICD-10-CM | POA: Diagnosis not present

## 2015-10-06 DIAGNOSIS — K222 Esophageal obstruction: Secondary | ICD-10-CM

## 2015-10-06 DIAGNOSIS — Z87891 Personal history of nicotine dependence: Secondary | ICD-10-CM | POA: Diagnosis not present

## 2015-10-06 DIAGNOSIS — K2289 Other specified disease of esophagus: Secondary | ICD-10-CM

## 2015-10-06 DIAGNOSIS — K220 Achalasia of cardia: Secondary | ICD-10-CM

## 2015-10-06 DIAGNOSIS — Z950 Presence of cardiac pacemaker: Secondary | ICD-10-CM | POA: Insufficient documentation

## 2015-10-06 DIAGNOSIS — R1314 Dysphagia, pharyngoesophageal phase: Secondary | ICD-10-CM | POA: Insufficient documentation

## 2015-10-06 HISTORY — PX: ESOPHAGOGASTRODUODENOSCOPY (EGD) WITH PROPOFOL: SHX5813

## 2015-10-06 HISTORY — PX: BALLOON DILATION: SHX5330

## 2015-10-06 LAB — POCT I-STAT 4, (NA,K, GLUC, HGB,HCT)
Glucose, Bld: 157 mg/dL — ABNORMAL HIGH (ref 65–99)
HCT: 41 % (ref 39.0–52.0)
Hemoglobin: 13.9 g/dL (ref 13.0–17.0)
Potassium: 4 mmol/L (ref 3.5–5.1)
Sodium: 140 mmol/L (ref 135–145)

## 2015-10-06 SURGERY — ESOPHAGOGASTRODUODENOSCOPY (EGD) WITH PROPOFOL
Anesthesia: General

## 2015-10-06 MED ORDER — ONDANSETRON HCL 4 MG/2ML IJ SOLN
INTRAMUSCULAR | Status: DC | PRN
  Administered 2015-10-06: 4 mg via INTRAVENOUS

## 2015-10-06 MED ORDER — FENTANYL CITRATE (PF) 100 MCG/2ML IJ SOLN
INTRAMUSCULAR | Status: DC | PRN
  Administered 2015-10-06 (×2): 50 ug via INTRAVENOUS

## 2015-10-06 MED ORDER — MEPERIDINE HCL 100 MG/ML IJ SOLN: 6.2500 mg | INTRAMUSCULAR | Status: DC | PRN

## 2015-10-06 MED ORDER — SUCCINYLCHOLINE CHLORIDE 20 MG/ML IJ SOLN
INTRAMUSCULAR | Status: DC | PRN
  Administered 2015-10-06: 120 mg via INTRAVENOUS

## 2015-10-06 MED ORDER — PROMETHAZINE HCL 25 MG/ML IJ SOLN: 6.2500 mg | INTRAMUSCULAR | Status: DC | PRN

## 2015-10-06 MED ORDER — SODIUM CHLORIDE 0.9 % IV SOLN: INTRAVENOUS | Status: DC

## 2015-10-06 MED ORDER — IOPAMIDOL (ISOVUE-300) INJECTION 61%
INTRAVENOUS | Status: AC
  Filled 2015-10-06: qty 50

## 2015-10-06 MED ORDER — DIATRIZOATE MEGLUMINE & SODIUM 66-10 % PO SOLN: 90.0000 mL | Freq: Once | ORAL | Status: DC

## 2015-10-06 MED ORDER — DIATRIZOATE MEGLUMINE & SODIUM 66-10 % PO SOLN
ORAL | Status: AC
  Filled 2015-10-06: qty 90

## 2015-10-06 MED ORDER — PROPOFOL 10 MG/ML IV BOLUS
INTRAVENOUS | Status: DC | PRN
  Administered 2015-10-06: 200 mg via INTRAVENOUS

## 2015-10-06 MED ORDER — LIDOCAINE HCL (CARDIAC) 20 MG/ML IV SOLN
INTRAVENOUS | Status: DC | PRN
  Administered 2015-10-06: 60 mg via INTRATRACHEAL

## 2015-10-06 MED ORDER — LACTATED RINGERS IV SOLN
INTRAVENOUS | Status: DC
  Administered 2015-10-06: 09:00:00 via INTRAVENOUS
  Administered 2015-10-06: 1000 mL via INTRAVENOUS

## 2015-10-06 NOTE — Anesthesia Postprocedure Evaluation (Signed)
Anesthesia Post Note  Patient: Damany Lannan  Procedure(s) Performed: Procedure(s) (LRB): ESOPHAGOGASTRODUODENOSCOPY (EGD) WITH PROPOFOL (N/A) BALLOON DILATION (N/A)  Patient location during evaluation: PACU Anesthesia Type: General Level of consciousness: awake and alert Pain management: pain level controlled Vital Signs Assessment: post-procedure vital signs reviewed and stable Respiratory status: spontaneous breathing, nonlabored ventilation and respiratory function stable Cardiovascular status: blood pressure returned to baseline and stable Postop Assessment: no signs of nausea or vomiting Anesthetic complications: no    Last Vitals:  Filed Vitals:   10/06/15 0833  Temp: 36.9 C  Resp: 22    Last Pain: There were no vitals filed for this visit.               Toshiyuki Fredell,W. EDMOND

## 2015-10-06 NOTE — Progress Notes (Signed)
Pt discharged from [facility] without discharge instructions being handed to him. Pt and pt's wife verbalized discharge instructions at time of discharge. Discharge instructions mailed to pt. Jobe Igo, RN

## 2015-10-06 NOTE — H&P (Signed)
[facility] History and Physical   Primary Care Physician:  Elsie Stain, MD   Reason for Procedure:   Dysphagia/Achalasia  Plan:    EGD with 30 mm pneumatic balloon dilation     HPI: Glenn Lyons is a 60 y.o. male with h/o dysphagia and findings suggestive of achalasia based on esophagogram and esophageal manometry is here for pneumatic balloon dilation with 30 mm balloon dilation. Denies any nausea, vomiting, abdominal pain, melena or bright red blood per rectum    Past Medical History  Diagnosis Date  . Osteoarthrosis, unspecified whether generalized or localized, unspecified site   . Achalasia     with prev eval at [facility].  No intervention as of 2012  . Cardiac pacemaker St. Jude     ERI 2008  . Depressive disorder, not elsewhere classified   . Esophageal reflux   . Osteoarthrosis, unspecified whether generalized or localized, unspecified site   . Stricture and stenosis of esophagus   . Backache, unspecified   . Morbid obesity ([place])   . Sinus bradycardia /pauses   . Presence of permanent cardiac pacemaker     St. Jude- Dr. Caryl Comes follows -device Check 07-06-14  . Obstructive sleep apnea     biapap settign 25-22  . Aspiration pneumonia ([place]) 10/05/2014    Past Surgical History  Procedure Laterality Date  . Pacemaker insertion    . Colonoscopy w/ polypectomy  11/01/2010  . Esophageal dilation    . Colonoscopy with propofol N/A 10/05/2014    Procedure: COLONOSCOPY WITH PROPOFOL;  Surgeon: Gatha Mayer, MD;  Location: [facility];  Service: Endoscopy;  Laterality: N/A;  . Esophagogastroduodenoscopy N/A 10/09/2014    Procedure: ESOPHAGOGASTRODUODENOSCOPY (EGD);  Surgeon: Gatha Mayer, MD;  Location: [facility];  Service: Endoscopy;  Laterality: N/A;  . Esophageal manometry N/A 08/23/2015    Procedure: ESOPHAGEAL MANOMETRY (EM);  Surgeon: Mauri Pole, MD;  Location: [facility];  Service: Endoscopy;  Laterality: N/A;  . Esophagogastroduodenoscopy (egd)  with propofol N/A 08/23/2015    Procedure: ESOPHAGOGASTRODUODENOSCOPY (EGD) WITH PROPOFOL;  Surgeon: Mauri Pole, MD;  Location: [facility];  Service: Endoscopy;  Laterality: N/A;    Prior to Admission medications   Medication Sig Start Date End Date Taking? Authorizing Provider  acetaminophen (TYLENOL) 325 MG tablet Take 2 tablets (650 mg total) by mouth every 6 (six) hours as needed (headache, shoulder pain). 10/10/14  Yes Robbie Lis, MD  aspirin 81 MG tablet Take 81 mg by mouth daily.     Yes Historical Provider, MD  citalopram (CELEXA) 20 MG tablet Take 20 mg by mouth daily.   Yes Historical Provider, MD  furosemide (LASIX) 20 MG tablet TAKE 1 TO 2 TABLETS EVERY DAY, USE AS LITTLE AS POSSIBLE. 10/05/14  Yes Tonia Ghent, MD  KRILL OIL PO Take 1 capsule by mouth daily.   Yes Historical Provider, MD  loratadine (CLARITIN) 10 MG tablet Take 10 mg by mouth daily.   Yes Historical Provider, MD  Multiple Vitamin (MULTIVITAMIN WITH MINERALS) TABS tablet Take 1 tablet by mouth daily.   Yes Historical Provider, MD  Cetirizine HCl 10 MG CAPS Take 10 mg by mouth daily.    Historical Provider, MD    Current Facility-Administered Medications  Medication Dose Route Frequency Provider Last Rate Last Dose  . lactated ringers infusion   Intravenous Continuous Harl Bowie V, MD 10 mL/hr at 10/06/15 0851 1,000 mL at 10/06/15 0851  . meperidine (DEMEROL) injection 6.25-12.5 mg  6.25-12.5 mg Intravenous  Q5 min PRN Alexis Frock, MD      . promethazine [facility]) injection 6.25-12.5 mg  6.25-12.5 mg Intravenous Q15 min PRN Alexis Frock, MD        Allergies as of 09/01/2015 - Review Complete 09/01/2015  Allergen Reaction Noted  . Morphine Other (See Comments)     Family History  Problem Relation Age of Onset  . Heart attack Father   . Heart disease Father   . Cancer Father     multiple myeloma  . Hypertension Father   . Hypertension Mother     Social History   Social History   . Marital Status: Married    Spouse Name: N/A  . Number of Children: 2  . 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     Social History Main Topics  . Smoking status: Former Smoker -- 1.00 packs/day for 20 years    Types: Cigarettes    Quit date: 05/29/2004  . Smokeless tobacco: Never Used  . Alcohol Use: Yes     Comment: rarely  . Drug Use: No  . Sexual Activity: Yes   Other Topics Concern  . Not on file   Social History Narrative   From [employer]   Married    Review of Systems:  All other review of systems negative except as mentioned in the HPI.  Physical Exam: Vital signs in last 24 hours: Temp:  [98.4 F (36.9 C)] 98.4 F (36.9 C) (05/10 0833) Resp:  [22] 22 (05/10 0833) SpO2:  [92 %] 92 % (05/10 0833) Weight:  [375 lb (170.099 kg)] 375 lb (170.099 kg) (05/10 3845)   General:   Alert,  Well-developed, well-nourished, pleasant and cooperative in NAD Lungs:  Clear throughout to auscultation.   Heart:  Regular rate and rhythm; no murmurs, clicks, rubs,  or gallops. Abdomen:  Soft, nontender and nondistended. Normal bowel sounds.   Neuro/Psych:  Alert and cooperative. Normal mood and affect. A and O x 3   '@K' .[name], MD [facility] [phone] (pager) 10/06/2015 9:28 AM@

## 2015-10-06 NOTE — Op Note (Signed)
[facility] Patient Name: Glenn Lyons Procedure Date : 10/06/2015 MRN: [id number] Attending MD: Mauri Pole , MD Date of Birth: 01/31/1956 CSN: [id number] Age: 60 Admit Type: Outpatient Procedure:                Upper GI endoscopy Indications:              Esophageal dysphagia, Achalasia Providers:                Mauri Pole, MD, Truddie Coco, RN, Carmie End, RN, Elspeth Cho, Technician Referring MD:              Medicines:                General Anesthesia Complications:            No immediate complications. Estimated Blood Loss:     Estimated blood loss was minimal. Procedure:                Pre-Anesthesia Assessment:                           - Prior to the procedure, a History and Physical                            was performed, and patient medications and                            allergies were reviewed. The patient's tolerance of                            previous anesthesia was also reviewed. The risks                            and benefits of the procedure and the sedation                            options and risks were discussed with the patient.                            All questions were answered, and informed consent                            was obtained. Prior Anticoagulants: The patient has                            taken no previous anticoagulant or antiplatelet                            agents. ASA Grade Assessment: III - A patient with                            severe systemic disease. After reviewing the risks  and benefits, the patient was deemed in                            satisfactory condition to undergo the procedure.                           After obtaining informed consent, the endoscope was                            passed under direct vision. Throughout the                            procedure, the patient's blood pressure, pulse, and             oxygen saturations were monitored continuously. The                            [id number]) scope was introduced through the                            mouth, and advanced to the second part of duodenum.                            The upper GI endoscopy was accomplished without                            difficulty. The patient tolerated the procedure                            well. Scope In: Scope Out: Findings:      The lumen of the esophagus was moderately dilated and tortous, filled       with fluid and secretions. Tight EG junction at 45cm from incissors. A       guide wire was placed, then the scope was withdrawn. Using the wire as a       guide, dilation with a 30 mm pneumatic balloon was performed under       fluoroscopic guidance. The balloon was inflated to 17 PSI with complete       effacement of the balloon waist. Pressure was held for 60 seconds before       balloon deflation and withdrawal. There was a small amount of blood on       the balloon.      The entire examined stomach was normal.      The first portion of the duodenum and second portion of the duodenum       were normal. Impression:               - Dilated and tortous esophagus with tight EG                            junction . S/p pneumatic 64mm balloon dilation as                            per standard technique Moderate Sedation:      N/A Recommendation:           -  Patient has a contact number available for                            emergencies. The signs and symptoms of potential                            delayed complications were discussed with the                            patient. Return to normal activities tomorrow.                            Written discharge instructions were provided to the                            patient.                           - NPO until gastrograffin esophagogram                           - Follow up results of esophgaogram prior to                             discharge home                           - Clear/Full liquid diet for 1 day, then advance as                            tolerated to mechanical soft diet for 4 days.                            Thereafter can resume previous diet                           - Continue present medications. Procedure Code(s):        --- Professional ---                           [phone], Esophagogastroduodenoscopy, flexible,                            transoral; with dilation of esophagus with balloon                            (30 mm diameter or larger) (includes fluoroscopic                            guidance, when performed) Diagnosis Code(s):        --- Professional ---                           K22.8, Other specified diseases of esophagus  R13.14, Dysphagia, pharyngoesophageal phase CPT copyright 2016 American [employer] Association. All rights reserved. The codes documented in this report are preliminary and upon coder review may  be revised to meet current compliance requirements. Mauri Pole, MD 10/06/2015 10:43:49 AM This report has been signed electronically. Number of Addenda: 0

## 2015-10-06 NOTE — Transfer of Care (Signed)
Immediate Anesthesia Transfer of Care Note  Patient: Glenn Lyons  Procedure(s) Performed: Procedure(s) with comments: ESOPHAGOGASTRODUODENOSCOPY (EGD) WITH PROPOFOL (N/A) - Rigiflex ballon size 59mm-35mm size 45 minute proc,need Fluro Gastografin esophagram 2 hrs post EGD  BALLOON DILATION (N/A)  Patient Location: Endoscopy Unit  Anesthesia Type:General  Level of Consciousness: awake, alert , oriented and patient cooperative  Airway & Oxygen Therapy: Patient Spontanous Breathing and Patient connected to face mask oxygen  Post-op Assessment: Report given to RN and Post -op Vital signs reviewed and stable  Post vital signs: Reviewed and stable  Last Vitals:  Filed Vitals:   10/06/15 0833  Temp: 36.9 C  Resp: 22    Last Pain: There were no vitals filed for this visit.       Complications: No apparent anesthesia complications

## 2015-10-11 ENCOUNTER — Other Ambulatory Visit: Payer: Self-pay | Admitting: Family Medicine

## 2015-10-11 MED ORDER — ACETAMINOPHEN 325 MG PO TABS: 650.0000 mg | ORAL_TABLET | Freq: Four times a day (QID) | ORAL | Status: DC | PRN

## 2015-10-11 MED ORDER — ACETAMINOPHEN 325 MG PO TABS
650.0000 mg | ORAL_TABLET | Freq: Four times a day (QID) | ORAL | Status: DC | PRN
Start: 2015-10-11 — End: 2015-10-11

## 2015-10-11 NOTE — Progress Notes (Signed)
Tylenol rx request in epic.  Sent.  Thanks.

## 2015-10-11 NOTE — Progress Notes (Signed)
[facility] called to ck on controlled substance that was sent electronically today; and cannot be done that way. I called [facility] and advised acetaminophen 325 mg was sent; not a controlled substance. [facility] voiced understanding and nothing further needed.

## 2015-10-11 NOTE — Telephone Encounter (Signed)
rx sent

## 2015-10-11 NOTE — Addendum Note (Signed)
Addended by: Josetta Huddle on: 10/11/2015 04:38 PM   Modules accepted: Orders

## 2015-10-20 ENCOUNTER — Other Ambulatory Visit: Payer: Self-pay | Admitting: Family Medicine

## 2015-10-20 NOTE — Telephone Encounter (Signed)
Had been on, okay to continue prn use. Thanks.

## 2015-10-20 NOTE — Telephone Encounter (Signed)
Medication is not on pts current medication list. pls advise 

## 2015-10-29 ENCOUNTER — Telehealth: Payer: Self-pay | Admitting: Gastroenterology

## 2015-10-29 NOTE — Telephone Encounter (Signed)
Patient rescheduled with the patient's wife for 12/06/15 1:30

## 2015-11-04 ENCOUNTER — Ambulatory Visit: Payer: Self-pay | Admitting: Gastroenterology

## 2015-12-06 ENCOUNTER — Encounter: Payer: Self-pay | Admitting: Gastroenterology

## 2015-12-06 ENCOUNTER — Ambulatory Visit (INDEPENDENT_AMBULATORY_CARE_PROVIDER_SITE_OTHER): Payer: BC Managed Care – PPO | Admitting: Gastroenterology

## 2015-12-06 VITALS — BP 126/60 | HR 72 | Ht 71.0 in | Wt 372.0 lb

## 2015-12-06 DIAGNOSIS — K220 Achalasia of cardia: Secondary | ICD-10-CM

## 2015-12-06 DIAGNOSIS — R1310 Dysphagia, unspecified: Secondary | ICD-10-CM | POA: Diagnosis not present

## 2015-12-06 NOTE — Progress Notes (Signed)
Glenn Lyons    [id number]    06-08-1955  Primary Care Physician:Graham Damita Dunnings, MD  Referring Physician: Tonia Ghent, MD [place], [place] [address]  Chief complaint:  Achalasia HPI: 60 year old male with chronic history of dysphagia esophageal manometry consistent with type III achalasia is here for follow-up status post pneumatic balloon dilation in May 2017. Patient reports doing well overall and he is swallowing much better, no longer regurgitating food or liquids. He thinks he may be gaining some more weight as he is digesting food better. Denies any nausea, vomiting, abdominal pain, melena or bright red blood per rectum      Outpatient Encounter Prescriptions as of 12/06/2015  Medication Sig  . acetaminophen (TYLENOL) 325 MG tablet Take 2 tablets (650 mg total) by mouth every 6 (six) hours as needed (headache, shoulder pain).  Marland Kitchen aspirin 81 MG tablet Take 81 mg by mouth daily.    . furosemide (LASIX) 20 MG tablet TAKE 1 TO 2 TABLETS EVERY DAY, USE AS LITTLE AS POSSIBLE.  Marland Kitchen KRILL OIL PO Take 1 capsule by mouth daily.  Marland Kitchen loratadine (CLARITIN) 10 MG tablet Take 10 mg by mouth daily.  . Multiple Vitamin (MULTIVITAMIN WITH MINERALS) TABS tablet Take 1 tablet by mouth daily.  . [DISCONTINUED] citalopram (CELEXA) 20 MG tablet Take 20 mg by mouth daily.   No facility-administered encounter medications on file as of 12/06/2015.    Allergies as of 12/06/2015 - Review Complete 12/06/2015  Allergen Reaction Noted  . Morphine Other (See Comments)     Past Medical History  Diagnosis Date  . Osteoarthrosis, unspecified whether generalized or localized, unspecified site   . Achalasia     with prev eval at [facility].  No intervention as of 2012  . Cardiac pacemaker St. Jude     ERI 2008  . Depressive disorder, not elsewhere classified   . Esophageal reflux   . Osteoarthrosis, unspecified whether generalized or localized, unspecified site   . Stricture and  stenosis of esophagus   . Backache, unspecified   . Morbid obesity ([place])   . Sinus bradycardia /pauses   . Presence of permanent cardiac pacemaker     St. Jude- Dr. Caryl Comes follows -device Check 07-06-14  . Obstructive sleep apnea     biapap settign 25-22  . Aspiration pneumonia ([place]) 10/05/2014    Past Surgical History  Procedure Laterality Date  . Pacemaker insertion    . Colonoscopy w/ polypectomy  11/01/2010  . Esophageal dilation    . Colonoscopy with propofol N/A 10/05/2014    Procedure: COLONOSCOPY WITH PROPOFOL;  Surgeon: Gatha Mayer, MD;  Location: [facility];  Service: Endoscopy;  Laterality: N/A;  . Esophagogastroduodenoscopy N/A 10/09/2014    Procedure: ESOPHAGOGASTRODUODENOSCOPY (EGD);  Surgeon: Gatha Mayer, MD;  Location: [facility];  Service: Endoscopy;  Laterality: N/A;  . Esophageal manometry N/A 08/23/2015    Procedure: ESOPHAGEAL MANOMETRY (EM);  Surgeon: Mauri Pole, MD;  Location: [facility];  Service: Endoscopy;  Laterality: N/A;  . Esophagogastroduodenoscopy (egd) with propofol N/A 08/23/2015    Procedure: ESOPHAGOGASTRODUODENOSCOPY (EGD) WITH PROPOFOL;  Surgeon: Mauri Pole, MD;  Location: [facility];  Service: Endoscopy;  Laterality: N/A;  . Esophagogastroduodenoscopy (egd) with propofol N/A 10/06/2015    Procedure: ESOPHAGOGASTRODUODENOSCOPY (EGD) WITH PROPOFOL;  Surgeon: Mauri Pole, MD;  Location: [facility];  Service: Endoscopy;  Laterality: N/A;  Rigiflex ballon size 43m-35mm size 45 minute proc,need Fluro  Gastografin esophagram 2 hrs post EGD   . Balloon dilation N/A 10/06/2015    Procedure: BALLOON DILATION;  Surgeon: Mauri Pole, MD;  Location: [facility];  Service: Endoscopy;  Laterality: N/A;    Family History  Problem Relation Age of Onset  . Heart attack Father   . Heart disease Father   . Cancer Father     multiple myeloma  . Hypertension Father   . Hypertension Mother     Social History   Social History   . Marital Status: Married    Spouse Name: N/A  . Number of Children: 2  . 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     Social History Main Topics  . Smoking status: Former Smoker -- 1.00 packs/day for 20 years    Types: Cigarettes    Quit date: 05/29/2004  . Smokeless tobacco: Never Used  . Alcohol Use: Yes     Comment: rarely  . Drug Use: No  . Sexual Activity: Yes   Other Topics Concern  . Not on file   Social History Narrative   From [employer]   Married      Review of systems: Review of Systems  Constitutional: Negative for fever and chills.  HENT: Negative.   Eyes: Negative for blurred vision.  Respiratory: Negative for cough, shortness of breath and wheezing.   Cardiovascular: Negative for chest pain and palpitations.  Gastrointestinal: as per HPI Genitourinary: Negative for dysuria, urgency, frequency and hematuria.  Musculoskeletal: Negative for myalgias, back pain and joint pain.  Skin: Negative for itching and rash.  Neurological: Negative for dizziness, tremors, focal weakness, seizures and loss of consciousness.  Endo/Heme/Allergies: Negative for environmental allergies.  Psychiatric/Behavioral: Negative for depression, suicidal ideas and hallucinations.  All other systems reviewed and are negative.   Physical Exam: Filed Vitals:   12/06/15 1334  BP: 126/60  Pulse: 72   Gen:      No acute distress HEENT:  EOMI, sclera anicteric Neck:     No masses; no thyromegaly Ext:    No edema; adequate peripheral perfusion Skin:      Warm and dry; no rash Neuro: alert and oriented x 3 Psych: normal mood and affect  Data Reviewed: Reviewed chart in epic   Assessment and Plan/Recommendations:  60 year old male with chronic dysphagia, type III achalasia (spastic achalasia) s/o pneumatic balloon dilation with 30 mm balloon is here for follow up visit with significant improvement of dysphagia and has no  further episodes of regurgitation.  Advised patient to follow diet and exercise to loose weight and avoid any further weight gain Sleep with head end elevation Will need annual follow up and barium esophagogram once every 2-3 years to assess esophageal function Due for recall colonoscopy in 2019, will follow up with Dr Damita Lack , MD [phone] Mon-Fri 8a-5p [phone] after 5p, weekends, holidays  CC: Tonia Ghent, MD

## 2015-12-06 NOTE — Patient Instructions (Signed)
Follow up in one year with Dr Carlean Purl

## 2015-12-16 ENCOUNTER — Ambulatory Visit: Payer: Self-pay | Admitting: Pulmonary Disease

## 2015-12-31 ENCOUNTER — Ambulatory Visit: Payer: Self-pay | Admitting: Gastroenterology

## 2016-01-13 ENCOUNTER — Other Ambulatory Visit: Payer: Self-pay | Admitting: *Deleted

## 2016-01-13 MED ORDER — CITALOPRAM HYDROBROMIDE 20 MG PO TABS: 20.0000 mg | ORAL_TABLET | Freq: Every day | ORAL | 5 refills | Status: DC

## 2016-01-13 NOTE — Telephone Encounter (Addendum)
Faxed refill request. Last Filled:   #30  5 RF on 07/06/15.  Last office visit:   11/16/14.  Please advise.

## 2016-01-13 NOTE — Telephone Encounter (Signed)
Due for CPE.  Sent. Thanks.

## 2016-01-14 ENCOUNTER — Other Ambulatory Visit: Payer: Self-pay | Admitting: Family Medicine

## 2016-01-14 NOTE — Telephone Encounter (Signed)
Patient advised to schedule CPE.

## 2016-02-08 IMAGING — DX DG CHEST 1V PORT
1 series · 1 of 1 positions shown · non-contrast
Comparison: October 06, 2014

CLINICAL DATA: Cough and fever

EXAM:
PORTABLE CHEST - 1 VIEW

[chest ap]
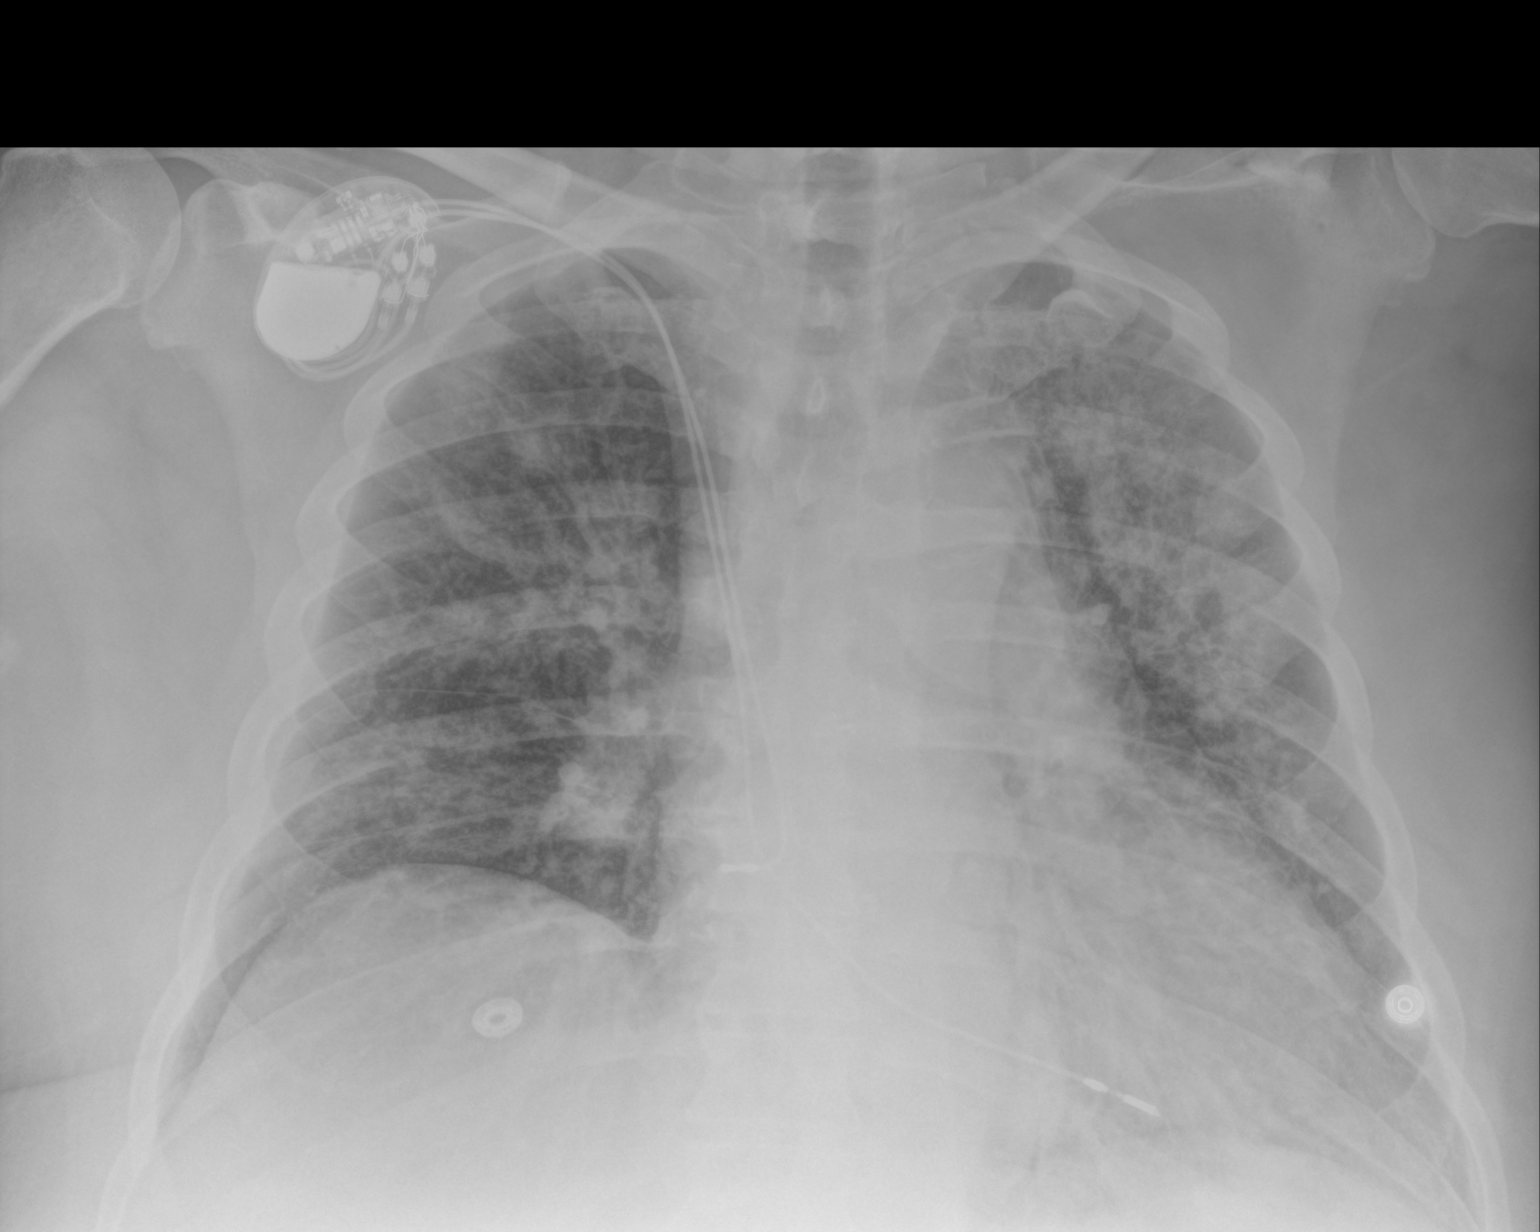

[1 of 1 positions shown; findings below may reference images not displayed]

FINDINGS: There is patchy airspace consolidation throughout much of the left
lung, stable. On the right, there is patchy interstitial and
alveolar opacity, essentially stable. No new opacities are
identified. Heart is enlarged with pulmonary vascularity within
normal limits. Pacemaker leads are attached to the right atrium and
right ventricle. No adenopathy. No pneumothorax.
IMPRESSION: Areas of interstitial and patchy alveolar opacity, more on the left
than on the right, stable. Suspect atypical appearance of edema with
congestive heart failure, although multifocal pneumonia may present
in this manner. Both entities may exist concurrently. No appreciable
change compared to study from 2 days prior.

## 2016-02-17 ENCOUNTER — Ambulatory Visit (INDEPENDENT_AMBULATORY_CARE_PROVIDER_SITE_OTHER): Payer: BC Managed Care – PPO | Admitting: *Deleted

## 2016-02-17 DIAGNOSIS — I495 Sick sinus syndrome: Secondary | ICD-10-CM | POA: Diagnosis not present

## 2016-02-17 LAB — CUP PACEART INCLINIC DEVICE CHECK
Battery Voltage: 2.78 V
Brady Statistic RA Percent Paced: 1 % — CL
Date Time Interrogation Session: 20170921092932
Implantable Lead Implant Date: 20061219
Implantable Lead Implant Date: 20061219
Implantable Lead Location: 753859
Implantable Lead Location: 753860
Lead Channel Impedance Value: 305 Ohm
Lead Channel Impedance Value: 798 Ohm
Lead Channel Pacing Threshold Amplitude: 0.75 V
Lead Channel Pacing Threshold Amplitude: 1 V
Lead Channel Pacing Threshold Pulse Width: 0.4 ms
Lead Channel Pacing Threshold Pulse Width: 0.5 ms
Lead Channel Sensing Intrinsic Amplitude: 5 mV
Lead Channel Sensing Intrinsic Amplitude: 9.1 mV
Lead Channel Setting Pacing Amplitude: 2 V
Lead Channel Setting Pacing Pulse Width: 0.5 ms
Lead Channel Setting Sensing Sensitivity: 2.5 mV
Pulse Gen Model: 5816
Pulse Gen Serial Number: 1622388

## 2016-02-17 NOTE — Progress Notes (Signed)
Pacemaker check in clinic. Normal device function. Thresholds, sensing, impedances consistent with previous measurements. Device programmed to maximize longevity. No mode switch or high ventricular rates noted. Device programmed at appropriate safety margins. Histogram distribution appropriate for patient activity level. Device programmed to optimize intrinsic conduction. Estimated longevity 2.75-4.32yrs. ROV with SK 07/2016.

## 2016-03-06 ENCOUNTER — Ambulatory Visit (INDEPENDENT_AMBULATORY_CARE_PROVIDER_SITE_OTHER): Payer: BC Managed Care – PPO | Admitting: Pulmonary Disease

## 2016-03-06 ENCOUNTER — Encounter: Payer: Self-pay | Admitting: Pulmonary Disease

## 2016-03-06 VITALS — BP 138/88 | HR 62 | Ht 71.0 in | Wt 376.0 lb

## 2016-03-06 DIAGNOSIS — Z6841 Body Mass Index (BMI) 40.0 and over, adult: Secondary | ICD-10-CM | POA: Diagnosis not present

## 2016-03-06 DIAGNOSIS — G4733 Obstructive sleep apnea (adult) (pediatric): Secondary | ICD-10-CM

## 2016-03-06 NOTE — Progress Notes (Signed)
Current Outpatient Prescriptions on File Prior to Visit  Medication Sig  . acetaminophen (TYLENOL) 325 MG tablet Take 2 tablets (650 mg total) by mouth every 6 (six) hours as needed (headache, shoulder pain).  Marland Kitchen aspirin 81 MG tablet Take 81 mg by mouth daily.    . citalopram (CELEXA) 20 MG tablet Take 1 tablet (20 mg total) by mouth daily.  . furosemide (LASIX) 20 MG tablet TAKE 1 TO 2 TABLETS EVERY DAY, USE AS LITTLE AS POSSIBLE.  Marland Kitchen loratadine (CLARITIN) 10 MG tablet Take 10 mg by mouth daily.  . Multiple Vitamin (MULTIVITAMIN WITH MINERALS) TABS tablet Take 1 tablet by mouth daily.   No current facility-administered medications on file prior to visit.      Chief Complaint  Patient presents with  . Follow-up    Former [facility] pt. Wears CPAP nightly and with naps.  Denies problems with mask/pressure.  DME: [facility].     Sleep tests PSG 2007 >> AHI 107 BiPAP 02/05/16 to 03/05/16 >> used on 30 of 30 nights with average 7 hrs 33 min.  Average AHI 1.9 with BiPAP 25/21 cm H2O  Past medical history Achalasia, Depression, OA, Sinus bradycardia s/p PM, Pneumonia 2016  Past surgical history, Family history, Social history, Allergies reviewed.  Vital Signs BP 138/88 (BP Location: Right Arm, Cuff Size: Normal) Comment: pt had dental work done today  Pulse 62   Ht 5\' 11"  (1.803 m)   Wt (!) 376 lb (170.6 kg)   SpO2 93%   BMI 52.44 kg/m   History of Present Illness Yug Jha is a 60 y.o. male with obstructive sleep apnea.  He is doing well with Bipap.  This helps his sleep.  He is able to tolerate high pressures w/o difficulty.  He uses nasal mask.  He travels a lot for work, and wants to buy a spare bipap machine.  He was seen by his dentist and asked whether he would want an oral appliance.  Physical Exam  General - No distress ENT - No sinus tenderness, no oral exudate, no LAN, MP 3, 2+ tonsils Cardiac - s1s2 regular, no murmur Chest - No wheeze/rales/dullness Back - No focal  tenderness Abd - Soft, non-tender Ext - No edema Neuro - Normal strength Skin - No rashes Psych - normal mood, and behavior   Assessment/Plan  Obstructive sleep apnea. - he is compliant with BiPAP and reports benefit - continue BiPAP 25/21 cm H2O - given him script to buy spare BiPAP >> he will pay out of pocket for this  Obesity. - discussed importance of weight loss   Patient Instructions  Follow up in 1 year    Chesley Mires, MD [place] Pager:  [phone] 03/06/2016, 2:44 PM

## 2016-03-06 NOTE — Patient Instructions (Signed)
Follow up in 1 year.

## 2016-03-13 ENCOUNTER — Ambulatory Visit: Payer: Self-pay | Admitting: Family Medicine

## 2016-03-14 ENCOUNTER — Telehealth: Payer: Self-pay | Admitting: *Deleted

## 2016-03-14 NOTE — Telephone Encounter (Signed)
PA for Ciclopirox started thru [facility].  A supporting ICD-10 code could not be found.  A form was placed in Dr. Josefine Class In Box for completion.

## 2016-03-16 ENCOUNTER — Ambulatory Visit (INDEPENDENT_AMBULATORY_CARE_PROVIDER_SITE_OTHER): Payer: BC Managed Care – PPO | Admitting: Family Medicine

## 2016-03-16 ENCOUNTER — Ambulatory Visit: Payer: Self-pay | Admitting: Family Medicine

## 2016-03-16 ENCOUNTER — Encounter: Payer: Self-pay | Admitting: Family Medicine

## 2016-03-16 VITALS — BP 136/86 | HR 71 | Temp 97.9°F | Wt 377.8 lb

## 2016-03-16 DIAGNOSIS — R350 Frequency of micturition: Secondary | ICD-10-CM | POA: Diagnosis not present

## 2016-03-16 DIAGNOSIS — R399 Unspecified symptoms and signs involving the genitourinary system: Secondary | ICD-10-CM | POA: Diagnosis not present

## 2016-03-16 LAB — BASIC METABOLIC PANEL
BUN: 11 mg/dL (ref 6–23)
CO2: 31 mEq/L (ref 19–32)
Calcium: 9.7 mg/dL (ref 8.4–10.5)
Chloride: 101 mEq/L (ref 96–112)
Creatinine, Ser: 0.99 mg/dL (ref 0.40–1.50)
GFR: 82.32 mL/min (ref >60.00)
Glucose, Bld: 202 mg/dL — ABNORMAL HIGH (ref 70–99)
Potassium: 4.3 mEq/L (ref 3.5–5.1)
Sodium: 140 mEq/L (ref 135–145)

## 2016-03-16 LAB — POC URINALSYSI DIPSTICK (AUTOMATED)
Bilirubin, UA: NEGATIVE
Blood, UA: NEGATIVE
Glucose, UA: NEGATIVE
Ketones, UA: NEGATIVE
Leukocytes, UA: NEGATIVE
Nitrite, UA: NEGATIVE
Protein, UA: NEGATIVE
Spec Grav, UA: >=1.03
Urobilinogen, UA: 1
pH, UA: 6

## 2016-03-16 LAB — PSA: PSA: 0.78 ng/mL (ref 0.10–4.00)

## 2016-03-16 MED ORDER — TADALAFIL 5 MG PO TABS
2.5000 mg | ORAL_TABLET | Freq: Every day | ORAL | 5 refills | Status: DC | PRN
Start: 2016-03-16 — End: 2017-06-12

## 2016-03-16 NOTE — Progress Notes (Signed)
Pre visit review using our clinic review tool, if applicable. No additional management support is needed unless otherwise documented below in the visit note. 

## 2016-03-16 NOTE — Assessment & Plan Note (Signed)
Likely from prostatic enlargement. DRE was deferred as it would not change management. If his prostate felt small normal or large, we would still proceed with same medications and check a PSA. Discussed with patient. He agrees. Unclear if he has a family history of prostate cancer. Chart was updated.  Okay to try Cialis in the meantime. Prescription sent. Check PSA and basic labs today. See notes on labs. Anatomy discussed with patient. He agrees with plan.

## 2016-03-16 NOTE — Patient Instructions (Addendum)
Go to the lab on the way out.  We'll contact you with your lab report. Try cialis in the meantime.  See how much that helps.   Update me as needed.   Schedule a physical when possible.

## 2016-03-16 NOTE — Progress Notes (Signed)
Inc in urination.  Frequency, urgency.  Slower stream. Terminal dribbling. Lasix likely makes this worse, but he needs it for BLE edema.  Gradually worse in the last 1-2 years.  Some ED and dec in libido.    Possible FH prostate cancer, unclear dx with his father.   He prev had esophageal stretching and is swallowing better.  He isn't regurgitating.    Meds, vitals, and allergies reviewed.   ROS: Per HPI unless specifically indicated in ROS section   nad ncat Neck supple, no LA rrr ctab abd soft DRE referred.

## 2016-03-17 ENCOUNTER — Encounter: Payer: Self-pay | Admitting: Family Medicine

## 2016-03-17 ENCOUNTER — Other Ambulatory Visit: Payer: Self-pay | Admitting: Family Medicine

## 2016-03-17 DIAGNOSIS — E119 Type 2 diabetes mellitus without complications: Secondary | ICD-10-CM

## 2016-03-17 DIAGNOSIS — E1140 Type 2 diabetes mellitus with diabetic neuropathy, unspecified: Secondary | ICD-10-CM | POA: Insufficient documentation

## 2016-03-20 ENCOUNTER — Telehealth: Payer: Self-pay | Admitting: *Deleted

## 2016-03-20 NOTE — Telephone Encounter (Signed)
PA for Ciclopirox sent thru [facility], awaiting response.

## 2016-03-27 ENCOUNTER — Encounter: Payer: Self-pay | Admitting: Family Medicine

## 2016-03-29 ENCOUNTER — Other Ambulatory Visit: Payer: Self-pay | Admitting: Family Medicine

## 2016-04-11 ENCOUNTER — Other Ambulatory Visit: Payer: Self-pay | Admitting: Family Medicine

## 2016-04-17 ENCOUNTER — Encounter: Payer: Self-pay | Admitting: Primary Care

## 2016-04-17 ENCOUNTER — Other Ambulatory Visit: Payer: Self-pay | Admitting: Family Medicine

## 2016-04-17 ENCOUNTER — Ambulatory Visit (INDEPENDENT_AMBULATORY_CARE_PROVIDER_SITE_OTHER): Payer: BC Managed Care – PPO | Admitting: Primary Care

## 2016-04-17 ENCOUNTER — Encounter: Payer: Self-pay | Admitting: Family Medicine

## 2016-04-17 VITALS — BP 140/86 | HR 60 | Temp 97.5°F | Ht 71.0 in | Wt 377.8 lb

## 2016-04-17 DIAGNOSIS — R600 Localized edema: Secondary | ICD-10-CM | POA: Diagnosis not present

## 2016-04-17 MED ORDER — FUROSEMIDE 40 MG PO TABS: 40.0000 mg | ORAL_TABLET | Freq: Every day | ORAL | 0 refills | Status: DC

## 2016-04-17 NOTE — Patient Instructions (Signed)
I've sent another prescription for lasix for leg swelling. Take 40 mg (1 tablet) by mouth once daily for 5 days. Do not take the 20 mg tablets you have at home. Resume your 20 mg tablets once you've completed the 40 mg tablets.  Ensure you are elevated your legs when sitting. Try to get up once every hour and walk around.  Wear compression stockings to prevent swelling.  Start exercising. You should be getting 150 minutes of moderate intensity exercise weekly.  It was a pleasure meeting you!

## 2016-04-17 NOTE — Progress Notes (Signed)
Pre visit review using our clinic review tool, if applicable. No additional management support is needed unless otherwise documented below in the visit note. 

## 2016-04-17 NOTE — Progress Notes (Signed)
Subjective:    Patient ID: Glenn Lyons, male    DOB: [birth year], 58 y.o.   MRN: [id number]  HPI  Glenn Lyons is a 60 year old male with a history of Essential Hypertension, Type 2 Diabetes, and pacemaker who presents today with a chief complaint of lower extremity edema. He is managed on Lasix 20 mg PRN for which he takes daily.  He presented to Urgent Care in [facility] on 03/28/16 with a chief complaint of lower extremity swelling and pain. He was diagnosed and treated for cellulitis with Cephalexin QID 10 day course. He did complete his course of Cephalexin.   Since treatment the erythema and pain have dissipated but his swelling remains. He does have a history of lower extremity edema. He works in a sedentary occupation, he does not exercise regularly, and he does travel often in the car to and from [place]. He has been taking Lasix 20 mg daily without much improvement. He denies dyspnea upon exertion, cough, chest congestion, calf pain.    Review of Systems  Constitutional: Negative for fever.  Respiratory: Negative for cough and shortness of breath.   Cardiovascular: Positive for leg swelling. Negative for chest pain.  Musculoskeletal: Negative for myalgias.  Skin: Negative for color change.       Past Medical History:  Diagnosis Date  . Achalasia    with prev eval at [facility].  No intervention as of 2012  . Aspiration pneumonia ([place]) 10/05/2014  . Backache, unspecified   . Cardiac pacemaker St. Jude    ERI 2008  . Depressive disorder, not elsewhere classified   . Esophageal reflux   . Morbid obesity ([place])   . Obstructive sleep apnea    biapap settign 25-22  . Osteoarthrosis, unspecified whether generalized or localized, unspecified site   . Osteoarthrosis, unspecified whether generalized or localized, unspecified site   . Presence of permanent cardiac pacemaker    St. Jude- Dr. Caryl Comes follows -device Check 07-06-14  . Sinus bradycardia /pauses   . Stricture and stenosis of  esophagus      Social History   Social History  . Marital status: Married    Spouse name: N/A  . Number of children: 2  . Years of education: N/A   Occupational History  . 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  [employer]   Social History Main Topics  . Smoking status: Former Smoker    Packs/day: 1.00    Years: 20.00    Types: Cigarettes    Quit date: 05/29/2004  . Smokeless tobacco: Never Used  . Alcohol use Yes     Comment: rarely  . Drug use: No  . Sexual activity: Yes   Other Topics Concern  . Not on file   Social History Narrative   From Air cabin crew   Married    Past Surgical History:  Procedure Laterality Date  . BALLOON DILATION N/A 10/06/2015   Procedure: BALLOON DILATION;  Surgeon: Mauri Pole, MD;  Location: [facility];  Service: Endoscopy;  Laterality: N/A;  . COLONOSCOPY W/ POLYPECTOMY  11/01/2010  . COLONOSCOPY WITH PROPOFOL N/A 10/05/2014   Procedure: COLONOSCOPY WITH PROPOFOL;  Surgeon: Gatha Mayer, MD;  Location: [facility];  Service: Endoscopy;  Laterality: N/A;  . ESOPHAGEAL DILATION    . ESOPHAGEAL MANOMETRY N/A 08/23/2015   Procedure: ESOPHAGEAL MANOMETRY (EM);  Surgeon: Mauri Pole, MD;  Location: [facility];  Service: Endoscopy;  Laterality: N/A;  .  ESOPHAGOGASTRODUODENOSCOPY N/A 10/09/2014   Procedure: ESOPHAGOGASTRODUODENOSCOPY (EGD);  Surgeon: Gatha Mayer, MD;  Location: [facility];  Service: Endoscopy;  Laterality: N/A;  . ESOPHAGOGASTRODUODENOSCOPY (EGD) WITH PROPOFOL N/A 08/23/2015   Procedure: ESOPHAGOGASTRODUODENOSCOPY (EGD) WITH PROPOFOL;  Surgeon: Mauri Pole, MD;  Location: [facility];  Service: Endoscopy;  Laterality: N/A;  . ESOPHAGOGASTRODUODENOSCOPY (EGD) WITH PROPOFOL N/A 10/06/2015   Procedure: ESOPHAGOGASTRODUODENOSCOPY (EGD) WITH PROPOFOL;  Surgeon: Mauri Pole, MD;  Location: [facility];  Service: Endoscopy;  Laterality: N/A;  Rigiflex ballon size 57m-35mm size 45 minute proc,need  Fluro Gastografin esophagram 2 hrs post EGD   . PACEMAKER INSERTION      Family History  Problem Relation Age of Onset  . Heart attack Father   . Heart disease Father   . Cancer Father     multiple myeloma  . Hypertension Father   . Prostate cancer Father     possible dx  . Hypertension Mother     Allergies  Allergen Reactions  . Morphine Other (See Comments)    REACTION: Heart stopped.    Current Outpatient Prescriptions on File Prior to Visit  Medication Sig Dispense Refill  . acetaminophen (TYLENOL) 325 MG tablet Take 2 tablets (650 mg total) by mouth every 6 (six) hours as needed (headache, shoulder pain). 100 tablet 1  . aspirin 81 MG tablet Take 81 mg by mouth daily.      . citalopram (CELEXA) 20 MG tablet Take 1 tablet (20 mg total) by mouth daily. 30 tablet 5  . furosemide (LASIX) 20 MG tablet TAKE 1 TO 2 TABLETS EVERY DAY, USE AS LITTLE AS POSSIBLE. 30 tablet 5  . loratadine (CLARITIN) 10 MG tablet Take 10 mg by mouth daily.    . Multiple Vitamin (MULTIVITAMIN WITH MINERALS) TABS tablet Take 1 tablet by mouth daily.    . tadalafil (CIALIS) 5 MG tablet Take 0.5-1 tablets (2.5-5 mg total) by mouth daily as needed for erectile dysfunction. 30 tablet 5   No current facility-administered medications on file prior to visit.     BP 140/86   Pulse 60   Temp 97.5 F (36.4 C) (Oral)   Ht '5\' 11"'  (1.803 m)   Wt (!) 377 lb 12.8 oz (171.4 kg)   SpO2 97%   BMI 52.69 kg/m    Objective:   Physical Exam  Constitutional: He appears well-nourished.  Neck: Neck supple.  Cardiovascular: Normal rate and regular rhythm.   1+ edema with trace pitting to left lower extremity distal to calf with foot involvement. Negative Homan's sign.  Pulmonary/Chest: Effort normal and breath sounds normal.  Skin: Skin is warm and dry. No erythema.          Assessment & Plan:  Lower Extremity Edema:  Located to left lower extremity distal to calf with foot involvement. Exam today  without evidence of remaining cellulitis, this has healed well. 1+ edema with trace pitting. No evidence of DVT or CHF. Suspect swelling due to obesity and lack of exercise. Discussed to elevate extremities while sitting. Discussed use of compression socks/hose. Also strong recommend weight loss through healthy diet.  Will increase lasix to 40 mg for 5 days, then reduce down to 20 mg PRN. He will update if no improvement. May need echocardiogram.  CSheral Flow NP

## 2016-04-25 ENCOUNTER — Telehealth: Payer: Self-pay | Admitting: *Deleted

## 2016-04-25 NOTE — Telephone Encounter (Signed)
PA was submitted back in October but never received a response.  Request resubmitted 04/25/2016 thru [facility], awaiting response.  Form placed in Dr. Josefine Class In [place].

## 2016-04-26 NOTE — Telephone Encounter (Signed)
Left message on voicemail for patient to call back. 

## 2016-04-26 NOTE — Telephone Encounter (Signed)
Verify med use duration with patient. The usual extent of the med is 48 weeks or resolution of symptoms, whichever is sooner.   If he has been on 48 weeks, then no reason to continue.  Thanks.

## 2016-04-27 NOTE — Telephone Encounter (Signed)
Wife Glenn Lyons walled in returning call -  Please call wife back [phone] Husband phone not working well  [employer]

## 2016-04-27 NOTE — Telephone Encounter (Signed)
Left detailed message on wife's VM, awaiting response.

## 2016-05-02 NOTE — Telephone Encounter (Signed)
Left detailed message on wife's voicemail again.

## 2016-05-03 NOTE — Telephone Encounter (Signed)
Patient notified as instructed by telephone and verbalized understanding. Patient stated that he took the medication for 6 weeks and stopped it because he did not realized that he needed to take it longer. Patient stated that he will finish what he has and see if there is any improvement. Patient stated that he is okay with this.

## 2016-05-03 NOTE — Telephone Encounter (Signed)
Notify patient of the following 1. If he is been on medication for 48 weeks without resolution, then it does not make sense to continue. 2. The prior authorization on this medication will be denied if he is not had a sample of nail sent for confirmation of fungus 3. I would not recommend treatment with oral Lamisil. I do not routinely prescribe that medication due to the risk of side effects.  I don't see where he has had a nail clipping sent for analysis, so we should not try to pursue the prior authorization at this point as it will be denied. Thanks.

## 2016-05-03 NOTE — Telephone Encounter (Signed)
Thanks.  I'll shred the PA request.

## 2016-05-03 NOTE — Telephone Encounter (Signed)
Ciclopirox Solution PA denied.   Must meet these conditions:  You have a fungal infection of the nail. You had a test to confirm your nail fungus. You tried at least one oral medicine first and it did not work for you or you cannot [place] it.

## 2016-06-08 ENCOUNTER — Ambulatory Visit (INDEPENDENT_AMBULATORY_CARE_PROVIDER_SITE_OTHER): Payer: BC Managed Care – PPO | Admitting: Family Medicine

## 2016-06-08 ENCOUNTER — Encounter: Payer: Self-pay | Admitting: Family Medicine

## 2016-06-08 DIAGNOSIS — G4733 Obstructive sleep apnea (adult) (pediatric): Secondary | ICD-10-CM

## 2016-06-08 DIAGNOSIS — E119 Type 2 diabetes mellitus without complications: Secondary | ICD-10-CM

## 2016-06-08 DIAGNOSIS — R609 Edema, unspecified: Secondary | ICD-10-CM | POA: Diagnosis not present

## 2016-06-08 MED ORDER — FUROSEMIDE 20 MG PO TABS: 20.0000 mg | ORAL_TABLET | Freq: Every day | ORAL | Status: DC | PRN

## 2016-06-08 MED ORDER — CEPHALEXIN 500 MG PO CAPS: 500.0000 mg | ORAL_CAPSULE | Freq: Four times a day (QID) | ORAL | 0 refills | Status: DC

## 2016-06-08 NOTE — Patient Instructions (Addendum)
Call pulmonary about the cpap settings and follow up.   Go to the lab on the way out.  We'll contact you with your lab report. Double the lasix for 5 days.  Start the antibiotics if worse.  Take care.  Glad to see you.

## 2016-06-08 NOTE — Progress Notes (Signed)
Pre visit review using our clinic review tool, if applicable. No additional management support is needed unless otherwise documented below in the visit note. 

## 2016-06-08 NOTE — Progress Notes (Signed)
He just got another bassett hound, named [place].    He has used ciclopirox intermittently.    He is back from working in [place], will be in [place] for now.    H/o cellulitis/edema on the legs prev.  Prev treated with doxy.  He has some diffuse joint aches.  Pain walking.  This feels the way it prev did when it was starting up.    He was sleeping and eating better after having his esophagus stretched.  Waking up with dry mouth now and his dreams are atypical.  Still on CPAP at night.  Still using humidifier with CPAP.  Weight is up in the meantime.  He is fatigued.    H/o DM2 by glucose and due for A1c.  D/w pt about weight loss and prev labs.   BP has been up diffusely.    Meds, vitals, and allergies reviewed.   ROS: Per HPI unless specifically indicated in ROS section   GEN: nad, alert and oriented HEENT: mucous membranes moist NECK: supple w/o LA CV: rrr. PULM: ctab, no inc wob ABD: soft, +bs EXT: 1+BLE edema SKIN: no acute rash, no spreading erythema Shin/calf not ttp B

## 2016-06-09 LAB — HEMOGLOBIN A1C: Hgb A1c MFr Bld: 9.6 % — ABNORMAL HIGH (ref 4.6–6.5)

## 2016-06-09 NOTE — Assessment & Plan Note (Signed)
See notes on labs. 

## 2016-06-09 NOTE — Assessment & Plan Note (Signed)
Needs weight loss, d/w pt about f/u with pulmonary re: OSA and CPAP.  He agrees.  >25 minutes spent in face to face time with patient, >50% spent in counselling or coordination of care.

## 2016-06-09 NOTE — Assessment & Plan Note (Signed)
With h/o cellulitis.  No obvious cellulitis now.  D/w pt. Hold abx.  Inc lasix for now.  Needs weight reduction with diet and exercise, dw pt.  Okay for outpatient f/u.

## 2016-06-12 ENCOUNTER — Telehealth: Payer: Self-pay | Admitting: *Deleted

## 2016-06-12 ENCOUNTER — Encounter: Payer: Self-pay | Admitting: Family Medicine

## 2016-06-12 ENCOUNTER — Other Ambulatory Visit: Payer: Self-pay | Admitting: Family Medicine

## 2016-06-12 MED ORDER — METFORMIN HCL 500 MG PO TABS: 500.0000 mg | ORAL_TABLET | Freq: Two times a day (BID) | ORAL | 3 refills | Status: DC

## 2016-06-12 NOTE — Telephone Encounter (Signed)
See result notes on A1c, pasted below.  Thanks.   A1c higher than I was expecting. Clearly DM2, not controlled Needs diet and exercise.  Please call in meter and strips for patient to check sugar daily.  Needs to start metformin 500mg  a day, inc to 500mg  BID after 1 week if tolerated. rx sent.  Offer/encourage DM2 education.  Needs OV with labs ahead of time in about 3 months, but I want to hear about his sugar in the meantime, in about 2 weeks. Thanks.

## 2016-06-12 NOTE — Telephone Encounter (Signed)
Wife left voicemail at Triage. Wife received Dr. Josefine Class comments on Mychart about pt's lab results. Wife wants to know if there is any meds he should be taken or what should she do, wife said pt is out of state ([place]) working until mid Feb., so wife request call back on what should she be doing for pt

## 2016-06-13 ENCOUNTER — Other Ambulatory Visit: Payer: Self-pay | Admitting: *Deleted

## 2016-06-13 MED ORDER — ONETOUCH VERIO W/DEVICE KIT: 1.0000 | PACK | Freq: Every morning | 0 refills | Status: AC

## 2016-06-13 MED ORDER — ONETOUCH ULTRASOFT LANCETS MISC
3 refills | Status: DC
Start: 2016-06-13 — End: 2016-11-20

## 2016-06-13 MED ORDER — GLUCOSE BLOOD VI STRP: ORAL_STRIP | 3 refills | Status: DC

## 2016-06-13 NOTE — Telephone Encounter (Signed)
Wife advised of instructions.  Meter, strips and lancets sent to [facility] in [facility] for patient to get while out of town.  Patient advised to get Metformin Rx transferred in order to pickup in [facility].  Patient will call for follow up appt and labs when back in town.

## 2016-06-13 NOTE — Telephone Encounter (Signed)
Left message on patient's voicemail to return call

## 2016-06-13 NOTE — Telephone Encounter (Signed)
Wife returned call - please call back  Thanks

## 2016-06-23 ENCOUNTER — Encounter: Payer: Self-pay | Admitting: Family Medicine

## 2016-07-11 ENCOUNTER — Ambulatory Visit (INDEPENDENT_AMBULATORY_CARE_PROVIDER_SITE_OTHER): Payer: BC Managed Care – PPO | Admitting: Family Medicine

## 2016-07-11 ENCOUNTER — Encounter: Payer: Self-pay | Admitting: Family Medicine

## 2016-07-11 VITALS — BP 130/78 | HR 60 | Temp 98.0°F | Ht 71.5 in | Wt 379.5 lb

## 2016-07-11 DIAGNOSIS — K220 Achalasia of cardia: Secondary | ICD-10-CM

## 2016-07-11 DIAGNOSIS — F411 Generalized anxiety disorder: Secondary | ICD-10-CM

## 2016-07-11 DIAGNOSIS — Z0000 Encounter for general adult medical examination without abnormal findings: Secondary | ICD-10-CM

## 2016-07-11 DIAGNOSIS — Z23 Encounter for immunization: Secondary | ICD-10-CM | POA: Diagnosis not present

## 2016-07-11 DIAGNOSIS — E119 Type 2 diabetes mellitus without complications: Secondary | ICD-10-CM | POA: Diagnosis not present

## 2016-07-11 DIAGNOSIS — Z119 Encounter for screening for infectious and parasitic diseases, unspecified: Secondary | ICD-10-CM

## 2016-07-11 DIAGNOSIS — Z7189 Other specified counseling: Secondary | ICD-10-CM

## 2016-07-11 LAB — MICROALBUMIN / CREATININE URINE RATIO
Creatinine,U: 152.1 mg/dL
Microalb Creat Ratio: 0.7 mg/g (ref 0.0–30.0)
Microalb, Ur: 1 mg/dL (ref 0.0–1.9)

## 2016-07-11 MED ORDER — CITALOPRAM HYDROBROMIDE 20 MG PO TABS: 20.0000 mg | ORAL_TABLET | Freq: Every day | ORAL | 3 refills | Status: DC

## 2016-07-11 NOTE — Patient Instructions (Addendum)
Call about an eye exam when possible for a diabetic eye exam.  Go to the lab on the way out.  We'll contact you with your lab report. Recheck labs in about 3 months before a visit.   Thanks for your effort.  Continue as is for now with diet and try to gradually exercise more.   Take care.  Glad to see you.

## 2016-07-11 NOTE — Progress Notes (Signed)
CPE- See plan.  Routine anticipatory guidance given to patient.  See health maintenance. Tetanus 2018 Flu 2017 PNA 2018 Shingles 2016 Living will d/w pt.  Wife designated if patient were incapacitated.   Diet and exercise d/w pt.   Colonoscopy done 2016 PSA 2017.   Pt opts in for HCV screening.  D/w pt re: routine screening.    Achalasia.  He is clearly better from the last dilation along with diet changes.  He cut out carbs and is doing better.    H/o pacer placement.  Has f/u pending.    Diabetes:  Using medications without difficulties:yes Hypoglycemic episodes:no Hyperglycemic episodes:no Feet problems:no Blood Sugars averaging: clearly improved recently.  Prev up to 200s.  Now 140-160 two hours after meals now.  Has been ~130-160s in the AMs recently.   eye exam within last year: due.  Vision is better with better glucose control.  Due for MALB.   He cut out sodas.   His legs feel better and he has less swelling now.  His joints feel better.   He is sleeping better.    Still on SSRI and mood is better than prev.    PMH and SH reviewed  Meds, vitals, and allergies reviewed.   ROS: Per HPI.  Unless specifically indicated otherwise in HPI, the patient denies:  General: fever. Eyes: acute vision changes ENT: sore throat Cardiovascular: chest pain Respiratory: SOB GI: vomiting GU: dysuria Musculoskeletal: acute back pain Derm: acute rash Neuro: acute motor dysfunction Psych: worsening mood Endocrine: polydipsia Heme: bleeding Allergy: hayfever  GEN: nad, alert and oriented HEENT: mucous membranes moist NECK: supple w/o LA CV: rrr. PULM: ctab, no inc wob ABD: soft, +bs EXT: 1-2+ BLE edema SKIN: no acute rash  Diabetic foot exam: Normal inspection No skin breakdown No calluses  Normal DP pulses Normal sensation to light touch and monofilament Nails slightly thickened.

## 2016-07-11 NOTE — Progress Notes (Signed)
Pre visit review using our clinic review tool, if applicable. No additional management support is needed unless otherwise documented below in the visit note. 

## 2016-07-13 DIAGNOSIS — Z0000 Encounter for general adult medical examination without abnormal findings: Secondary | ICD-10-CM | POA: Insufficient documentation

## 2016-07-13 DIAGNOSIS — Z7189 Other specified counseling: Secondary | ICD-10-CM | POA: Insufficient documentation

## 2016-07-13 NOTE — Assessment & Plan Note (Signed)
Living will d/w pt.  Wife designated if patient were incapacitated.   ?

## 2016-07-13 NOTE — Assessment & Plan Note (Signed)
Mood improved with citalopram. Continue as is.

## 2016-07-13 NOTE — Assessment & Plan Note (Signed)
Improved with diet changes. Continue as is.

## 2016-07-13 NOTE — Assessment & Plan Note (Signed)
Tetanus 2018 Flu 2017 PNA 2018 Shingles 2016 Living will d/w pt.  Wife designated if patient were incapacitated.   Diet and exercise d/w pt.   Colonoscopy done 2016 PSA 2017.   Pt opts in for HCV screening.  D/w pt re: routine screening.

## 2016-07-13 NOTE — Assessment & Plan Note (Signed)
Sugar improved on home checks. Vaccinated today. Continue work on diet and exercise. Recheck labs in a few months. He agrees.

## 2016-07-27 ENCOUNTER — Encounter: Payer: Self-pay | Admitting: Family Medicine

## 2016-07-27 NOTE — Telephone Encounter (Signed)
Mrs Seefeldt left v/m requesting cb to pt [phone]. Pt has not checked sugar in 2 days because out of test strips; pt has been testing BS bid. Mrs Strausser request [facility] rx [facility] test strips to [facility]. Request cb to pt when done. Med list has ck daily. Do you want pt to ck BS bid or daily.Please advise.

## 2016-07-28 NOTE — Telephone Encounter (Signed)
Patient's wife called back.  Patient is out his test strips.

## 2016-08-18 ENCOUNTER — Encounter: Payer: Self-pay | Admitting: Internal Medicine

## 2016-08-28 ENCOUNTER — Encounter: Payer: Self-pay | Admitting: Nurse Practitioner

## 2016-08-28 ENCOUNTER — Ambulatory Visit (INDEPENDENT_AMBULATORY_CARE_PROVIDER_SITE_OTHER): Payer: BC Managed Care – PPO | Admitting: Nurse Practitioner

## 2016-08-28 VITALS — BP 134/78 | HR 60 | Temp 97.8°F | Ht 71.5 in | Wt 380.0 lb

## 2016-08-28 DIAGNOSIS — R600 Localized edema: Secondary | ICD-10-CM | POA: Diagnosis not present

## 2016-08-28 MED ORDER — CEPHALEXIN 500 MG PO CAPS: 500.0000 mg | ORAL_CAPSULE | Freq: Four times a day (QID) | ORAL | 0 refills | Status: DC

## 2016-08-28 MED ORDER — FUROSEMIDE 20 MG PO TABS: 20.0000 mg | ORAL_TABLET | Freq: Every day | ORAL | 0 refills | Status: DC | PRN

## 2016-08-28 MED ORDER — POTASSIUM CHLORIDE ER 10 MEQ PO TBCR: 10.0000 meq | EXTENDED_RELEASE_TABLET | Freq: Every day | ORAL | 0 refills | Status: DC

## 2016-08-28 NOTE — Patient Instructions (Addendum)
Take furosemide 3tabs once adt x3days, then 2tabs once a day x3days, then 1tab once a day continuously.  Maintain low sodium diet at all times.  Edema Edema is an abnormal buildup of fluids in your bodytissues. Edema is somewhatdependent on gravity to pull the fluid to the lowest place in your body. That makes the condition more common in the legs and thighs (lower extremities). Painless swelling of the feet and ankles is common and becomes more likely as you get older. It is also common in looser tissues, like around your eyes. When the affected area is squeezed, the fluid may move out of that spot and leave a dent for a few moments. This dent is called pitting. What are the causes? There are many possible causes of edema. Eating too much salt and being on your feet or sitting for a long time can cause edema in your legs and ankles. Hot weather may make edema worse. Common medical causes of edema include:  Heart failure.  Liver disease.  Kidney disease.  Weak blood vessels in your legs.  Cancer.  An injury.  Pregnancy.  Some medications.  Obesity. What are the signs or symptoms? Edema is usually painless.Your skin may look swollen or shiny. How is this diagnosed? Your health care provider may be able to diagnose edema by asking about your medical history and doing a physical exam. You may need to have tests such as X-rays, an electrocardiogram, or blood tests to check for medical conditions that may cause edema. How is this treated? Edema treatment depends on the cause. If you have heart, liver, or kidney disease, you need the treatment appropriate for these conditions. General treatment may include:  Elevation of the affected body part above the level of your heart.  Compression of the affected body part. Pressure from elastic bandages or support stockings squeezes the tissues and forces fluid back into the blood vessels. This keeps fluid from entering the  tissues.  Restriction of fluid and salt intake.  Use of a water pill (diuretic). These medications are appropriate only for some types of edema. They pull fluid out of your body and make you urinate more often. This gets rid of fluid and reduces swelling, but diuretics can have side effects. Only use diuretics as directed by your health care provider. Follow these instructions at home:  Keep the affected body part above the level of your heart when you are lying down.  Do not sit still or stand for prolonged periods.  Do not put anything directly under your knees when lying down.  Do not wear constricting clothing or garters on your upper legs.  Exercise your legs to work the fluid back into your blood vessels. This may help the swelling go down.  Wear elastic bandages or support stockings to reduce ankle swelling as directed by your health care provider.  Eat a low-salt diet to reduce fluid if your health care provider recommends it.  Only take medicines as directed by your health care provider. Contact a health care provider if:  Your edema is not responding to treatment.  You have heart, liver, or kidney disease and notice symptoms of edema.  You have edema in your legs that does not improve after elevating them.  You have sudden and unexplained weight gain. Get help right away if:  You develop shortness of breath or chest pain.  You cannot breathe when you lie down.  You develop pain, redness, or warmth in the swollen areas.  You have heart, liver, or kidney disease and suddenly get edema.  You have a fever and your symptoms suddenly get worse. This information is not intended to replace advice given to you by your health care provider. Make sure you discuss any questions you have with your health care provider. Document Released: 05/15/2005 Document Revised: 10/21/2015 Document Reviewed: 03/07/2013 Elsevier Interactive Patient Education  2017 [employer].

## 2016-08-28 NOTE — Progress Notes (Signed)
Pre visit review using our clinic review tool, if applicable. No additional management support is needed unless otherwise documented below in the visit note. 

## 2016-08-28 NOTE — Progress Notes (Signed)
Subjective:  Patient ID: Glenn Lyons, male    DOB: [birth year]  Age: 61 y.o. MRN: [id number]  CC: Leg Swelling (both legs little red and swelling,tingling sensation. BS today 164)   HPI LE edema: Chronic, waxing and waning, prn use of furosemide, unable to use compression stocking due to inadequate size. No pain with ambulation, no fever, no recent travel, no leg injury. No cough or SOB or chest pain. Worsening edema over the last 1week. Treated for cellulitis last year 04/2016, so he is concerned he might be developing similar problem. He started taking remaining keflex prescription last night.  Outpatient Medications Prior to Visit  Medication Sig Dispense Refill  . acetaminophen (TYLENOL) 325 MG tablet Take 2 tablets (650 mg total) by mouth every 6 (six) hours as needed (headache, shoulder pain). 100 tablet 1  . aspirin 81 MG tablet Take 81 mg by mouth daily.      . Blood Glucose Monitoring Suppl (ONETOUCH VERIO) w/Device KIT Inject 1 kit into the skin every morning. Use to test blood sugar each day before breakfast and 2 hours after a meal for 2 weeks.  Diagnosis:  E11.9  Non-insulin dependent. 1 kit 0  . ciclopirox (PENLAC) 8 % solution APPLY DAILY AT BEDTIME ON SKIN & NAIL OVER PREVIOUS COAT.AFTER 7 DAYS REMOVE WITH ALCOHOL & REPEAT  5  . citalopram (CELEXA) 20 MG tablet Take 1 tablet (20 mg total) by mouth daily. 90 tablet 3  . glucose blood (ONETOUCH VERIO) test strip Use as instructed to check blood sugar once daily or as instructed.  Diagnosis:  E11.9  Non insulin dependent. 100 each 3  . Lancets (ONETOUCH ULTRASOFT) lancets Use as instructed to check blood sugar once daily or as needed.  Diagnosis:  E11.9  Non insulin dependent. 100 each 3  . loratadine (CLARITIN) 10 MG tablet Take 10 mg by mouth daily.    . metFORMIN (GLUCOPHAGE) 500 MG tablet Take 1 tablet (500 mg total) by mouth 2 (two) times daily with a meal. 180 tablet 3  . Multiple Vitamin (MULTIVITAMIN WITH MINERALS)  TABS tablet Take 1 tablet by mouth daily.    . furosemide (LASIX) 20 MG tablet Take 1-2 tablets (20-40 mg total) by mouth daily as needed.    . tadalafil (CIALIS) 5 MG tablet Take 0.5-1 tablets (2.5-5 mg total) by mouth daily as needed for erectile dysfunction. (Patient not taking: Reported on 08/28/2016) 30 tablet 5   No facility-administered medications prior to visit.     ROS See HPI  Objective:  BP 134/78   Pulse 60   Temp 97.8 F (36.6 C)   Ht 5' 11.5" (1.816 m)   Wt (!) 380 lb (172.4 kg)   SpO2 98%   BMI 52.26 kg/m   BP Readings from Last 3 Encounters:  08/28/16 134/78  07/11/16 130/78  06/08/16 (!) 156/80    Wt Readings from Last 3 Encounters:  08/28/16 (!) 380 lb (172.4 kg)  07/11/16 (!) 379 lb 8 oz (172.1 kg)  06/08/16 (!) 383 lb 8 oz (174 kg)    Physical Exam  Constitutional: He is oriented to person, place, and time.  Cardiovascular: Normal rate.   Pulmonary/Chest: Effort normal.  Musculoskeletal: He exhibits edema. He exhibits no tenderness.  Neurological: He is alert and oriented to person, place, and time.  Skin: Skin is warm and dry. No erythema.  Vitals reviewed.   Lab Results  Component Value Date   WBC 18.8 (H) 10/06/2014   HGB 13.9  10/06/2015   HCT 41.0 10/06/2015   PLT 207 10/06/2014   GLUCOSE 202 (H) 03/16/2016   CHOL 212 09/19/2007   TRIG 202 09/19/2007   LDLDIRECT 137 09/19/2007   ALT 21 10/06/2014   AST 17 10/06/2014   NA 140 03/16/2016   K 4.3 03/16/2016   CL 101 03/16/2016   CREATININE 0.99 03/16/2016   BUN 11 03/16/2016   CO2 31 03/16/2016   TSH 1.42 08/03/2014   PSA 0.78 03/16/2016   HGBA1C 9.6 (H) 06/08/2016   MICROALBUR 1.0 07/11/2016    Dg Esophagus Dilatation  Result Date: 10/06/2015 CLINICAL DATA:  ESOPHAGEAL DILATATION Fluoroscopy was provided for use by the requesting physician.  No images were obtained for radiographic interpretation.   Dg Esophagus W/water Sol Cm  Result Date: 10/06/2015 CLINICAL DATA:  Status  post esophageal dilatation. EXAM: ESOPHOGRAM/BARIUM SWALLOW TECHNIQUE: Single contrast examination was performed using water soluble contrast. FLUOROSCOPY TIME:  Radiation Exposure Index (as provided by the fluoroscopic device): If the device does not provide the exposure index: Fluoroscopy Time:  0 minutes and 58 seconds. Number of Acquired Images: COMPARISON:  CT scan 09/01/2015. FINDINGS: There is marked tortuosity and redundancy of the distal esophagus which is also mildly dilated. No extravasation of contrast to suggest a perforation/leak. Esophageal dysmotility noted. IMPRESSION: Markedly tortuous and redundant distal esophagus. No extravasation/ leak of oral contrast. Electronically Signed   By: Marijo Sanes M.D.   On: 10/06/2015 15:02    Assessment & Plan:   Glenn Lyons was seen today for leg swelling.  Diagnoses and all orders for this visit:  Bilateral leg edema -     Basic metabolic panel; Future -     potassium chloride (K-DUR) 10 MEQ tablet; Take 1 tablet (10 mEq total) by mouth daily. -     furosemide (LASIX) 20 MG tablet; Take 1-2 tablets (20-40 mg total) by mouth daily as needed. -     cephALEXin (KEFLEX) 500 MG capsule; Take 1 capsule (500 mg total) by mouth 4 (four) times daily.   I have changed Glenn Lyons's cephALEXin. I am also having him start on potassium chloride. Additionally, I am having him maintain his aspirin, multivitamin with minerals, loratadine, acetaminophen, tadalafil, ciclopirox, metFORMIN, ONETOUCH VERIO, onetouch ultrasoft, glucose blood, citalopram, cetirizine, and furosemide.  Meds ordered this encounter  Medications  . DISCONTD: cephALEXin (KEFLEX) 500 MG capsule    Sig: Take 500 mg by mouth 4 (four) times daily.  . cetirizine (ZYRTEC) 10 MG tablet    Sig: Take 10 mg by mouth.  . potassium chloride (K-DUR) 10 MEQ tablet    Sig: Take 1 tablet (10 mEq total) by mouth daily.    Dispense:  10 tablet    Refill:  0    Order Specific Question:   Supervising  Provider    Answer:   Cassandria Anger [[id number]]  . furosemide (LASIX) 20 MG tablet    Sig: Take 1-2 tablets (20-40 mg total) by mouth daily as needed.    Dispense:  30 tablet    Refill:  0    Order Specific Question:   Supervising Provider    Answer:   Cassandria Anger [[id number]]  . cephALEXin (KEFLEX) 500 MG capsule    Sig: Take 1 capsule (500 mg total) by mouth 4 (four) times daily.    Dispense:  16 capsule    Refill:  0    Order Specific Question:   Supervising Provider    Answer:   Cassandria Anger [  [id number]]    Follow-up: Return if symptoms worsen or fail to improve.  Wilfred Lacy, NP

## 2016-08-30 ENCOUNTER — Ambulatory Visit: Payer: BC Managed Care – PPO | Admitting: Family Medicine

## 2016-08-31 ENCOUNTER — Ambulatory Visit: Payer: Self-pay | Admitting: Internal Medicine

## 2016-10-19 ENCOUNTER — Encounter: Payer: Self-pay | Admitting: Internal Medicine

## 2016-10-19 ENCOUNTER — Ambulatory Visit (INDEPENDENT_AMBULATORY_CARE_PROVIDER_SITE_OTHER): Payer: BC Managed Care – PPO | Admitting: Internal Medicine

## 2016-10-19 ENCOUNTER — Telehealth: Payer: Self-pay

## 2016-10-19 VITALS — BP 130/66 | HR 76 | Ht 72.0 in | Wt 383.0 lb

## 2016-10-19 DIAGNOSIS — I495 Sick sinus syndrome: Secondary | ICD-10-CM

## 2016-10-19 DIAGNOSIS — Z950 Presence of cardiac pacemaker: Secondary | ICD-10-CM

## 2016-10-19 DIAGNOSIS — Z79899 Other long term (current) drug therapy: Secondary | ICD-10-CM | POA: Diagnosis not present

## 2016-10-19 MED ORDER — TORSEMIDE 20 MG PO TABS: 20.0000 mg | ORAL_TABLET | Freq: Every day | ORAL | 3 refills | Status: DC

## 2016-10-19 MED ORDER — DOXYCYCLINE HYCLATE 100 MG PO TABS: 100.0000 mg | ORAL_TABLET | Freq: Two times a day (BID) | ORAL | 0 refills | Status: DC

## 2016-10-19 NOTE — Telephone Encounter (Signed)
Wife advised. 

## 2016-10-19 NOTE — Telephone Encounter (Signed)
Start doxy, mark the border, get checked while out of town if worse.  I'll be out of town next week, FYI to patient.  Thanks.

## 2016-10-19 NOTE — Progress Notes (Signed)
Patient Care Team: Tonia Ghent, MD as PCP - General   HPI  Glenn Lyons is a 61 y.o. male  Seen in pacemaker followup. This was implanted about 2010    He has sinus node dysfunction and paces 40% of the time.  Exercise tolerance is markedly limited however for by his knees and obesity.  He has obstructive sleep apnea and has reached the limits of his CPAP.  Denies chest pain. He has achalasia and has had recurrent need of esophageal dilatation       Past Medical History:  Diagnosis Date  . Achalasia    with prev eval at [facility].  No intervention as of 2012  . Aspiration pneumonia ([place]) 10/05/2014  . Backache, unspecified   . Cardiac pacemaker St. Jude    ERI 2008  . Depressive disorder, not elsewhere classified   . Esophageal reflux   . Morbid obesity ([place])   . Obstructive sleep apnea    biapap settign 25-22  . Osteoarthrosis, unspecified whether generalized or localized, unspecified site   . Osteoarthrosis, unspecified whether generalized or localized, unspecified site   . Presence of permanent cardiac pacemaker    St. Jude- Dr. Caryl Comes follows -device Check 07-06-14  . Sinus bradycardia /pauses   . Stricture and stenosis of esophagus     Past Surgical History:  Procedure Laterality Date  . BALLOON DILATION N/A 10/06/2015   Procedure: BALLOON DILATION;  Surgeon: Mauri Pole, MD;  Location: [facility];  Service: Endoscopy;  Laterality: N/A;  . COLONOSCOPY W/ POLYPECTOMY  11/01/2010  . COLONOSCOPY WITH PROPOFOL N/A 10/05/2014   Procedure: COLONOSCOPY WITH PROPOFOL;  Surgeon: Gatha Mayer, MD;  Location: [facility];  Service: Endoscopy;  Laterality: N/A;  . ESOPHAGEAL DILATION    . ESOPHAGEAL MANOMETRY N/A 08/23/2015   Procedure: ESOPHAGEAL MANOMETRY (EM);  Surgeon: Mauri Pole, MD;  Location: [facility];  Service: Endoscopy;  Laterality: N/A;  . ESOPHAGOGASTRODUODENOSCOPY N/A 10/09/2014   Procedure: ESOPHAGOGASTRODUODENOSCOPY (EGD);  Surgeon:  Gatha Mayer, MD;  Location: [facility];  Service: Endoscopy;  Laterality: N/A;  . ESOPHAGOGASTRODUODENOSCOPY (EGD) WITH PROPOFOL N/A 08/23/2015   Procedure: ESOPHAGOGASTRODUODENOSCOPY (EGD) WITH PROPOFOL;  Surgeon: Mauri Pole, MD;  Location: [facility];  Service: Endoscopy;  Laterality: N/A;  . ESOPHAGOGASTRODUODENOSCOPY (EGD) WITH PROPOFOL N/A 10/06/2015   Procedure: ESOPHAGOGASTRODUODENOSCOPY (EGD) WITH PROPOFOL;  Surgeon: Mauri Pole, MD;  Location: [facility];  Service: Endoscopy;  Laterality: N/A;  Rigiflex ballon size 47m-35mm size 45 minute proc,need Fluro Gastografin esophagram 2 hrs post EGD   . PACEMAKER INSERTION      Current Outpatient Prescriptions  Medication Sig Dispense Refill  . acetaminophen (TYLENOL) 325 MG tablet Take 2 tablets (650 mg total) by mouth every 6 (six) hours as needed (headache, shoulder pain). 100 tablet 1  . aspirin 81 MG tablet Take 81 mg by mouth daily.      . Blood Glucose Monitoring Suppl (ONETOUCH VERIO) w/Device KIT Inject 1 kit into the skin every morning. Use to test blood sugar each day before breakfast and 2 hours after a meal for 2 weeks.  Diagnosis:  E11.9  Non-insulin dependent. 1 kit 0  . cetirizine (ZYRTEC) 10 MG tablet Take 10 mg by mouth.    . ciclopirox (PENLAC) 8 % solution APPLY DAILY AT BEDTIME ON SKIN & NAIL OVER PREVIOUS COAT.AFTER 7 DAYS REMOVE WITH ALCOHOL & REPEAT  5  . citalopram (CELEXA) 20 MG tablet Take 1 tablet (20 mg total)  by mouth daily. 90 tablet 3  . doxycycline (VIBRA-TABS) 100 MG tablet Take 1 tablet (100 mg total) by mouth 2 (two) times daily. 20 tablet 0  . furosemide (LASIX) 20 MG tablet Take 1-2 tablets (20-40 mg total) by mouth daily as needed. 30 tablet 0  . glucose blood (ONETOUCH VERIO) test strip Use as instructed to check blood sugar once daily or as instructed.  Diagnosis:  E11.9  Non insulin dependent. 100 each 3  . Lancets (ONETOUCH ULTRASOFT) lancets Use as instructed to check blood  sugar once daily or as needed.  Diagnosis:  E11.9  Non insulin dependent. 100 each 3  . loratadine (CLARITIN) 10 MG tablet Take 10 mg by mouth daily.    . metFORMIN (GLUCOPHAGE) 500 MG tablet Take 1 tablet (500 mg total) by mouth 2 (two) times daily with a meal. 180 tablet 3  . Multiple Vitamin (MULTIVITAMIN WITH MINERALS) TABS tablet Take 1 tablet by mouth daily.    . potassium chloride (K-DUR) 10 MEQ tablet Take 1 tablet (10 mEq total) by mouth daily. 10 tablet 0  . tadalafil (CIALIS) 5 MG tablet Take 0.5-1 tablets (2.5-5 mg total) by mouth daily as needed for erectile dysfunction. 30 tablet 5   No current facility-administered medications for this visit.     Allergies  Allergen Reactions  . Morphine Other (See Comments)    REACTION: Heart stopped.    Review of Systems negative except from HPI and PMH  Physical Exam BP 130/66   Pulse 76   Ht 6' (1.829 m)   Wt (!) 383 lb (173.7 kg)   SpO2 96%   BMI 51.94 kg/m  Well developed and nourished in no acute distress HENT normal Neck supple with JVP-flat Carotids brisk and full without bruits Clear Regular rate and rhythm, no murmurs or gallops Abd-soft with active BS without hepatomegaly No Clubbing cyanosis 2+ edema Skin-warm and dry A & Oriented  Grossly normal sensory and motor function   ECG demonstrates atrial pacing at 69 intervals 17/10/40 Left axis -56   Assessment and  Plan  Sinus node dysfunction  Peripheral edema  Pacemaker-St. Jude  The patient's device was interrogated.  The information was reviewed. The device was reprogrammed to inactivate rate response Sleep apnea  Obesity- morbid  HFpEF We will change his furosemide--torsemide as my guess is that there is a significant component of right heart failure.  We will also check an echocardiogram to look a right ventricular function and pulmonary pressures.  We have discussed the importance of weight loss and the increasingly appreciated value of weight  loss reduction surgery (not including LAP-BAND) Givens impact on metabolic improvements. I've given him the website information for the program at [facility].  We spent more than 50% of our >25 min visit in face to face counseling regarding the above]

## 2016-10-19 NOTE — Telephone Encounter (Signed)
pts wife left v/m; pt going out of state today at lunch time with work for a few days and thinks cellulitis has come back and request refill doxycycline while pt out of town. Will schedule appt next week if Dr Damita Dunnings thinks necessary. Mrs Dier request cb. Last seen annual 07/11/16. [facility] [address]

## 2016-10-19 NOTE — Patient Instructions (Addendum)
Medication Instructions:  STOP Lasix  START Torsemide 20 mg daily  Labwork: BMET in 2 weeks   Testing/Procedures: Your physician has requested that you have an echocardiogram. Echocardiography is a painless test that uses sound waves to create images of your heart. It provides your doctor with information about the size and shape of your heart and how well your heart's chambers and valves are working. This procedure takes approximately one hour. There are no restrictions for this procedure.     Follow-Up: Your physician wants you to follow-up in: 6 months in the [facility] and 1 year with Tommye Standard, PA. You will receive a reminder letter in the mail two months in advance. If you don't receive a letter, please call our office to schedule the follow-up appointment.     Any Other Special Instructions Will Be Listed Below (If Applicable).     If you need a refill on your cardiac medications before your next appointment, please call your pharmacy.

## 2016-10-31 ENCOUNTER — Other Ambulatory Visit: Payer: Self-pay | Admitting: Family Medicine

## 2016-11-01 ENCOUNTER — Other Ambulatory Visit (HOSPITAL_COMMUNITY): Payer: Self-pay

## 2016-11-01 ENCOUNTER — Other Ambulatory Visit: Payer: Self-pay

## 2016-11-10 ENCOUNTER — Other Ambulatory Visit (HOSPITAL_COMMUNITY): Payer: Self-pay

## 2016-11-10 ENCOUNTER — Other Ambulatory Visit: Payer: Self-pay

## 2016-11-20 ENCOUNTER — Other Ambulatory Visit: Payer: Self-pay | Admitting: Family Medicine

## 2016-11-24 ENCOUNTER — Other Ambulatory Visit (HOSPITAL_COMMUNITY): Payer: Self-pay

## 2016-11-24 ENCOUNTER — Other Ambulatory Visit: Payer: Self-pay

## 2016-11-28 ENCOUNTER — Telehealth (HOSPITAL_COMMUNITY): Payer: Self-pay | Admitting: Internal Medicine

## 2016-11-28 NOTE — Telephone Encounter (Signed)
Patient cancelled appts on 6/6 and 11/10/16 and he no-showed for the appt on 11/24/16.  He will be removed from the workqueue.

## 2016-12-13 ENCOUNTER — Telehealth: Payer: Self-pay

## 2016-12-13 NOTE — Telephone Encounter (Signed)
Patient's wife notified as instructed by telephone and verbalized understanding. Patient's wife stated that she will have to talk with her husband and will call back and schedule an appointment within the next couple of days. Glenn Lyons stated that her husband wants an appointment Friday, but will call him and see if he can come in sooner and call back.

## 2016-12-13 NOTE — Telephone Encounter (Signed)
Needs OV either here or at [facility].

## 2016-12-13 NOTE — Telephone Encounter (Signed)
Mrs Isenberg (DPR signed) said on 12/10/16 pt noticed redness and swelling in lt leg with feeling of bugs crawling under the skin. No fever. Pt has hx of cellulitis and pt thinks these are the beginning signs of cellulitis and pt request abx to [facility]. Mrs Mcgaughey said pt would have to get off work to be seen and Dr Damita Dunnings has called in abx before. Last seen 06/2016 for CPX.Mrs Pascuzzi request cb after reviewed by Dr Damita Dunnings.

## 2016-12-15 ENCOUNTER — Ambulatory Visit: Payer: Self-pay | Admitting: Family Medicine

## 2016-12-17 ENCOUNTER — Other Ambulatory Visit: Payer: Self-pay | Admitting: Family Medicine

## 2017-02-09 ENCOUNTER — Encounter: Payer: Self-pay | Admitting: Primary Care

## 2017-02-09 ENCOUNTER — Ambulatory Visit (INDEPENDENT_AMBULATORY_CARE_PROVIDER_SITE_OTHER): Payer: BC Managed Care – PPO | Admitting: Primary Care

## 2017-02-09 VITALS — BP 124/68 | HR 61 | Temp 98.1°F | Ht 72.0 in | Wt 382.1 lb

## 2017-02-09 DIAGNOSIS — L03116 Cellulitis of left lower limb: Secondary | ICD-10-CM

## 2017-02-09 MED ORDER — DOXYCYCLINE HYCLATE 100 MG PO TABS: 100.0000 mg | ORAL_TABLET | Freq: Two times a day (BID) | ORAL | 0 refills | Status: DC

## 2017-02-09 NOTE — Patient Instructions (Signed)
Start Doxycycline antibiotic. Take 1 tablet by mouth twice daily for 10 days.  Please discuss your recurrent cellulitis with Dr. Damita Dunnings.  Work on improving your diet by decreasing sweets, fried food, fatty food, processed food. Increase fresh vegetables, fruit, whole grains.  Please notify us if no improvement by Monday next week.  It was a pleasure to see you today!

## 2017-02-09 NOTE — Progress Notes (Signed)
 Subjective:    Patient ID: Glenn Lyons, male    DOB: 08/04/1955, 61 y.o.   MRN: [id number]  HPI  Glenn Lyons is a 61 year old male with a history of Type 2 Diabetes, hypertension, sinus bradycardia with pacemaker, recurrent cellulitis who presents today with a chief complaint of leg erythema. He also reports fatigue, swelling, tightness, tingling to his left lower extremity. These symptoms are exactly the same with his prior episodes of cellulitis.   The erythema is located to the left anterior lower extremity that wraps around circumference of lower leg that he first noticed about 1 week ago. He denies recent wound/trauma/openings to his skin, pain, calf swelling, fevers. He's not done very well with his diet over the last 10 days.   Review of Systems  Constitutional: Negative for fever.  Respiratory: Negative for shortness of breath.   Cardiovascular:       Denies calf swelling.  Skin: Positive for color change.  Neurological: Positive for numbness.       Past Medical History:  Diagnosis Date  . Achalasia    with prev eval at [facility].  No intervention as of 2012  . Aspiration pneumonia (HCC) 10/05/2014  . Backache, unspecified   . Cardiac pacemaker St. Jude    ERI 2008  . Depressive disorder, not elsewhere classified   . Esophageal reflux   . Morbid obesity (HCC)   . Obstructive sleep apnea    biapap settign 25-22  . Osteoarthrosis, unspecified whether generalized or localized, unspecified site   . Osteoarthrosis, unspecified whether generalized or localized, unspecified site   . Presence of permanent cardiac pacemaker    St. Jude- Dr. Klein follows -device Check 07-06-14  . Sinus bradycardia /pauses   . Stricture and stenosis of esophagus      Social History   Social History  . Marital status: Married    Spouse name: N/A  . Number of children: 2  . Years of education: N/A   Occupational History  . 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  [employer]   Social History Main  Topics  . Smoking status: Former Smoker    Packs/day: 1.00    Years: 20.00    Types: Cigarettes    Quit date: 05/29/2004  . Smokeless tobacco: Never Used  . Alcohol use Yes     Comment: rarely  . Drug use: No  . Sexual activity: Yes   Other Topics Concern  . Not on file   Social History Narrative   From [place]   Car dealer   Married    Past Surgical History:  Procedure Laterality Date  . BALLOON DILATION N/A 10/06/2015   Procedure: BALLOON DILATION;  Surgeon: Kavitha Nandigam V, MD;  Location: [facility];  Service: Endoscopy;  Laterality: N/A;  . COLONOSCOPY W/ POLYPECTOMY  11/01/2010  . COLONOSCOPY WITH PROPOFOL N/A 10/05/2014   Procedure: COLONOSCOPY WITH PROPOFOL;  Surgeon: Carl E Gessner, MD;  Location: [facility] ENDOSCOPY;  Service: Endoscopy;  Laterality: N/A;  . ESOPHAGEAL DILATION    . ESOPHAGEAL MANOMETRY N/A 08/23/2015   Procedure: ESOPHAGEAL MANOMETRY (EM);  Surgeon: Kavitha Nandigam V, MD;  Location: [facility];  Service: Endoscopy;  Laterality: N/A;  . ESOPHAGOGASTRODUODENOSCOPY N/A 10/09/2014   Procedure: ESOPHAGOGASTRODUODENOSCOPY (EGD);  Surgeon: Carl E Gessner, MD;  Location: [facility];  Service: Endoscopy;  Laterality: N/A;  . ESOPHAGOGASTRODUODENOSCOPY (EGD) WITH PROPOFOL N/A 08/23/2015   Procedure: ESOPHAGOGASTRODUODENOSCOPY (EGD) WITH PROPOFOL;  Surgeon: Kavitha Nandigam V, MD;    Location: [facility];  Service: Endoscopy;  Laterality: N/A;  . ESOPHAGOGASTRODUODENOSCOPY (EGD) WITH PROPOFOL N/A 10/06/2015   Procedure: ESOPHAGOGASTRODUODENOSCOPY (EGD) WITH PROPOFOL;  Surgeon: Mauri Pole, MD;  Location: [facility];  Service: Endoscopy;  Laterality: N/A;  Rigiflex ballon size 72m-35mm size 45 minute proc,need Fluro Gastografin esophagram 2 hrs post EGD   . PACEMAKER INSERTION      Family History  Problem Relation Age of Onset  . Heart attack Father   . Heart disease Father   . Cancer Father        multiple myeloma  . Hypertension Father   .  Prostate cancer Father        possible dx  . Hypertension Mother   . Dementia Mother   . Colon cancer Neg Hx     Allergies  Allergen Reactions  . Morphine Other (See Comments)    REACTION: Heart stopped.    Current Outpatient Prescriptions on File Prior to Visit  Medication Sig Dispense Refill  . acetaminophen (TYLENOL) 325 MG tablet Take 2 tablets (650 mg total) by mouth every 6 (six) hours as needed (headache, shoulder pain). 100 tablet 1  . aspirin 81 MG tablet Take 81 mg by mouth daily.      . Blood Glucose Monitoring Suppl (ONETOUCH VERIO) w/Device KIT Inject 1 kit into the skin every morning. Use to test blood sugar each day before breakfast and 2 hours after a meal for 2 weeks.  Diagnosis:  E11.9  Non-insulin dependent. 1 kit 0  . cetirizine (ZYRTEC) 10 MG tablet Take 10 mg by mouth.    . ciclopirox (PENLAC) 8 % solution APPLY DAILY AT BEDTIME ON SKIN & NAIL OVER PREVIOUS COAT.AFTER 7 DAYS REMOVE WITH ALCOHOL & REPEAT  5  . citalopram (CELEXA) 20 MG tablet Take 1 tablet (20 mg total) by mouth daily. 90 tablet 3  . Lancets (ONETOUCH ULTRASOFT) lancets USE TO TEST BLOOD SUGAR ONCE DAILY OR AS NEEDED 100 each 3  . loratadine (CLARITIN) 10 MG tablet Take 10 mg by mouth daily.    . metFORMIN (GLUCOPHAGE) 500 MG tablet TAKE 1 TABLET (500 MG TOTAL) BY MOUTH 2 (TWO) TIMES DAILY WITH A MEAL. 180 tablet 1  . Multiple Vitamin (MULTIVITAMIN WITH MINERALS) TABS tablet Take 1 tablet by mouth daily.    .Glory RosebushVERIO test strip USE TO TEST BLOOD SUGAR ONCE DAILY OR AS INSTRUCTED 100 each 3  . potassium chloride (K-DUR) 10 MEQ tablet Take 1 tablet (10 mEq total) by mouth daily. 10 tablet 0  . tadalafil (CIALIS) 5 MG tablet Take 0.5-1 tablets (2.5-5 mg total) by mouth daily as needed for erectile dysfunction. 30 tablet 5  . torsemide (DEMADEX) 20 MG tablet Take 1 tablet (20 mg total) by mouth daily. 180 tablet 3   No current facility-administered medications on file prior to visit.     BP  124/68   Pulse 61   Temp 98.1 F (36.7 C) (Oral)   Ht 6' (1.829 m)   Wt (!) 382 lb 1.9 oz (173.3 kg)   SpO2 96%   BMI 51.82 kg/m    Objective:   Physical Exam  Constitutional: He appears well-nourished. He does not appear ill.  Neck: Neck supple.  Cardiovascular: Normal rate.   Trace pitting edema to bilateral lower extremities  Pulmonary/Chest: Effort normal and breath sounds normal.  Skin: Skin is warm and dry. There is erythema.  Erythema to bilateral lower extremities, left side more pronounced than left. Warm to  touch. No open wounds.          Assessment & Plan:  Erythema:  To left lower extremity x 1 week, now worse. Exam today with evidence of PVD to bilateral lower extremities, but left is more representative of an early cellulitis. Do not suspect DVT. This patient seems to be a good historian and given his medical history it would be very reasonable to treat him for presumed cellulitis. Rx for Doxycycline course sent to pharmacy. Discussed to monitor symptoms, gain better control over his diet, follow up early next week if no improvement.  Sheral Flow, NP

## 2017-03-07 ENCOUNTER — Telehealth: Payer: Self-pay | Admitting: Internal Medicine

## 2017-03-07 DIAGNOSIS — R001 Bradycardia, unspecified: Secondary | ICD-10-CM

## 2017-03-07 DIAGNOSIS — Z950 Presence of cardiac pacemaker: Secondary | ICD-10-CM

## 2017-03-07 NOTE — Telephone Encounter (Signed)
We will change his furosemide--torsemide as my guess is that there is a significant component of right heart failure.  We will also check an echocardiogram to look a right ventricular function and pulmonary pressures.  We have discussed the importance of weight loss and the increasingly appreciated value of weight loss reduction surgery (not including LAP-BAND) Givens impact on metabolic improvements. I've given him the website information for the program at [facility].  We spent more than 50% of our >25 min visit in face to face counseling regarding the above]    As indicated above from Dr Olin Pia last [place] with the pt, he wanted him to be scheduled for an echo, to assess the need above.  Pt was originally scheduled to have an echo done back in June, but unfortunately he had to cancel due to work conflict.  Pt is calling in now to reschedule this test.  Pt was informed that their would need to be a new order placed to have his echo scheduled.  Order for echo placed.  Informed the pt and wife that someone from our scheduling dept will call them back in the near future, to reschedule his echo.  Both parties verbalized understanding and agrees with this plan.  Will also route this message to Dr Caryl Comes as a Juluis Rainier.

## 2017-03-07 NOTE — Telephone Encounter (Signed)
New message  Arbie Cookey verbalized that she is calling for the rn   Pt needs az new order for echo and he would like to come in next week

## 2017-03-11 NOTE — Telephone Encounter (Signed)
thnks

## 2017-03-12 ENCOUNTER — Other Ambulatory Visit: Payer: Self-pay | Admitting: *Deleted

## 2017-03-12 ENCOUNTER — Other Ambulatory Visit: Payer: Self-pay | Admitting: Internal Medicine

## 2017-03-12 MED ORDER — CITALOPRAM HYDROBROMIDE 20 MG PO TABS: 20.0000 mg | ORAL_TABLET | Freq: Every day | ORAL | 3 refills | Status: DC

## 2017-03-12 MED ORDER — GLUCOSE BLOOD VI STRP: ORAL_STRIP | 3 refills | Status: DC

## 2017-03-12 MED ORDER — TORSEMIDE 20 MG PO TABS: 20.0000 mg | ORAL_TABLET | Freq: Every day | ORAL | 2 refills | Status: DC

## 2017-03-12 MED ORDER — METFORMIN HCL 500 MG PO TABS: 500.0000 mg | ORAL_TABLET | Freq: Two times a day (BID) | ORAL | 1 refills | Status: DC

## 2017-03-12 MED ORDER — ONETOUCH ULTRASOFT LANCETS MISC: 3 refills | Status: AC

## 2017-03-13 ENCOUNTER — Other Ambulatory Visit: Payer: Self-pay

## 2017-03-13 ENCOUNTER — Ambulatory Visit (HOSPITAL_COMMUNITY): Payer: BC Managed Care – PPO | Attending: Cardiovascular Disease

## 2017-03-13 DIAGNOSIS — Z950 Presence of cardiac pacemaker: Secondary | ICD-10-CM

## 2017-03-13 DIAGNOSIS — Z6841 Body Mass Index (BMI) 40.0 and over, adult: Secondary | ICD-10-CM | POA: Insufficient documentation

## 2017-03-13 DIAGNOSIS — R001 Bradycardia, unspecified: Secondary | ICD-10-CM | POA: Diagnosis not present

## 2017-03-13 MED ORDER — PERFLUTREN LIPID MICROSPHERE
1.0000 mL | INTRAVENOUS | Status: AC | PRN
  Administered 2017-03-13: 3 mL via INTRAVENOUS

## 2017-03-19 ENCOUNTER — Telehealth: Payer: Self-pay

## 2017-03-19 NOTE — Telephone Encounter (Signed)
Pt is returning call back.

## 2017-03-19 NOTE — Telephone Encounter (Signed)
lmtcb for echo results.

## 2017-03-19 NOTE — Telephone Encounter (Signed)
Pt is aware and agreeable to echo results. I have made him aware that he needs to follow up with his PCP about diuresis to help with dyspnea although his weight is likely the reason he is having the difficulty breathing. He is aware and says he's been battling his weight for a long time. He is currently on low dose torsemide at 20 mg QD. I will fax copy of echo results to PCP with recommendations. We are both agreeable to plan.

## 2017-03-21 ENCOUNTER — Ambulatory Visit (INDEPENDENT_AMBULATORY_CARE_PROVIDER_SITE_OTHER): Payer: BC Managed Care – PPO

## 2017-03-21 ENCOUNTER — Ambulatory Visit: Payer: Self-pay

## 2017-03-21 DIAGNOSIS — Z23 Encounter for immunization: Secondary | ICD-10-CM

## 2017-03-29 ENCOUNTER — Encounter: Payer: Self-pay | Admitting: Family Medicine

## 2017-03-30 ENCOUNTER — Encounter: Payer: Self-pay | Admitting: Family Medicine

## 2017-04-01 ENCOUNTER — Other Ambulatory Visit: Payer: Self-pay | Admitting: Family Medicine

## 2017-04-23 ENCOUNTER — Ambulatory Visit (INDEPENDENT_AMBULATORY_CARE_PROVIDER_SITE_OTHER): Payer: BC Managed Care – PPO | Admitting: *Deleted

## 2017-04-23 DIAGNOSIS — R001 Bradycardia, unspecified: Secondary | ICD-10-CM | POA: Diagnosis not present

## 2017-04-23 LAB — CUP PACEART INCLINIC DEVICE CHECK
Battery Impedance: 4600 Ohm
Battery Voltage: 2.75 V
Date Time Interrogation Session: 20181126092558
Implantable Lead Implant Date: 20061219
Implantable Lead Implant Date: 20061219
Implantable Lead Location: 753859
Implantable Lead Location: 753860
Implantable Pulse Generator Implant Date: 20061219
Lead Channel Impedance Value: 328 Ohm
Lead Channel Impedance Value: 868 Ohm
Lead Channel Pacing Threshold Amplitude: 0.75 V
Lead Channel Pacing Threshold Amplitude: 1.25 V
Lead Channel Pacing Threshold Pulse Width: 0.4 ms
Lead Channel Pacing Threshold Pulse Width: 0.4 ms
Lead Channel Sensing Intrinsic Amplitude: 10.1 mV
Lead Channel Sensing Intrinsic Amplitude: 5 mV
Lead Channel Setting Pacing Amplitude: 2.5 V
Lead Channel Setting Pacing Pulse Width: 0.5 ms
Lead Channel Setting Sensing Sensitivity: 2.5 mV
Pulse Gen Model: 5816
Pulse Gen Serial Number: 1622388

## 2017-04-23 NOTE — Progress Notes (Signed)
Pacemaker check in clinic. Normal device function. Thresholds, sensing, impedances consistent with previous measurements. Device programmed to maximize longevity. Episode Triggers are off. Device programmed at appropriate safety margins. Histogram distribution appropriate for patient activity level. Device programmed to optimize intrinsic conduction. Estimated longevity 2.25-3.5 years. Patient education completed. ROV with RU 09/2017.

## 2017-05-03 ENCOUNTER — Other Ambulatory Visit (INDEPENDENT_AMBULATORY_CARE_PROVIDER_SITE_OTHER): Payer: BC Managed Care – PPO

## 2017-05-03 ENCOUNTER — Ambulatory Visit: Payer: BC Managed Care – PPO | Admitting: Family Medicine

## 2017-05-03 DIAGNOSIS — E119 Type 2 diabetes mellitus without complications: Secondary | ICD-10-CM | POA: Diagnosis not present

## 2017-05-03 DIAGNOSIS — R600 Localized edema: Secondary | ICD-10-CM

## 2017-05-03 DIAGNOSIS — Z119 Encounter for screening for infectious and parasitic diseases, unspecified: Secondary | ICD-10-CM

## 2017-05-03 LAB — BASIC METABOLIC PANEL
BUN: 17 mg/dL (ref 6–23)
CO2: 29 mEq/L (ref 19–32)
Calcium: 9.4 mg/dL (ref 8.4–10.5)
Chloride: 101 mEq/L (ref 96–112)
Creatinine, Ser: 1.08 mg/dL (ref 0.40–1.50)
GFR: 74.17 mL/min (ref >60.00)
Glucose, Bld: 208 mg/dL — ABNORMAL HIGH (ref 70–99)
Potassium: 4.4 mEq/L (ref 3.5–5.1)
Sodium: 139 mEq/L (ref 135–145)

## 2017-05-03 LAB — HEMOGLOBIN A1C: Hgb A1c MFr Bld: 9 % — ABNORMAL HIGH (ref 4.6–6.5)

## 2017-05-03 NOTE — Addendum Note (Signed)
Addended by: Ellamae Sia on: 05/03/2017 09:40 AM   Modules accepted: Orders

## 2017-05-04 LAB — HEPATITIS C ANTIBODY
Hepatitis C Ab: NONREACTIVE
SIGNAL TO CUT-OFF: 0.02 (ref <1.00)

## 2017-05-08 ENCOUNTER — Ambulatory Visit: Payer: BC Managed Care – PPO | Admitting: Family Medicine

## 2017-05-15 ENCOUNTER — Ambulatory Visit: Payer: BC Managed Care – PPO | Admitting: Family Medicine

## 2017-05-15 ENCOUNTER — Encounter: Payer: Self-pay | Admitting: Family Medicine

## 2017-05-15 DIAGNOSIS — L0390 Cellulitis, unspecified: Secondary | ICD-10-CM | POA: Diagnosis not present

## 2017-05-15 DIAGNOSIS — E119 Type 2 diabetes mellitus without complications: Secondary | ICD-10-CM | POA: Diagnosis not present

## 2017-05-15 MED ORDER — GLIPIZIDE 5 MG PO TABS: 5.0000 mg | ORAL_TABLET | Freq: Every day | ORAL | 1 refills | Status: DC

## 2017-05-15 MED ORDER — METFORMIN HCL 500 MG PO TABS: 500.0000 mg | ORAL_TABLET | Freq: Two times a day (BID) | ORAL | Status: DC

## 2017-05-15 MED ORDER — CEPHALEXIN 500 MG PO CAPS: 500.0000 mg | ORAL_CAPSULE | Freq: Four times a day (QID) | ORAL | 0 refills | Status: DC

## 2017-05-15 NOTE — Patient Instructions (Addendum)
Call about an eye exam re: diabetes.   Don't skip meals.  Call about a seminar with [facility] [place] [facility] re: bariatric surgery.   Recheck A1c in about 3 months prior to a visit.  Add on glipizide in the AM.   Take care.  Glad to see you.

## 2017-05-15 NOTE — Progress Notes (Signed)
Diabetes:  Using medications without difficulties: back on metformin, d/w pt.  Hypoglycemic episodes: no sx unless prolonged fasting.   Hyperglycemic episodes: no sx Feet problems: see below.  Blood Sugars averaging: usually >200 when checked in the AM eye exam within last year: due, d/w pt.   He has been working on diet and "cheating" less.  D/w pt.   A1c is still up, above goal, d/w pt.    H/o L lower ext cellulitis in the past.  Now with return of L lower leg swelling and pain and redness and warmth.  He had some leftover doxy and took that for a few a days.  More pain standing, more pain at the end of the day.   D/w pt about compression stockings.  He hasn't been using them much due to knee pain with use.  Encouraged use.    He has had some muscle cramping variably/episodically in the legs.    He isn't travelling for work now.    Meds, vitals, and allergies reviewed.   ROS: Per HPI unless specifically indicated in ROS section   GEN: nad, alert and oriented HEENT: mucous membranes moist NECK: supple w/o LA CV: rrr. PULM: ctab, no inc wob ABD: soft, +bs EXT: no edema SKIN: L lower shin with local redness and warmth and 1+ BLE edema. No fluctuant mass.

## 2017-05-16 DIAGNOSIS — L0390 Cellulitis, unspecified: Secondary | ICD-10-CM | POA: Insufficient documentation

## 2017-05-16 NOTE — Assessment & Plan Note (Signed)
Recurrent, likely multifactorial.  Okay for outpatient follow-up.  Restart Keflex.  Update me as needed.  Needs improved diabetic control, control of edema with elevation of compression stockings, and weight loss.  Discussed with patient.

## 2017-05-16 NOTE — Assessment & Plan Note (Signed)
He has been working on diet and "cheating" less.  D/w pt.  In spite of that, A1c is still up, above goal, d/w pt.   Needs weight loss, diet, exercise.  He is considering bariatric intervention.  Information given regarding [facility] [place] [facility], discussed about going for information seminar. Recheck A1c in about 3 months prior to a visit.  Add on glipizide in the AM.   Routine cautions given.  See after visit summary.

## 2017-06-05 ENCOUNTER — Ambulatory Visit: Payer: BC Managed Care – PPO | Admitting: Family Medicine

## 2017-06-05 ENCOUNTER — Ambulatory Visit: Payer: BC Managed Care – PPO | Admitting: Nurse Practitioner

## 2017-06-05 ENCOUNTER — Encounter: Payer: Self-pay | Admitting: Nurse Practitioner

## 2017-06-05 ENCOUNTER — Telehealth: Payer: Self-pay | Admitting: *Deleted

## 2017-06-05 VITALS — BP 120/76 | HR 60 | Temp 98.1°F | Ht 72.0 in | Wt 366.0 lb

## 2017-06-05 DIAGNOSIS — J0100 Acute maxillary sinusitis, unspecified: Secondary | ICD-10-CM

## 2017-06-05 DIAGNOSIS — M25562 Pain in left knee: Secondary | ICD-10-CM

## 2017-06-05 MED ORDER — DOXYCYCLINE HYCLATE 100 MG PO TABS: 100.0000 mg | ORAL_TABLET | Freq: Two times a day (BID) | ORAL | 0 refills | Status: AC

## 2017-06-05 MED ORDER — SACCHAROMYCES BOULARDII 250 MG PO CAPS: 250.0000 mg | ORAL_CAPSULE | Freq: Two times a day (BID) | ORAL | Status: DC

## 2017-06-05 MED ORDER — SALINE SPRAY 0.65 % NA SOLN: 1.0000 | NASAL | 0 refills | Status: DC | PRN

## 2017-06-05 MED ORDER — OXYMETAZOLINE HCL 0.05 % NA SOLN: 1.0000 | Freq: Two times a day (BID) | NASAL | 0 refills | Status: DC

## 2017-06-05 MED ORDER — FLUTICASONE PROPIONATE 50 MCG/ACT NA SUSP: 2.0000 | Freq: Every day | NASAL | 0 refills | Status: DC

## 2017-06-05 NOTE — Telephone Encounter (Signed)
Glenn Lyons patient

## 2017-06-05 NOTE — Progress Notes (Signed)
Subjective:  Patient ID: Glenn Lyons, male    DOB: 09-19-1955  Age: 62 y.o. MRN: [id number]  CC: Nasal Congestion (chest congestion,ear blug up/coughing/--going on for 2 wks) and Leg Pain (left leg pain--DM pt--upper calf pain? sit alot at work?)   Leg Pain   Incident onset: onset >65month. There was no injury mechanism. The pain is present in the right leg and right knee. The quality of the pain is described as aching and shooting. The pain has been fluctuating since onset. Associated symptoms include an inability to bear weight. Pertinent negatives include no numbness or tingling. The symptoms are aggravated by weight bearing and movement. He has tried rest for the symptoms.  Sinusitis  This is a new problem. The current episode started 1 to 4 weeks ago. The problem has been waxing and waning since onset. There has been no fever. Associated symptoms include congestion, coughing, ear pain, sinus pressure and a sore throat. Past treatments include oral decongestants and acetaminophen. The treatment provided no relief.   Outpatient Medications Prior to Visit  Medication Sig Dispense Refill  . acetaminophen (TYLENOL) 325 MG tablet Take 2 tablets (650 mg total) by mouth every 6 (six) hours as needed (headache, shoulder pain). 100 tablet 1  . aspirin 81 MG tablet Take 81 mg by mouth daily.      . Blood Glucose Monitoring Suppl (ONETOUCH VERIO) w/Device KIT Inject 1 kit into the skin every morning. Use to test blood sugar each day before breakfast and 2 hours after a meal for 2 weeks.  Diagnosis:  E11.9  Non-insulin dependent. 1 kit 0  . cetirizine (ZYRTEC) 10 MG tablet Take 10 mg by mouth.    . ciclopirox (PENLAC) 8 % solution APPLY DAILY AT BEDTIME ON SKIN & NAIL OVER PREVIOUS COAT.AFTER 7 DAYS REMOVE WITH ALCOHOL & REPEAT  5  . citalopram (CELEXA) 20 MG tablet Take 1 tablet (20 mg total) by mouth daily. 90 tablet 3  . glipiZIDE (GLUCOTROL) 5 MG tablet Take 1 tablet (5 mg total) by mouth daily  before breakfast. 90 tablet 1  . glucose blood (ONETOUCH VERIO) test strip USE TO TEST BLOOD SUGAR ONCE DAILY OR AS INSTRUCTED 100 each 3  . Lancets (ONETOUCH ULTRASOFT) lancets USE TO TEST BLOOD SUGAR ONCE DAILY OR AS NEEDED 100 each 3  . loratadine (CLARITIN) 10 MG tablet Take 10 mg by mouth daily.    . metFORMIN (GLUCOPHAGE) 500 MG tablet Take 1 tablet (500 mg total) by mouth 2 (two) times daily with a meal.    . Multiple Vitamin (MULTIVITAMIN WITH MINERALS) TABS tablet Take 1 tablet by mouth daily.    .Marland Kitchentorsemide (DEMADEX) 20 MG tablet Take 1 tablet (20 mg total) by mouth daily. 180 tablet 2  . cephALEXin (KEFLEX) 500 MG capsule Take 1 capsule (500 mg total) by mouth 4 (four) times daily. (Patient not taking: Reported on 06/05/2017) 40 capsule 0  . tadalafil (CIALIS) 5 MG tablet Take 0.5-1 tablets (2.5-5 mg total) by mouth daily as needed for erectile dysfunction. (Patient not taking: Reported on 06/05/2017) 30 tablet 5   No facility-administered medications prior to visit.     ROS See HPI  Objective:  BP 120/76   Pulse 60   Temp 98.1 F (36.7 C) (Oral)   Ht 6' (1.829 m)   Wt (!) 366 lb (166 kg)   SpO2 94%   BMI 49.64 kg/m   BP Readings from Last 3 Encounters:  06/05/17 120/76  05/15/17 (Marland Kitchen  142/72  02/09/17 124/68    Wt Readings from Last 3 Encounters:  06/05/17 (!) 366 lb (166 kg)  05/15/17 (!) 387 lb 8 oz (175.8 kg)  02/09/17 (!) 382 lb 1.9 oz (173.3 kg)    Physical Exam  Constitutional: He is oriented to person, place, and time. No distress.  HENT:  Right Ear: Tympanic membrane, external ear and ear canal normal.  Left Ear: Tympanic membrane and ear canal normal.  Nose: Mucosal edema and rhinorrhea present. Right sinus exhibits maxillary sinus tenderness and frontal sinus tenderness. Left sinus exhibits maxillary sinus tenderness and frontal sinus tenderness.  Mouth/Throat: Uvula is midline. Posterior oropharyngeal erythema present. No oropharyngeal exudate.  Eyes: No  scleral icterus.  Neck: Normal range of motion. Neck supple.  Cardiovascular: Normal rate and regular rhythm.  Pulmonary/Chest: Effort normal and breath sounds normal.  Musculoskeletal: He exhibits no edema, tenderness or deformity.       Left knee: Normal.       Left ankle: Normal.       Left lower leg: Normal.  Lymphadenopathy:    He has no cervical adenopathy.  Neurological: He is alert and oriented to person, place, and time.  Vitals reviewed.   Lab Results  Component Value Date   WBC 18.8 (H) 10/06/2014   HGB 13.9 10/06/2015   HCT 41.0 10/06/2015   PLT 207 10/06/2014   GLUCOSE 208 (H) 05/03/2017   CHOL 212 09/19/2007   TRIG 202 09/19/2007   LDLDIRECT 137 09/19/2007   ALT 21 10/06/2014   AST 17 10/06/2014   NA 139 05/03/2017   K 4.4 05/03/2017   CL 101 05/03/2017   CREATININE 1.08 05/03/2017   BUN 17 05/03/2017   CO2 29 05/03/2017   TSH 1.42 08/03/2014   PSA 0.78 03/16/2016   HGBA1C 9.0 (H) 05/03/2017   MICROALBUR 1.0 07/11/2016    Dg Esophagus Dilatation  Result Date: 10/06/2015 CLINICAL DATA:  ESOPHAGEAL DILATATION Fluoroscopy was provided for use by the requesting physician.  No images were obtained for radiographic interpretation.   Dg Esophagus W/water Sol Cm  Result Date: 10/06/2015 CLINICAL DATA:  Status post esophageal dilatation. EXAM: ESOPHOGRAM/BARIUM SWALLOW TECHNIQUE: Single contrast examination was performed using water soluble contrast. FLUOROSCOPY TIME:  Radiation Exposure Index (as provided by the fluoroscopic device): If the device does not provide the exposure index: Fluoroscopy Time:  0 minutes and 58 seconds. Number of Acquired Images: COMPARISON:  CT scan 09/01/2015. FINDINGS: There is marked tortuosity and redundancy of the distal esophagus which is also mildly dilated. No extravasation of contrast to suggest a perforation/leak. Esophageal dysmotility noted. IMPRESSION: Markedly tortuous and redundant distal esophagus. No extravasation/ leak of  oral contrast. Electronically Signed   By: Marijo Sanes M.D.   On: 10/06/2015 15:02    Assessment & Plan:   Glenn Lyons was seen today for nasal congestion and leg pain.  Diagnoses and all orders for this visit:  Posterior left knee pain -     Ambulatory referral to Sports Medicine  Acute non-recurrent maxillary sinusitis -     fluticasone (FLONASE) 50 MCG/ACT nasal spray; Place 2 sprays into both nostrils daily. -     oxymetazoline (AFRIN NASAL SPRAY) 0.05 % nasal spray; Place 1 spray into both nostrils 2 (two) times daily. Use only for 3days, then stop -     sodium chloride (OCEAN) 0.65 % SOLN nasal spray; Place 1 spray into both nostrils as needed for congestion. -     saccharomyces boulardii (FLORASTOR) 250 MG  capsule; Take 1 capsule (250 mg total) by mouth 2 (two) times daily. -     doxycycline (VIBRA-TABS) 100 MG tablet; Take 1 tablet (100 mg total) by mouth 2 (two) times daily for 7 days.   I am having Jawanza Ambs start on fluticasone, oxymetazoline, sodium chloride, saccharomyces boulardii, and doxycycline. I am also having him maintain his aspirin, multivitamin with minerals, loratadine, acetaminophen, tadalafil, ciclopirox, ONETOUCH VERIO, cetirizine, torsemide, citalopram, glucose blood, onetouch ultrasoft, metFORMIN, cephALEXin, and glipiZIDE.  Meds ordered this encounter  Medications  . fluticasone (FLONASE) 50 MCG/ACT nasal spray    Sig: Place 2 sprays into both nostrils daily.    Dispense:  16 g    Refill:  0    Order Specific Question:   Supervising Provider    Answer:   Lucille Passy [[id number]]  . oxymetazoline (AFRIN NASAL SPRAY) 0.05 % nasal spray    Sig: Place 1 spray into both nostrils 2 (two) times daily. Use only for 3days, then stop    Dispense:  30 mL    Refill:  0    Order Specific Question:   Supervising Provider    Answer:   Lucille Passy [[id number]]  . sodium chloride (OCEAN) 0.65 % SOLN nasal spray    Sig: Place 1 spray into both nostrils as needed for  congestion.    Dispense:  15 mL    Refill:  0    Order Specific Question:   Supervising Provider    Answer:   Lucille Passy [[id number]]  . saccharomyces boulardii (FLORASTOR) 250 MG capsule    Sig: Take 1 capsule (250 mg total) by mouth 2 (two) times daily.    Order Specific Question:   Supervising Provider    Answer:   Lucille Passy [[id number]]  . doxycycline (VIBRA-TABS) 100 MG tablet    Sig: Take 1 tablet (100 mg total) by mouth 2 (two) times daily for 7 days.    Dispense:  14 tablet    Refill:  0    Order Specific Question:   Supervising Provider    Answer:   Lucille Passy [[id number]]   Follow-up: Return if symptoms worsen or fail to improve.  Wilfred Lacy, NP

## 2017-06-05 NOTE — Telephone Encounter (Signed)
Copied from [place] [id number]. Topic: Quick Communication - Appointment Cancellation >> Jun 05, 2017  8:38 AM Synthia Innocent wrote: Patient called to cancel appointment scheduled for 06/05/17. Patient has rescheduled their appointment. Wife is scheduled at [facility] and would rather go there since he is taking her there    Route to department's PEC pool.  Do you want late cancellation fee charged?

## 2017-06-05 NOTE — Patient Instructions (Signed)
URI Instructions: Flonase and Afrin use: apply 1spray of afrin in each nare, wait 91mins, then apply 2sprays of flonase in each nare. Use both nasal spray consecutively x 3days, then flonase only for at least 14days.  Encourage adequate oral hydration.  Make appt at front desk with dr. Raeford Razor.

## 2017-06-05 NOTE — Telephone Encounter (Signed)
Would not charge. Thanks.

## 2017-06-08 ENCOUNTER — Ambulatory Visit: Payer: BC Managed Care – PPO | Admitting: Family Medicine

## 2017-06-11 ENCOUNTER — Ambulatory Visit: Payer: BC Managed Care – PPO | Admitting: Family Medicine

## 2017-06-12 ENCOUNTER — Telehealth: Payer: Self-pay | Admitting: Family Medicine

## 2017-06-12 ENCOUNTER — Ambulatory Visit: Payer: BC Managed Care – PPO | Admitting: Family Medicine

## 2017-06-12 ENCOUNTER — Ambulatory Visit (INDEPENDENT_AMBULATORY_CARE_PROVIDER_SITE_OTHER)
Admission: RE | Admit: 2017-06-12 | Discharge: 2017-06-12 | Disposition: A | Discharge status: Home / Self Care | Payer: BC Managed Care – PPO | Source: Ambulatory Visit | Attending: Family Medicine | Admitting: Family Medicine

## 2017-06-12 ENCOUNTER — Encounter: Payer: Self-pay | Admitting: Family Medicine

## 2017-06-12 VITALS — BP 132/74 | HR 72 | Temp 97.5°F | Ht 72.0 in | Wt 381.0 lb

## 2017-06-12 DIAGNOSIS — S86112A Strain of other muscle(s) and tendon(s) of posterior muscle group at lower leg level, left leg, initial encounter: Secondary | ICD-10-CM

## 2017-06-12 DIAGNOSIS — M25562 Pain in left knee: Secondary | ICD-10-CM

## 2017-06-12 MED ORDER — DICLOFENAC SODIUM 2 % TD SOLN: 1.0000 "application " | Freq: Two times a day (BID) | TRANSDERMAL | 3 refills | Status: DC

## 2017-06-12 NOTE — Telephone Encounter (Signed)
Resent pennsaid to Pharmacy:  [id number] - [place], [place]. Thanks.

## 2017-06-12 NOTE — Assessment & Plan Note (Signed)
Having pain on the posterior aspect of his knee. Reports this is most of his pain and feels this gives way when he begins to walk.  - counseled on HEP  - if no improvement consider PT

## 2017-06-12 NOTE — Telephone Encounter (Signed)
Called and informed patient of his xray results. Will try a DonJoy medial unloader brace.   Rosemarie Ax, MD [facility] Medicine 06/12/2017, 2:23 PM

## 2017-06-12 NOTE — Telephone Encounter (Unsigned)
Copied from [place]. Topic: Quick Communication - See Telephone Encounter >> Jun 12, 2017 10:44 AM Hewitt Shorts wrote: CRM for notification. See Telephone encounter for: pt was seen today and the medications that were sent to the pharmacy was sent to the wrong pharmacy -pt only uses the [place] when traveling and he is local so he needs them sent to [place]  Not sure of the names of meds wife called this in and pt was not with her  Best number [phone]  06/12/17.

## 2017-06-12 NOTE — Progress Notes (Signed)
Glenn Lyons - 62 y.o. male MRN [id number]  Date of birth: 24-Mar-1956  SUBJECTIVE:  Including CC & ROS.  Chief Complaint  Patient presents with  . left knee pain    Glenn Lyons Left is a 62 y.o. male that is presenting with left knee pain. Pain has been increasing.      previously tore  mensicus 2013, was unable to MRI due to  Having a pacemaker. Pain is described as throbbing pain, located behind his left knee.  Pain is worse when standing feels like it needs to pop. Denies surgery. Denies tingling or numbness.  He has been taking motrin and applying Aspercreme with no improvement. He sits throughout the day at work, states it feels better when he standing. Ambulating increases the pain. He is currently taking doxycyline for cellulitis on his lower leg. This pain started about 1 month ago. He denies any bruising.    Review of Systems  Constitutional: Negative for fever.  HENT: Negative for trouble swallowing.   Respiratory: Negative for shortness of breath.   Cardiovascular: Negative for chest pain.  Gastrointestinal: Negative for abdominal pain.  Musculoskeletal: Positive for gait problem.  Skin: Negative for color change.  Neurological: Negative for weakness.  Hematological: Negative for adenopathy.    HISTORY: Past Medical, Surgical, Social, and Family History Reviewed & Updated per EMR.   Pertinent Historical Findings include:  Past Medical History:  Diagnosis Date  . Achalasia    with prev eval at [facility]  No intervention as of 2012  . Aspiration pneumonia ([place]) 10/05/2014  . Backache, unspecified   . Cardiac pacemaker St. Jude    ERI 2008  . Depressive disorder, not elsewhere classified   . Esophageal reflux   . Morbid obesity ([place])   . Obstructive sleep apnea    biapap settign 25-22  . Osteoarthrosis, unspecified whether generalized or localized, unspecified site   . Osteoarthrosis, unspecified whether generalized or localized, unspecified site   . Presence of permanent  cardiac pacemaker    St. Jude- Dr. Caryl Comes follows -device Check 07-06-14  . Sinus bradycardia /pauses   . Stricture and stenosis of esophagus     Past Surgical History:  Procedure Laterality Date  . BALLOON DILATION N/A 10/06/2015   Procedure: BALLOON DILATION;  Surgeon: Mauri Pole, MD;  Location: [facility];  Service: Endoscopy;  Laterality: N/A;  . COLONOSCOPY W/ POLYPECTOMY  11/01/2010  . COLONOSCOPY WITH PROPOFOL N/A 10/05/2014   Procedure: COLONOSCOPY WITH PROPOFOL;  Surgeon: Gatha Mayer, MD;  Location: [facility];  Service: Endoscopy;  Laterality: N/A;  . ESOPHAGEAL DILATION    . ESOPHAGEAL MANOMETRY N/A 08/23/2015   Procedure: ESOPHAGEAL MANOMETRY (EM);  Surgeon: Mauri Pole, MD;  Location: [facility];  Service: Endoscopy;  Laterality: N/A;  . ESOPHAGOGASTRODUODENOSCOPY N/A 10/09/2014   Procedure: ESOPHAGOGASTRODUODENOSCOPY (EGD);  Surgeon: Gatha Mayer, MD;  Location: [facility];  Service: Endoscopy;  Laterality: N/A;  . ESOPHAGOGASTRODUODENOSCOPY (EGD) WITH PROPOFOL N/A 08/23/2015   Procedure: ESOPHAGOGASTRODUODENOSCOPY (EGD) WITH PROPOFOL;  Surgeon: Mauri Pole, MD;  Location: [facility];  Service: Endoscopy;  Laterality: N/A;  . ESOPHAGOGASTRODUODENOSCOPY (EGD) WITH PROPOFOL N/A 10/06/2015   Procedure: ESOPHAGOGASTRODUODENOSCOPY (EGD) WITH PROPOFOL;  Surgeon: Mauri Pole, MD;  Location: [facility];  Service: Endoscopy;  Laterality: N/A;  Rigiflex ballon size 81m-35mm size 45 minute proc,need Fluro Gastografin esophagram 2 hrs post EGD   . PACEMAKER INSERTION      Allergies  Allergen Reactions  . Morphine Other (See Comments)  REACTION: Heart stopped.    Family History  Problem Relation Age of Onset  . Heart attack Father   . Heart disease Father   . Cancer Father        multiple myeloma  . Hypertension Father   . Prostate cancer Father        possible dx  . Hypertension Mother   . Dementia Mother   . Colon cancer Neg Hx       Social History   Socioeconomic History  . Marital status: Married    Spouse name: Not on file  . Number of children: 2  . Years of education: Not on file  . Highest education level: Not on file  Social Needs  . Financial resource strain: Not on file  . Food insecurity - worry: Not on file  . Food insecurity - inability: Not on file  . Transportation needs - medical: Not on file  . Transportation needs - non-medical: Not on file  Occupational History  . 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 Occupation: singer    Employer: [employer]  Tobacco Use  . Smoking status: Former Smoker    Packs/day: 1.00    Years: 20.00    Pack years: 20.00    Types: Cigarettes    Last attempt to quit: 05/29/2004    Years since quitting: 13.0  . Smokeless tobacco: Never Used  Substance and Sexual Activity  . Alcohol use: Yes    Comment: rarely  . Drug use: No  . Sexual activity: Yes  Other Topics Concern  . Not on file  Social History Narrative   From [place]   Married     PHYSICAL EXAM:  VS: BP 132/74 (BP Location: Left Arm, Patient Position: Sitting, Cuff Size: Normal)   Pulse 72   Temp (!) 97.5 F (36.4 C) (Oral)   Ht 6' (1.829 m)   Wt (!) 381 lb (172.8 kg)   SpO2 98%   BMI 51.67 kg/m  Physical Exam Gen: NAD, alert, cooperative with exam, well-appearing ENT: normal lips, normal nasal mucosa,  Eye: normal EOM, normal conjunctiva and lids CV:  no edema, +2 pedal pulses   Resp: no accessory muscle use, non-labored,  Skin: no rashes, no areas of induration  Neuro: normal tone, normal sensation to touch Psych:  normal insight, alert and oriented MSK:  Left Knee:  No significant tenderness to the lateral dry line. Some tenderness to palpation over the medial joint line. Tenderness to palpation over the lateral proximal gastroc Normal knee range of motion. Normal strength resistance. Some instability with valgus stress. Negative McMurray's test. Slight limp  with ambulation. No pain with patellar grind. Neurovascular intact  Limited ultrasound: left knee:  Mild to moderate effusion  Medial joint space narrowing and outpouching of meniscus  Normal appearing proximal lateral gastroc  Normal appearing vessels   Summary: degenerative changes of the joint line and meniscus. kne effusion.   Ultrasound and interpretation by Clearance Coots, MD         ASSESSMENT & PLAN:   Strain of gastrocnemius muscle of left lower extremity Having pain on the posterior aspect of his knee. Reports this is most of his pain and feels this gives way when he begins to walk.  - counseled on HEP  - if no improvement consider PT   Knee pain, left Has minimal fluid but can occur from a meniscal injury.  - xray  - Pennsaid  - could try a medial  unloader brace  - counseled on weight loss

## 2017-06-12 NOTE — Telephone Encounter (Signed)
Pt called and stated that the pharmacy in [place] called and stated the Rx would be mailed out from [place], pt is wanting to know if that Rx can be cancelled and have it sent to the pharmacy in [place], contact pt or pharmacy to advise

## 2017-06-12 NOTE — Assessment & Plan Note (Signed)
Has minimal fluid but can occur from a meniscal injury.  - xray  - Pennsaid  - could try a medial unloader brace  - counseled on weight loss

## 2017-06-12 NOTE — Patient Instructions (Signed)
We will call you with the results from today  Please try the exercises and medications  If no improvement we could consider injection or physical therapy.

## 2017-06-14 ENCOUNTER — Telehealth: Payer: Self-pay | Admitting: Family Medicine

## 2017-06-14 DIAGNOSIS — M25562 Pain in left knee: Secondary | ICD-10-CM

## 2017-06-14 DIAGNOSIS — S86112A Strain of other muscle(s) and tendon(s) of posterior muscle group at lower leg level, left leg, initial encounter: Secondary | ICD-10-CM

## 2017-06-14 NOTE — Telephone Encounter (Addendum)
See below.  Also a fax received for Pennsaid 2% Pump.  Alternative requested medication not covered by patient's insurance.  Are these the same medications?  Suggested Alternatives:  Diclofenac 1.5% Topical Solution, Klofensaid II 1.5% Topical Solution.

## 2017-06-14 NOTE — Telephone Encounter (Signed)
Copied from [place] [id number]. Topic: Quick Communication - See Telephone Encounter >> Jun 14, 2017 11:01 AM Ether Griffins B wrote: CRM for notification. See Telephone encounter for:  Pt needing prior auth on medication diclofenac sodium. [facility]  06/14/17.

## 2017-06-15 MED ORDER — DICLOFENAC SODIUM 2 % TD SOLN: 1.0000 "application " | Freq: Two times a day (BID) | TRANSDERMAL | 3 refills | Status: DC

## 2017-06-15 NOTE — Telephone Encounter (Signed)
Sending the pennsaid to the [facility] pharmacy which doesn't require the PA. I sent it to the onepoint pharmacy during my encounter. Will try again. If it doesn't work, he doesn't need the medication and try oral NSAID.   Rosemarie Ax, MD [facility] Primary Care & Sports Medicine 06/15/2017, 8:09 AM

## 2017-06-15 NOTE — Telephone Encounter (Signed)
I need input from Dr. Raeford Razor.  I had resent the original rx based on his notes after the pharmacy change/request.  I routed this note with appreciation.

## 2017-06-21 NOTE — Telephone Encounter (Signed)
Left detailed message on voicemail of wife. 

## 2017-06-21 NOTE — Telephone Encounter (Signed)
Patient's spouse Arbie Cookey [phone] is waiting on information about the prescriptions.  Please call her.

## 2017-06-29 ENCOUNTER — Ambulatory Visit: Payer: Self-pay | Admitting: *Deleted

## 2017-06-29 MED ORDER — CEPHALEXIN 500 MG PO CAPS: 500.0000 mg | ORAL_CAPSULE | Freq: Four times a day (QID) | ORAL | 0 refills | Status: DC

## 2017-06-29 NOTE — Addendum Note (Signed)
Addended by: Tonia Ghent on: 06/29/2017 02:01 PM   Modules accepted: Orders

## 2017-06-29 NOTE — Telephone Encounter (Signed)
Left detailed message on voicemail of patient and wife.

## 2017-06-29 NOTE — Telephone Encounter (Signed)
Pt's wife, Arbie Cookey, called because she feels like his cellulitis is coming back; he has leg swelling and pain, left greater than right; swelling goes from knee to ankle bilaterally; she also says that the pt has a brace for his left leg but his leg is is swollen that he can not get the brace on; per nurse triage recommendation made for pt to see physician within 24 hours;  Arbie Cookey says that the pt is at work now and is wondering if Dr Damita Dunnings could send in a prescription for doxycycline or cephelaxin because the pt is unable to come in until Tuesday 07/03/17; please call her back at [phone]; will route to [place] pool for notification of encounter to physician.  Reason for Disposition . [1] MODERATE leg swelling (e.g., swelling extends up to knees) AND [2] new onset or worsening  Answer Assessment - Initial Assessment Questions 1. ONSET: "When did the swelling start?" (e.g., minutes, hours, days)     Days 06/25/17 2. LOCATION: "What part of the leg is swollen?"  "Are both legs swollen or just one leg?"     Both legs left greater than right 3. SEVERITY: "How bad is the swelling?" (e.g., localized; mild, moderate, severe)  - Localized - small area of swelling localized to one leg  - MILD pedal edema - swelling limited to foot and ankle, pitting edema < 1/4 inch (6 mm) deep, rest and elevation eliminate most or all swelling  - MODERATE edema - swelling of lower leg to knee, pitting edema > 1/4 inch (6 mm) deep, rest and elevation only partially reduce swelling  - SEVERE edema - swelling extends above knee, facial or hand swelling present      severe 4. REDNESS: "Does the swelling look red or infected?"  yes 5. PAIN: "Is the swelling painful to touch?" If so, ask: "How painful is it?"   (Scale 1-10; mild, moderate or severe)     Moderate to severe 6. FEVER: "Do you have a fever?" If so, ask: "What is it, how was it measured, and when did it start?"      no 7. CAUSE: "What do you think is  causing the leg swelling?"     cellulitis 8. MEDICAL HISTORY: "Do you have a history of heart failure, kidney disease, liver failure, or cancer?"     Pace maker 9. RECURRENT SYMPTOM: "Have you had leg swelling before?" If so, ask: "When was the last time?" "What happened that time?"     Yes May 15, 2017 10. OTHER SYMPTOMS: "Do you have any other symptoms?" (e.g., chest pain, difficulty breathing)       no 11. PREGNANCY: "Is there any chance you are pregnant?" "When was your last menstrual period?"       n/a  Protocols used: LEG SWELLING AND EDEMA-A-AH

## 2017-06-29 NOTE — Telephone Encounter (Signed)
Start keflex, advise patient go to UC/Sat clinic over the weekend.  Still needs eval.  Thanks.

## 2017-07-02 ENCOUNTER — Encounter: Payer: Self-pay | Admitting: Family Medicine

## 2017-07-02 ENCOUNTER — Ambulatory Visit: Payer: BC Managed Care – PPO | Admitting: Family Medicine

## 2017-07-02 DIAGNOSIS — L0390 Cellulitis, unspecified: Secondary | ICD-10-CM

## 2017-07-02 NOTE — Progress Notes (Signed)
He had sx similar to prev with cellulitis.  He will feel diffusely unwell with episodes, fatigued.  He feels some better in the meantime on abx.    He is working on his diet.  He cut out some sugars from his diet.  Sugar has been ~150-170 recently, has been <100 occ during the day, usually <125 by midday.  Last A1c was 9 in 04/2017.  He cut out bread and rice and candy.    He went to bariatric class.  He is looking at gastric bypass but he has to be able to walk better.  He is going to get set with a knee brace for knee instability.  D/w pt.    Meds, vitals, and allergies reviewed.   ROS: Per HPI unless specifically indicated in ROS section   nad ncat rrr ctab abd soft, not ttp, normal BS.   B but R>L lower ext edema with mild redness but no fluctuant mass.

## 2017-07-02 NOTE — Patient Instructions (Addendum)
Recheck A1c in about 1 month.  Finish the antibiotics.   Keep working on your sugar.  Take care.  Glad to see you.

## 2017-07-03 NOTE — Assessment & Plan Note (Addendum)
Likely related to weight, DM2, etc.  Recheck A1c in about 1 month.  Finish the antibiotics, since improved in the meantime.  Keep working on Art therapist.  He agrees.  See above re: bariatric intervention consideration.

## 2017-07-20 ENCOUNTER — Encounter: Payer: Self-pay | Admitting: Family Medicine

## 2017-07-22 ENCOUNTER — Other Ambulatory Visit: Payer: Self-pay | Admitting: Family Medicine

## 2017-07-22 DIAGNOSIS — E119 Type 2 diabetes mellitus without complications: Secondary | ICD-10-CM

## 2017-07-22 DIAGNOSIS — Z125 Encounter for screening for malignant neoplasm of prostate: Secondary | ICD-10-CM

## 2017-07-31 ENCOUNTER — Encounter: Payer: Self-pay | Admitting: Family Medicine

## 2017-07-31 ENCOUNTER — Ambulatory Visit: Payer: BC Managed Care – PPO | Admitting: Family Medicine

## 2017-07-31 VITALS — BP 132/72 | HR 68 | Temp 97.8°F | Wt 385.5 lb

## 2017-07-31 DIAGNOSIS — Z125 Encounter for screening for malignant neoplasm of prostate: Secondary | ICD-10-CM | POA: Diagnosis not present

## 2017-07-31 DIAGNOSIS — E119 Type 2 diabetes mellitus without complications: Secondary | ICD-10-CM | POA: Diagnosis not present

## 2017-07-31 LAB — COMPREHENSIVE METABOLIC PANEL WITH GFR
ALT: 17 U/L (ref 0–53)
AST: 12 U/L (ref 0–37)
Albumin: 4 g/dL (ref 3.5–5.2)
Alkaline Phosphatase: 85 U/L (ref 39–117)
BUN: 14 mg/dL (ref 6–23)
CO2: 31 meq/L (ref 19–32)
Calcium: 9.9 mg/dL (ref 8.4–10.5)
Chloride: 101 meq/L (ref 96–112)
Creatinine, Ser: 0.89 mg/dL (ref 0.40–1.50)
GFR: 92.65 mL/min
Glucose, Bld: 183 mg/dL — ABNORMAL HIGH (ref 70–99)
Potassium: 4.5 meq/L (ref 3.5–5.1)
Sodium: 136 meq/L (ref 135–145)
Total Bilirubin: 0.5 mg/dL (ref 0.2–1.2)
Total Protein: 6.6 g/dL (ref 6.0–8.3)

## 2017-07-31 LAB — LIPID PANEL
Cholesterol: 174 mg/dL (ref 0–200)
HDL: 31.7 mg/dL — ABNORMAL LOW (ref >39.00)
NonHDL: 142.16
Total CHOL/HDL Ratio: 5
Triglycerides: 266 mg/dL — ABNORMAL HIGH (ref 0.0–149.0)
VLDL: 53.2 mg/dL — ABNORMAL HIGH (ref 0.0–40.0)

## 2017-07-31 LAB — PSA: PSA: 0.76 ng/mL (ref 0.10–4.00)

## 2017-07-31 LAB — HEMOGLOBIN A1C: Hgb A1c MFr Bld: 7.6 % — ABNORMAL HIGH (ref 4.6–6.5)

## 2017-07-31 LAB — MICROALBUMIN / CREATININE URINE RATIO
Creatinine,U: 65 mg/dL
Microalb Creat Ratio: 1.1 mg/g (ref 0.0–30.0)
Microalb, Ur: <0.7 mg/dL (ref 0.0–1.9)

## 2017-07-31 LAB — HM DIABETES EYE EXAM

## 2017-07-31 LAB — LDL CHOLESTEROL, DIRECT: Direct LDL: 111 mg/dL

## 2017-07-31 NOTE — Progress Notes (Signed)
Diabetes:  Using medications without difficulties:yes Hypoglycemic episodes: lowest reading was 95 Hyperglycemic episodes:no Feet problems: edema at baseline.   Blood Sugars averaging: had been as low as 150, had been occ higher, variable.   eye exam within last year: pending for later today.   Labs pending.  Nonfasting.  D/w pt about diet- he switched over to wheat toast.  When on lower carb diet then he noted more joint aches but I can't explain that, d/w pt.    He went to the bariatric into/info class.  He is considering  options.   F/u from cellulitis.  Off abx currently.  He has some chronic brawny changes on the bilateral shins but no fevers.  He has not been using compression stockings much.  Prostate cancer screening and PSA options (with potential risks and benefits of testing vs not testing) were discussed along with recent recs/guidelines.  He opted for testing PSA at this point.  Meds, vitals, and allergies reviewed.   ROS: Per HPI unless specifically indicated in ROS section   GEN: nad, alert and oriented HEENT: mucous membranes moist NECK: supple w/o LA CV: rrr. PULM: ctab, no inc wob ABD: soft, +bs EXT: 1+BLE edema SKIN: no acute rash but mild/minimal chronic brawny changes on the bilateral shins noted  Diabetic foot exam: Normal inspection No skin breakdown No calluses  Normal DP pulses Normal sensation to light touch and monofilament B 1st nails thickened.

## 2017-07-31 NOTE — Patient Instructions (Addendum)
Go to the lab on the way out.  We'll contact you with your lab report. Take care.  Glad to see you.  Try above the knee compression stockings.  Ask the eye clinic to check the back of your eye for diabetes.

## 2017-08-02 NOTE — Assessment & Plan Note (Signed)
>  25 minutes spent in face to face time with patient, >50% spent in counselling or coordination of care, discussing the overlap of his conditions and symptoms. Labs pending.  Nonfasting.  D/w pt about diet- he switched over to wheat toast.  When on lower carb diet then he noted more joint aches but I can't explain that, d/w pt.   See notes on labs.  He needs weight loss.  He does not have signs of active cellulitis on exam but does have some chronic brawny skin changes on the bilateral shins.  He needs weight loss, discussed with patient about options.  He is considering bariatric intervention.  Discussed with him about using compression stockings that go above the knee.  I do not expect this to improve without some form of external compression and/or weight loss.  Okay for outpatient follow-up.

## 2017-08-05 ENCOUNTER — Encounter: Payer: Self-pay | Admitting: Family Medicine

## 2017-08-05 DIAGNOSIS — E785 Hyperlipidemia, unspecified: Secondary | ICD-10-CM | POA: Insufficient documentation

## 2017-08-08 ENCOUNTER — Other Ambulatory Visit: Payer: Self-pay | Admitting: Family Medicine

## 2017-08-08 MED ORDER — ATORVASTATIN CALCIUM 10 MG PO TABS: 10.0000 mg | ORAL_TABLET | Freq: Every day | ORAL | 3 refills | Status: DC

## 2017-08-14 ENCOUNTER — Encounter: Payer: Self-pay | Admitting: Family Medicine

## 2017-08-16 ENCOUNTER — Other Ambulatory Visit: Payer: Self-pay | Admitting: Family Medicine

## 2017-08-29 ENCOUNTER — Ambulatory Visit: Payer: BC Managed Care – PPO | Admitting: Nurse Practitioner

## 2017-08-29 ENCOUNTER — Encounter: Payer: Self-pay | Admitting: Nurse Practitioner

## 2017-08-29 DIAGNOSIS — B349 Viral infection, unspecified: Secondary | ICD-10-CM | POA: Insufficient documentation

## 2017-08-29 NOTE — Patient Instructions (Addendum)
Call office if no improvement 48hrs.  Viral Illness, Adult Viruses are tiny germs that can get into a person's body and cause illness. There are many different types of viruses, and they cause many types of illness. Viral illnesses can range from mild to severe. They can affect various parts of the body. Common illnesses that are caused by a virus include colds and the flu. Viral illnesses also include serious conditions such as HIV/AIDS (human immunodeficiency virus/acquired immunodeficiency syndrome). A few viruses have been linked to certain cancers. What are the causes? Many types of viruses can cause illness. Viruses invade cells in your body, multiply, and cause the infected cells to malfunction or die. When the cell dies, it releases more of the virus. When this happens, you develop symptoms of the illness, and the virus continues to spread to other cells. If the virus takes over the function of the cell, it can cause the cell to divide and grow out of control, as is the case when a virus causes cancer. Different viruses get into the body in different ways. You can get a virus by:  Swallowing food or water that is contaminated with the virus.  Breathing in droplets that have been coughed or sneezed into the air by an infected person.  Touching a surface that has been contaminated with the virus and then touching your eyes, nose, or mouth.  Being bitten by an insect or animal that carries the virus.  Having sexual contact with a person who is infected with the virus.  Being exposed to blood or fluids that contain the virus, either through an open cut or during a transfusion.  If a virus enters your body, your body's defense system (immune system) will try to fight the virus. You may be at higher risk for a viral illness if your immune system is weak. What are the signs or symptoms? Symptoms vary depending on the type of virus and the location of the cells that it invades. Common symptoms  of the main types of viral illnesses include: Cold and flu viruses  Fever.  Headache.  Sore throat.  Muscle aches.  Nasal congestion.  Cough. Digestive system (gastrointestinal) viruses  Fever.  Abdominal pain.  Nausea.  Diarrhea. Liver viruses (hepatitis)  Loss of appetite.  Tiredness.  Yellowing of the skin (jaundice). Brain and spinal cord viruses  Fever.  Headache.  Stiff neck.  Nausea and vomiting.  Confusion or sleepiness. Skin viruses  Warts.  Itching.  Rash. Sexually transmitted viruses  Discharge.  Swelling.  Redness.  Rash. How is this treated? Viruses can be difficult to treat because they live within cells. Antibiotic medicines do not treat viruses because these drugs do not get inside cells. Treatment for a viral illness may include:  Resting and drinking plenty of fluids.  Medicines to relieve symptoms. These can include over-the-counter medicine for pain and fever, medicines for cough or congestion, and medicines to relieve diarrhea.  Antiviral medicines. These drugs are available only for certain types of viruses. They may help reduce flu symptoms if taken early. There are also many antiviral medicines for hepatitis and HIV/AIDS.  Some viral illnesses can be prevented with vaccinations. A common example is the flu shot. Follow these instructions at home: Medicines   Take over-the-counter and prescription medicines only as told by your health care provider.  If you were prescribed an antiviral medicine, take it as told by your health care provider. Do not stop taking the medicine even if you  start to feel better.  Be aware of when antibiotics are needed and when they are not needed. Antibiotics do not treat viruses. If your health care provider thinks that you may have a bacterial infection as well as a viral infection, you may get an antibiotic. ? Do not ask for an antibiotic prescription if you have been diagnosed with a  viral illness. That will not make your illness go away faster. ? Frequently taking antibiotics when they are not needed can lead to antibiotic resistance. When this develops, the medicine no longer works against the bacteria that it normally fights. General instructions  Drink enough fluids to keep your urine clear or pale yellow.  Rest as much as possible.  Return to your normal activities as told by your health care provider. Ask your health care provider what activities are safe for you.  Keep all follow-up visits as told by your health care provider. This is important. How is this prevented? Take these actions to reduce your risk of viral infection:  Eat a healthy diet and get enough rest.  Wash your hands often with soap and water. This is especially important when you are in public places. If soap and water are not available, use hand sanitizer.  Avoid close contact with friends and family who have a viral illness.  If you travel to areas where viral gastrointestinal infection is common, avoid drinking water or eating raw food.  Keep your immunizations up to date. Get a flu shot every year as told by your health care provider.  Do not share toothbrushes, nail clippers, razors, or needles with other people.  Always practice safe sex.  Contact a health care provider if:  You have symptoms of a viral illness that do not go away.  Your symptoms come back after going away.  Your symptoms get worse. Get help right away if:  You have trouble breathing.  You have a severe headache or a stiff neck.  You have severe vomiting or abdominal pain. This information is not intended to replace advice given to you by your health care provider. Make sure you discuss any questions you have with your health care provider. Document Released: 09/24/2015 Document Revised: 10/27/2015 Document Reviewed: 09/24/2015 Elsevier Interactive Patient Education  [employer].

## 2017-08-29 NOTE — Progress Notes (Signed)
Subjective:  Patient ID: Glenn Lyons, male    DOB: 1956/03/26  Age: 62 y.o. MRN: [id number]  CC: Generalized Body Aches (yesterday started getting chills and body aches, OTC gave some relief. No cough or congestion. Better today than yesterday. )  Onset of symptoms 2hrs after eating pork tenderloin from restaurant [place].  Muscle Pain  This is a new problem. The current episode started yesterday. The problem occurs constantly. The problem has been gradually improving since onset. The context of the pain is unknown. Pain location: generalized. The pain is medium. Nothing aggravates the symptoms. Associated symptoms include fatigue. Pertinent negatives include no abdominal pain, chest pain, constipation, diarrhea, dysuria, eye pain, fever, headaches, joint swelling, nausea, rash, stiffness, sensory change, shortness of breath, swollen glands, urinary symptoms, vaginal discharge, visual change, vomiting, weakness or wheezing. (And chills) Past treatments include nothing. There is no swelling present.   Has not checked glucose today. Glucose last night 125.  Outpatient Medications Prior to Visit  Medication Sig Dispense Refill  . acetaminophen (TYLENOL) 325 MG tablet Take 2 tablets (650 mg total) by mouth every 6 (six) hours as needed (headache, shoulder pain). 100 tablet 1  . aspirin 81 MG tablet Take 81 mg by mouth daily.      Marland Kitchen atorvastatin (LIPITOR) 10 MG tablet Take 1 tablet (10 mg total) by mouth daily. 90 tablet 3  . Blood Glucose Monitoring Suppl (ONETOUCH VERIO) w/Device KIT Inject 1 kit into the skin every morning. Use to test blood sugar each day before breakfast and 2 hours after a meal for 2 weeks.  Diagnosis:  E11.9  Non-insulin dependent. 1 kit 0  . cetirizine (ZYRTEC) 10 MG tablet Take 10 mg by mouth.    . ciclopirox (PENLAC) 8 % solution APPLY DAILY AT BEDTIME ON SKIN & NAIL OVER PREVIOUS COAT.AFTER 7 DAYS REMOVE WITH ALCOHOL & REPEAT  5  . citalopram (CELEXA) 20 MG tablet Take  1 tablet (20 mg total) by mouth daily. 90 tablet 3  . Diclofenac Sodium (PENNSAID) 2 % SOLN Place 1 application onto the skin 2 (two) times daily. 1 Bottle 3  . fluticasone (FLONASE) 50 MCG/ACT nasal spray Place 2 sprays into both nostrils daily. 16 g 0  . glipiZIDE (GLUCOTROL) 5 MG tablet Take 1 tablet (5 mg total) by mouth daily before breakfast. 90 tablet 1  . glucose blood (ONETOUCH VERIO) test strip USE TO TEST BLOOD SUGAR ONCE DAILY OR AS INSTRUCTED 100 each 3  . Lancets (ONETOUCH ULTRASOFT) lancets USE TO TEST BLOOD SUGAR ONCE DAILY OR AS NEEDED 100 each 3  . loratadine (CLARITIN) 10 MG tablet Take 10 mg by mouth daily.    . metFORMIN (GLUCOPHAGE) 500 MG tablet Take 1 tablet (500 mg total) by mouth 2 (two) times daily with a meal.    . metFORMIN (GLUCOPHAGE) 500 MG tablet Take 1 tablet (500 mg total) by mouth 2 (two) times daily with a meal. 180 tablet 1  . Multiple Vitamin (MULTIVITAMIN WITH MINERALS) TABS tablet Take 1 tablet by mouth daily.    Marland Kitchen oxymetazoline (AFRIN NASAL SPRAY) 0.05 % nasal spray Place 1 spray into both nostrils 2 (two) times daily. Use only for 3days, then stop 30 mL 0  . saccharomyces boulardii (FLORASTOR) 250 MG capsule Take 1 capsule (250 mg total) by mouth 2 (two) times daily.    . sodium chloride (OCEAN) 0.65 % SOLN nasal spray Place 1 spray into both nostrils as needed for congestion. 15 mL 0  .  torsemide (DEMADEX) 20 MG tablet Take 1 tablet (20 mg total) by mouth daily. 180 tablet 2   No facility-administered medications prior to visit.     ROS See HPI  Objective:  BP (!) 122/58 (BP Location: Left Arm, Patient Position: Sitting, Cuff Size: Large)   Pulse 67   Temp 97.6 F (36.4 C) (Oral)   Ht 6' (1.829 m)   Wt (!) 390 lb 9.6 oz (177.2 kg)   SpO2 96%   BMI 52.97 kg/m   BP Readings from Last 3 Encounters:  08/29/17 (!) 122/58  07/31/17 132/72  07/02/17 122/70    Wt Readings from Last 3 Encounters:  08/29/17 (!) 390 lb 9.6 oz (177.2 kg)    07/31/17 (!) 385 lb 8 oz (174.9 kg)  07/02/17 (!) 389 lb (176.4 kg)    Physical Exam  Constitutional: He is oriented to person, place, and time. No distress.  HENT:  Right Ear: Tympanic membrane, external ear and ear canal normal.  Left Ear: Tympanic membrane, external ear and ear canal normal.  Nose: Nose normal. Right sinus exhibits no maxillary sinus tenderness and no frontal sinus tenderness. Left sinus exhibits no maxillary sinus tenderness and no frontal sinus tenderness.  Mouth/Throat: Oropharynx is clear and moist. No oropharyngeal exudate or posterior oropharyngeal erythema.  Eyes: Pupils are equal, round, and reactive to light. Conjunctivae and EOM are normal.  Neck: Normal range of motion. Neck supple.  Lymphadenopathy:    He has no cervical adenopathy.  Neurological: He is alert and oriented to person, place, and time.  Skin: No rash noted.  Psychiatric: He has a normal mood and affect. His behavior is normal.  Vitals reviewed.   Lab Results  Component Value Date   WBC 18.8 (H) 10/06/2014   HGB 13.9 10/06/2015   HCT 41.0 10/06/2015   PLT 207 10/06/2014   GLUCOSE 183 (H) 07/31/2017   CHOL 174 07/31/2017   TRIG 266.0 (H) 07/31/2017   HDL 31.70 (L) 07/31/2017   LDLDIRECT 111.0 07/31/2017   ALT 17 07/31/2017   AST 12 07/31/2017   NA 136 07/31/2017   K 4.5 07/31/2017   CL 101 07/31/2017   CREATININE 0.89 07/31/2017   BUN 14 07/31/2017   CO2 31 07/31/2017   TSH 1.42 08/03/2014   PSA 0.76 07/31/2017   HGBA1C 7.6 (H) 07/31/2017   MICROALBUR <0.7 07/31/2017    Dg Knee Complete 4 Views Left  Result Date: 06/12/2017 CLINICAL DATA:  Left knee pain and swelling.  No known injury. EXAM: LEFT KNEE - COMPLETE 4+ VIEW COMPARISON:  No recent. FINDINGS: Tricompartment degenerative change. No acute abnormality. No evidence of fracture or dislocation. IMPRESSION: Tricompartment degenerative change. No acute abnormality identified. Electronically Signed   By: Marcello Moores  Register    On: 06/12/2017 12:59    Assessment & Plan:   Nil was seen today for generalized body aches.  Diagnoses and all orders for this visit:  Acute viral syndrome   I am having Le Yeung maintain his aspirin, multivitamin with minerals, loratadine, acetaminophen, ciclopirox, ONETOUCH VERIO, cetirizine, torsemide, citalopram, glucose blood, onetouch ultrasoft, metFORMIN, glipiZIDE, fluticasone, oxymetazoline, sodium chloride, saccharomyces boulardii, Diclofenac Sodium, atorvastatin, and metFORMIN.  No orders of the defined types were placed in this encounter.   Follow-up: Return if symptoms worsen or fail to improve.  Wilfred Lacy, NP

## 2017-08-30 ENCOUNTER — Ambulatory Visit: Payer: BC Managed Care – PPO | Admitting: Nurse Practitioner

## 2017-09-02 ENCOUNTER — Other Ambulatory Visit: Payer: Self-pay | Admitting: Family Medicine

## 2017-09-02 DIAGNOSIS — E119 Type 2 diabetes mellitus without complications: Secondary | ICD-10-CM

## 2017-09-03 ENCOUNTER — Telehealth: Payer: Self-pay | Admitting: Radiology

## 2017-09-03 NOTE — Telephone Encounter (Signed)
LM that his lab appt was canceled for 4.12, per Dr Josefine Class request

## 2017-09-04 ENCOUNTER — Other Ambulatory Visit: Payer: Self-pay | Admitting: Family Medicine

## 2017-09-04 ENCOUNTER — Encounter: Payer: Self-pay | Admitting: Family Medicine

## 2017-09-07 ENCOUNTER — Other Ambulatory Visit: Payer: BC Managed Care – PPO

## 2017-09-10 ENCOUNTER — Encounter: Payer: BC Managed Care – PPO | Admitting: Family Medicine

## 2017-09-20 ENCOUNTER — Encounter: Payer: Self-pay | Admitting: Internal Medicine

## 2017-10-16 ENCOUNTER — Ambulatory Visit (INDEPENDENT_AMBULATORY_CARE_PROVIDER_SITE_OTHER): Payer: BC Managed Care – PPO

## 2017-10-16 ENCOUNTER — Encounter: Payer: Self-pay | Admitting: Family Medicine

## 2017-10-16 ENCOUNTER — Ambulatory Visit: Payer: BC Managed Care – PPO | Admitting: Family Medicine

## 2017-10-16 VITALS — BP 136/78 | HR 89 | Temp 98.3°F | Ht 72.0 in | Wt 389.0 lb

## 2017-10-16 DIAGNOSIS — M25562 Pain in left knee: Secondary | ICD-10-CM

## 2017-10-16 DIAGNOSIS — M1712 Unilateral primary osteoarthritis, left knee: Secondary | ICD-10-CM | POA: Diagnosis not present

## 2017-10-16 DIAGNOSIS — G8929 Other chronic pain: Secondary | ICD-10-CM | POA: Diagnosis not present

## 2017-10-16 NOTE — Assessment & Plan Note (Signed)
Pain likely an exacerbation of his underlying arthritis.  He has been trying a medial knee brace.  Is going through with nutrition and plans on having bariatric surgery in some point. -Injection today -Counseled on home exercise therapy -If limited improvement consider Visco supplementation

## 2017-10-16 NOTE — Progress Notes (Signed)
Glenn Lyons - 62 y.o. male MRN [id number]  Date of birth: 1956/03/26  SUBJECTIVE:  Including CC & ROS.  Chief Complaint  Patient presents with  . Left Knee pain    Glenn Lyons is a 62 y.o. male that is presenting with left knee pain. He was seen on 06/12/17 for same pain. He states the pain has not improved. Located posteriorly and in the anterior aspect. Denies swelling or tenderness. Pain is worse when he is walking. He has a knee brace but has not been wearing it regularly.  Denies any injury.  Pain is worse with walking up and down stairs.  Pain is worse with standing from a seated position.  Pain is localized to the knee.  Pain is throbbing in nature.  Independent review of the left knee x-ray from 1/15 shows moderate to severe medial joint space narrowing.   Review of Systems  Constitutional: Negative for fever.  HENT: Negative for congestion.   Respiratory: Negative for cough.   Cardiovascular: Negative for chest pain.  Gastrointestinal: Negative for abdominal pain.  Musculoskeletal: Positive for arthralgias and gait problem.  Skin: Negative for color change.  Neurological: Negative for weakness.  Hematological: Negative for adenopathy.  Psychiatric/Behavioral: Negative for agitation.    HISTORY: Past Medical, Surgical, Social, and Family History Reviewed & Updated per EMR.   Pertinent Historical Findings include:  Past Medical History:  Diagnosis Date  . Achalasia    with prev eval at [facility].  No intervention as of 2012  . Aspiration pneumonia ([place]) 10/05/2014  . Backache, unspecified   . Cardiac pacemaker St. Jude    ERI 2008  . Depressive disorder, not elsewhere classified   . Esophageal reflux   . Morbid obesity ([place])   . Obstructive sleep apnea    biapap settign 25-22  . Osteoarthrosis, unspecified whether generalized or localized, unspecified site   . Osteoarthrosis, unspecified whether generalized or localized, unspecified site   . Presence of permanent cardiac  pacemaker    St. Jude- Dr. Caryl Comes follows -device Check 07-06-14  . Sinus bradycardia /pauses   . Stricture and stenosis of esophagus     Past Surgical History:  Procedure Laterality Date  . BALLOON DILATION N/A 10/06/2015   Procedure: BALLOON DILATION;  Surgeon: Mauri Pole, MD;  Location: [facility];  Service: Endoscopy;  Laterality: N/A;  . COLONOSCOPY W/ POLYPECTOMY  11/01/2010  . COLONOSCOPY WITH PROPOFOL N/A 10/05/2014   Procedure: COLONOSCOPY WITH PROPOFOL;  Surgeon: Gatha Mayer, MD;  Location: [facility];  Service: Endoscopy;  Laterality: N/A;  . ESOPHAGEAL DILATION    . ESOPHAGEAL MANOMETRY N/A 08/23/2015   Procedure: ESOPHAGEAL MANOMETRY (EM);  Surgeon: Mauri Pole, MD;  Location: [facility];  Service: Endoscopy;  Laterality: N/A;  . ESOPHAGOGASTRODUODENOSCOPY N/A 10/09/2014   Procedure: ESOPHAGOGASTRODUODENOSCOPY (EGD);  Surgeon: Gatha Mayer, MD;  Location: [facility];  Service: Endoscopy;  Laterality: N/A;  . ESOPHAGOGASTRODUODENOSCOPY (EGD) WITH PROPOFOL N/A 08/23/2015   Procedure: ESOPHAGOGASTRODUODENOSCOPY (EGD) WITH PROPOFOL;  Surgeon: Mauri Pole, MD;  Location: [facility];  Service: Endoscopy;  Laterality: N/A;  . ESOPHAGOGASTRODUODENOSCOPY (EGD) WITH PROPOFOL N/A 10/06/2015   Procedure: ESOPHAGOGASTRODUODENOSCOPY (EGD) WITH PROPOFOL;  Surgeon: Mauri Pole, MD;  Location: [facility];  Service: Endoscopy;  Laterality: N/A;  Rigiflex ballon size 22m-35mm size 45 minute proc,need Fluro Gastografin esophagram 2 hrs post EGD   . PACEMAKER INSERTION      Allergies  Allergen Reactions  . Morphine Other (See Comments)    REACTION:  Heart stopped.    Family History  Problem Relation Age of Onset  . Heart attack Father   . Heart disease Father   . Cancer Father        multiple myeloma  . Hypertension Father   . Prostate cancer Father        possible dx  . Hypertension Mother   . Dementia Mother   . Colon cancer Neg Hx       Social History   Socioeconomic History  . Marital status: Married    Spouse name: Not on file  . Number of children: 2  . Years of education: Not on file  . Highest education level: Not on file  Occupational History  . 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 Occupation: singer    Employer: [employer]  Social Needs  . Financial resource strain: Not on file  . Food insecurity:    Worry: Not on file    Inability: Not on file  . Transportation needs:    Medical: Not on file    Non-medical: Not on file  Tobacco Use  . Smoking status: Former Smoker    Packs/day: 1.00    Years: 20.00    Pack years: 20.00    Types: Cigarettes    Last attempt to quit: 05/29/2004    Years since quitting: 13.3  . Smokeless tobacco: Never Used  Substance and Sexual Activity  . Alcohol use: Yes    Comment: rarely  . Drug use: No  . Sexual activity: Yes  Lifestyle  . Physical activity:    Days per week: Not on file    Minutes per session: Not on file  . Stress: Not on file  Relationships  . Social connections:    Talks on phone: Not on file    Gets together: Not on file    Attends religious service: Not on file    Active member of club or organization: Not on file    Attends meetings of clubs or organizations: Not on file    Relationship status: Not on file  . Intimate partner violence:    Fear of current or ex partner: Not on file    Emotionally abused: Not on file    Physically abused: Not on file    Forced sexual activity: Not on file  Other Topics Concern  . Not on file  Social History Narrative   From [place]   Married     PHYSICAL EXAM:  VS: BP 136/78 (BP Location: Left Arm, Patient Position: Sitting, Cuff Size: Normal)   Pulse 89   Temp 98.3 F (36.8 C) (Oral)   Ht 6' (1.829 m)   Wt (!) 389 lb (176.4 kg)   SpO2 95%   BMI 52.76 kg/m  Physical Exam Gen: NAD, alert, cooperative with exam, well-appearing ENT: normal lips, normal nasal mucosa,  Eye: normal  EOM, normal conjunctiva and lids CV:  no edema, +2 pedal pulses   Resp: no accessory muscle use, non-labored,  Skin: no rashes, no areas of induration  Neuro: normal tone, normal sensation to touch Psych:  normal insight, alert and oriented MSK:  Left Knee: Normal to inspection with no erythema or effusion or obvious bony abnormalities. Tenderness to palpation over the lateral joint line ROM full in flexion and extension and lower leg rotation. Negative Mcmurray's Non painful patellar compression. Patellar glide without crepitus. Patellar and quadriceps tendons unremarkable. Hamstring and quadriceps strength is normal.  Neurovascularly intact  Aspiration/Injection Procedure Note Gonzalo Waymire 09/13/1955  Procedure: Injection Indications: left knee pain   Procedure Details Consent: Risks of procedure as well as the alternatives and risks of each were explained to the (patient/caregiver).  Consent for procedure obtained. Time Out: Verified patient identification, verified procedure, site/side was marked, verified correct patient position, special equipment/implants available, medications/allergies/relevent history reviewed, required imaging and test results available.  Performed.  The area was cleaned with iodine and alcohol swabs.    The left knee superior lateral SPP was injected using 1 cc's of 40 mg/mL Kenalog and 4 cc's of 0.25% bupivacaine with a 22 1 1/2" needle.  Ultrasound was used. Images were obtained in Long views showing the injection.    A sterile dressing was applied.  Patient did tolerate procedure well.     ASSESSMENT & PLAN:   OA (osteoarthritis) of knee Pain likely an exacerbation of his underlying arthritis.  He has been trying a medial knee brace.  Is going through with nutrition and plans on having bariatric surgery in some point. -Injection today -Counseled on home exercise therapy -If limited improvement consider Visco supplementation

## 2017-10-16 NOTE — Patient Instructions (Signed)
Nice to meet you  Please try the exercises  Please try compression for your knee  Please follow up with me in 4 weeks.

## 2017-11-06 ENCOUNTER — Other Ambulatory Visit: Payer: Self-pay | Admitting: Family Medicine

## 2017-11-06 ENCOUNTER — Ambulatory Visit: Payer: BC Managed Care – PPO | Admitting: Family Medicine

## 2017-11-06 DIAGNOSIS — Z0289 Encounter for other administrative examinations: Secondary | ICD-10-CM

## 2017-12-05 ENCOUNTER — Encounter: Payer: BC Managed Care – PPO | Admitting: Physician Assistant

## 2017-12-11 ENCOUNTER — Encounter: Payer: Self-pay | Admitting: Family Medicine

## 2017-12-17 ENCOUNTER — Encounter: Payer: BC Managed Care – PPO | Admitting: Physician Assistant

## 2017-12-20 ENCOUNTER — Ambulatory Visit: Payer: BC Managed Care – PPO | Admitting: Family Medicine

## 2017-12-20 ENCOUNTER — Ambulatory Visit (INDEPENDENT_AMBULATORY_CARE_PROVIDER_SITE_OTHER)
Admission: RE | Admit: 2017-12-20 | Discharge: 2017-12-20 | Disposition: A | Discharge status: Home / Self Care | Payer: BC Managed Care – PPO | Source: Ambulatory Visit | Attending: Family Medicine | Admitting: Family Medicine

## 2017-12-20 ENCOUNTER — Encounter: Payer: Self-pay | Admitting: Family Medicine

## 2017-12-20 VITALS — BP 140/80 | HR 62 | Ht 72.0 in | Wt 390.0 lb

## 2017-12-20 DIAGNOSIS — M170 Bilateral primary osteoarthritis of knee: Secondary | ICD-10-CM | POA: Diagnosis not present

## 2017-12-20 MED ORDER — TRIAMCINOLONE ACETONIDE 40 MG/ML IJ SUSP
40.0000 mg | Freq: Once | INTRAMUSCULAR | Status: AC
  Administered 2017-12-20: 40 mg via INTRAMUSCULAR

## 2017-12-20 NOTE — Assessment & Plan Note (Signed)
Has known OA of his knees. Having pain that is becoming more difficult to deal with. Has tried different measures in the past. Struggling to lose weight.  - injection on left - aspiration and injection on right  - counseled on supportive measures - right knee xray today  - will try visco on the left

## 2017-12-20 NOTE — Patient Instructions (Signed)
Good to see you  Take tylenol 650 mg three times a day is the best evidence based medicine we have for arthritis.  Glucosamine sulfate 750mg  twice a day is a supplement that has been shown to help moderate to severe arthritis. Vitamin D 2000 IU daily Fish oil 2 grams daily.  Tumeric 500mg  twice daily.  Capsaicin topically up to four times a day may also help with pain. We will run your insurance for the gel injections and get back to you

## 2017-12-20 NOTE — Progress Notes (Signed)
Glenn Lyons - 62 y.o. male MRN [id number]  Date of birth: 1955-11-22  SUBJECTIVE:  Including CC & ROS.  No chief complaint on file.   Glenn Lyons is a 62 y.o. male that is presenting with bilateral knee pain. The left knee had a week of improvement after the injection. The pain is worse with walking, rising from a seated position and getting into and out of his car. No locking or giving way. Has tried different braces or compression. Has swelling intermittently. Has tried different over the counter rubs and creams. No prior surgery on his knees. Marland Kitchen  He received a left knee injection on 5/21    Independent review of the left knee x-ray from 1/15 shows severe degenerative changes in the medial compartment.    Review of Systems  Constitutional: Negative for fever.  HENT: Negative for congestion.   Respiratory: Negative for cough.   Cardiovascular: Negative for chest pain.  Gastrointestinal: Negative for abdominal pain.  Musculoskeletal: Positive for arthralgias, gait problem and joint swelling.  Skin: Negative for color change.  Neurological: Negative for weakness.  Hematological: Negative for adenopathy.  Psychiatric/Behavioral: Negative for agitation.    HISTORY: Past Medical, Surgical, Social, and Family History Reviewed & Updated per EMR.   Pertinent Historical Findings include:  Past Medical History:  Diagnosis Date  . Achalasia    with prev eval at [facility].  No intervention as of 2012  . Aspiration pneumonia ([place]) 10/05/2014  . Backache, unspecified   . Cardiac pacemaker St. Jude    ERI 2008  . Depressive disorder, not elsewhere classified   . Esophageal reflux   . Morbid obesity ([place])   . Obstructive sleep apnea    biapap settign 25-22  . Osteoarthrosis, unspecified whether generalized or localized, unspecified site   . Osteoarthrosis, unspecified whether generalized or localized, unspecified site   . Presence of permanent cardiac pacemaker    St. Jude- Dr. Caryl Comes follows  -device Check 07-06-14  . Sinus bradycardia /pauses   . Stricture and stenosis of esophagus     Past Surgical History:  Procedure Laterality Date  . BALLOON DILATION N/A 10/06/2015   Procedure: BALLOON DILATION;  Surgeon: Mauri Pole, MD;  Location: [facility];  Service: Endoscopy;  Laterality: N/A;  . COLONOSCOPY W/ POLYPECTOMY  11/01/2010  . COLONOSCOPY WITH PROPOFOL N/A 10/05/2014   Procedure: COLONOSCOPY WITH PROPOFOL;  Surgeon: Gatha Mayer, MD;  Location: [facility];  Service: Endoscopy;  Laterality: N/A;  . ESOPHAGEAL DILATION    . ESOPHAGEAL MANOMETRY N/A 08/23/2015   Procedure: ESOPHAGEAL MANOMETRY (EM);  Surgeon: Mauri Pole, MD;  Location: [facility];  Service: Endoscopy;  Laterality: N/A;  . ESOPHAGOGASTRODUODENOSCOPY N/A 10/09/2014   Procedure: ESOPHAGOGASTRODUODENOSCOPY (EGD);  Surgeon: Gatha Mayer, MD;  Location: [facility];  Service: Endoscopy;  Laterality: N/A;  . ESOPHAGOGASTRODUODENOSCOPY (EGD) WITH PROPOFOL N/A 08/23/2015   Procedure: ESOPHAGOGASTRODUODENOSCOPY (EGD) WITH PROPOFOL;  Surgeon: Mauri Pole, MD;  [facility]: [facility];  Service: Endoscopy;  Laterality: N/A;  . ESOPHAGOGASTRODUODENOSCOPY (EGD) WITH PROPOFOL N/A 10/06/2015   Procedure: ESOPHAGOGASTRODUODENOSCOPY (EGD) WITH PROPOFOL;  Surgeon: Mauri Pole, MD;  Location: [facility];  Service: Endoscopy;  Laterality: N/A;  Rigiflex ballon size 69m-35mm size 45 minute proc,need Fluro Gastografin esophagram 2 hrs post EGD   . PACEMAKER INSERTION      Allergies  Allergen Reactions  . Morphine Other (See Comments)    REACTION: Heart stopped.    Family History  Problem Relation Age of Onset  .  Heart attack Father   . Heart disease Father   . Cancer Father        multiple myeloma  . Hypertension Father   . Prostate cancer Father        possible dx  . Hypertension Mother   . Dementia Mother   . Colon cancer Neg Hx      Social History   Socioeconomic History  .  Marital status: Married    Spouse name: Not on file  . Number of children: 2  . Years of education: Not on file  . Highest education level: Not on file  Occupational History  . 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 Occupation: singer    Employer: [employer]  Social Needs  . Financial resource strain: Not on file  . Food insecurity:    Worry: Not on file    Inability: Not on file  . Transportation needs:    Medical: Not on file    Non-medical: Not on file  Tobacco Use  . Smoking status: Former Smoker    Packs/day: 1.00    Years: 20.00    Pack years: 20.00    Types: Cigarettes    Last attempt to quit: 05/29/2004    Years since quitting: 13.5  . Smokeless tobacco: Never Used  Substance and Sexual Activity  . Alcohol use: Yes    Comment: rarely  . Drug use: No  . Sexual activity: Yes  Lifestyle  . Physical activity:    Days per week: Not on file    Minutes per session: Not on file  . Stress: Not on file  Relationships  . Social connections:    Talks on phone: Not on file    Gets together: Not on file    Attends religious service: Not on file    Active member of club or organization: Not on file    Attends meetings of clubs or organizations: Not on file    Relationship status: Not on file  . Intimate partner violence:    Fear of current or ex partner: Not on file    Emotionally abused: Not on file    Physically abused: Not on file    Forced sexual activity: Not on file  Other Topics Concern  . Not on file  Social History Narrative   From [place]   Married     PHYSICAL EXAM:  VS: BP 140/80 (BP Location: Left Arm, Patient Position: Sitting, Cuff Size: Large)   Pulse 62   Ht 6' (1.829 m)   Wt (!) 390 lb (176.9 kg)   SpO2 96%   BMI 52.89 kg/m  Physical Exam Gen: NAD, alert, cooperative with exam, well-appearing ENT: normal lips, normal nasal mucosa,  Eye: normal EOM, normal conjunctiva and lids CV:  no edema, +2 pedal pulses   Resp: no accessory  muscle use, non-labored,  GI: no masses or tenderness, no hernia  Skin: no rashes, no areas of induration  Neuro: normal tone, normal sensation to touch Psych:  normal insight, alert and oriented MSK:  Knee: Normal to inspection with no erythema or effusion or obvious bony abnormalities. Palpation normal with no warmth, joint line tenderness, patellar tenderness, or condyle tenderness. ROM full in flexion and extension and lower leg rotation. Ligaments with solid consistent endpoints including ACL, PCL, LCL, MCL. Negative Mcmurray's, Apley's, and Thessalonian tests. Non painful patellar compression. Patellar glide without crepitus. Patellar and quadriceps tendons unremarkable. Hamstring and quadriceps strength is normal.  Neurovascularly  intact    Aspiration/Injection Procedure Note Glenn Lyons 07-07-1955  Procedure: Injection Indications: left knee pain   Procedure Details Consent: Risks of procedure as well as the alternatives and risks of each were explained to the (patient/caregiver).  Consent for procedure obtained. Time Out: Verified patient identification, verified procedure, site/side was marked, verified correct patient position, special equipment/implants available, medications/allergies/relevent history reviewed, required imaging and test results available.  Performed.  The area was cleaned with iodine and alcohol swabs.    The left knee superiorlateral SPP was injected using 1 cc's of 40 mg kenalog and 4 cc's of 0.5% bupivacaine with a 22 2" needle.  Ultrasound was used. Images were obtained in Long views showing the injection.    A sterile dressing was applied.  Patient did tolerate procedure well.      Aspiration/Injection Procedure Note Glenn Lyons [birth year]  Procedure: Aspiration and Injection Indications: right knee pain  Procedure Details Consent: Risks of procedure as well as the alternatives and risks of each were explained to the (patient/caregiver).   Consent for procedure obtained. Time Out: Verified patient identification, verified procedure, site/side was marked, verified correct patient position, special equipment/implants available, medications/allergies/relevent history reviewed, required imaging and test results available.  Performed.  The area was cleaned with iodine and alcohol swabs.    The right knee superiorlateral SPP was injected with 3 cc's of 1% lidocaine without epinephrine into the skin and the tract. An 18 gauge needle was then inserted under ultrasound guidance and aspiration was accomplished. The syringe was switched a mixture  using 1 cc's of 40 mg kenalog and 4 cc's of 0.5% bupivacaine.  Ultrasound was used. Images were obtained in Long views showing the injection.    Amount of Fluid Aspirated: 10m Character of Fluid: clear and straw colored Fluid was sent for:n/a A sterile dressing was applied.  Patient did tolerate procedure well.      ASSESSMENT & PLAN:   OA (osteoarthritis) of knee Has known OA of his knees. Having pain that is becoming more difficult to deal with. Has tried different measures in the past. Struggling to lose weight.  - injection on left - aspiration and injection on right  - counseled on supportive measures - right knee xray today  - will try visco on the left

## 2017-12-30 ENCOUNTER — Encounter: Payer: Self-pay | Admitting: Family Medicine

## 2017-12-31 ENCOUNTER — Other Ambulatory Visit: Payer: Self-pay | Admitting: Internal Medicine

## 2017-12-31 NOTE — Telephone Encounter (Signed)
Medication is no longer on medication list Patient sees Dr. Caryl Comes.

## 2018-01-03 ENCOUNTER — Telehealth: Payer: Self-pay | Admitting: Family Medicine

## 2018-01-03 MED ORDER — TORSEMIDE 20 MG PO TABS: 20.0000 mg | ORAL_TABLET | Freq: Every day | ORAL | 0 refills | Status: DC | PRN

## 2018-01-03 NOTE — Telephone Encounter (Signed)
Please get an update from patient about torsemide use.  See mychart request. It looks like the med was prev discontinued.    Let me know what is going on.   And he is due for f/u labs at [facility] re: DM2.   Thanks.

## 2018-01-03 NOTE — Telephone Encounter (Signed)
Patient says he is taking the Torsemide pretty consistently on a daily basis.  Patient does not know why it would have been discontinued.  Patient will schedule FU in the next few weeks when he is available.  Patient is currently out of Torsemide and would appreciate the refill.

## 2018-01-03 NOTE — Telephone Encounter (Signed)
Sent. Thanks.   

## 2018-01-04 ENCOUNTER — Encounter: Payer: Self-pay | Admitting: Family Medicine

## 2018-01-08 ENCOUNTER — Encounter: Payer: Self-pay | Admitting: Family Medicine

## 2018-01-13 NOTE — Progress Notes (Deleted)
.    Cardiology Office Note Date:  01/13/2018  Patient ID:  Glenn Lyons, Glenn Lyons Sep 22, 1955, MRN [id number] PCP:  Tonia Ghent, MD  Electrophysiologist: Dr. Caryl Comes GI: Dr. Silverio Decamp   Chief Complaint: Routine annual EP/device visit  History of Present Illness: Glenn Lyons is a 62 y.o. male with history of achalasia with previous esophageal dilation and botox injection, GERD, OA,  OSA reports compliance,  bradycardia/pause that records state secondary to a medication/morphine injection that resulted in PPM, and morbid obesity, and HFpEF     He comes in today to to be seen for Dr. Caryl Comes.  Last seen by him in May 2018.  At that visit his lasix was changed to torsemide with HFpEF, suspect component of RHF.  Encouraged that he seek weight loss efforts.  *** symptoms *** meds *** fluid status *** labs (march 2019) *** remotes???    DEVICE information:  STJ  Dual chamber PPM, 05/06/05, Dr. Caryl Comes   Past Medical History:  Diagnosis Date  . Achalasia    with prev eval at [facility].  No intervention as of 2012  . Aspiration pneumonia ([place]) 10/05/2014  . Backache, unspecified   . Cardiac pacemaker St. Jude    ERI 2008  . Depressive disorder, not elsewhere classified   . Esophageal reflux   . Morbid obesity ([place])   . Obstructive sleep apnea    biapap settign 25-22  . Osteoarthrosis, unspecified whether generalized or localized, unspecified site   . Osteoarthrosis, unspecified whether generalized or localized, unspecified site   . Presence of permanent cardiac pacemaker    St. Jude- Dr. Caryl Comes follows -device Check 07-06-14  . Sinus bradycardia /pauses   . Stricture and stenosis of esophagus     Past Surgical History:  Procedure Laterality Date  . BALLOON DILATION N/A 10/06/2015   Procedure: BALLOON DILATION;  Surgeon: Mauri Pole, MD;  Location: [facility];  Service: Endoscopy;  Laterality: N/A;  . COLONOSCOPY W/ POLYPECTOMY  11/01/2010  . COLONOSCOPY WITH PROPOFOL N/A 10/05/2014   Procedure: COLONOSCOPY WITH PROPOFOL;  Surgeon: Gatha Mayer, MD;  [facility];  Service: Endoscopy;  Laterality: N/A;  . ESOPHAGEAL DILATION    . ESOPHAGEAL MANOMETRY N/A 08/23/2015   Procedure: ESOPHAGEAL MANOMETRY (EM);  Surgeon: Mauri Pole, MD;  [facility]: [facility];  Service: Endoscopy;  Laterality: N/A;  . ESOPHAGOGASTRODUODENOSCOPY N/A 10/09/2014   Procedure: ESOPHAGOGASTRODUODENOSCOPY (EGD);  Surgeon: Gatha Mayer, MD;  Location: [facility];  Service: Endoscopy;  Laterality: N/A;  . ESOPHAGOGASTRODUODENOSCOPY (EGD) WITH PROPOFOL N/A 08/23/2015   Procedure: ESOPHAGOGASTRODUODENOSCOPY (EGD) WITH PROPOFOL;  Surgeon: Mauri Pole, MD;  [facility];  Service: Endoscopy;  Laterality: N/A;  . ESOPHAGOGASTRODUODENOSCOPY (EGD) WITH PROPOFOL N/A 10/06/2015   Procedure: ESOPHAGOGASTRODUODENOSCOPY (EGD) WITH PROPOFOL;  Surgeon: Mauri Pole, MD;  Location: [facility];  Service: Endoscopy;  Laterality: N/A;  Rigiflex ballon size 70m-35mm size 45 minute proc,need Fluro Gastografin esophagram 2 hrs post EGD   . PACEMAKER INSERTION      Current Outpatient Medications  Medication Sig Dispense Refill  . acetaminophen (TYLENOL) 325 MG tablet Take 2 tablets (650 mg total) by mouth every 6 (six) hours as needed (headache, shoulder pain). 100 tablet 1  . aspirin 81 MG tablet Take 81 mg by mouth daily.      .Marland Kitchenatorvastatin (LIPITOR) 10 MG tablet Take 1 tablet (10 mg total) by mouth daily. 90 tablet 3  . Blood Glucose Monitoring Suppl (ONETOUCH VERIO) w/Device KIT Inject 1 kit into  the skin every morning. Use to test blood sugar each day before breakfast and 2 hours after a meal for 2 weeks.  Diagnosis:  E11.9  Non-insulin dependent. 1 kit 0  . cetirizine (ZYRTEC) 10 MG tablet Take 10 mg by mouth.    . ciclopirox (PENLAC) 8 % solution APPLY DAILY AT BEDTIME ON SKIN & NAIL OVER PREVIOUS COAT.AFTER 7 DAYS REMOVE WITH ALCOHOL & REPEAT  5  . citalopram (CELEXA) 20  MG tablet TAKE 1 TABLET (20 MG TOTAL) BY MOUTH DAILY. 90 tablet 3  . fluticasone (FLONASE) 50 MCG/ACT nasal spray Place 2 sprays into both nostrils daily. (Patient not taking: Reported on 12/20/2017) 16 g 0  . glipiZIDE (GLUCOTROL) 5 MG tablet TAKE 1 TABLET (5 MG TOTAL) BY MOUTH DAILY BEFORE BREAKFAST. 90 tablet 1  . glucose blood (ONETOUCH VERIO) test strip USE TO TEST BLOOD SUGAR ONCE DAILY OR AS INSTRUCTED 100 each 3  . Lancets (ONETOUCH ULTRASOFT) lancets USE TO TEST BLOOD SUGAR ONCE DAILY OR AS NEEDED 100 each 3  . loratadine (CLARITIN) 10 MG tablet Take 10 mg by mouth daily.    . metFORMIN (GLUCOPHAGE) 500 MG tablet Take 1 tablet (500 mg total) by mouth 2 (two) times daily with a meal. 180 tablet 1  . Multiple Vitamin (MULTIVITAMIN WITH MINERALS) TABS tablet Take 1 tablet by mouth daily.    Marland Kitchen oxymetazoline (AFRIN NASAL SPRAY) 0.05 % nasal spray Place 1 spray into both nostrils 2 (two) times daily. Use only for 3days, then stop 30 mL 0  . saccharomyces boulardii (FLORASTOR) 250 MG capsule Take 1 capsule (250 mg total) by mouth 2 (two) times daily.    . sodium chloride (OCEAN) 0.65 % SOLN nasal spray Place 1 spray into both nostrils as needed for congestion. 15 mL 0  . torsemide (DEMADEX) 20 MG tablet Take 1 tablet (20 mg total) by mouth daily as needed. 90 tablet 0   No current facility-administered medications for this visit.     Allergies:   Morphine   Social History:  The patient  reports that he quit smoking about 13 years ago. His smoking use included cigarettes. He has a 20.00 pack-year smoking history. He has never used smokeless tobacco. He reports that he drinks alcohol. He reports that he does not use drugs.   Family History:  The patient's family history includes Cancer in his father; Dementia in his mother; Heart attack in his father; Heart disease in his father; Hypertension in his father and mother; Prostate cancer in his father.  ROS:  Please see the history of present  illness.  All other systems are reviewed and otherwise negative.   PHYSICAL EXAM:  VS:  There were no vitals taken for this visit. BMI: There is no height or weight on file to calculate BMI. Very pleasent, morbidly obese WM, in no acute distress  HEENT: normocephalic, atraumatic  Neck: no JVD, carotid bruits or masses Cardiac:  *** RRR; no significant murmurs, no rubs, or gallops Lungs:  *** CTA b/l, no wheezing, rhonchi or rales  Abd: soft, nontender MS: no deformity or *** atrophy Ext: *** trace edema the patient reports as chronic, wearing support stockings Skin: warm and dry, no rash Neuro:  No gross deficits appreciated Psych: euthymic mood, full affect  *** PPM site (right chest) is stable, no tethering or discomfort   EKG:  Done today and reviewed by myself: *** PPM check today and reviewed by myself: ***  03/13/17: TTE Study Conclusions -  Left ventricle: The cavity size was normal. Wall thickness was   increased in a pattern of moderate LVH. Systolic function was   normal. The estimated ejection fraction was in the range of 60%   to 65%. Features are consistent with a pseudonormal left   ventricular filling pattern, with concomitant abnormal relaxation   and increased filling pressure (grade 2 diastolic dysfunction). - Right atrium: The atrium was mildly dilated. Impressions: - Extremely technically difficult echo .  Recent Labs: 07/31/2017: ALT 17; BUN 14; Creatinine, Ser 0.89; Potassium 4.5; Sodium 136  07/31/2017: Cholesterol 174; Direct LDL 111.0; HDL 31.70; Total CHOL/HDL Ratio 5; Triglycerides 266.0; VLDL 53.2   CrCl cannot be calculated (Patient's most recent lab result is older than the maximum 21 days allowed.).   Wt Readings from Last 3 Encounters:  12/20/17 (!) 390 lb (176.9 kg)  10/16/17 (!) 389 lb (176.4 kg)  08/29/17 (!) 390 lb 9.6 oz (177.2 kg)     Other studies reviewed: Additional studies/records reviewed today include: summarized  above      ASSESSMENT AND PLAN:  1. Bradycardia, PPM     *** Normal device function, no changes made     counseled on importance of device checks and f/u visits  2. Dependent LE edema, morbid obesity     *** Reported as chronic and unchanged for many years     He uses his lasix PRN  3. HFpEF     ***         Disposition: ***   Current medicines are reviewed at length with the patient today.  The patient did not have any concerns regarding medicines.  Haywood Lasso, PA-C 01/13/2018 5:42 PM     [address] [place] [address] [phone] (office)  [phone] (fax)

## 2018-01-15 ENCOUNTER — Encounter: Payer: BC Managed Care – PPO | Admitting: Physician Assistant

## 2018-01-21 ENCOUNTER — Other Ambulatory Visit: Payer: Self-pay | Admitting: Family Medicine

## 2018-01-25 ENCOUNTER — Encounter: Payer: Self-pay | Admitting: Family Medicine

## 2018-01-29 ENCOUNTER — Encounter: Payer: Self-pay | Admitting: Family Medicine

## 2018-01-31 ENCOUNTER — Ambulatory Visit: Payer: Self-pay | Admitting: Family Medicine

## 2018-01-31 ENCOUNTER — Encounter: Payer: BC Managed Care – PPO | Admitting: Physician Assistant

## 2018-02-05 ENCOUNTER — Ambulatory Visit: Payer: BC Managed Care – PPO | Admitting: Family Medicine

## 2018-02-05 ENCOUNTER — Encounter: Payer: Self-pay | Admitting: Family Medicine

## 2018-02-05 VITALS — BP 132/72 | HR 63 | Temp 98.0°F | Ht 72.0 in | Wt 392.0 lb

## 2018-02-05 DIAGNOSIS — E119 Type 2 diabetes mellitus without complications: Secondary | ICD-10-CM

## 2018-02-05 DIAGNOSIS — R609 Edema, unspecified: Secondary | ICD-10-CM | POA: Diagnosis not present

## 2018-02-05 LAB — POCT GLYCOSYLATED HEMOGLOBIN (HGB A1C): Hemoglobin A1C: 9.2 % — AB (ref 4.0–5.6)

## 2018-02-05 NOTE — Patient Instructions (Signed)
Don't change your meds for now.  Get through the appointment tomorrow and we'll go from there.  Take care.  Glad to see you.

## 2018-02-05 NOTE — Progress Notes (Signed)
Diabetes:  Using medications without difficulties: yes Hypoglycemic episodes: possibly, with urinary urgency.   Hyperglycemic episodes: no Feet problems: some pain at night, episodic (less foot sx when on low carb diet) Blood Sugars averaging: not checked often.  eye exam within last year: yes He had steroid shots in his knees and got back on higher carb diet.  A1c 9.2, up from prev, he was expecting that.   He is considering gastric bypass.  He prev went to [employer].  He has done nutrisystem, Atkins diet; mult attempts at weight loss over the years.  He has mult medical conditions affected/worsened by his weight.   Urinary urgency noted, taking torsemide daily.  That helped with swelling in BLE. PSA wnl 2019.   He is frustrated and annoyed with his situation regarding his weight. He didn't think he was depressed, more frustrated. No SI/HI.   Meds, vitals, and allergies reviewed.   ROS: Per HPI.  Unless specifically indicated otherwise in HPI, the patient denies:  GEN: nad, alert and oriented HEENT: mucous membranes moist NECK: supple w/o LA CV: rrr. PULM: ctab, no inc wob ABD: soft, +bs EXT: 1+ edema SKIN: no acute rash

## 2018-02-06 ENCOUNTER — Telehealth: Payer: Self-pay

## 2018-02-06 NOTE — Assessment & Plan Note (Signed)
Continue as needed torsemide in the meantime.  He agrees.

## 2018-02-06 NOTE — Telephone Encounter (Signed)
   [facility] Pre-operative Risk Assessment    Request for surgical clearance:  1. What type of surgery is being performed?  Bariatric   2. When is this surgery scheduled?  TBD   3. What type of clearance is required (medical clearance vs. Pharmacy clearance to hold med vs. Both)?  medical  4. Are there any medications that need to be held prior to surgery and how long?    5. Practice name and name of physician performing surgery?  [facility]   6. What is your office phone number [phone]    7.   What is your office fax number  [phone]  8.   Anesthesia type (None, local, MAC, general) ? general   Glenn Lyons 02/06/2018, 4:35 PM  _________________________________________________________________   (provider comments below)

## 2018-02-06 NOTE — Assessment & Plan Note (Signed)
He has multiple overlapping conditions, diabetes, obesity, edema, sleep apnea.  He is considering bariatric surgery.  I think it is reasonable to go for the consult.  He has that pending.  I will await those notes and we will go from there.  We talked about diet and exercise.  We opted not to change his medications at this point even though his A1c was elevated.  He is going to try to work on diet as best he can.  We will make further plans after he has his surgery consult.  He agrees with plan.

## 2018-02-08 ENCOUNTER — Encounter: Payer: Self-pay | Admitting: Internal Medicine

## 2018-02-08 NOTE — Telephone Encounter (Signed)
Patient has appointment scheduled for 03/13/18.   Surgery center notified.

## 2018-02-14 ENCOUNTER — Ambulatory Visit: Payer: BC Managed Care – PPO | Admitting: Family Medicine

## 2018-02-14 ENCOUNTER — Encounter: Payer: Self-pay | Admitting: Family Medicine

## 2018-02-14 DIAGNOSIS — M170 Bilateral primary osteoarthritis of knee: Secondary | ICD-10-CM | POA: Diagnosis not present

## 2018-02-14 NOTE — Progress Notes (Signed)
[name] - 62 y.o. male MRN [id number]  Date of birth: August 11, 1955  SUBJECTIVE:  Including CC & ROS.  Chief Complaint  Patient presents with  . Follow-up    Glenn Lyons is a 62 y.o. male that is here today for bilateral knee pain. He was seen in on 12/20/17 and received bilateral knee injections with improvement. Pain has been increasing over the past month. Admits to swelling. Difficulty bending his knee. He has been taking Tylenol for the pain.   He is being seen by his cardiologist to get cleared for gastric bypass.     Review of Systems  Constitutional: Negative for fever.  HENT: Negative for congestion.   Respiratory: Negative for cough.   Cardiovascular: Negative for chest pain.  Gastrointestinal: Negative for abdominal pain.  Musculoskeletal: Positive for arthralgias and joint swelling.  Skin: Negative for color change.  Hematological: Negative for adenopathy.  Psychiatric/Behavioral: Negative for agitation.    HISTORY: Past Medical, Surgical, Social, and Family History Reviewed & Updated per EMR.   Pertinent Historical Findings include:  Past Medical History:  Diagnosis Date  . Achalasia    with prev eval at [facility].  No intervention as of 2012  . Aspiration pneumonia ([place]) 10/05/2014  . Backache, unspecified   . Cardiac pacemaker St. Jude    ERI 2008  . Depressive disorder, not elsewhere classified   . Esophageal reflux   . Morbid obesity ([place])   . Obstructive sleep apnea    biapap settign 25-22  . Osteoarthrosis, unspecified whether generalized or localized, unspecified site   . Osteoarthrosis, unspecified whether generalized or localized, unspecified site   . Presence of permanent cardiac pacemaker    St. Jude- Dr. Caryl Comes follows -device Check 07-06-14  . Sinus bradycardia /pauses   . Stricture and stenosis of esophagus     Past Surgical History:  Procedure Laterality Date  . BALLOON DILATION N/A 10/06/2015   Procedure: BALLOON DILATION;  Surgeon: Mauri Pole, MD;  Location: [facility];  Service: Endoscopy;  Laterality: N/A;  . COLONOSCOPY W/ POLYPECTOMY  11/01/2010  . COLONOSCOPY WITH PROPOFOL N/A 10/05/2014   Procedure: COLONOSCOPY WITH PROPOFOL;  Surgeon: Gatha Mayer, MD;  Location: [facility];  Service: Endoscopy;  Laterality: N/A;  . ESOPHAGEAL DILATION    . ESOPHAGEAL MANOMETRY N/A 08/23/2015   Procedure: ESOPHAGEAL MANOMETRY (EM);  Surgeon: Mauri Pole, MD;  [facility]: [facility];  Service: Endoscopy;  Laterality: N/A;  . ESOPHAGOGASTRODUODENOSCOPY N/A 10/09/2014   Procedure: ESOPHAGOGASTRODUODENOSCOPY (EGD);  Surgeon: Gatha Mayer, MD;  Location: [facility];  Service: Endoscopy;  Laterality: N/A;  . ESOPHAGOGASTRODUODENOSCOPY (EGD) WITH PROPOFOL N/A 08/23/2015   Procedure: ESOPHAGOGASTRODUODENOSCOPY (EGD) WITH PROPOFOL;  Surgeon: Mauri Pole, MD;  [facility];  Service: Endoscopy;  Laterality: N/A;  . ESOPHAGOGASTRODUODENOSCOPY (EGD) WITH PROPOFOL N/A 10/06/2015   Procedure: ESOPHAGOGASTRODUODENOSCOPY (EGD) WITH PROPOFOL;  Surgeon: Mauri Pole, MD;  Location: [facility];  Service: Endoscopy;  Laterality: N/A;  Rigiflex ballon size 32m-35mm size 45 minute proc,need Fluro Gastografin esophagram 2 hrs post EGD   . PACEMAKER INSERTION      Allergies  Allergen Reactions  . Morphine Other (See Comments)    REACTION: Heart stopped.    Family History  Problem Relation Age of Onset  . Heart attack Father   . Heart disease Father   . Cancer Father        multiple myeloma  . Hypertension Father   . Prostate cancer Father  possible dx  . Hypertension Mother   . Dementia Mother   . Colon cancer Neg Hx      Social History   Socioeconomic History  . Marital status: Married    Spouse name: Not on file  . Number of children: 2  . Years of education: Not on file  . Highest education level: Not on file  Occupational History  . 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 Occupation: singer     Employer: [employer]  Social Needs  . Financial resource strain: Not on file  . Food insecurity:    Worry: Not on file    Inability: Not on file  . Transportation needs:    Medical: Not on file    Non-medical: Not on file  Tobacco Use  . Smoking status: Former Smoker    Packs/day: 1.00    Years: 20.00    Pack years: 20.00    Types: Cigarettes    Last attempt to quit: 05/29/2004    Years since quitting: 13.7  . Smokeless tobacco: Never Used  Substance and Sexual Activity  . Alcohol use: Yes    Comment: rarely  . Drug use: No  . Sexual activity: Yes  Lifestyle  . Physical activity:    Days per week: Not on file    Minutes per session: Not on file  . Stress: Not on file  Relationships  . Social connections:    Talks on phone: Not on file    Gets together: Not on file    Attends religious service: Not on file    Active member of club or organization: Not on file    Attends meetings of clubs or organizations: Not on file    Relationship status: Not on file  . Intimate partner violence:    Fear of current or ex partner: Not on file    Emotionally abused: Not on file    Physically abused: Not on file    Forced sexual activity: Not on file  Other Topics Concern  . Not on file  Social History Narrative   From [employer] cabin crew   Married     PHYSICAL EXAM:  VS: BP 128/64   Pulse 66   Ht 6' (1.829 m)   Wt (!) 395 lb (179.2 kg)   SpO2 99%   BMI 53.57 kg/m  Physical Exam Gen: NAD, alert, cooperative with exam, well-appearing ENT: normal lips, normal nasal mucosa,  Eye: normal EOM, normal conjunctiva and lids CV:  no edema, +2 pedal pulses   Resp: no accessory muscle use, non-labored,  Skin: no rashes, no areas of induration  Neuro: normal tone, normal sensation to touch Psych:  normal insight, alert and oriented MSK:  Left and Right knee:  Normal range of motion. Normal strength resistance. Instability with valgus testing. Neurovascularly  intact     ASSESSMENT & PLAN:   OA (osteoarthritis) of knee Too soon for steroids at this point.  Discussed Visco supplementation.  He will hold off for another month to receive a set of steroid injections at that time.  He can consider Visco after that.

## 2018-02-17 ENCOUNTER — Other Ambulatory Visit: Payer: Self-pay | Admitting: Family Medicine

## 2018-02-17 NOTE — Assessment & Plan Note (Signed)
Too soon for steroids at this point.  Discussed Visco supplementation.  He will hold off for another month to receive a set of steroid injections at that time.  He can consider Visco after that.

## 2018-03-04 ENCOUNTER — Ambulatory Visit: Payer: BC Managed Care – PPO | Admitting: Family Medicine

## 2018-03-04 ENCOUNTER — Ambulatory Visit: Payer: Self-pay

## 2018-03-04 ENCOUNTER — Encounter: Payer: Self-pay | Admitting: Family Medicine

## 2018-03-04 VITALS — BP 141/76 | HR 96 | Ht 72.0 in

## 2018-03-04 DIAGNOSIS — M170 Bilateral primary osteoarthritis of knee: Secondary | ICD-10-CM

## 2018-03-04 DIAGNOSIS — M1712 Unilateral primary osteoarthritis, left knee: Secondary | ICD-10-CM

## 2018-03-04 NOTE — Progress Notes (Signed)
Glenn Lyons - 62 y.o. male MRN [id number]  Date of birth: 05/02/1956  SUBJECTIVE:  Including CC & ROS.  Chief Complaint  Patient presents with  . Follow-up    Glenn Lyons is a 62 y.o. male that is here today bilateral knee pain. He received an injections in July and had improvement with theme.  Admits to swelling. Pain is exacerbated walking. He has been taking Motrin for the pain and applying ice. He has a trip planned to [place] tomorrow. Pain is intermittent. Pain is medial joint line. Denies any MOI. Localized to the knee.     Review of Systems  Constitutional: Negative for fever.  HENT: Negative for congestion.   Respiratory: Negative for cough.   Cardiovascular: Negative for chest pain.  Gastrointestinal: Negative for abdominal pain.  Musculoskeletal: Positive for arthralgias and joint swelling.  Skin: Negative for color change.  Neurological: Negative for weakness.  Hematological: Negative for adenopathy.  Psychiatric/Behavioral: Negative for agitation.    HISTORY: Past Medical, Surgical, Social, and Family History Reviewed & Updated per EMR.   Pertinent Historical Findings include:  Past Medical History:  Diagnosis Date  . Achalasia    with prev eval at [facility].  No intervention as of 2012  . Aspiration pneumonia ([place]) 10/05/2014  . Backache, unspecified   . Cardiac pacemaker St. Jude    ERI 2008  . Depressive disorder, not elsewhere classified   . Esophageal reflux   . Morbid obesity ([place])   . Obstructive sleep apnea    biapap settign 25-22  . Osteoarthrosis, unspecified whether generalized or localized, unspecified site   . Osteoarthrosis, unspecified whether generalized or localized, unspecified site   . Presence of permanent cardiac pacemaker    St. Jude- Dr. Caryl Comes follows -device Check 07-06-14  . Sinus bradycardia /pauses   . Stricture and stenosis of esophagus     Past Surgical History:  Procedure Laterality Date  . BALLOON DILATION N/A 10/06/2015   Procedure: BALLOON DILATION;  Surgeon: Mauri Pole, MD;  Location: [facility];  Service: Endoscopy;  Laterality: N/A;  . COLONOSCOPY W/ POLYPECTOMY  11/01/2010  . COLONOSCOPY WITH PROPOFOL N/A 10/05/2014   Procedure: COLONOSCOPY WITH PROPOFOL;  Surgeon: Gatha Mayer, MD;  [facility];  Service: Endoscopy;  Laterality: N/A;  . ESOPHAGEAL DILATION    . ESOPHAGEAL MANOMETRY N/A 08/23/2015   Procedure: ESOPHAGEAL MANOMETRY (EM);  Surgeon: Mauri Pole, MD;  [facility];  Service: Endoscopy;  Laterality: N/A;  . ESOPHAGOGASTRODUODENOSCOPY N/A 10/09/2014   Procedure: ESOPHAGOGASTRODUODENOSCOPY (EGD);  Surgeon: Gatha Mayer, MD;  Location: [facility];  Service: Endoscopy;  Laterality: N/A;  . ESOPHAGOGASTRODUODENOSCOPY (EGD) WITH PROPOFOL N/A 08/23/2015   Procedure: ESOPHAGOGASTRODUODENOSCOPY (EGD) WITH PROPOFOL;  Surgeon: Mauri Pole, MD;  [facility];  Service: Endoscopy;  Laterality: N/A;  . ESOPHAGOGASTRODUODENOSCOPY (EGD) WITH PROPOFOL N/A 10/06/2015   Procedure: ESOPHAGOGASTRODUODENOSCOPY (EGD) WITH PROPOFOL;  Surgeon: Mauri Pole, MD;  Location: [facility];  Service: Endoscopy;  Laterality: N/A;  Rigiflex ballon size 7m-35mm size 45 minute proc,need Fluro Gastografin esophagram 2 hrs post EGD   . PACEMAKER INSERTION      Allergies  Allergen Reactions  . Morphine Other (See Comments)    REACTION: Heart stopped.    Family History  Problem Relation Age of Onset  . Heart attack Father   . Heart disease Father   . Cancer Father        multiple myeloma  . Hypertension Father   . Prostate cancer Father  possible dx  . Hypertension Mother   . Dementia Mother   . Colon cancer Neg Hx      Social History   Socioeconomic History  . Marital status: Married    Spouse name: Not on file  . Number of children: 2  . Years of education: Not on file  . Highest education level: Not on file  Occupational History  .  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 Occupation: singer    Employer: [employer]  Social Needs  . Financial resource strain: Not on file  . Food insecurity:    Worry: Not on file    Inability: Not on file  . Transportation needs:    Medical: Not on file    Non-medical: Not on file  Tobacco Use  . Smoking status: Former Smoker    Packs/day: 1.00    Years: 20.00    Pack years: 20.00    Types: Cigarettes    Last attempt to quit: 05/29/2004    Years since quitting: 13.7  . Smokeless tobacco: Never Used  Substance and Sexual Activity  . Alcohol use: Yes    Comment: rarely  . Drug use: No  . Sexual activity: Yes  Lifestyle  . Physical activity:    Days per week: Not on file    Minutes per session: Not on file  . Stress: Not on file  Relationships  . Social connections:    Talks on phone: Not on file    Gets together: Not on file    Attends religious service: Not on file    Active member of club or organization: Not on file    Attends meetings of clubs or organizations: Not on file    Relationship status: Not on file  . Intimate partner violence:    Fear of current or ex partner: Not on file    Emotionally abused: Not on file    Physically abused: Not on file    Forced sexual activity: Not on file  Other Topics Concern  . Not on file  Social History Narrative   From [place]   Married     PHYSICAL EXAM:  VS: BP (!) 141/76   Pulse 96   Ht 6' (1.829 m)   SpO2 98%   BMI 53.57 kg/m  Physical Exam Gen: NAD, alert, cooperative with exam, well-appearing ENT: normal lips, normal nasal mucosa,  Eye: normal EOM, normal conjunctiva and lids CV:  no edema, +2 pedal pulses   Resp: no accessory muscle use, non-labored,  Skin: no rashes, no areas of induration  Neuro: normal tone, normal sensation to touch Psych:  normal insight, alert and oriented MSK:  Right and left knee:  Mild effusion b/l  Normal ROM  Normal strength  Mild instability with valgus  testing  Negative McMurray's test  Neurovascularly intact    Aspiration/Injection Procedure Note Nile Dear 11/27/1941  Procedure: Injection Indications: right knee pain   Procedure Details Consent: Risks of procedure as well as the alternatives and risks of each were explained to the (patient/caregiver).  Consent for procedure obtained. Time Out: Verified patient identification, verified procedure, site/side was marked, verified correct patient position, special equipment/implants available, medications/allergies/relevent history reviewed, required imaging and test results available.  Performed.  The area was cleaned with iodine and alcohol swabs.    The right knee superiorlateral SPP was injected using 1 cc's of 40 mg Depomedrol and 4 cc's of 1% lidocaine with a 25 1 1/2" needle.  Ultrasound was used. Images were obtained in  Long views showing the injection.    A sterile dressing was applied.  Patient did tolerate procedure well.   Aspiration/Injection Procedure Note Nile Dear 11/27/1941  Procedure: Injection Indications: left knee pain   Procedure Details Consent: Risks of procedure as well as the alternatives and risks of each were explained to the (patient/caregiver).  Consent for procedure obtained. Time Out: Verified patient identification, verified procedure, site/side was marked, verified correct patient position, special equipment/implants available, medications/allergies/relevent history reviewed, required imaging and test results available.  Performed.  The area was cleaned with iodine and alcohol swabs.    The left knee superiorlateral SPP was injected using 1 cc's of 40 mg Depomedrol and 4 cc's of 1% lidocaine with a 25 1 1/2" needle.  Ultrasound was used. Images were obtained in Long views showing the injection.    A sterile dressing was applied.  Patient did tolerate procedure well.     ASSESSMENT & PLAN:   OA (osteoarthritis) of knee Acute on  chronic pain. Similar to prior episodes  - b/l injections today  - counseled on supportive care - he wants to hold off on gel injections at this time.

## 2018-03-04 NOTE — Patient Instructions (Signed)
Good to see you  Please continue to ice your knees  Please let us know if you want to try gel injections.

## 2018-03-04 NOTE — Assessment & Plan Note (Signed)
Acute on chronic pain. Similar to prior episodes  - b/l injections today  - counseled on supportive care - he wants to hold off on gel injections at this time.

## 2018-03-07 ENCOUNTER — Ambulatory Visit: Payer: BC Managed Care – PPO | Admitting: Physician Assistant

## 2018-03-07 ENCOUNTER — Encounter: Payer: Self-pay | Admitting: Physician Assistant

## 2018-03-07 VITALS — BP 124/62 | HR 68 | Ht 71.0 in | Wt 389.2 lb

## 2018-03-07 DIAGNOSIS — Z01818 Encounter for other preprocedural examination: Secondary | ICD-10-CM

## 2018-03-07 DIAGNOSIS — Z86010 Personal history of colonic polyps: Secondary | ICD-10-CM

## 2018-03-07 NOTE — Patient Instructions (Signed)

## 2018-03-07 NOTE — Progress Notes (Signed)
Chief Complaint: Preprocedural visit for colonoscopy  HPI:    Mr. Glenn Lyons this is a 62 year old male with a past medical history as listed below including morbid obesity, known to Dr. Arelia Longest, presents to clinic today for preprocedural visit for colonoscopy.    10/05/2014 colonoscopy with a sessile polyp in the transverse colon, innumerable number of flat polyps ranging 1-4 mm in size in the sigmoid colon and rectum, moderate diverticulosis in the sigmoid colon otherwise normal.  Pathology revealed distal sessile serrated polyps and repeat was recommended in 3 years.    Today, presents to clinic and explains that he is doing well for himself.  Has continued on CPAP at "the highest level" at night and even if he takes a nap.  Denies use of oxygen.  Explains that he is going to try and see if he can possibly get gastric bypass surgery as he also has arthritis in his knees and now new onset diabetes since being seen in our clinic last.  Denies any GI complaints.    Denies fever, chills, weight loss, anorexia, nausea, vomiting, heartburn, reflux, change in bowel habits or abdominal pain.  Past Medical History:  Diagnosis Date  . Achalasia    with prev eval at [facility].  No intervention as of 2012  . Aspiration pneumonia ([place]) 10/05/2014  . Backache, unspecified   . Cardiac pacemaker St. Jude    ERI 2008  . Depressive disorder, not elsewhere classified   . Esophageal reflux   . Morbid obesity ([place])   . Obstructive sleep apnea    biapap settign 25-22  . Osteoarthrosis, unspecified whether generalized or localized, unspecified site   . Osteoarthrosis, unspecified whether generalized or localized, unspecified site   . Presence of permanent cardiac pacemaker    St. Jude- Dr. Caryl Comes follows -device Check 07-06-14  . Sinus bradycardia /pauses   . Stricture and stenosis of esophagus     Past Surgical History:  Procedure Laterality Date  . BALLOON DILATION N/A 10/06/2015   Procedure: BALLOON DILATION;   Surgeon: Mauri Pole, MD;  Location: [facility];  Service: Endoscopy;  Laterality: N/A;  . COLONOSCOPY W/ POLYPECTOMY  11/01/2010  . COLONOSCOPY WITH PROPOFOL N/A 10/05/2014   Procedure: COLONOSCOPY WITH PROPOFOL;  Surgeon: Gatha Mayer, MD;  Location: [facility];  Service: Endoscopy;  Laterality: N/A;  . ESOPHAGEAL DILATION    . ESOPHAGEAL MANOMETRY N/A 08/23/2015   Procedure: ESOPHAGEAL MANOMETRY (EM);  Surgeon: Mauri Pole, MD;  [facility];  Service: Endoscopy;  Laterality: N/A;  . ESOPHAGOGASTRODUODENOSCOPY N/A 10/09/2014   Procedure: ESOPHAGOGASTRODUODENOSCOPY (EGD);  Surgeon: Gatha Mayer, MD;  Location: [facility];  Service: Endoscopy;  Laterality: N/A;  . ESOPHAGOGASTRODUODENOSCOPY (EGD) WITH PROPOFOL N/A 08/23/2015   Procedure: ESOPHAGOGASTRODUODENOSCOPY (EGD) WITH PROPOFOL;  Surgeon: Mauri Pole, MD;  [facility];  Service: Endoscopy;  Laterality: N/A;  . ESOPHAGOGASTRODUODENOSCOPY (EGD) WITH PROPOFOL N/A 10/06/2015   Procedure: ESOPHAGOGASTRODUODENOSCOPY (EGD) WITH PROPOFOL;  Surgeon: Mauri Pole, MD;  Location: [facility];  Service: Endoscopy;  Laterality: N/A;  Rigiflex ballon size 53m-35mm size 45 minute proc,need Fluro Gastografin esophagram 2 hrs post EGD   . PACEMAKER INSERTION      Current Outpatient Medications  Medication Sig Dispense Refill  . acetaminophen (TYLENOL) 325 MG tablet Take 2 tablets (650 mg total) by mouth every 6 (six) hours as needed (headache, shoulder pain). 100 tablet 1  . aspirin 81 MG tablet Take 81 mg by mouth daily.      .Marland Kitchen  atorvastatin (LIPITOR) 10 MG tablet Take 1 tablet (10 mg total) by mouth daily. 90 tablet 3  . Blood Glucose Monitoring Suppl (ONETOUCH VERIO) w/Device KIT Inject 1 kit into the skin every morning. Use to test blood sugar each day before breakfast and 2 hours after a meal for 2 weeks.  Diagnosis:  E11.9  Non-insulin dependent. 1 kit 0  . cetirizine (ZYRTEC) 10 MG tablet Take  10 mg by mouth.    . ciclopirox (PENLAC) 8 % solution APPLY DAILY AT BEDTIME ON SKIN & NAIL OVER PREVIOUS COAT.AFTER 7 DAYS REMOVE WITH ALCOHOL & REPEAT  5  . citalopram (CELEXA) 20 MG tablet TAKE 1 TABLET (20 MG TOTAL) BY MOUTH DAILY. 90 tablet 3  . glipiZIDE (GLUCOTROL) 5 MG tablet TAKE 1 TABLET (5 MG TOTAL) BY MOUTH DAILY BEFORE BREAKFAST. 90 tablet 1  . glucose blood (ONETOUCH VERIO) test strip USE TO TEST BLOOD SUGAR ONCE DAILY OR AS INSTRUCTED 100 each 3  . Lancets (ONETOUCH ULTRASOFT) lancets USE TO TEST BLOOD SUGAR ONCE DAILY OR AS NEEDED 100 each 3  . loratadine (CLARITIN) 10 MG tablet Take 10 mg by mouth daily.    . metFORMIN (GLUCOPHAGE) 500 MG tablet TAKE 1 TABLET (500 MG TOTAL) BY MOUTH 2 (TWO) TIMES DAILY WITH A MEAL. 180 tablet 1  . Multiple Vitamin (MULTIVITAMIN WITH MINERALS) TABS tablet Take 1 tablet by mouth daily.    Marland Kitchen oxymetazoline (AFRIN NASAL SPRAY) 0.05 % nasal spray Place 1 spray into both nostrils 2 (two) times daily. Use only for 3days, then stop 30 mL 0  . saccharomyces boulardii (FLORASTOR) 250 MG capsule Take 1 capsule (250 mg total) by mouth 2 (two) times daily.    . sodium chloride (OCEAN) 0.65 % SOLN nasal spray Place 1 spray into both nostrils as needed for congestion. 15 mL 0  . torsemide (DEMADEX) 20 MG tablet Take 1 tablet (20 mg total) by mouth daily as needed. 90 tablet 0   No current facility-administered medications for this visit.     Allergies as of 03/07/2018 - Review Complete 03/04/2018  Allergen Reaction Noted  . Morphine Other (See Comments)     Family History  Problem Relation Age of Onset  . Heart attack Father   . Heart disease Father   . Cancer Father        multiple myeloma  . Hypertension Father   . Prostate cancer Father        possible dx  . Hypertension Mother   . Dementia Mother   . Colon cancer Neg Hx     Social History   Socioeconomic History  . Marital status: Married    Spouse name: Not on file  . Number of  children: 2  . Years of education: Not on file  . Highest education level: Not on file  Occupational History  . 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 Occupation: singer    Employer: [employer]  Social Needs  . Financial resource strain: Not on file  . Food insecurity:    Worry: Not on file    Inability: Not on file  . Transportation needs:    Medical: Not on file    Non-medical: Not on file  Tobacco Use  . Smoking status: Former Smoker    Packs/day: 1.00    Years: 20.00    Pack years: 20.00    Types: Cigarettes    Last attempt to quit: 05/29/2004    Years since quitting: 13.7  . Smokeless  tobacco: Never Used  Substance and Sexual Activity  . Alcohol use: Yes    Comment: rarely  . Drug use: No  . Sexual activity: Yes  Lifestyle  . Physical activity:    Days per week: Not on file    Minutes per session: Not on file  . Stress: Not on file  Relationships  . Social connections:    Talks on phone: Not on file    Gets together: Not on file    Attends religious service: Not on file    Active member of club or organization: Not on file    Attends meetings of clubs or organizations: Not on file    Relationship status: Not on file  . Intimate partner violence:    Fear of current or ex partner: Not on file    Emotionally abused: Not on file    Physically abused: Not on file    Forced sexual activity: Not on file  Other Topics Concern  . Not on file  Social History Narrative   From [employer]   Married    Review of Systems:    Constitutional: No weight loss, fever or chills Cardiovascular: No chest pain Respiratory: No SOB Gastrointestinal: See HPI and otherwise negative   Physical Exam:  Vital signs: BP 124/62   Pulse 68   Ht 5' 11" (1.803 m)   Wt (!) 389 lb 4 oz (176.6 kg)   BMI 54.29 kg/m    Constitutional:   Pleasant morbidly obese Caucasian male appears to be in NAD, Well developed, Well nourished, alert and cooperative Respiratory: Respirations  even and unlabored. Lungs clear to auscultation bilaterally.   No wheezes, crackles, or rhonchi.  Cardiovascular: Normal S1, S2. No MRG. Regular rate and rhythm. No peripheral edema, cyanosis or pallor.  Gastrointestinal:  Soft, nondistended, nontender. No rebound or guarding. Normal bowel sounds. No appreciable masses or hepatomegaly. Psychiatric:  Demonstrates good judgement and reason without abnormal affect or behaviors.  No recent labs or imaging.  Assessment: 1.  History of adenomatous colon polyps: Last colonoscopy 2016 with recommendations for repeat in 3 years 2.  Morbid obesity  Plan: 1.  Rescheduled patient for a colonoscopy in the hospital with Dr. Carlean Purl due to his elevated BMI and decreased airway.  Did discuss risks,benefits, limitations and alternatives and the patient agrees to proceed. 2.  Patient to follow in clinic per recommendations from Dr. Carlean Purl after time of procedure.  Ellouise Newer, PA-C [facility] 03/07/2018, 1:54 PM  Cc: Tonia Ghent, MD

## 2018-03-12 NOTE — Progress Notes (Signed)
.    Cardiology Office Note Date:  03/12/2018  Patient ID:  Glenn Lyons, Glenn Lyons 1955-11-05, MRN [id number] PCP:  Tonia Ghent, MD  Electrophysiologist: Dr. Caryl Comes GI: Dr. Silverio Decamp   Chief Complaint: Routine in-clinic pacer check  History of Present Illness: Glenn Lyons is a 62 y.o. 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  He comes in today to be seen for Dr. Caryl Comes.  Last saw him May 2018.  At that visist, discussed exercise tolerance limited by knees and obesity, and his OSA had reached the limits of his CPAP.  Had required recurrent esophageal dilation since his last visit with [place].  Discussion on the need for weight reduction and value of weight loss reduction surgery, was provided website information for the weight loss program at [facility].  Planned for an echo, particularly to revisit RV/p.HTN/pressures.  His pacer was reprogrammed rate response to off.  The echo noted LVEF 60-65%, grade II DD (described as extremely technically difficult echo).  Was unable to assess right sided pressure, urged weight loss efforts and continued diuretics.  He feels well, is planned for a colonoscopy 11/26.  He denies any CP or SOB, no dizziness, near syncope or syncope.  Occassionally when in bed usually evenings but has felt in morning, a fleeting irregular heart beat that is on/off.  No associated symptoms, and self resolve.  He is in the process of being evaluated for potential bariatric surgery.  RCRI score: 0.04   DEVICE information:  STJ dual chamber PPM, 05/06/05, Dr. Caryl Comes  Past Medical History:  Diagnosis Date  . Achalasia    with prev eval at [facility].  No intervention as of 2012  . Aspiration pneumonia ([place]) 10/05/2014  . Backache, unspecified   . Cardiac pacemaker St. Jude    ERI 2008  .  Depressive disorder, not elsewhere classified   . Diabetes ([place])   . Esophageal reflux   . Morbid obesity ([place])   . Obstructive sleep apnea    biapap settign 25-22  . Osteoarthrosis, unspecified whether generalized or localized, unspecified site   . Osteoarthrosis, unspecified whether generalized or localized, unspecified site   . Presence of permanent cardiac pacemaker    St. Jude- Dr. Caryl Comes follows -device Check 07-06-14  . Sinus bradycardia /pauses   . Stricture and stenosis of esophagus     Past Surgical History:  Procedure Laterality Date  . BALLOON DILATION N/A 10/06/2015   Procedure: BALLOON DILATION;  Surgeon: Mauri Pole, MD;  Location: [facility];  Service: Endoscopy;  Laterality: N/A;  . COLONOSCOPY W/ POLYPECTOMY  11/01/2010  . COLONOSCOPY WITH PROPOFOL N/A 10/05/2014   Procedure: COLONOSCOPY WITH PROPOFOL;  Surgeon: Gatha Mayer, MD;  Location: [facility];  Service: Endoscopy;  Laterality: N/A;  . ESOPHAGEAL DILATION    . ESOPHAGEAL MANOMETRY N/A 08/23/2015   Procedure: ESOPHAGEAL MANOMETRY (EM);  Surgeon: Mauri Pole, MD;  Location: [facility];  Service: Endoscopy;  Laterality: N/A;  . ESOPHAGOGASTRODUODENOSCOPY N/A 10/09/2014   Procedure: ESOPHAGOGASTRODUODENOSCOPY (EGD);  Surgeon: Gatha Mayer, MD;  Location: [facility];  Service: Endoscopy;  Laterality: N/A;  . ESOPHAGOGASTRODUODENOSCOPY (EGD) WITH PROPOFOL N/A 08/23/2015   Procedure: ESOPHAGOGASTRODUODENOSCOPY (EGD) WITH PROPOFOL;  Surgeon: Mauri Pole, MD;  Location: [facility];  Service: Endoscopy;  Laterality: N/A;  . ESOPHAGOGASTRODUODENOSCOPY (EGD) WITH PROPOFOL N/A 10/06/2015  Procedure: ESOPHAGOGASTRODUODENOSCOPY (EGD) WITH PROPOFOL;  Surgeon: Mauri Pole, MD;  Location: [facility];  Service: Endoscopy;  Laterality: N/A;  Rigiflex ballon size 61m-35mm size 45 minute proc,need Fluro Gastografin esophagram 2 hrs post EGD   . PACEMAKER INSERTION      Current Outpatient  Medications  Medication Sig Dispense Refill  . acetaminophen (TYLENOL) 325 MG tablet Take 2 tablets (650 mg total) by mouth every 6 (six) hours as needed (headache, shoulder pain). 100 tablet 1  . aspirin 81 MG tablet Take 81 mg by mouth daily.      .Marland Kitchenatorvastatin (LIPITOR) 10 MG tablet Take 1 tablet (10 mg total) by mouth daily. 90 tablet 3  . Blood Glucose Monitoring Suppl (ONETOUCH VERIO) w/Device KIT Inject 1 kit into the skin every morning. Use to test blood sugar each day before breakfast and 2 hours after a meal for 2 weeks.  Diagnosis:  E11.9  Non-insulin dependent. 1 kit 0  . cetirizine (ZYRTEC) 10 MG tablet Take 10 mg by mouth.    . ciclopirox (PENLAC) 8 % solution APPLY DAILY AT BEDTIME ON SKIN & NAIL OVER PREVIOUS COAT.AFTER 7 DAYS REMOVE WITH ALCOHOL & REPEAT  5  . citalopram (CELEXA) 20 MG tablet TAKE 1 TABLET (20 MG TOTAL) BY MOUTH DAILY. 90 tablet 3  . glipiZIDE (GLUCOTROL) 5 MG tablet TAKE 1 TABLET (5 MG TOTAL) BY MOUTH DAILY BEFORE BREAKFAST. 90 tablet 1  . glucose blood (ONETOUCH VERIO) test strip USE TO TEST BLOOD SUGAR ONCE DAILY OR AS INSTRUCTED 100 each 3  . Lancets (ONETOUCH ULTRASOFT) lancets USE TO TEST BLOOD SUGAR ONCE DAILY OR AS NEEDED 100 each 3  . loratadine (CLARITIN) 10 MG tablet Take 10 mg by mouth daily.    . metFORMIN (GLUCOPHAGE) 500 MG tablet TAKE 1 TABLET (500 MG TOTAL) BY MOUTH 2 (TWO) TIMES DAILY WITH A MEAL. 180 tablet 1  . Multiple Vitamin (MULTIVITAMIN WITH MINERALS) TABS tablet Take 1 tablet by mouth daily.    .Marland Kitchenoxymetazoline (AFRIN NASAL SPRAY) 0.05 % nasal spray Place 1 spray into both nostrils 2 (two) times daily. Use only for 3days, then stop 30 mL 0  . saccharomyces boulardii (FLORASTOR) 250 MG capsule Take 1 capsule (250 mg total) by mouth 2 (two) times daily.    . sodium chloride (OCEAN) 0.65 % SOLN nasal spray Place 1 spray into both nostrils as needed for congestion. 15 mL 0  . torsemide (DEMADEX) 20 MG tablet Take 1 tablet (20 mg total) by  mouth daily as needed. 90 tablet 0   No current facility-administered medications for this visit.     Allergies:   Morphine   Social History:  The patient  reports that he quit smoking about 13 years ago. His smoking use included cigarettes. He has a 20.00 pack-year smoking history. He has never used smokeless tobacco. He reports that he drinks alcohol. He reports that he does not use drugs.   Family History:  The patient's family history includes Cancer in his father; Dementia in his mother; Heart attack in his father; Heart disease in his father; Hypertension in his father and mother; Prostate cancer in his father.  ROS:  Please see the history of present illness.  All other systems are reviewed and otherwise negative.   PHYSICAL EXAM:  VS:  There were no vitals taken for this visit. BMI: There is no height or weight on file to calculate BMI. Very pleasent, morbidly obese WM, in no acute distress  HEENT:  normocephalic, atraumatic  Neck: no JVD, carotid bruits or masses Cardiac:  RRR; no significant murmurs, no rubs, or gallops Lungs:  CTA b/l, no wheezing, rhonchi or rales  Abd: soft, nontender MS: no deformity or atrophy Ext:  trace edema the patient reports as chronic, wearing support stockings Skin: warm and dry, no rash Neuro:  No gross deficits appreciated Psych: euthymic mood, full affect  PPM site (right chest) is stable, no tethering or discomfort   EKG:  Done today shows SR, 62bpm, LAD, unchanged from prior PPM check today: reviewed by myself: battery reached ERI   02/02/18, voltage has rebounded some to 2.72V today, lead measurements are stable, no AMS 03/13/17: TTE Study Conclusions - Left ventricle: The cavity size was normal. Wall thickness was   increased in a pattern of moderate LVH. Systolic function was   normal. The estimated ejection fraction was in the range of 60%   to 65%. Features are consistent with a pseudonormal left   ventricular filling pattern,  with concomitant abnormal relaxation   and increased filling pressure (grade 2 diastolic dysfunction). - Right atrium: The atrium was mildly dilated. Impressions: - Extremely technically difficult echo .  Recent Labs: 07/31/2017: ALT 17; BUN 14; Creatinine, Ser 0.89; Potassium 4.5; Sodium 136  07/31/2017: Cholesterol 174; Direct LDL 111.0; HDL 31.70; Total CHOL/HDL Ratio 5; Triglycerides 266.0; VLDL 53.2   CrCl cannot be calculated (Patient's most recent lab result is older than the maximum 21 days allowed.).   Wt Readings from Last 3 Encounters:  03/07/18 (!) 389 lb 4 oz (176.6 kg)  02/14/18 (!) 395 lb (179.2 kg)  02/05/18 (!) 392 lb (177.8 kg)     Other studies reviewed: Additional studies/records reviewed today include: summarized above      ASSESSMENT AND PLAN:  1. Bradycardia, PPM     Battery reached ERI 02/02/18, will plan for gen change     Generator change procedure, potential risks and benefits were discussed with the patient today, he would like to proceed.  2. Dependent LE edema, morbid obesity     reported as chronic and unchanged for many years     On demadex      3. Pending colonoscopy     No cardiac contraindications to planned colonoscopy    Disposition:plan for gen change and routine follow, this is scheduled prior to his planned colonoscopy late next month  Current medicines are reviewed at length with the patient today.  The patient did not have any concerns regarding medicines.  Haywood Lasso, PA-C 03/12/2018 9:44 AM     [address] [address] [phone] (office)  [phone] (fax)

## 2018-03-13 ENCOUNTER — Ambulatory Visit (INDEPENDENT_AMBULATORY_CARE_PROVIDER_SITE_OTHER): Payer: BC Managed Care – PPO | Admitting: Physician Assistant

## 2018-03-13 ENCOUNTER — Encounter: Payer: Self-pay | Admitting: *Deleted

## 2018-03-13 ENCOUNTER — Ambulatory Visit: Payer: BC Managed Care – PPO | Admitting: Physician Assistant

## 2018-03-13 VITALS — BP 114/76 | HR 62 | Ht 71.0 in | Wt 388.0 lb

## 2018-03-13 DIAGNOSIS — Z45010 Encounter for checking and testing of cardiac pacemaker pulse generator [battery]: Secondary | ICD-10-CM | POA: Diagnosis not present

## 2018-03-13 DIAGNOSIS — Z950 Presence of cardiac pacemaker: Secondary | ICD-10-CM | POA: Diagnosis not present

## 2018-03-13 DIAGNOSIS — R609 Edema, unspecified: Secondary | ICD-10-CM | POA: Diagnosis not present

## 2018-03-13 DIAGNOSIS — Z01818 Encounter for other preprocedural examination: Secondary | ICD-10-CM | POA: Diagnosis not present

## 2018-03-13 NOTE — Patient Instructions (Addendum)
Medication Instructions:   Your physician recommends that you continue on your current medications as directed. Please refer to the Current Medication list given to you today.   If you need a refill on your cardiac medications before your next appointment, please call your pharmacy.  Labwork: RETURN FOR CBC BMET  A WEEK BEFORE 04-19-18    Testing/Procedures:  SEE LETTER FOR DEVICE CHANGEOUT    Follow-Up: after 04-19-18    10 TO [place] WITH DEVICE CLINIC     [address] WITH DR Caryl Comes '  Any Other Special Instructions Will Be Listed Below (If Applicable).

## 2018-03-15 ENCOUNTER — Telehealth: Payer: Self-pay | Admitting: Internal Medicine

## 2018-03-15 ENCOUNTER — Encounter: Payer: Self-pay | Admitting: Family Medicine

## 2018-03-15 ENCOUNTER — Other Ambulatory Visit: Payer: BC Managed Care – PPO | Admitting: *Deleted

## 2018-03-15 DIAGNOSIS — Z01818 Encounter for other preprocedural examination: Secondary | ICD-10-CM

## 2018-03-15 NOTE — Telephone Encounter (Signed)
Spoke to patient he is having the battery in his pacemaker changed the same time colonoscopy was scheduled. He would like to push the date of the procedure out to maybe the first week in December. I let patient know that I do not have Dr. Celesta Aver schedule for the hospital for December yet and I will have to call him back.

## 2018-03-15 NOTE — Telephone Encounter (Signed)
Pt needs to r/s colon scheduled at [facility] on 04/23/18. He needs the first week of December.

## 2018-03-15 NOTE — Telephone Encounter (Signed)
New message     Pt is due to have a [employer] on 04-19-18.  He states that he will be out of town working from 03-18-18 until 04-28-18.  Pt need to resc his defib changeout, wound check and 91 day phy follow up.  Please call him as soon as you can to reschedule.

## 2018-03-16 LAB — CBC
Hematocrit: 41.1 % (ref 37.5–51.0)
Hemoglobin: 13.9 g/dL (ref 13.0–17.7)
MCH: 30 pg (ref 26.6–33.0)
MCHC: 33.8 g/dL (ref 31.5–35.7)
MCV: 89 fL (ref 79–97)
Platelets: 231 10*3/uL (ref 150–450)
RBC: 4.63 x10E6/uL (ref 4.14–5.80)
RDW: 14.1 % (ref 12.3–15.4)
WBC: 9.7 10*3/uL (ref 3.4–10.8)

## 2018-03-16 LAB — BASIC METABOLIC PANEL
BUN/Creatinine Ratio: 12 (ref 10–24)
BUN: 12 mg/dL (ref 8–27)
CO2: 22 mmol/L (ref 20–29)
Calcium: 10.3 mg/dL — ABNORMAL HIGH (ref 8.6–10.2)
Chloride: 96 mmol/L (ref 96–106)
Creatinine, Ser: 1.02 mg/dL (ref 0.76–1.27)
GFR calc Af Amer: 92 mL/min/{1.73_m2} (ref >59)
GFR calc non Af Amer: 80 mL/min/{1.73_m2} (ref >59)
Glucose: 247 mg/dL — ABNORMAL HIGH (ref 65–99)
Potassium: 4.2 mmol/L (ref 3.5–5.2)
Sodium: 138 mmol/L (ref 134–144)

## 2018-03-19 NOTE — Telephone Encounter (Signed)
Pt aware his EOL date is May 04, 2018. Pt rescheduled his PPM change out for Dec 4. I will contact pt last week of November to discuss pre procedural instructions, labs, and follow up appointments.

## 2018-03-21 ENCOUNTER — Telehealth: Payer: Self-pay | Admitting: Internal Medicine

## 2018-03-21 DIAGNOSIS — Z86010 Personal history of colonic polyps: Secondary | ICD-10-CM

## 2018-03-21 MED ORDER — PEG 3350-KCL-NABCB-NACL-NASULF 236 G PO SOLR: 4000.0000 mL | Freq: Once | ORAL | 0 refills | Status: AC

## 2018-03-21 NOTE — Telephone Encounter (Signed)
Patient scheduled for 05/06/18 per patients preference with Dr. Rush Landmark at [facility]. Patient made aware and will send him updated instructions in the mail.

## 2018-03-21 NOTE — Addendum Note (Signed)
Addended by: Wyline Beady on: 03/21/2018 04:53 PM   Modules accepted: Orders

## 2018-03-21 NOTE — Telephone Encounter (Signed)
Yes, please see if anyone else has availability.  Thanks-JLL

## 2018-03-21 NOTE — Telephone Encounter (Signed)
Patient is calling back to schedule colon at hospital Dr. Carlean Purl does not have anymore spots for 2019. Should I schedule him with another physician?

## 2018-03-21 NOTE — Telephone Encounter (Signed)
Pt calling regarding needing to schedule colon at hosp

## 2018-03-25 ENCOUNTER — Telehealth: Payer: Self-pay

## 2018-03-25 NOTE — Telephone Encounter (Signed)
Pt aware his procedural time for his device change out has been changed for an arrival at the hospital at 8:30am. Pt has verbalized understanding, had no additional questions.

## 2018-03-29 ENCOUNTER — Ambulatory Visit: Payer: BC Managed Care – PPO | Admitting: Nurse Practitioner

## 2018-03-29 ENCOUNTER — Encounter: Payer: BC Managed Care – PPO | Admitting: Internal Medicine

## 2018-03-29 ENCOUNTER — Encounter: Payer: Self-pay | Admitting: Nurse Practitioner

## 2018-03-29 VITALS — BP 124/72 | HR 66 | Temp 97.9°F | Ht 71.0 in | Wt 392.0 lb

## 2018-03-29 DIAGNOSIS — E119 Type 2 diabetes mellitus without complications: Secondary | ICD-10-CM

## 2018-03-29 DIAGNOSIS — I872 Venous insufficiency (chronic) (peripheral): Secondary | ICD-10-CM

## 2018-03-29 DIAGNOSIS — L853 Xerosis cutis: Secondary | ICD-10-CM | POA: Diagnosis not present

## 2018-03-29 DIAGNOSIS — R600 Localized edema: Secondary | ICD-10-CM | POA: Diagnosis not present

## 2018-03-29 LAB — GLUCOSE, POCT (MANUAL RESULT ENTRY): POC Glucose: 204 mg/dl — AB (ref 70–99)

## 2018-03-29 MED ORDER — TORSEMIDE 20 MG PO TABS: 30.0000 mg | ORAL_TABLET | Freq: Every day | ORAL | 0 refills | Status: DC

## 2018-03-29 MED ORDER — CEPHALEXIN 500 MG PO CAPS: 500.0000 mg | ORAL_CAPSULE | Freq: Three times a day (TID) | ORAL | 0 refills | Status: DC

## 2018-03-29 MED ORDER — MUPIROCIN 2 % EX OINT: 1.0000 "application " | TOPICAL_OINTMENT | Freq: Two times a day (BID) | CUTANEOUS | 0 refills | Status: DC

## 2018-03-29 MED ORDER — CERAVE EX CREA: 1.0000 "application " | TOPICAL_CREAM | Freq: Two times a day (BID) | CUTANEOUS | 0 refills | Status: DC

## 2018-03-29 NOTE — Patient Instructions (Addendum)
Use bactroban on legs x 1week. Start oral abx if no improvement in 1week  Use cerave cream for mosturizing.  Take torsemide 1.5tabs once a day x 3days, then resume 1tab daily as previously prescribed.

## 2018-03-29 NOTE — Progress Notes (Signed)
Subjective:  Patient ID: Glenn Lyons, male    DOB: 25-May-1956  Age: 62 y.o. MRN: [id number]  CC: Rash (pt is complaining of red spots on legs,ithcy,leg tight,swelling,sleepy/ going on 5 days. had hx of cellulitis. )  Rash  This is a new problem. The current episode started in the past 7 days. The problem has been gradually worsening since onset. The affected locations include the right lower leg and left lower leg. The rash is characterized by itchiness, redness and burning. He was exposed to nothing. Pertinent negatives include no fever, joint pain or shortness of breath. Past treatments include topical steroids. The treatment provided mild relief.   Reviewed past Medical, Social and Family history today.  Outpatient Medications Prior to Visit  Medication Sig Dispense Refill  . acetaminophen (TYLENOL) 325 MG tablet Take 2 tablets (650 mg total) by mouth every 6 (six) hours as needed (headache, shoulder pain). 100 tablet 1  . aspirin 81 MG tablet Take 81 mg by mouth daily.      Marland Kitchen atorvastatin (LIPITOR) 10 MG tablet Take 1 tablet (10 mg total) by mouth daily. 90 tablet 3  . Blood Glucose Monitoring Suppl (ONETOUCH VERIO) w/Device KIT Inject 1 kit into the skin every morning. Use to test blood sugar each day before breakfast and 2 hours after a meal for 2 weeks.  Diagnosis:  E11.9  Non-insulin dependent. 1 kit 0  . cetirizine (ZYRTEC) 10 MG tablet Take 10 mg by mouth.    . ciclopirox (PENLAC) 8 % solution APPLY DAILY AT BEDTIME ON SKIN & NAIL OVER PREVIOUS COAT.AFTER 7 DAYS REMOVE WITH ALCOHOL & REPEAT  5  . citalopram (CELEXA) 20 MG tablet TAKE 1 TABLET (20 MG TOTAL) BY MOUTH DAILY. 90 tablet 3  . glipiZIDE (GLUCOTROL) 5 MG tablet TAKE 1 TABLET (5 MG TOTAL) BY MOUTH DAILY BEFORE BREAKFAST. 90 tablet 1  . glucose blood (ONETOUCH VERIO) test strip USE TO TEST BLOOD SUGAR ONCE DAILY OR AS INSTRUCTED 100 each 3  . Lancets (ONETOUCH ULTRASOFT) lancets USE TO TEST BLOOD SUGAR ONCE DAILY OR AS NEEDED  100 each 3  . loratadine (CLARITIN) 10 MG tablet Take 10 mg by mouth daily.    . metFORMIN (GLUCOPHAGE) 500 MG tablet TAKE 1 TABLET (500 MG TOTAL) BY MOUTH 2 (TWO) TIMES DAILY WITH A MEAL. 180 tablet 1  . Multiple Vitamin (MULTIVITAMIN WITH MINERALS) TABS tablet Take 1 tablet by mouth daily.    Marland Kitchen oxymetazoline (AFRIN NASAL SPRAY) 0.05 % nasal spray Place 1 spray into both nostrils 2 (two) times daily. Use only for 3days, then stop 30 mL 0  . saccharomyces boulardii (FLORASTOR) 250 MG capsule Take 1 capsule (250 mg total) by mouth 2 (two) times daily.    . sodium chloride (OCEAN) 0.65 % SOLN nasal spray Place 1 spray into both nostrils as needed for congestion. 15 mL 0  . torsemide (DEMADEX) 20 MG tablet Take 1 tablet (20 mg total) by mouth daily as needed. 90 tablet 0   No facility-administered medications prior to visit.     ROS See HPI  Objective:  BP 124/72   Pulse 66   Temp 97.9 F (36.6 C) (Oral)   Ht '5\' 11"'$  (1.803 m)   Wt (!) 392 lb (177.8 kg)   SpO2 99%   BMI 54.67 kg/m   BP Readings from Last 3 Encounters:  03/29/18 124/72  03/13/18 114/76  03/07/18 124/62    Wt Readings from Last 3 Encounters:  03/29/18 Marland Kitchen)  392 lb (177.8 kg)  03/13/18 (!) 388 lb (176 kg)  03/07/18 (!) 389 lb 4 oz (176.6 kg)    Physical Exam  Constitutional: He is oriented to person, place, and time. He appears well-developed and well-nourished.  Cardiovascular: Normal rate and regular rhythm.  Musculoskeletal: He exhibits edema and tenderness.  Neurological: He is alert and oriented to person, place, and time.  Skin: There is erythema.  Vitals reviewed.   Lab Results  Component Value Date   WBC 9.7 03/15/2018   HGB 13.9 03/15/2018   HCT 41.1 03/15/2018   PLT 231 03/15/2018   GLUCOSE 247 (H) 03/15/2018   CHOL 174 07/31/2017   TRIG 266.0 (H) 07/31/2017   HDL 31.70 (L) 07/31/2017   LDLDIRECT 111.0 07/31/2017   ALT 17 07/31/2017   AST 12 07/31/2017   NA 138 03/15/2018   K 4.2  03/15/2018   CL 96 03/15/2018   CREATININE 1.02 03/15/2018   BUN 12 03/15/2018   CO2 22 03/15/2018   TSH 1.42 08/03/2014   PSA 0.76 07/31/2017   HGBA1C 9.2 (A) 02/05/2018   MICROALBUR <0.7 07/31/2017    Dg Knee Complete 4 Views Right  Result Date: 12/20/2017 CLINICAL DATA:  Chronic right knee pain.  No known injury. EXAM: RIGHT KNEE - COMPLETE 4+ VIEW COMPARISON:  None. FINDINGS: No acute bony or joint abnormality is identified. Small osteophytes are seen about the knee. Medial compartment joint space narrowing is noted. No effusion. No focal bony lesion. IMPRESSION: No acute abnormality. Mild to moderate osteoarthritis most notable in the medial compartment. Electronically Signed   By: Inge Rise M.D.   On: 12/20/2017 15:58    Assessment & Plan:   Glenn Lyons was seen today for rash.  Diagnoses and all orders for this visit:  Stasis dermatitis of both legs -     mupirocin ointment (BACTROBAN) 2 %; Place 1 application into the nose 2 (two) times daily. On bilateral lower extremities -     cephALEXin (KEFLEX) 500 MG capsule; Take 1 capsule (500 mg total) by mouth 3 (three) times daily.  Lower extremity edema -     torsemide (DEMADEX) 20 MG tablet; Take 1.5 tablets (30 mg total) by mouth daily for 4 days.  Dry skin -     mupirocin ointment (BACTROBAN) 2 %; Place 1 application into the nose 2 (two) times daily. On bilateral lower extremities -     Emollient (CERAVE) CREA; Apply 1 application topically 2 (two) times daily.  Diabetes mellitus without complication (Glenn Lyons) -     POCT Glucose (CBG)   I have changed Glenn Lyons's torsemide. I am also having him start on mupirocin ointment, cephALEXin, and CERAVE. Additionally, I am having him maintain his aspirin, multivitamin with minerals, loratadine, acetaminophen, ciclopirox, ONETOUCH VERIO, cetirizine, glucose blood, onetouch ultrasoft, oxymetazoline, sodium chloride, saccharomyces boulardii, atorvastatin, citalopram, glipiZIDE, and  metFORMIN.  Meds ordered this encounter  Medications  . mupirocin ointment (BACTROBAN) 2 %    Sig: Place 1 application into the nose 2 (two) times daily. On bilateral lower extremities    Dispense:  30 g    Refill:  0    Order Specific Question:   Supervising Provider    Answer:   MATTHEWS, CODY [[id number]]  . cephALEXin (KEFLEX) 500 MG capsule    Sig: Take 1 capsule (500 mg total) by mouth 3 (three) times daily.    Dispense:  21 capsule    Refill:  0    Order Specific Question:  Supervising Provider    Answer:   MATTHEWS, CODY [[id number]]  . Emollient (CERAVE) CREA    Sig: Apply 1 application topically 2 (two) times daily.    Refill:  0    Order Specific Question:   Supervising Provider    Answer:   MATTHEWS, CODY [[id number]]  . torsemide (DEMADEX) 20 MG tablet    Sig: Take 1.5 tablets (30 mg total) by mouth daily for 4 days.    Dispense:  6 tablet    Refill:  0    Order Specific Question:   Supervising Provider    Answer:   MATTHEWS, CODY [[id number]]    Follow-up: No follow-ups on file.  Wilfred Lacy, NP

## 2018-04-01 ENCOUNTER — Other Ambulatory Visit: Payer: Self-pay | Admitting: Family Medicine

## 2018-04-01 ENCOUNTER — Encounter: Payer: Self-pay | Admitting: Nurse Practitioner

## 2018-04-02 ENCOUNTER — Telehealth: Payer: Self-pay | Admitting: Physician Assistant

## 2018-04-02 ENCOUNTER — Telehealth: Payer: Self-pay | Admitting: Pulmonary Disease

## 2018-04-02 NOTE — Telephone Encounter (Signed)
Patient had prepopik with 2016.  Patient's wife is concerned that he is not able to drink the prep due to his achalasia.  I offered to change preps to another, but explained that he will still need to drink the same volume.  She understands and will speak with her husband.  She will call back if they do want to change preps.

## 2018-04-02 NOTE — Telephone Encounter (Signed)
Attempted to call patient today regarding cpap machine. I did not receive an answer at time of call. I have left a voicemail message for pt to return call. X1  pt needs a new cpap machine. pt said that his is old.

## 2018-04-02 NOTE — Telephone Encounter (Signed)
Patients wife states pt is scheduled for a procedure at [facility] on 12.9.19. Patient was given regular prep, but pt wife states when he had this done in 2016 he was given something else due to him not being able to swallow. Patient wife wanting to know if patient could be prescribed that other prep again. Pt last seen by APP Anderson Malta on 10.10.19.

## 2018-04-02 NOTE — Telephone Encounter (Signed)
Called and spoke to pt's spouse, Arbie Cookey.  Arbie Cookey attempted to three way pt to schedule OV, as pt was last seen in 2017. Call was disconnected, I attempted to call carol back x3 and received busy signal.  Will call back.

## 2018-04-02 NOTE — Telephone Encounter (Signed)
Patient returned phone call # [phone]

## 2018-04-03 ENCOUNTER — Telehealth: Payer: Self-pay | Admitting: Family Medicine

## 2018-04-03 NOTE — Telephone Encounter (Signed)
Attempted to call patient today regarding cpap machine. I did not receive an answer at time of call. I have left a voicemail message forptto return call. X2  pt needs a new cpap machine. pt said that his is old.

## 2018-04-03 NOTE — Telephone Encounter (Signed)
Spoke to patient and he is in [place] this week. Patient stated that he has not been checking his blood sugar. Patient stated that he went to a [place] office last week because he had a flare-up with cellulitis and they checked his blood sugar and it was 204 in the pm. Patient stated that he will be home Sunday and will start checking his blood sugar more on a regular basis. Patient stated that he will be back in [place] next week, but advised that he will take his supplies with him and will start to monitor his blood sugar better.

## 2018-04-03 NOTE — Telephone Encounter (Signed)
Thanks.  I will await update from the patient about his sugar checks at home. He will need a diabetes visit here with A1c done at the visit sometime after December 10.  Thanks.

## 2018-04-03 NOTE — Telephone Encounter (Signed)
Patient notified as instructed by telephone and verbalized understanding. Follow-up appointment scheduled as instructed. 

## 2018-04-03 NOTE — Telephone Encounter (Signed)
Check with patient.  How is his sugar running recently?  I need to know so we can make plans about his f/u here and about his meds in the meantime.  Thanks.

## 2018-04-04 NOTE — Telephone Encounter (Signed)
ATC pt, no answer. Left message for pt to call back.  

## 2018-04-04 NOTE — Telephone Encounter (Signed)
Spoke with patient wife, patient has an appt Monday at 9:15 to address cpap needs. Nothing further is needed at this time.

## 2018-04-08 ENCOUNTER — Other Ambulatory Visit: Payer: BC Managed Care – PPO

## 2018-04-08 ENCOUNTER — Ambulatory Visit: Payer: BC Managed Care – PPO | Admitting: Nurse Practitioner

## 2018-04-08 ENCOUNTER — Encounter: Payer: Self-pay | Admitting: Nurse Practitioner

## 2018-04-08 VITALS — BP 132/70 | HR 67 | Ht 71.0 in | Wt 393.4 lb

## 2018-04-08 DIAGNOSIS — Z6841 Body Mass Index (BMI) 40.0 and over, adult: Secondary | ICD-10-CM | POA: Diagnosis not present

## 2018-04-08 DIAGNOSIS — G4733 Obstructive sleep apnea (adult) (pediatric): Secondary | ICD-10-CM

## 2018-04-08 NOTE — Patient Instructions (Addendum)
Please order new BiPap (current BiPAP is more 60 years old) from [facility] Patient continues to benefit from CPAP with good compliance and control documented Continue Bipap at current settings 25/21 cm H2O Continue current medications Goal of 4 hours or more usage per night Maintain healthy weight Do not drive if drowsy Follow up with Dr. Halford Chessman in 1 month or sooner if needed

## 2018-04-08 NOTE — Progress Notes (Signed)
Reviewed and agree with assessment/plan.   Angelyna Henderson, MD [facility] 05/24/2016, 12:24 PM Pager:  [phone]  

## 2018-04-08 NOTE — Assessment & Plan Note (Signed)
Patient Instructions  Please order new BiPap (current BiPAP is more 60 years old) from [facility] Patient continues to benefit from CPAP with good compliance and control documented Continue Bipap at current settings 25/21 cm H2O Continue current medications Goal of 4 hours or more usage per night Maintain healthy weight Do not drive if drowsy Follow up with Dr. Halford Chessman in 1 month or sooner if needed

## 2018-04-08 NOTE — Assessment & Plan Note (Signed)
Please work on healthy weight

## 2018-04-08 NOTE — Progress Notes (Signed)
_0  ID: Glenn Lyons, male    DOB: 1956/01/06, 62 y.o.   MRN: [id number]  Chief Complaint  Patient presents with  . Follow-up    Referring provider: Tonia Ghent, MD  HPI  62 year old male with OSA followed by Dr. Halford Chessman  Tests: PSG 2007 >> AHI 107 BiPAP 02/05/16 to 03/05/16 >> used on 30 of 30 nights with average 7 hrs 33 min.  Average AHI 1.9 with BiPAP 25/21 cm H2O  OV 04/08/18 - follow up OSA Patient presents for a follow up on OSA. Patient states that he is compliant with BiPAP. States that he benefits from it. Feels much less drowsy throughout the day. He is able to tolerate high pressures with out difficulty. He states that his current machine is more than five years old and would like to get a new machine. He denies any shortness of breath, chest pain, or edema. I have requested a download compliance report for today.     Allergies  Allergen Reactions  . Morphine Other (See Comments)    REACTION: Heart stopped.    Immunization History  Administered Date(s) Administered  . Influenza Inj Mdck Quad Pf 02/26/2018  . Influenza Split 02/20/2012  . Influenza,inj,Quad PF,6+ Mos 03/10/2013, 05/19/2015, 03/21/2017, 03/08/2018  . Influenza-Unspecified 05/12/2016, 01/04/2018  . Pneumococcal Polysaccharide-23 07/11/2016  . Tdap 07/11/2016  . Zoster 03/18/2015    Past Medical History:  Diagnosis Date  . Achalasia    with prev eval at [facility].  No intervention as of 2012  . Aspiration pneumonia ([place]) 10/05/2014  . Backache, unspecified   . Cardiac pacemaker St. Jude    ERI 2008  . Depressive disorder, not elsewhere classified   . Diabetes ([place])   . Esophageal reflux   . Morbid obesity ([place])   . Obstructive sleep apnea    biapap settign 25-22  . Osteoarthrosis, unspecified whether generalized or localized, unspecified site   . Osteoarthrosis, unspecified whether generalized or localized, unspecified site   . Presence of permanent cardiac pacemaker    St. Jude- Dr.  Caryl Comes follows -device Check 07-06-14  . Sinus bradycardia /pauses   . Stricture and stenosis of esophagus     Tobacco History: Social History   Tobacco Use  Smoking Status Former Smoker  . Packs/day: 1.00  . Years: 20.00  . Pack years: 20.00  . Types: Cigarettes  . Last attempt to quit: 05/29/2004  . Years since quitting: 13.8  Smokeless Tobacco Never Used   Counseling given: Yes   Outpatient Encounter Medications as of 04/08/2018  Medication Sig  . acetaminophen (TYLENOL) 325 MG tablet Take 2 tablets (650 mg total) by mouth every 6 (six) hours as needed (headache, shoulder pain).  Marland Kitchen aspirin 81 MG tablet Take 81 mg by mouth daily.    Marland Kitchen atorvastatin (LIPITOR) 10 MG tablet Take 1 tablet (10 mg total) by mouth daily.  . Blood Glucose Monitoring Suppl (ONETOUCH VERIO) w/Device KIT Inject 1 kit into the skin every morning. Use to test blood sugar each day before breakfast and 2 hours after a meal for 2 weeks.  Diagnosis:  E11.9  Non-insulin dependent.  . cephALEXin (KEFLEX) 500 MG capsule Take 1 capsule (500 mg total) by mouth 3 (three) times daily.  . cetirizine (ZYRTEC) 10 MG tablet Take 10 mg by mouth.  . ciclopirox (PENLAC) 8 % solution APPLY DAILY AT BEDTIME ON SKIN & NAIL OVER PREVIOUS COAT.AFTER 7 DAYS REMOVE WITH ALCOHOL & REPEAT  . citalopram (CELEXA) 20 MG  tablet TAKE 1 TABLET (20 MG TOTAL) BY MOUTH DAILY.  Marland Kitchen Emollient (CERAVE) CREA Apply 1 application topically 2 (two) times daily.  Marland Kitchen glipiZIDE (GLUCOTROL) 5 MG tablet TAKE 1 TABLET (5 MG TOTAL) BY MOUTH DAILY BEFORE BREAKFAST.  Marland Kitchen glucose blood (ONETOUCH VERIO) test strip USE TO TEST BLOOD SUGAR ONCE DAILY OR AS INSTRUCTED  . Lancets (ONETOUCH ULTRASOFT) lancets USE TO TEST BLOOD SUGAR ONCE DAILY OR AS NEEDED  . loratadine (CLARITIN) 10 MG tablet Take 10 mg by mouth daily.  . metFORMIN (GLUCOPHAGE) 500 MG tablet TAKE 1 TABLET (500 MG TOTAL) BY MOUTH 2 (TWO) TIMES DAILY WITH A MEAL.  . Multiple Vitamin (MULTIVITAMIN WITH  MINERALS) TABS tablet Take 1 tablet by mouth daily.  . mupirocin ointment (BACTROBAN) 2 % Place 1 application into the nose 2 (two) times daily. On bilateral lower extremities  . oxymetazoline (AFRIN NASAL SPRAY) 0.05 % nasal spray Place 1 spray into both nostrils 2 (two) times daily. Use only for 3days, then stop  . saccharomyces boulardii (FLORASTOR) 250 MG capsule Take 1 capsule (250 mg total) by mouth 2 (two) times daily.  . sodium chloride (OCEAN) 0.65 % SOLN nasal spray Place 1 spray into both nostrils as needed for congestion.  . torsemide (DEMADEX) 20 MG tablet TAKE 1 TABLET BY MOUTH EVERY DAY AS NEEDED  . torsemide (DEMADEX) 20 MG tablet Take 1.5 tablets (30 mg total) by mouth daily for 4 days.   No facility-administered encounter medications on file as of 04/08/2018.      Review of Systems  Review of Systems  Constitutional: Negative.  Negative for chills and fever.  HENT: Negative.   Respiratory: Negative for cough and shortness of breath.   Cardiovascular: Negative.  Negative for chest pain, palpitations and leg swelling.  Gastrointestinal: Negative.   Allergic/Immunologic: Negative.   Neurological: Negative.   Psychiatric/Behavioral: Negative.        Physical Exam  BP 132/70 (BP Location: Left Arm, Patient Position: Sitting, Cuff Size: Large)   Pulse 67   Ht _0  (1.803 m)   Wt (!) 393 lb 6.4 oz (178.4 kg)   SpO2 96%   BMI 54.87 kg/m   Wt Readings from Last 5 Encounters:  04/08/18 (!) 393 lb 6.4 oz (178.4 kg)  03/29/18 (!) 392 lb (177.8 kg)  03/13/18 (!) 388 lb (176 kg)  03/07/18 (!) 389 lb 4 oz (176.6 kg)  02/14/18 (!) 395 lb (179.2 kg)     Physical Exam  Constitutional: He is oriented to person, place, and time. He appears well-developed and well-nourished. No distress.  Cardiovascular: Normal rate and regular rhythm.  Pulmonary/Chest: Effort normal and breath sounds normal. No respiratory distress. He has no wheezes.  Musculoskeletal: He exhibits no  edema.  Neurological: He is alert and oriented to person, place, and time.  Skin: Skin is warm and dry.  Psychiatric: He has a normal mood and affect.  Nursing note and vitals reviewed.     Assessment & Plan:   OSA (obstructive sleep apnea) Patient Instructions  Please order new BiPap (current BiPAP is more 60 years old) [facility] Patient continues to benefit from CPAP with good compliance and control documented Continue Bipap at current settings 25/21 cm H2O Continue current medications Goal of 4 hours or more usage per night Maintain healthy weight Do not drive if drowsy Follow up with Dr. Halford Chessman in 1 month or sooner if needed     Morbid obesity with BMI of 50.0-59.9, adult  Please work on healthy weight     Fenton Foy, NP 04/08/2018

## 2018-04-09 ENCOUNTER — Telehealth: Payer: Self-pay | Admitting: Nurse Practitioner

## 2018-04-09 DIAGNOSIS — G4733 Obstructive sleep apnea (adult) (pediatric): Secondary | ICD-10-CM

## 2018-04-09 NOTE — Telephone Encounter (Signed)
Spoke with Barbaraann Rondo at [facility], he states the order only has one pressure on the order at 25 but needs the second pressure. His last pressure setting was 25/21. Please clarify pressure setting so we can send a new order. Please advise.

## 2018-04-10 NOTE — Telephone Encounter (Signed)
Done

## 2018-04-10 NOTE — Telephone Encounter (Signed)
Continue previous settings. Thanks.

## 2018-04-10 NOTE — Telephone Encounter (Signed)
Ok order placed, PCC's please send to [facility]. Thanks

## 2018-04-19 ENCOUNTER — Other Ambulatory Visit: Payer: BC Managed Care – PPO

## 2018-04-23 ENCOUNTER — Ambulatory Visit (HOSPITAL_COMMUNITY): Admit: 2018-04-23 | Payer: BC Managed Care – PPO | Admitting: Internal Medicine

## 2018-04-23 ENCOUNTER — Encounter (HOSPITAL_COMMUNITY): Payer: Self-pay

## 2018-04-23 SURGERY — COLONOSCOPY WITH PROPOFOL
Anesthesia: Monitor Anesthesia Care

## 2018-04-30 ENCOUNTER — Other Ambulatory Visit: Payer: BC Managed Care – PPO

## 2018-04-30 ENCOUNTER — Telehealth: Payer: Self-pay

## 2018-04-30 DIAGNOSIS — Z950 Presence of cardiac pacemaker: Secondary | ICD-10-CM

## 2018-04-30 DIAGNOSIS — R001 Bradycardia, unspecified: Secondary | ICD-10-CM

## 2018-04-30 NOTE — Telephone Encounter (Signed)
Pt has verbalized understanding of procedure time, labs to be drawn today, and pre procedural diet restrictions. Pt also verbalized understanding of holding all diabetic meds and his torsemide in the morning. Pt had no additional questions.

## 2018-04-30 NOTE — Telephone Encounter (Signed)
-----   Message from Dollene Primrose, RN sent at 03/19/2018  9:09 AM EDT ----- Regarding: Labs and pre procedure instructions Call pt to discuss labs, pre procedure instructions, and follow up appointments.

## 2018-05-01 ENCOUNTER — Other Ambulatory Visit: Payer: Self-pay

## 2018-05-01 ENCOUNTER — Ambulatory Visit (HOSPITAL_COMMUNITY)
Admission: RE | Admit: 2018-05-01 | Discharge: 2018-05-01 | Disposition: A | Discharge status: Home / Self Care | Payer: BC Managed Care – PPO | Source: Ambulatory Visit | Attending: Internal Medicine | Admitting: Internal Medicine

## 2018-05-01 ENCOUNTER — Encounter (HOSPITAL_COMMUNITY)
Admission: RE | Disposition: A | Discharge status: Home / Self Care | Payer: Self-pay | Source: Ambulatory Visit | Attending: Internal Medicine

## 2018-05-01 DIAGNOSIS — Z79899 Other long term (current) drug therapy: Secondary | ICD-10-CM | POA: Insufficient documentation

## 2018-05-01 DIAGNOSIS — Z7982 Long term (current) use of aspirin: Secondary | ICD-10-CM | POA: Diagnosis not present

## 2018-05-01 DIAGNOSIS — Z7984 Long term (current) use of oral hypoglycemic drugs: Secondary | ICD-10-CM | POA: Diagnosis not present

## 2018-05-01 DIAGNOSIS — I5030 Unspecified diastolic (congestive) heart failure: Secondary | ICD-10-CM | POA: Insufficient documentation

## 2018-05-01 DIAGNOSIS — G4733 Obstructive sleep apnea (adult) (pediatric): Secondary | ICD-10-CM | POA: Insufficient documentation

## 2018-05-01 DIAGNOSIS — Z45010 Encounter for checking and testing of cardiac pacemaker pulse generator [battery]: Secondary | ICD-10-CM

## 2018-05-01 DIAGNOSIS — Z8249 Family history of ischemic heart disease and other diseases of the circulatory system: Secondary | ICD-10-CM | POA: Insufficient documentation

## 2018-05-01 DIAGNOSIS — I495 Sick sinus syndrome: Secondary | ICD-10-CM | POA: Diagnosis present

## 2018-05-01 DIAGNOSIS — Z885 Allergy status to narcotic agent status: Secondary | ICD-10-CM | POA: Insufficient documentation

## 2018-05-01 DIAGNOSIS — M1990 Unspecified osteoarthritis, unspecified site: Secondary | ICD-10-CM | POA: Diagnosis not present

## 2018-05-01 DIAGNOSIS — E119 Type 2 diabetes mellitus without complications: Secondary | ICD-10-CM | POA: Diagnosis not present

## 2018-05-01 DIAGNOSIS — Z87891 Personal history of nicotine dependence: Secondary | ICD-10-CM | POA: Diagnosis not present

## 2018-05-01 DIAGNOSIS — Z9889 Other specified postprocedural states: Secondary | ICD-10-CM | POA: Diagnosis not present

## 2018-05-01 DIAGNOSIS — R600 Localized edema: Secondary | ICD-10-CM | POA: Insufficient documentation

## 2018-05-01 DIAGNOSIS — K219 Gastro-esophageal reflux disease without esophagitis: Secondary | ICD-10-CM | POA: Insufficient documentation

## 2018-05-01 DIAGNOSIS — Z6841 Body Mass Index (BMI) 40.0 and over, adult: Secondary | ICD-10-CM | POA: Insufficient documentation

## 2018-05-01 HISTORY — PX: PPM GENERATOR CHANGEOUT: EP1233

## 2018-05-01 LAB — BASIC METABOLIC PANEL
Anion gap: 10 (ref 5–15)
BUN/Creatinine Ratio: 19 (ref 10–24)
BUN: 15 mg/dL (ref 8–27)
BUN: 14 mg/dL (ref 6–20)
CO2: 22 mmol/L (ref 20–29)
CO2: 24 mmol/L (ref 22–32)
Calcium: 9.7 mg/dL (ref 8.6–10.2)
Calcium: 9.5 mg/dL (ref 8.9–10.3)
Chloride: 98 mmol/L (ref 96–106)
Chloride: 103 mmol/L (ref 98–111)
Creatinine, Ser: 0.77 mg/dL (ref 0.76–1.27)
Creatinine, Ser: 0.8 mg/dL (ref 0.61–1.24)
GFR calc Af Amer: 114 mL/min/{1.73_m2} (ref >59)
GFR calc Af Amer: >60 mL/min (ref >60)
GFR calc non Af Amer: 99 mL/min/{1.73_m2} (ref >59)
GFR calc non Af Amer: >60 mL/min (ref >60)
Glucose, Bld: 201 mg/dL — ABNORMAL HIGH (ref 70–99)
Potassium: 3.9 mmol/L (ref 3.5–5.1)
Sodium: 136 mmol/L (ref 134–144)
Sodium: 137 mmol/L (ref 135–145)

## 2018-05-01 LAB — SURGICAL PCR SCREEN
MRSA, PCR: NEGATIVE
Staphylococcus aureus: NEGATIVE

## 2018-05-01 LAB — CBC
Hematocrit: 40.6 % (ref 37.5–51.0)
Hemoglobin: 14 g/dL (ref 13.0–17.7)
MCH: 29.6 pg (ref 26.6–33.0)
MCHC: 34.5 g/dL (ref 31.5–35.7)
MCV: 86 fL (ref 79–97)
Platelets: 277 10*3/uL (ref 150–450)
RBC: 4.73 x10E6/uL (ref 4.14–5.80)
RDW: 13 % (ref 12.3–15.4)
WBC: 8.7 10*3/uL (ref 3.4–10.8)

## 2018-05-01 LAB — GLUCOSE, CAPILLARY: Glucose-Capillary: 210 mg/dL — ABNORMAL HIGH (ref 70–99)

## 2018-05-01 SURGERY — PPM GENERATOR CHANGEOUT

## 2018-05-01 MED ORDER — CHLORHEXIDINE GLUCONATE 4 % EX LIQD
60.0000 mL | Freq: Once | CUTANEOUS | Status: DC
  Filled 2018-05-01: qty 60

## 2018-05-01 MED ORDER — FENTANYL CITRATE (PF) 100 MCG/2ML IJ SOLN
INTRAMUSCULAR | Status: AC
  Filled 2018-05-01: qty 2

## 2018-05-01 MED ORDER — MUPIROCIN 2 % EX OINT
TOPICAL_OINTMENT | CUTANEOUS | Status: AC
  Administered 2018-05-01: 1
  Filled 2018-05-01: qty 22

## 2018-05-01 MED ORDER — MIDAZOLAM HCL 5 MG/5ML IJ SOLN
INTRAMUSCULAR | Status: AC
  Filled 2018-05-01: qty 5

## 2018-05-01 MED ORDER — LIDOCAINE HCL (PF) 1 % IJ SOLN
INTRAMUSCULAR | Status: DC | PRN
  Administered 2018-05-01: 60 mL

## 2018-05-01 MED ORDER — SODIUM CHLORIDE 0.9 % IV SOLN
INTRAVENOUS | Status: DC
  Administered 2018-05-01: 11:00:00 via INTRAVENOUS

## 2018-05-01 MED ORDER — SODIUM CHLORIDE 0.9 % IV SOLN
INTRAVENOUS | Status: AC
  Filled 2018-05-01: qty 2

## 2018-05-01 MED ORDER — ACETAMINOPHEN 325 MG PO TABS
325.0000 mg | ORAL_TABLET | ORAL | Status: DC | PRN
  Filled 2018-05-01: qty 2

## 2018-05-01 MED ORDER — SODIUM CHLORIDE 0.9 % IV SOLN: INTRAVENOUS | Status: DC

## 2018-05-01 MED ORDER — ONDANSETRON HCL 4 MG/2ML IJ SOLN: 4.0000 mg | Freq: Four times a day (QID) | INTRAMUSCULAR | Status: DC | PRN

## 2018-05-01 MED ORDER — CEFAZOLIN SODIUM-DEXTROSE 2-4 GM/100ML-% IV SOLN
INTRAVENOUS | Status: AC
  Filled 2018-05-01: qty 100

## 2018-05-01 MED ORDER — SODIUM CHLORIDE 0.9 % IV SOLN
80.0000 mg | INTRAVENOUS | Status: AC
  Administered 2018-05-01: 80 mg

## 2018-05-01 MED ORDER — DEXTROSE 5 % IV SOLN
3.0000 g | INTRAVENOUS | Status: AC
  Administered 2018-05-01: 3 g via INTRAVENOUS
  Filled 2018-05-01: qty 3

## 2018-05-01 SURGICAL SUPPLY — 5 items
CABLE SURGICAL S-101-97-12 (CABLE) ×2 IMPLANT
HEMOSTAT SURGICEL 2X4 FIBR (HEMOSTASIS) ×2 IMPLANT
PACEMAKER ASSURITY DR-RF (Pacemaker) ×2 IMPLANT
PAD PRO RADIOLUCENT 2001M-C (PAD) ×2 IMPLANT
TRAY PACEMAKER INSERTION (PACKS) ×2 IMPLANT

## 2018-05-01 NOTE — H&P (Signed)
Patient Care Team: Tonia Ghent, MD as PCP - General   HPI  Glenn Lyons is a 62 y.o. male  Admitted for pacemaker generator replacement. This was implanted about 2010 for sinus node dysfunction      EF 60-65%   Being evaluated for bariatric zsurgery   recently started on Bipap at night with marked improvement   Past Medical History:  Diagnosis Date  . Achalasia    with prev eval at [facility].  No intervention as of 2012  . Aspiration pneumonia ([place]) 10/05/2014  . Backache, unspecified   . Cardiac pacemaker St. Jude    ERI 2008  . Depressive disorder, not elsewhere classified   . Diabetes ([place])   . Esophageal reflux   . Morbid obesity ([place])   . Obstructive sleep apnea    biapap settign 25-22  . Osteoarthrosis, unspecified whether generalized or localized, unspecified site   . Osteoarthrosis, unspecified whether generalized or localized, unspecified site   . Presence of permanent cardiac pacemaker    St. Jude- Dr. Caryl Comes follows -device Check 07-06-14  . Sinus bradycardia /pauses   . Stricture and stenosis of esophagus     Past Surgical History:  Procedure Laterality Date  . BALLOON DILATION N/A 10/06/2015   Procedure: BALLOON DILATION;  Surgeon: Mauri Pole, MD;  Location: [facility];  Service: Endoscopy;  Laterality: N/A;  . COLONOSCOPY W/ POLYPECTOMY  11/01/2010  . COLONOSCOPY WITH PROPOFOL N/A 10/05/2014   Procedure: COLONOSCOPY WITH PROPOFOL;  Surgeon: Gatha Mayer, MD;  Location: [facility];  Service: Endoscopy;  Laterality: N/A;  . ESOPHAGEAL DILATION    . ESOPHAGEAL MANOMETRY N/A 08/23/2015   Procedure: ESOPHAGEAL MANOMETRY (EM);  Surgeon: Mauri Pole, MD;  Location: [facility];  Service: Endoscopy;  Laterality: N/A;  . ESOPHAGOGASTRODUODENOSCOPY N/A 10/09/2014   Procedure: ESOPHAGOGASTRODUODENOSCOPY (EGD);  Surgeon: Gatha Mayer, MD;  Location: [facility];  Service: Endoscopy;  Laterality: N/A;  . ESOPHAGOGASTRODUODENOSCOPY (EGD) WITH  PROPOFOL N/A 08/23/2015   Procedure: ESOPHAGOGASTRODUODENOSCOPY (EGD) WITH PROPOFOL;  Surgeon: Mauri Pole, MD;  Location: [facility];  Service: Endoscopy;  Laterality: N/A;  . ESOPHAGOGASTRODUODENOSCOPY (EGD) WITH PROPOFOL N/A 10/06/2015   Procedure: ESOPHAGOGASTRODUODENOSCOPY (EGD) WITH PROPOFOL;  Surgeon: Mauri Pole, MD;  Location: [facility];  Service: Endoscopy;  Laterality: N/A;  Rigiflex ballon size 82m-35mm size 45 minute proc,need Fluro Gastografin esophagram 2 hrs post EGD   . PACEMAKER INSERTION      Current Facility-Administered Medications  Medication Dose Route Frequency Provider Last Rate Last Dose  . 0.9 %  sodium chloride infusion   Intravenous Continuous KDeboraha Sprang MD 50 mL/hr at 05/01/18 1036    . ceFAZolin (ANCEF) 3 g in dextrose 5 % 50 mL IVPB  3 g Intravenous On Call KDeboraha Sprang MD      . chlorhexidine (HIBICLENS) 4 % liquid 4 application  60 mL Topical Once KDeboraha Sprang MD      . gentamicin (GARAMYCIN) 80 mg in sodium chloride 0.9 % 500 mL irrigation  80 mg Irrigation On Call KDeboraha Sprang MD        Allergies  Allergen Reactions  . Morphine Other (See Comments)    Cardiac Arrest      Social History   Tobacco Use  . Smoking status: Former Smoker    Packs/day: 1.00    Years: 20.00    Pack years: 20.00    Types: Cigarettes    Last attempt to quit:  05/29/2004    Years since quitting: 13.9  . Smokeless tobacco: Never Used  Substance Use Topics  . Alcohol use: Yes    Comment: rarely  . Drug use: No     Family History  Problem Relation Age of Onset  . Heart attack Father   . Heart disease Father   . Cancer Father        multiple myeloma  . Hypertension Father   . Prostate cancer Father        possible dx  . Hypertension Mother   . Dementia Mother   . Colon cancer Neg Hx      Current Meds  Medication Sig  . aspirin 81 MG tablet Take 81 mg by mouth daily.    Marland Kitchen atorvastatin (LIPITOR) 10 MG tablet Take 1 tablet  (10 mg total) by mouth daily.  . cetirizine (ZYRTEC) 10 MG tablet Take 10 mg by mouth daily.   . citalopram (CELEXA) 20 MG tablet TAKE 1 TABLET (20 MG TOTAL) BY MOUTH DAILY.  Marland Kitchen glipiZIDE (GLUCOTROL) 5 MG tablet TAKE 1 TABLET (5 MG TOTAL) BY MOUTH DAILY BEFORE BREAKFAST.  Marland Kitchen ibuprofen (ADVIL,MOTRIN) 200 MG tablet Take 400-800 mg by mouth every 6 (six) hours as needed for headache or moderate pain.  . metFORMIN (GLUCOPHAGE) 500 MG tablet TAKE 1 TABLET (500 MG TOTAL) BY MOUTH 2 (TWO) TIMES DAILY WITH A MEAL.  . Multiple Vitamin (MULTIVITAMIN WITH MINERALS) TABS tablet Take 1 tablet by mouth daily.  Marland Kitchen oxymetazoline (AFRIN NASAL SPRAY) 0.05 % nasal spray Place 1 spray into both nostrils 2 (two) times daily. Use only for 3days, then stop (Patient taking differently: Place 1 spray into both nostrils 2 (two) times daily as needed for congestion. )  . torsemide (DEMADEX) 20 MG tablet TAKE 1 TABLET BY MOUTH EVERY DAY AS NEEDED (Patient taking differently: Take 20 mg by mouth daily. )     Review of Systems negative except from HPI and PMH  Physical Exam BP (!) 147/64   Pulse 64   Temp 99 F (37.2 C)   Ht 5' 11.5" (1.816 m)   Wt (!) 176.9 kg   SpO2 99%   BMI 53.64 kg/m  Well developed and well nourished in no acute distress HENT normal E scleral and icterus clear Neck Supple JVP flat; carotids brisk and full Clear to ausculation Device pocket well healed; without hematoma or erythema.  There is no tethering Regular rate and rhythm, no murmurs gallops or rub Soft with active bowel sounds No clubbing cyanosis  Edema Alert and oriented, grossly normal motor and sensory function Skin Warm and Dry    Assessment and  Plan  Sinus node dysfunction  Peripheral edema  Pacemaker-St. Jude   Sleep apnea  Obesity- morbid  HFpEF   We have reviewed the benefits and risks of generator replacement.  These include but are not limited to lead fracture and infection.  The patient understands,  agrees and is willing to proceed.

## 2018-05-01 NOTE — Discharge Instructions (Signed)
Dermabond will come off in next 10-14 days; if not will remove at wound check  Keep wound dry until tomorrow evening No Driving x 4 days Wound check in office as scheduled    Pacemaker Battery Change, Care After This sheet gives you information about how to care for yourself after your procedure. Your health care provider may also give you more specific instructions. If you have problems or questions, contact your health care provider. What can I expect after the procedure? After your procedure, it is common to have:  Pain or soreness at the site where the pacemaker was inserted.  Swelling at the site where the pacemaker was inserted.  Follow these instructions at home: Incision care  Keep the incision clean and dry. ? Do not take baths, swim, or use a hot tub until your health care provider approves. ? You may shower the day after your procedure, or as directed by your health care provider. ? Pat the area dry with a clean towel. Do not rub the area. This may cause bleeding.  Follow instructions from your health care provider about how to take care of your incision. Make sure you: ? Wash your hands with soap and water before you change your bandage (dressing). If soap and water are not available, use hand sanitizer. ? Change your dressing as told by your health care provider. ? Leave stitches (sutures), skin glue, or adhesive strips in place. These skin closures may need to stay in place for 2 weeks or longer. If adhesive strip edges start to loosen and curl up, you may trim the loose edges. Do not remove adhesive strips completely unless your health care provider tells you to do that.  Check your incision area every day for signs of infection. Check for: ? More redness, swelling, or pain. ? More fluid or blood. ? Warmth. ? Pus or a bad smell. Activity  Do not lift anything that is heavier than 10 lb (4.5 kg) until your health care provider says it is okay to do so.  For the  first 2 weeks, or as long as told by your health care provider: ? Avoid lifting your left arm higher than your shoulder. ? Be gentle when you move your arms over your head. It is okay to raise your arm to comb your hair. ? Avoid strenuous exercise.  Ask your health care provider when it is okay to: ? Resume your normal activities. ? Return to work or school. ? Resume sexual activity. Eating and drinking  Eat a heart-healthy diet. This should include plenty of fresh fruits and vegetables, whole grains, low-fat dairy products, and lean protein like chicken and fish.  Limit alcohol intake to no more than 1 drink a day for non-pregnant women and 2 drinks a day for men. One drink equals 12 oz of beer, 5 oz of wine, or 1 oz of hard liquor.  Check ingredients and nutrition facts on packaged foods and beverages. Avoid the following types of food: ? Food that is high in salt (sodium). ? Food that is high in saturated fat, like full-fat dairy or red meat. ? Food that is high in trans fat, like fried food. ? Food and drinks that are high in sugar. Lifestyle  Do not use any products that contain nicotine or tobacco, such as cigarettes and e-cigarettes. If you need help quitting, ask your health care provider.  Take steps to manage and control your weight.  Get regular exercise. Aim for 150 minutes  of moderate-intensity exercise (such as walking or yoga) or 75 minutes of vigorous exercise (such as running or swimming) each week.  Manage other health problems, such as diabetes or high blood pressure. Ask your health care provider how you can manage these conditions. General instructions  Do not drive for 24 hours after your procedure if you were given a medicine to help you relax (sedative).  Take over-the-counter and prescription medicines only as told by your health care provider.  Avoid putting pressure on the area where the pacemaker was placed.  If you need an MRI after your pacemaker  has been placed, be sure to tell the health care provider who orders the MRI that you have a pacemaker.  Avoid close and prolonged exposure to electrical devices that have strong magnetic fields. These include: ? Cell phones. Avoid keeping them in a pocket near the pacemaker, and try using the ear opposite the pacemaker. ? MP3 players. ? Household appliances, like microwaves. ? Metal detectors. ? Electric generators. ? High-tension wires.  Keep all follow-up visits as directed by your health care provider. This is important. Contact a health care provider if:  You have pain at the incision site that is not relieved by over-the-counter or prescription medicines.  You have any of these around your incision site or coming from it: ? More redness, swelling, or pain. ? Fluid or blood. ? Warmth to the touch. ? Pus or a bad smell.  You have a fever.  You feel brief, occasional palpitations, light-headedness, or any symptoms that you think might be related to your heart. Get help right away if:  You experience chest pain that is different from the pain at the pacemaker site.  You develop a red streak that extends above or below the incision site.  You experience shortness of breath.  You have palpitations or an irregular heartbeat.  You have light-headedness that does not go away quickly.  You faint or have dizzy spells.  Your pulse suddenly drops or increases rapidly and does not return to normal.  You begin to gain weight and your legs and ankles swell. Summary  After your procedure, it is common to have pain, soreness, and some swelling where the pacemaker was inserted.  Make sure to keep your incision clean and dry. Follow instructions from your health care provider about how to take care of your incision.  Check your incision every day for signs of infection, such as more pain or swelling, pus or a bad smell, warmth, or leaking fluid and blood.  Avoid strenuous exercise  and lifting your left arm higher than your shoulder for 2 weeks, or as long as told by your health care provider. This information is not intended to replace advice given to you by your health care provider. Make sure you discuss any questions you have with your health care provider. Document Released: 03/05/2013 Document Revised: 04/06/2016 Document Reviewed: 04/06/2016 Elsevier Interactive Patient Education  2017 [employer].

## 2018-05-02 ENCOUNTER — Ambulatory Visit: Payer: BC Managed Care – PPO

## 2018-05-02 ENCOUNTER — Encounter (HOSPITAL_COMMUNITY): Payer: Self-pay | Admitting: Internal Medicine

## 2018-05-03 ENCOUNTER — Other Ambulatory Visit: Payer: Self-pay | Admitting: Emergency Medicine

## 2018-05-03 DIAGNOSIS — Z86010 Personal history of colonic polyps: Secondary | ICD-10-CM

## 2018-05-03 NOTE — Progress Notes (Signed)
Attempted to call pt for pre-op call. Went to [facility]. Left pre-op instructions for pt on his Voicemail. Pt has a pacemaker and his diabetic. Instructed him not to take his oral diabetic meds Monday AM. Did instruct him to check his blood sugar when he gets up Monday AM. If blood sugar is 70 or below, treat with 1/2 cup of clear juice (apple) and recheck blood sugar 15 minutes after drinking juice.    EKG- 03/13/18 Echo - 03/13/17

## 2018-05-05 NOTE — Anesthesia Preprocedure Evaluation (Addendum)
Anesthesia Evaluation  Patient identified by MRN, date of birth, ID band Patient awake    Reviewed: Allergy & Precautions, NPO status , Patient's Chart, lab work & pertinent test results  Airway Mallampati: II  TM Distance: >3 FB Neck ROM: Full    Dental   Pulmonary sleep apnea , former smoker,    breath sounds clear to auscultation       Cardiovascular hypertension, Pt. on medications + pacemaker  Rhythm:Regular Rate:Normal  Normal EF in 2018 Echo.   Neuro/Psych negative neurological ROS     GI/Hepatic Neg liver ROS, GERD  ,  Endo/Other  diabetes, Type 2, Oral Hypoglycemic AgentsMorbid obesity  Renal/GU negative Renal ROS     Musculoskeletal  (+) Arthritis ,   Abdominal (+) + obese,   Peds  Hematology negative hematology ROS (+)   Anesthesia Other Findings   Reproductive/Obstetrics                            Lab Results  Component Value Date   WBC 8.7 04/30/2018   HGB 14.0 04/30/2018   HCT 40.6 04/30/2018   MCV 86 04/30/2018   PLT 277 04/30/2018   Lab Results  Component Value Date   CREATININE 0.80 05/01/2018   BUN 14 05/01/2018   NA 137 05/01/2018   K 3.9 05/01/2018   CL 103 05/01/2018   CO2 24 05/01/2018    Anesthesia Physical Anesthesia Plan  ASA: III  Anesthesia Plan: MAC   Post-op Pain Management:    Induction: Intravenous  PONV Risk Score and Plan: 1 and Propofol infusion, Ondansetron and Treatment may vary due to age or medical condition  Airway Management Planned: Natural Airway and Simple Face Mask  Additional Equipment:   Intra-op Plan:   Post-operative Plan:   Informed Consent: I have reviewed the patients History and Physical, chart, labs and discussed the procedure including the risks, benefits and alternatives for the proposed anesthesia with the patient or authorized representative who has indicated his/her understanding and acceptance.     Plan  Discussed with: CRNA  Anesthesia Plan Comments:        Anesthesia Quick Evaluation

## 2018-05-06 ENCOUNTER — Other Ambulatory Visit: Payer: Self-pay

## 2018-05-06 ENCOUNTER — Encounter (HOSPITAL_COMMUNITY): Payer: Self-pay | Admitting: *Deleted

## 2018-05-06 ENCOUNTER — Ambulatory Visit (HOSPITAL_COMMUNITY): Payer: BC Managed Care – PPO | Admitting: Anesthesiology

## 2018-05-06 ENCOUNTER — Encounter (HOSPITAL_COMMUNITY)
Admission: RE | Disposition: A | Discharge status: Home / Self Care | Payer: Self-pay | Source: Ambulatory Visit | Attending: Gastroenterology

## 2018-05-06 ENCOUNTER — Ambulatory Visit (HOSPITAL_COMMUNITY)
Admission: RE | Admit: 2018-05-06 | Discharge: 2018-05-06 | Disposition: A | Discharge status: Home / Self Care | Payer: BC Managed Care – PPO | Source: Ambulatory Visit | Attending: Gastroenterology | Admitting: Gastroenterology

## 2018-05-06 DIAGNOSIS — Z7984 Long term (current) use of oral hypoglycemic drugs: Secondary | ICD-10-CM | POA: Insufficient documentation

## 2018-05-06 DIAGNOSIS — D127 Benign neoplasm of rectosigmoid junction: Secondary | ICD-10-CM | POA: Diagnosis not present

## 2018-05-06 DIAGNOSIS — K621 Rectal polyp: Secondary | ICD-10-CM | POA: Diagnosis not present

## 2018-05-06 DIAGNOSIS — I10 Essential (primary) hypertension: Secondary | ICD-10-CM | POA: Insufficient documentation

## 2018-05-06 DIAGNOSIS — K635 Polyp of colon: Secondary | ICD-10-CM | POA: Insufficient documentation

## 2018-05-06 DIAGNOSIS — E119 Type 2 diabetes mellitus without complications: Secondary | ICD-10-CM | POA: Diagnosis not present

## 2018-05-06 DIAGNOSIS — Z86010 Personal history of colonic polyps: Secondary | ICD-10-CM

## 2018-05-06 DIAGNOSIS — K5730 Diverticulosis of large intestine without perforation or abscess without bleeding: Secondary | ICD-10-CM | POA: Diagnosis not present

## 2018-05-06 DIAGNOSIS — Z950 Presence of cardiac pacemaker: Secondary | ICD-10-CM | POA: Diagnosis not present

## 2018-05-06 DIAGNOSIS — D124 Benign neoplasm of descending colon: Secondary | ICD-10-CM | POA: Diagnosis not present

## 2018-05-06 DIAGNOSIS — D122 Benign neoplasm of ascending colon: Secondary | ICD-10-CM | POA: Diagnosis not present

## 2018-05-06 DIAGNOSIS — G4733 Obstructive sleep apnea (adult) (pediatric): Secondary | ICD-10-CM | POA: Diagnosis not present

## 2018-05-06 DIAGNOSIS — D125 Benign neoplasm of sigmoid colon: Secondary | ICD-10-CM

## 2018-05-06 DIAGNOSIS — Z9989 Dependence on other enabling machines and devices: Secondary | ICD-10-CM | POA: Diagnosis not present

## 2018-05-06 DIAGNOSIS — Z87891 Personal history of nicotine dependence: Secondary | ICD-10-CM | POA: Insufficient documentation

## 2018-05-06 DIAGNOSIS — Z6841 Body Mass Index (BMI) 40.0 and over, adult: Secondary | ICD-10-CM | POA: Insufficient documentation

## 2018-05-06 DIAGNOSIS — Z09 Encounter for follow-up examination after completed treatment for conditions other than malignant neoplasm: Secondary | ICD-10-CM | POA: Insufficient documentation

## 2018-05-06 DIAGNOSIS — K641 Second degree hemorrhoids: Secondary | ICD-10-CM | POA: Diagnosis not present

## 2018-05-06 HISTORY — PX: BIOPSY: SHX5522

## 2018-05-06 HISTORY — PX: COLONOSCOPY WITH PROPOFOL: SHX5780

## 2018-05-06 HISTORY — PX: POLYPECTOMY: SHX5525

## 2018-05-06 LAB — GLUCOSE, CAPILLARY: Glucose-Capillary: 160 mg/dL — ABNORMAL HIGH (ref 70–99)

## 2018-05-06 SURGERY — COLONOSCOPY WITH PROPOFOL
Anesthesia: Monitor Anesthesia Care

## 2018-05-06 MED ORDER — ONDANSETRON HCL 4 MG/2ML IJ SOLN
INTRAMUSCULAR | Status: DC | PRN
  Administered 2018-05-06: 4 mg via INTRAVENOUS

## 2018-05-06 MED ORDER — PROPOFOL 500 MG/50ML IV EMUL
INTRAVENOUS | Status: DC | PRN
  Administered 2018-05-06: 50 ug/kg/min via INTRAVENOUS

## 2018-05-06 MED ORDER — LIDOCAINE 2% (20 MG/ML) 5 ML SYRINGE
INTRAMUSCULAR | Status: DC | PRN
  Administered 2018-05-06: 60 mg via INTRAVENOUS

## 2018-05-06 MED ORDER — PROPOFOL 10 MG/ML IV BOLUS
INTRAVENOUS | Status: DC | PRN
  Administered 2018-05-06: 50 mg via INTRAVENOUS

## 2018-05-06 MED ORDER — LACTATED RINGERS IV SOLN
INTRAVENOUS | Status: AC | PRN
  Administered 2018-05-06: 1000 mL via INTRAVENOUS

## 2018-05-06 MED ORDER — GLYCOPYRROLATE 0.2 MG/ML IJ SOLN
INTRAMUSCULAR | Status: DC | PRN
  Administered 2018-05-06: 0.2 mg via INTRAVENOUS

## 2018-05-06 MED ORDER — SODIUM CHLORIDE 0.9 % IV SOLN: INTRAVENOUS | Status: DC

## 2018-05-06 SURGICAL SUPPLY — 21 items

## 2018-05-06 NOTE — Op Note (Signed)
[facility] Patient Name: Glenn Lyons Procedure Date : 05/06/2018 MRN: [id number] Attending MD: Justice Britain , MD Date of Birth: 1955/09/28 CSN: [id number] Age: 62 Admit Type: Outpatient Procedure:                Colonoscopy Indications:              High risk colon cancer surveillance: Personal                            history of colonic polyps (Multiple SSPs in                            left-sided colon polyps - innumerable per last 2016                            report) Providers:                Justice Britain, MD, Burtis Junes, RN, William Dalton, Technician Referring MD:              Medicines:                Monitored Anesthesia Care Complications:            No immediate complications. Estimated Blood Loss:     Estimated blood loss was minimal. Procedure:                Pre-Anesthesia Assessment:                           - Prior to the procedure, a History and Physical                            was performed, and patient medications and                            allergies were reviewed. The patient's tolerance of                            previous anesthesia was also reviewed. The risks                            and benefits of the procedure and the sedation                            options and risks were discussed with the patient.                            All questions were answered, and informed consent                            was obtained. Prior Anticoagulants: The patient has                            taken aspirin, last  dose was 3 days prior to                            procedure. ASA Grade Assessment: III - A patient                            with severe systemic disease. After reviewing the                            risks and benefits, the patient was deemed in                            satisfactory condition to undergo the procedure.                           After obtaining informed consent, the  colonoscope                            was passed under direct vision. Throughout the                            procedure, the patient's blood pressure, pulse, and                            oxygen saturations were monitored continuously. The                            CF-HQ190L ([id number]) Olympus adult colon was                            introduced through the anus and advanced to the 5                            cm into the ileum. The colonoscopy was performed                            without difficulty. The patient tolerated the                            procedure. The quality of the bowel preparation was                            evaluated using the BBPS [facility] Bowel Preparation                            Scale) with scores of: Right Colon = 2 (minor                            amount of residual staining, small fragments of                            stool and/or opaque liquid, but mucosa seen well),  Transverse Colon = 3 (entire mucosa seen well with                            no residual staining, small fragments of stool or                            opaque liquid) and Left Colon = 3 (entire mucosa                            seen well with no residual staining, small                            fragments of stool or opaque liquid). The total                            BBPS score equals 8. The quality of the bowel                            preparation was good. Scope In: 7:48:30 AM Scope Out: 8:27:28 AM Scope Withdrawal Time: 0 hours 33 minutes 31 seconds  Total Procedure Duration: 0 hours 38 minutes 58 seconds  Findings:      The digital rectal exam findings include non-thrombosed internal       hemorrhoids. Pertinent negatives include no palpable rectal lesions.      The terminal ileum and ileocecal valve appeared normal.      A 7 mm polyp was found in the ascending colon. The polyp was sessile.       The polyp was removed with a cold snare.  Resection and retrieval were       complete.      Many small and large-mouthed diverticula were found in the recto-sigmoid       colon and sigmoid colon.      Many (previously noted as innumerable) sessile polyps were found in the       rectum, recto-sigmoid colon and sigmoid colon. The polyps were 1 to 3 mm       in size. As noted below multiple of these have been removed due to the       patient's prior history of SSPs being noted on biopsies from 2016.      11 sessile polyps were found in the rectum, recto-sigmoid colon, sigmoid       colon and descending colon. The polyps were 2 to 5 mm in size. These       polyps were removed with a cold snare. Resection and retrieval were       complete.      21 sessile polyps were found in the rectum, recto-sigmoid colon and       sigmoid colon. The polyps were 1 to 2 mm in size. These polyps were       removed with a cold biopsy forceps. Resection and retrieval were       complete.      A 7 mm polyp was found in the rectum. The polyp was sessile. The polyp       was removed with a cold snare. Resection and retrieval were complete.       Oozing persisted for 5-minutes. For hemostasis, two hemostatic clips  were successfully placed (MR conditional). There was no bleeding at the       end of the procedure.      Non-bleeding non-thrombosed internal hemorrhoids were found during       retroflexion, during perianal exam and during digital exam. The       hemorrhoids were Grade II (internal hemorrhoids that prolapse but reduce       spontaneously). Impression:               - Non-thrombosed internal hemorrhoids found on                            digital rectal exam.                           - The examined portion of the ileum was normal.                           - One 7 mm polyp in the ascending colon, removed                            with a cold snare. Resected and retrieved.                           - Diverticulosis in the recto-sigmoid  colon and in                            the sigmoid colon.                           - Many 1 to 3 mm polyps in the rectum, at the                            recto-sigmoid colon and in the sigmoid colon.                            Previously documented as innumberable and this                            would be correct. Because there were SSPs in the                            2016 biopsies, I elected to try and remove as many                            as possible of the larger polyps, though many had                            appearance of typical Hyperplastic polyps.                           - 11, 2 to 5 mm polyps in the rectum, at the  recto-sigmoid colon, in the sigmoid colon and in                            the descending colon, removed with a cold snare.                            Resected and retrieved.                           - 21, 1 to 2 mm polyps in the rectum, at the                            recto-sigmoid colon and in the sigmoid colon,                            removed with a cold biopsy forceps. Resected and                            retrieved.                           - One 7 mm polyp in the rectum, removed with a cold                            snare. Resected and retrieved. Clips (MR                            conditional) were placed due to persistent oozing                            after 5-minutes.                           - Non-bleeding non-thrombosed internal hemorrhoids. Recommendation:           - The patient will be observed post-procedure,                            until all discharge criteria are met.                           - Discharge patient to home.                           - Patient has a contact number available for                            emergencies. The signs and symptoms of potential                            delayed complications were discussed with the                            patient. Return to normal  activities tomorrow.  Written discharge instructions were provided to the                            patient.                           - High fiber diet.                           - Continue present medications.                           - No buprofen, naproxen, or other non-steroidal                            anti-inflammatory drugs for 1 week.                           - Await pathology results.                           - Repeat colonoscopy for surveillance based on                            pathology results and if findings of SSPs are                            noted. If many SSPs are noted >10, then I will                            discuss with primary GI consideration of repeat                            Flexible Sigmoidoscopy in 6-12 months.                           - The findings and recommendations were discussed                            with the patient.                           - The findings and recommendations were discussed                            with the patient's family. Procedure Code(s):        --- Professional ---                           [phone], Colonoscopy, flexible; with removal of                            tumor(s), polyp(s), or other lesion(s) by snare                            technique  08811, 27, Colonoscopy, flexible; with biopsy,                            single or multiple Diagnosis Code(s):        --- Professional ---                           Z86.010, Personal history of colonic polyps                           K64.1, Second degree hemorrhoids                           D12.2, Benign neoplasm of ascending colon                           K62.1, Rectal polyp                           D12.4, Benign neoplasm of descending colon                           D12.7, Benign neoplasm of rectosigmoid junction                           D12.5, Benign neoplasm of sigmoid colon                            K57.30, Diverticulosis of large intestine without                            perforation or abscess without bleeding CPT copyright 2018 American Medical Association. All rights reserved. The codes documented in this report are preliminary and upon coder review may  be revised to meet current compliance requirements. Justice Britain, MD 05/06/2018 8:45:26 AM Number of Addenda: 0

## 2018-05-06 NOTE — Transfer of Care (Signed)
Immediate Anesthesia Transfer of Care Note  Patient: Glenn Lyons  Procedure(s) Performed: COLONOSCOPY WITH PROPOFOL (N/A )  Patient Location: PACU and Endoscopy Unit  Anesthesia Type:MAC  Level of Consciousness: awake and patient cooperative  Airway & Oxygen Therapy: Patient Spontanous Breathing  Post-op Assessment: Report given to RN, Post -op Vital signs reviewed and stable and Patient moving all extremities  Post vital signs: Reviewed and stable  Last Vitals:  Vitals Value Taken Time  BP    Temp    Pulse    Resp    SpO2      Last Pain:  Vitals:   05/06/18 0637  TempSrc: Oral  PainSc: 0-No pain         Complications: No apparent anesthesia complications

## 2018-05-06 NOTE — Discharge Instructions (Signed)

## 2018-05-06 NOTE — Anesthesia Postprocedure Evaluation (Signed)
Anesthesia Post Note  Patient: Glenn Lyons  Procedure(s) Performed: COLONOSCOPY WITH PROPOFOL (N/A ) POLYPECTOMY HEMOSTASIS CLIP PLACEMENT     Patient location during evaluation: PACU Anesthesia Type: MAC Level of consciousness: awake and alert Pain management: pain level controlled Vital Signs Assessment: post-procedure vital signs reviewed and stable Respiratory status: spontaneous breathing, nonlabored ventilation, respiratory function stable and patient connected to nasal cannula oxygen Cardiovascular status: stable and blood pressure returned to baseline Postop Assessment: no apparent nausea or vomiting Anesthetic complications: no    Last Vitals:  Vitals:   05/06/18 0845 05/06/18 0900  BP: 117/65 126/66  Pulse: 60 (!) 59  Resp: 13 11  Temp:    SpO2: 97% 99%    Last Pain:  Vitals:   05/06/18 0900  TempSrc:   PainSc: 0-No pain                 Tiajuana Amass

## 2018-05-06 NOTE — Anesthesia Procedure Notes (Signed)
Procedure Name: MAC Date/Time: 05/06/2018 7:45 AM Performed by: Lowella Dell, CRNA Pre-anesthesia Checklist: Patient identified, Emergency Drugs available, Suction available, Patient being monitored and Timeout performed Patient Re-evaluated:Patient Re-evaluated prior to induction Oxygen Delivery Method: Simple face mask Induction Type: IV induction Placement Confirmation: positive ETCO2 Dental Injury: Teeth and Oropharynx as per pre-operative assessment

## 2018-05-06 NOTE — H&P (Signed)
GASTROENTEROLOGY OUTPATIENT PROCEDURE H&P NOTE   Primary Care Physician: Tonia Ghent, MD  HPI: Glenn Lyons is a 62 y.o. male who presents for Colonoscopy.  Past Medical History:  Diagnosis Date  . Achalasia    with prev eval at [facility].  No intervention as of 2012  . Aspiration pneumonia ([place]) 10/05/2014  . Backache, unspecified   . Cardiac pacemaker St. Jude    ERI 2008  . Depressive disorder, not elsewhere classified   . Diabetes ([place])   . Esophageal reflux   . Morbid obesity ([employer])   . Obstructive sleep apnea    biapap settign 25-22  . Osteoarthrosis, unspecified whether generalized or localized, unspecified site   . Osteoarthrosis, unspecified whether generalized or localized, unspecified site   . Presence of permanent cardiac pacemaker    St. Jude- Dr. Caryl Comes follows -device Check 07-06-14  . Sinus bradycardia /pauses   . Stricture and stenosis of esophagus    Past Surgical History:  Procedure Laterality Date  . BALLOON DILATION N/A 10/06/2015   Procedure: BALLOON DILATION;  Surgeon: Mauri Pole, MD;  Location: [facility];  Service: Endoscopy;  Laterality: N/A;  . COLONOSCOPY W/ POLYPECTOMY  11/01/2010  . COLONOSCOPY WITH PROPOFOL N/A 10/05/2014   Procedure: COLONOSCOPY WITH PROPOFOL;  Surgeon: Gatha Mayer, MD;  Location: [facility];  Service: Endoscopy;  Laterality: N/A;  . ESOPHAGEAL DILATION    . ESOPHAGEAL MANOMETRY N/A 08/23/2015   Procedure: ESOPHAGEAL MANOMETRY (EM);  Surgeon: Mauri Pole, MD;  Location: [facility];  Service: Endoscopy;  Laterality: N/A;  . ESOPHAGOGASTRODUODENOSCOPY N/A 10/09/2014   Procedure: ESOPHAGOGASTRODUODENOSCOPY (EGD);  Surgeon: Gatha Mayer, MD;  Location: [facility];  Service: Endoscopy;  Laterality: N/A;  . ESOPHAGOGASTRODUODENOSCOPY (EGD) WITH PROPOFOL N/A 08/23/2015   Procedure: ESOPHAGOGASTRODUODENOSCOPY (EGD) WITH PROPOFOL;  Surgeon: Mauri Pole, MD;  Location: [facility];  Service: Endoscopy;   Laterality: N/A;  . ESOPHAGOGASTRODUODENOSCOPY (EGD) WITH PROPOFOL N/A 10/06/2015   Procedure: ESOPHAGOGASTRODUODENOSCOPY (EGD) WITH PROPOFOL;  Surgeon: Mauri Pole, MD;  Location: [place] [facility];  Service: Endoscopy;  Laterality: N/A;  Rigiflex ballon size 24m-35mm size 45 minute proc,need Fluro Gastografin esophagram 2 hrs post EGD   . PACEMAKER INSERTION    . PPM GENERATOR CHANGEOUT N/A 05/01/2018   Procedure: PPM GENERATOR CHANGEOUT;  Surgeon: KDeboraha Sprang MD;  [facility];  Service: Cardiovascular;  Laterality: N/A;   Current Facility-Administered Medications  Medication Dose Route Frequency Provider Last Rate Last Dose  . 0.9 %  sodium chloride infusion   Intravenous Continuous LLevin Erp PUtah     . lactated ringers infusion    Continuous PRN Mansouraty, GTelford Nab, MD   1,000 mL at 05/06/18 0645   Allergies  Allergen Reactions  . Morphine Other (See Comments)    Cardiac Arrest   Family History  Problem Relation Age of Onset  . Heart attack Father   . Heart disease Father   . Cancer Father        multiple myeloma  . Hypertension Father   . Prostate cancer Father        possible dx  . Hypertension Mother   . Dementia Mother   . Colon cancer Neg Hx    Social History   Socioeconomic History  . Marital status: Married    Spouse name: Not on file  . Number of children: 2  . Years of education: Not on file  . Highest education level: Not on file  Occupational History  . 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 Occupation: singer    Employer: [employer]  Social Needs  . Financial resource strain: Not on file  . Food insecurity:    Worry: Not on file    Inability: Not on file  . Transportation needs:    Medical: Not on file    Non-medical: Not on file  Tobacco Use  . Smoking status: Former Smoker    Packs/day: 1.00    Years: 20.00    Pack years: 20.00    Types: Cigarettes    Last attempt to quit: 05/29/2004    Years since  quitting: 13.9  . Smokeless tobacco: Never Used  Substance and Sexual Activity  . Alcohol use: Yes    Comment: rarely  . Drug use: No  . Sexual activity: Yes  Lifestyle  . Physical activity:    Days per week: Not on file    Minutes per session: Not on file  . Stress: Not on file  Relationships  . Social connections:    Talks on phone: Not on file    Gets together: Not on file    Attends religious service: Not on file    Active member of club or organization: Not on file    Attends meetings of clubs or organizations: Not on file    Relationship status: Not on file  . Intimate partner violence:    Fear of current or ex partner: Not on file    Emotionally abused: Not on file    Physically abused: Not on file    Forced sexual activity: Not on file  Other Topics Concern  . Not on file  Social History Narrative   From Air cabin crew   Married    Physical Exam: Vital signs in last 24 hours: Temp:  [98.2 F (36.8 C)] 98.2 F (36.8 C) (12/09 0637) Resp:  [16] 16 (12/09 0637) BP: (135)/(69) 135/69 (12/09 0637) SpO2:  [97 %] 97 % (12/09 0637) Weight:  [176.9 kg] 176.9 kg (12/09 0637)   GEN: NAD EYE: Sclerae anicteric ENT: MMM CV: RR without R/Gs  RESP: CTAB posteriorly GI: Soft, protuberant, rounded, obese, NT NEURO:  Alert & Oriented x 3  Lab Results: No results for input(s): WBC, HGB, HCT, PLT in the last 72 hours. BMET No results for input(s): NA, K, CL, CO2, GLUCOSE, BUN, CREATININE, CALCIUM in the last 72 hours. LFT No results for input(s): PROT, ALBUMIN, AST, ALT, ALKPHOS, BILITOT, BILIDIR, IBILI in the last 72 hours. PT/INR No results for input(s): LABPROT, INR in the last 72 hours.   Impression / Plan: This is a 62 y.o.male who presents for Colonoscopy.  The risks and benefits of endoscopic evaluation were discussed with the patient; these include but are not limited to the risk of perforation, infection, bleeding, missed lesions, lack of  diagnosis, severe illness requiring hospitalization, as well as anesthesia and sedation related illnesses.  The patient is agreeable to proceed.    Justice Britain, MD [facility]oscopy Office # [phone]

## 2018-05-11 ENCOUNTER — Other Ambulatory Visit: Payer: Self-pay | Admitting: Family Medicine

## 2018-05-13 ENCOUNTER — Ambulatory Visit: Payer: BC Managed Care – PPO | Admitting: Family Medicine

## 2018-05-13 ENCOUNTER — Encounter: Payer: Self-pay | Admitting: Family Medicine

## 2018-05-13 ENCOUNTER — Ambulatory Visit (INDEPENDENT_AMBULATORY_CARE_PROVIDER_SITE_OTHER): Payer: BC Managed Care – PPO | Admitting: *Deleted

## 2018-05-13 ENCOUNTER — Encounter: Payer: Self-pay | Admitting: Gastroenterology

## 2018-05-13 VITALS — BP 122/70 | HR 70 | Temp 97.6°F | Ht 71.5 in | Wt 381.8 lb

## 2018-05-13 DIAGNOSIS — E1140 Type 2 diabetes mellitus with diabetic neuropathy, unspecified: Secondary | ICD-10-CM

## 2018-05-13 DIAGNOSIS — R001 Bradycardia, unspecified: Secondary | ICD-10-CM | POA: Diagnosis not present

## 2018-05-13 DIAGNOSIS — E119 Type 2 diabetes mellitus without complications: Secondary | ICD-10-CM | POA: Diagnosis not present

## 2018-05-13 LAB — POCT GLYCOSYLATED HEMOGLOBIN (HGB A1C): Hemoglobin A1C: 8.6 % — AB (ref 4.0–5.6)

## 2018-05-13 NOTE — Patient Instructions (Addendum)
Thanks for your effort.   Your A1c is better.    Recheck in about 3 months.  The only lab you need to have done for your next diabetic visit is an A1c.  We can do this with a fingerstick test at the office visit.  You do not need a lab visit ahead of time for this.  It does not matter if you are fasting when the lab is done.   Take care.  Glad to see you.

## 2018-05-13 NOTE — Progress Notes (Signed)
Diabetes:  Using medications without difficulties: yes Hypoglycemic episodes: no Hyperglycemic episodes:no Feet problems:some occ toe pain, better in the last few weeks.  Blood Sugars averaging: ~200 in the AM but lower later in the day.  By midafternoon his sugar is down to ~130.  eye exam within last year: yes  He is working on diet and he is using a new Bipap.  He is sleeping better.  His legs feel better.     He had colonoscopy and cardiology follow up.   Meds, vitals, and allergies reviewed.   ROS: Per HPI unless specifically indicated in ROS section   GEN: nad, alert and oriented HEENT: mucous membranes moist NECK: supple w/o LA CV: rrr. PULM: ctab, no inc wob ABD: soft, +bs EXT: no edema SKIN: no acute rash  Diabetic foot exam: Normal inspection No skin breakdown No calluses  Normal DP pulses Normal sensation to light touch but dec to monofilament Nails normal

## 2018-05-14 NOTE — Assessment & Plan Note (Signed)
He does have decreased sensation to monofilament on the feet bilaterally.  Discussed routine foot care.  Blood sugars improved with more work on diet in the last month.  Labs discussed with patient.  He had improvement in A1c without change in medications.  Continue current medications as is to see how much difference he can make in the next 3 months.  Encourage diet and exercise.  He is still considering lap band surgery or similar.  He is not set on proceeding with that yet.  It makes sense to see what he can do in the meantime with diet otherwise.  Continue using BiPAP, etc.  He will update me as needed.

## 2018-05-15 LAB — CUP PACEART INCLINIC DEVICE CHECK
Battery Remaining Longevity: 130 mo
Battery Voltage: 3.11 V
Brady Statistic RA Percent Paced: 38 %
Brady Statistic RV Percent Paced: 0.2 %
Date Time Interrogation Session: 20191216162628
Implantable Lead Implant Date: 20061219
Implantable Lead Implant Date: 20061219
Implantable Lead Location: 753859
Implantable Lead Location: 753860
Implantable Pulse Generator Implant Date: 20191204
Lead Channel Impedance Value: 1075 Ohm
Lead Channel Impedance Value: 475 Ohm
Lead Channel Pacing Threshold Amplitude: 1 V
Lead Channel Pacing Threshold Amplitude: 1 V
Lead Channel Pacing Threshold Amplitude: 1.5 V
Lead Channel Pacing Threshold Amplitude: 1.5 V
Lead Channel Pacing Threshold Pulse Width: 0.5 ms
Lead Channel Pacing Threshold Pulse Width: 0.5 ms
Lead Channel Pacing Threshold Pulse Width: 0.5 ms
Lead Channel Pacing Threshold Pulse Width: 0.5 ms
Lead Channel Sensing Intrinsic Amplitude: 5 mV
Lead Channel Sensing Intrinsic Amplitude: 9 mV
Lead Channel Setting Pacing Amplitude: 1.375
Lead Channel Setting Pacing Amplitude: 3.375
Lead Channel Setting Pacing Pulse Width: 0.5 ms
Lead Channel Setting Sensing Sensitivity: 2.5 mV
Pulse Gen Model: 2272
Pulse Gen Serial Number: 9090970

## 2018-05-15 NOTE — Progress Notes (Signed)
Wound check appointment. Dermabond removed. Wound without redness or edema. Incision edges mostly approximated - small pinhole noted at L corner of incision - no drainage or redness noted. Site explored, stitch was not discovered. Bacitracin ointment applied, incision edges reapproximated, steri strips applied. Patient instructed to remove strips in 3 days. Patient prefers to call for any signs/symptoms of infection, or failure to heal.  Normal device function. Thresholds, sensing, and impedances consistent with implant measurements. Auto capture turned on at implant. Histogram distribution appropriate for patient and level of activity. No mode switches or high ventricular rates noted. Patient educated about wound care, arm mobility, lifting restrictions. ROV in 3 months with SK.

## 2018-06-22 ENCOUNTER — Other Ambulatory Visit: Payer: Self-pay | Admitting: Family Medicine

## 2018-06-24 NOTE — Telephone Encounter (Signed)
Electronic refill request. Torsemide Last Filled:   04/06/18 Patient is apparently taking these daily but it is prescribed: every day as needed.  Please advise.

## 2018-06-25 NOTE — Telephone Encounter (Signed)
Sent. Thanks.   

## 2018-07-23 ENCOUNTER — Encounter: Payer: BC Managed Care – PPO | Admitting: Internal Medicine

## 2018-07-29 ENCOUNTER — Other Ambulatory Visit: Payer: Self-pay | Admitting: Family Medicine

## 2018-07-30 ENCOUNTER — Ambulatory Visit: Payer: BC Managed Care – PPO | Admitting: Family Medicine

## 2018-07-30 ENCOUNTER — Encounter: Payer: Self-pay | Admitting: Family Medicine

## 2018-07-30 VITALS — BP 146/80 | HR 64 | Temp 97.8°F | Ht 71.5 in | Wt 380.5 lb

## 2018-07-30 DIAGNOSIS — E119 Type 2 diabetes mellitus without complications: Secondary | ICD-10-CM | POA: Diagnosis not present

## 2018-07-30 DIAGNOSIS — E1140 Type 2 diabetes mellitus with diabetic neuropathy, unspecified: Secondary | ICD-10-CM | POA: Diagnosis not present

## 2018-07-30 LAB — POCT GLYCOSYLATED HEMOGLOBIN (HGB A1C): Hemoglobin A1C: 8.3 % — AB (ref 4.0–5.6)

## 2018-07-30 MED ORDER — METFORMIN HCL 500 MG PO TABS: 500.0000 mg | ORAL_TABLET | Freq: Every day | ORAL | Status: DC

## 2018-07-30 NOTE — Patient Instructions (Signed)
Keep going as is, recheck in about 3-4 months.  If you need to get labs done elsewhere due to work then let me know.  Take care.  Glad to see you.  If you have lows, then let me know.  Thanks for your effort.

## 2018-07-30 NOTE — Progress Notes (Signed)
Diabetes:  Using medications without difficulties: taking metformin QD, not BID.  He felt better with QD dosing.  Hypoglycemic episodes: no Hyperglycemic episodes: no Feet problems: improved with lower sugar readings.   Blood Sugars averaging: usually 150-180 and that is improved.   eye exam within last year: yes Sugar was 130 this AM.  Weight down 13 lbs since end of 2019.  More salads and water in the meantime.   He is limiting carbs.   A1c improved, but not yet at goal.  Discussed with patient.  Improvement on A1c is noted in spite of lower dose of metformin.  Knees feel better with intentional weight loss.    Meds, vitals, and allergies reviewed.  ROS: Per HPI unless specifically indicated in ROS section   GEN: nad, alert and oriented HEENT: mucous membranes moist NECK: supple w/o LA CV: rrr. PULM: ctab, no inc wob ABD: soft, +bs EXT: trace BLE edema SKIN: well perfused.

## 2018-07-31 NOTE — Assessment & Plan Note (Signed)
He is limiting carbs.   A1c improved, but not yet at goal.  Discussed with patient.  Improvement on A1c is noted in spite of lower dose of metformin. Intentional weight loss noted. No change in meds at this point.  Continue as is with diet and exercise and gradual weight loss.  We can recheck A1c in about 3 or 4 months.  He may be traveling for work.  If he is going to be out of town for a significant amount of time, he can let us know and we can mail him a lab slip for an A1c.  He agrees.

## 2018-08-11 ENCOUNTER — Other Ambulatory Visit: Payer: Self-pay | Admitting: Family Medicine

## 2018-08-12 ENCOUNTER — Encounter: Payer: BC Managed Care – PPO | Admitting: Internal Medicine

## 2018-09-05 ENCOUNTER — Other Ambulatory Visit: Payer: Self-pay | Admitting: Family Medicine

## 2018-09-23 ENCOUNTER — Other Ambulatory Visit: Payer: Self-pay | Admitting: Family Medicine

## 2018-09-23 NOTE — Telephone Encounter (Signed)
Pharmacy requests refill on: Torsemide 20mg   LAST REFILL: #90, 0 refills on 06/25/18 LAST OV: 07/30/18  NEXT OV: none scheduled, due for recheck in June/July   PHARMACY:

## 2018-10-02 ENCOUNTER — Telehealth (INDEPENDENT_AMBULATORY_CARE_PROVIDER_SITE_OTHER): Payer: BC Managed Care – PPO | Admitting: Internal Medicine

## 2018-10-02 ENCOUNTER — Other Ambulatory Visit: Payer: Self-pay

## 2018-10-02 ENCOUNTER — Encounter: Payer: Self-pay | Admitting: Internal Medicine

## 2018-10-02 VITALS — Ht 71.5 in | Wt 386.0 lb

## 2018-10-02 DIAGNOSIS — R001 Bradycardia, unspecified: Secondary | ICD-10-CM

## 2018-10-02 DIAGNOSIS — R609 Edema, unspecified: Secondary | ICD-10-CM | POA: Diagnosis not present

## 2018-10-02 DIAGNOSIS — Z6841 Body Mass Index (BMI) 40.0 and over, adult: Secondary | ICD-10-CM

## 2018-10-02 DIAGNOSIS — Z950 Presence of cardiac pacemaker: Secondary | ICD-10-CM

## 2018-10-02 NOTE — Progress Notes (Signed)
Electrophysiology TeleHealth Note   Due to national recommendations of social distancing due to COVID 19, an audio/video telehealth visit is felt to be most appropriate for this patient at this time.  See MyChart message from today for the patient's consent to telehealth for [facility]   Date:  10/02/2018   ID:  Glenn Lyons, DOB November 27, 1955, MRN [id number]  Location: patient's home  Provider location: [address] [place]  Evaluation Performed: Follow-up visit  PCP:  Tonia Ghent, MD  Cardiologist:     Electrophysiologist:  SK   Chief Complaint:   HFpEF   History of Present Illness:    Glenn Lyons is a 63 y.o. male who presents via audio/video conferencing for a telehealth visit today.  Since last being seen in our clinic for sinus node w remote pacemaker--and gen change 1/20  the patient reports doing well   Date Cr K Hgb  *12/19* 0.8 3.9 14.o         No edema, chronic mild SOB, no chest pain or palpitations  Anxiety and depression   On Celexa   The patient denies symptoms of fevers, chills, cough, or new SOB worrisome for COVID 19.    Past Medical History:  Diagnosis Date  . Achalasia    with prev eval at [facility].  No intervention as of 2012  . Aspiration pneumonia ([place]) 10/05/2014  . Backache, unspecified   . Cardiac pacemaker St. Jude    ERI 2008  . Depressive disorder, not elsewhere classified   . Diabetes ([place])   . Esophageal reflux   . Morbid obesity ([place])   . Obstructive sleep apnea    biapap settign 25-22  . Osteoarthrosis, unspecified whether generalized or localized, unspecified site   . Osteoarthrosis, unspecified whether generalized or localized, unspecified site   . Presence of permanent cardiac pacemaker    St. Jude- Dr. Caryl Comes follows -device Check 07-06-14  . Sinus bradycardia /pauses   . Stricture and stenosis of esophagus     Past Surgical History:  Procedure Laterality Date  . BALLOON DILATION N/A 10/06/2015   Procedure:  BALLOON DILATION;  Surgeon: Mauri Pole, MD;  Location: [facility];  Service: Endoscopy;  Laterality: N/A;  . BIOPSY  05/06/2018   Procedure: BIOPSY;  Surgeon: Rush Landmark Telford Nab., MD;  Location: [facility];  Service: Gastroenterology;;  . COLONOSCOPY W/ POLYPECTOMY  11/01/2010  . COLONOSCOPY WITH PROPOFOL N/A 10/05/2014   Procedure: COLONOSCOPY WITH PROPOFOL;  Surgeon: Gatha Mayer, MD;  Location: [facility];  Service: Endoscopy;  Laterality: N/A;  . COLONOSCOPY WITH PROPOFOL N/A 05/06/2018   Procedure: COLONOSCOPY WITH PROPOFOL;  Surgeon: Rush Landmark Telford Nab., MD;  Location: [facility];  Service: Gastroenterology;  Laterality: N/A;  . ESOPHAGEAL DILATION    . ESOPHAGEAL MANOMETRY N/A 08/23/2015   Procedure: ESOPHAGEAL MANOMETRY (EM);  Surgeon: Mauri Pole, MD;  Location: [facility];  Service: Endoscopy;  Laterality: N/A;  . ESOPHAGOGASTRODUODENOSCOPY N/A 10/09/2014   Procedure: ESOPHAGOGASTRODUODENOSCOPY (EGD);  Surgeon: Gatha Mayer, MD;  Location: [facility];  Service: Endoscopy;  Laterality: N/A;  . ESOPHAGOGASTRODUODENOSCOPY (EGD) WITH PROPOFOL N/A 08/23/2015   Procedure: ESOPHAGOGASTRODUODENOSCOPY (EGD) WITH PROPOFOL;  Surgeon: Mauri Pole, MD;  Location: [facility];  Service: Endoscopy;  Laterality: N/A;  . ESOPHAGOGASTRODUODENOSCOPY (EGD) WITH PROPOFOL N/A 10/06/2015   Procedure: ESOPHAGOGASTRODUODENOSCOPY (EGD) WITH PROPOFOL;  Surgeon: Mauri Pole, MD;  Location: [facility];  Service: Endoscopy;  Laterality: N/A;  Rigiflex ballon size 61m-35mm size 45  minute proc,need Fluro Gastografin esophagram 2 hrs post EGD   . PACEMAKER INSERTION    . POLYPECTOMY  05/06/2018   Procedure: POLYPECTOMY;  Surgeon: Mansouraty, Telford Nab., MD;  Location: [facility];  Service: Gastroenterology;;  . Betsy Pries GENERATOR CHANGEOUT N/A 05/01/2018   Procedure: PPM GENERATOR CHANGEOUT;  Surgeon: Deboraha Sprang, MD;  Location: [facility];  Service:  Cardiovascular;  Laterality: N/A;    Current Outpatient Medications  Medication Sig Dispense Refill  . aspirin 81 MG tablet Take 81 mg by mouth daily.      Marland Kitchen atorvastatin (LIPITOR) 10 MG tablet TAKE 1 TABLET BY MOUTH EVERY DAY 90 tablet 3  . Blood Glucose Monitoring Suppl (ONETOUCH VERIO) w/Device KIT Inject 1 kit into the skin every morning. Use to test blood sugar each day before breakfast and 2 hours after a meal for 2 weeks.  Diagnosis:  E11.9  Non-insulin dependent. 1 kit 0  . cetirizine (ZYRTEC) 10 MG tablet Take 10 mg by mouth daily.     . citalopram (CELEXA) 20 MG tablet TAKE 1 TABLET (20 MG TOTAL) BY MOUTH DAILY. 90 tablet 3  . Emollient (CERAVE) CREA Apply 1 application topically 2 (two) times daily.  0  . glipiZIDE (GLUCOTROL) 5 MG tablet TAKE 1 TABLET (5 MG TOTAL) BY MOUTH DAILY BEFORE BREAKFAST. 90 tablet 1  . ibuprofen (ADVIL,MOTRIN) 200 MG tablet Take 400-800 mg by mouth every 6 (six) hours as needed for headache or moderate pain.    . Lancets (ONETOUCH ULTRASOFT) lancets USE TO TEST BLOOD SUGAR ONCE DAILY OR AS NEEDED 100 each 3  . metFORMIN (GLUCOPHAGE) 500 MG tablet Take 500 mg by mouth daily with breakfast.    . Multiple Vitamin (MULTIVITAMIN WITH MINERALS) TABS tablet Take 1 tablet by mouth daily.    Glory Rosebush VERIO test strip USE TO TEST BLOOD SUGAR ONCE DAILY OR AS INSTRUCTED 100 each 3  . torsemide (DEMADEX) 20 MG tablet TAKE 1 TABLET (20 MG TOTAL) BY MOUTH DAILY AS NEEDED (FOR SWELLING). 90 tablet 0   No current facility-administered medications for this visit.     Allergies:   Morphine and Metformin and related   Social History:  The patient  reports that he quit smoking about 14 years ago. His smoking use included cigarettes. He has a 20.00 pack-year smoking history. He has never used smokeless tobacco. He reports current alcohol use. He reports that he does not use drugs.   Family History:  The patient's   family history includes Cancer in his father; Dementia  in his mother; Heart attack in his father; Heart disease in his father; Hypertension in his father and mother; Prostate cancer in his father.   ROS:  Please see the history of present illness.   All other systems are personally reviewed and negative.    Exam:    Vital Signs:  Ht 5' 11.5" (1.816 m)   Wt (!) 386 lb (175.1 kg)   BMI 53.09 kg/m     Well appearing, alert and conversant, regular work of breathing,  good skin color Eyes- anicteric, neuro- grossly intact, skin- no apparent rash or lesions or cyanosis, mouth- oral mucosa is pink   Labs/Other Tests and Data Reviewed:    Recent Labs: 04/30/2018: Hemoglobin 14.0; Platelets 277 05/01/2018: BUN 14; Creatinine, Ser 0.80; Potassium 3.9; Sodium 137   Wt Readings from Last 3 Encounters:  10/02/18 (!) 386 lb (175.1 kg)  07/30/18 (!) 380 lb 8 oz (172.6 kg)  05/13/18 Marland Kitchen)  381 lb 12 oz (173.2 kg)     Other studies personally reviewed: Additional studies/ records that were reviewed today include:     Device interrogation pending   ASSESSMENT & PLAN:      Sinus node dysfunction  Peripheral edema  Pacemaker-St. Jude     Sleep apnea  Obesity- morbid  HFpEF  Device function is clinically normal  Encouraged weight loss and encourage; salf and calorie restriction  Mild volume overload  As above      COVID 19 screen The patient denies symptoms of COVID 19 at this time.  The importance of social distancing was discussed today.  Follow-up:  58mRemote As Scheduled     Current medicines are reviewed at length with the patient today.   The patient does not have concerns regarding his medicines.  The following changes were made today:  none  Labs/ tests ordered today include:   No orders of the defined types were placed in this encounter.   Future tests ( post COVID )     Patient Risk:  after full review of this patients clinical status, I feel that they are at moderate*  risk at this time.  Today, I have  spent  16  minutes with the patient with telehealth technology discussing the above.  Signed, SVirl Axe MD  10/02/2018 10:14 AM     [facility] [address]SRainsGreensboro [place] [phone]) [phone])

## 2018-10-10 ENCOUNTER — Other Ambulatory Visit: Payer: Self-pay

## 2018-10-10 ENCOUNTER — Ambulatory Visit (INDEPENDENT_AMBULATORY_CARE_PROVIDER_SITE_OTHER): Payer: BC Managed Care – PPO | Admitting: *Deleted

## 2018-10-10 DIAGNOSIS — R001 Bradycardia, unspecified: Secondary | ICD-10-CM

## 2018-10-12 LAB — CUP PACEART REMOTE DEVICE CHECK
Date Time Interrogation Session: 20200516100105
Implantable Lead Implant Date: 20061219
Implantable Lead Implant Date: 20061219
Implantable Lead Location: 753859
Implantable Lead Location: 753860
Implantable Pulse Generator Implant Date: 20191204
Pulse Gen Model: 2272
Pulse Gen Serial Number: 9090970

## 2018-10-13 IMAGING — DX DG KNEE COMPLETE 4+V*L*
4 series · 4 of 4 positions shown · non-contrast
Comparison: No recent.

CLINICAL DATA: Left knee pain and swelling.  No known injury.

EXAM:
LEFT KNEE - COMPLETE 4+ VIEW

[knee ap]
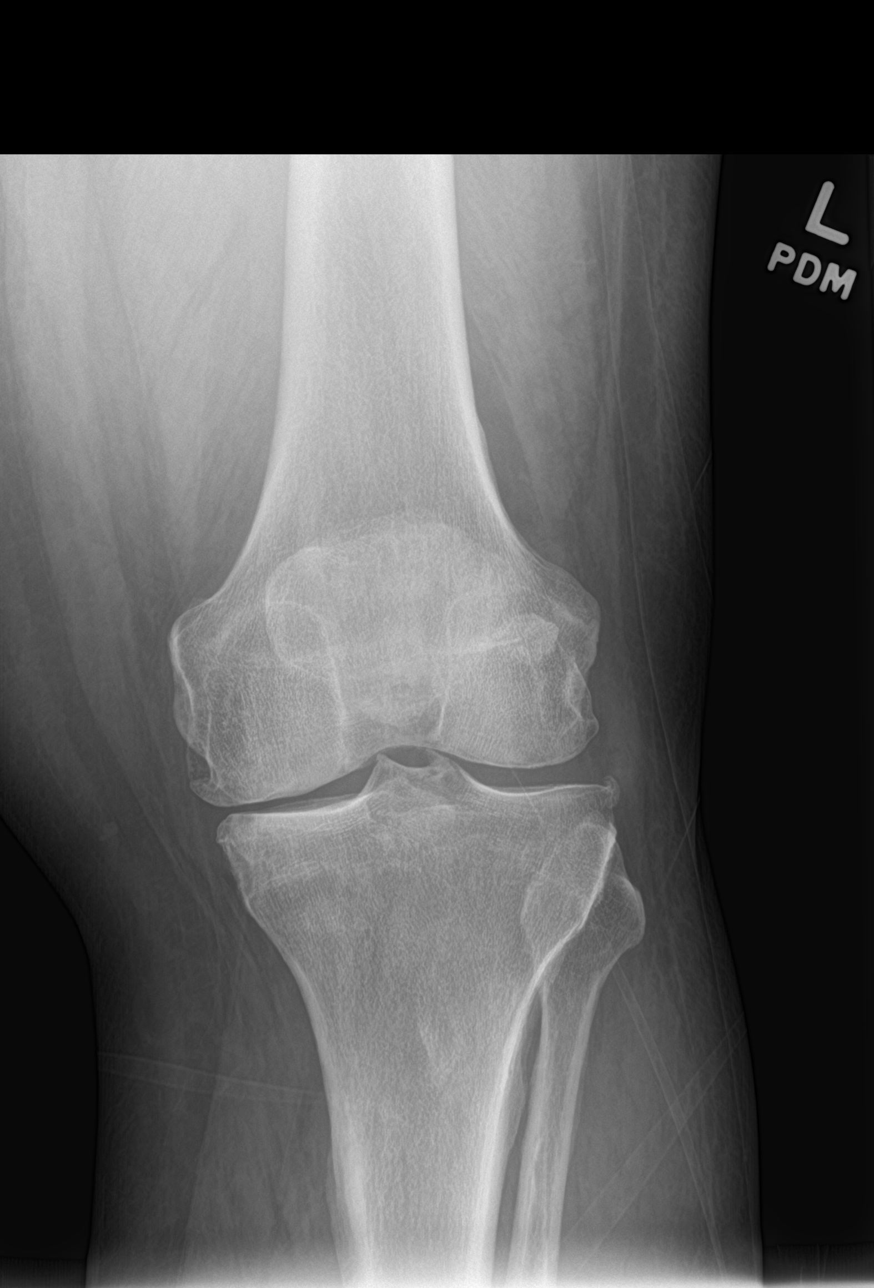

[knee tunnel]
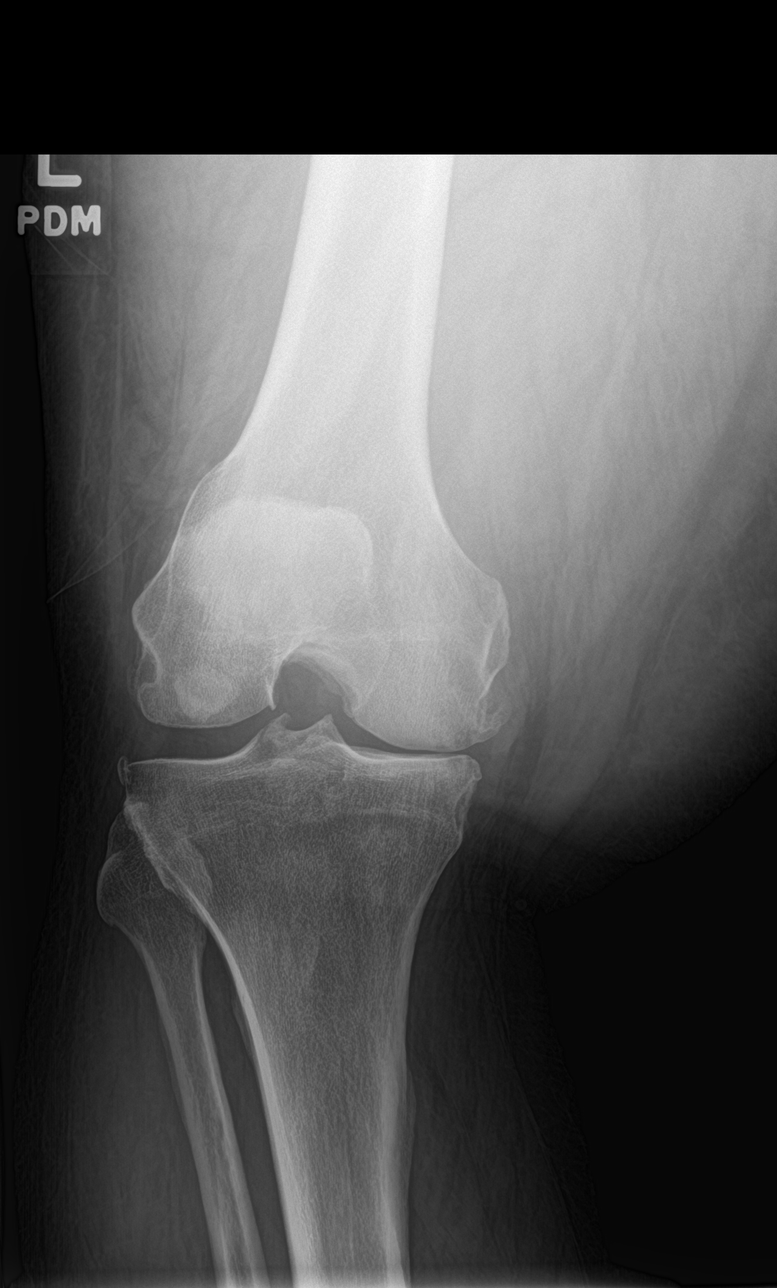

[knee lat]
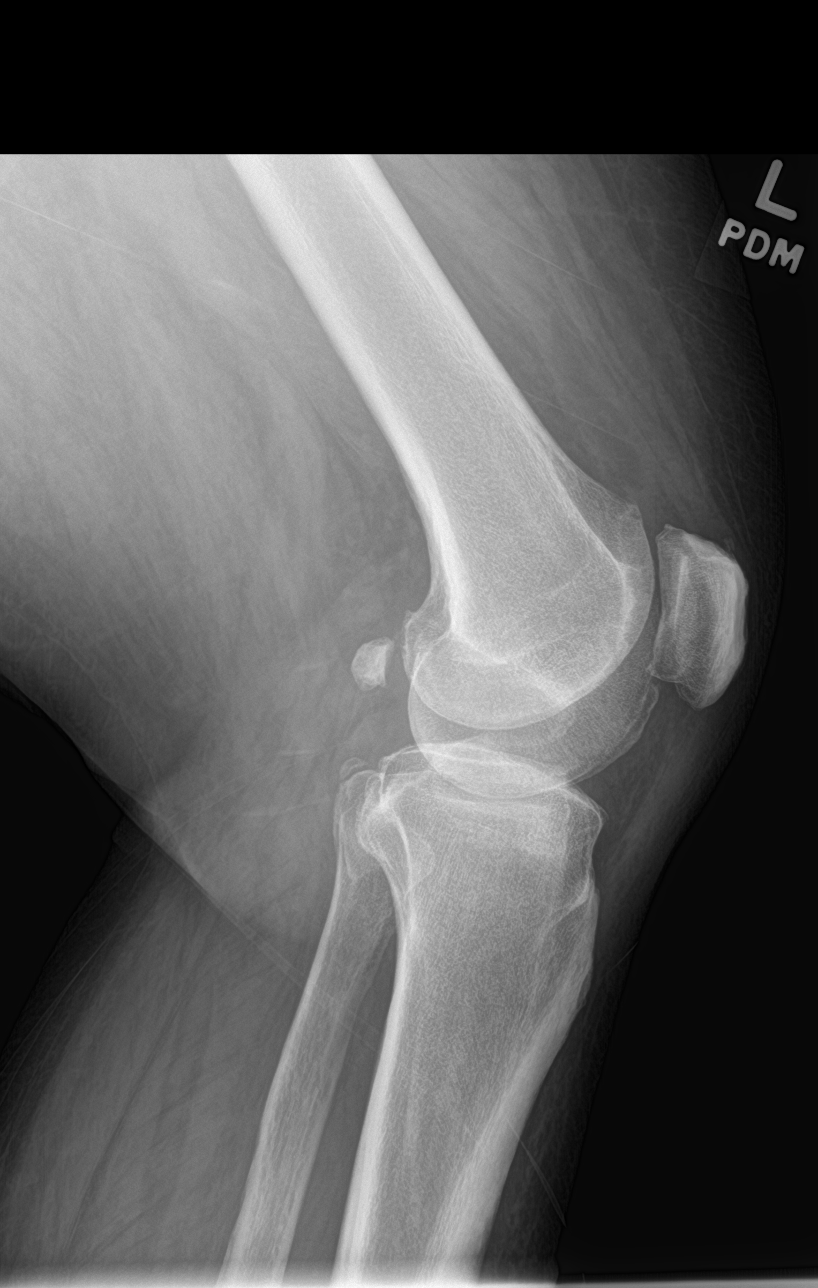

[sunrise]
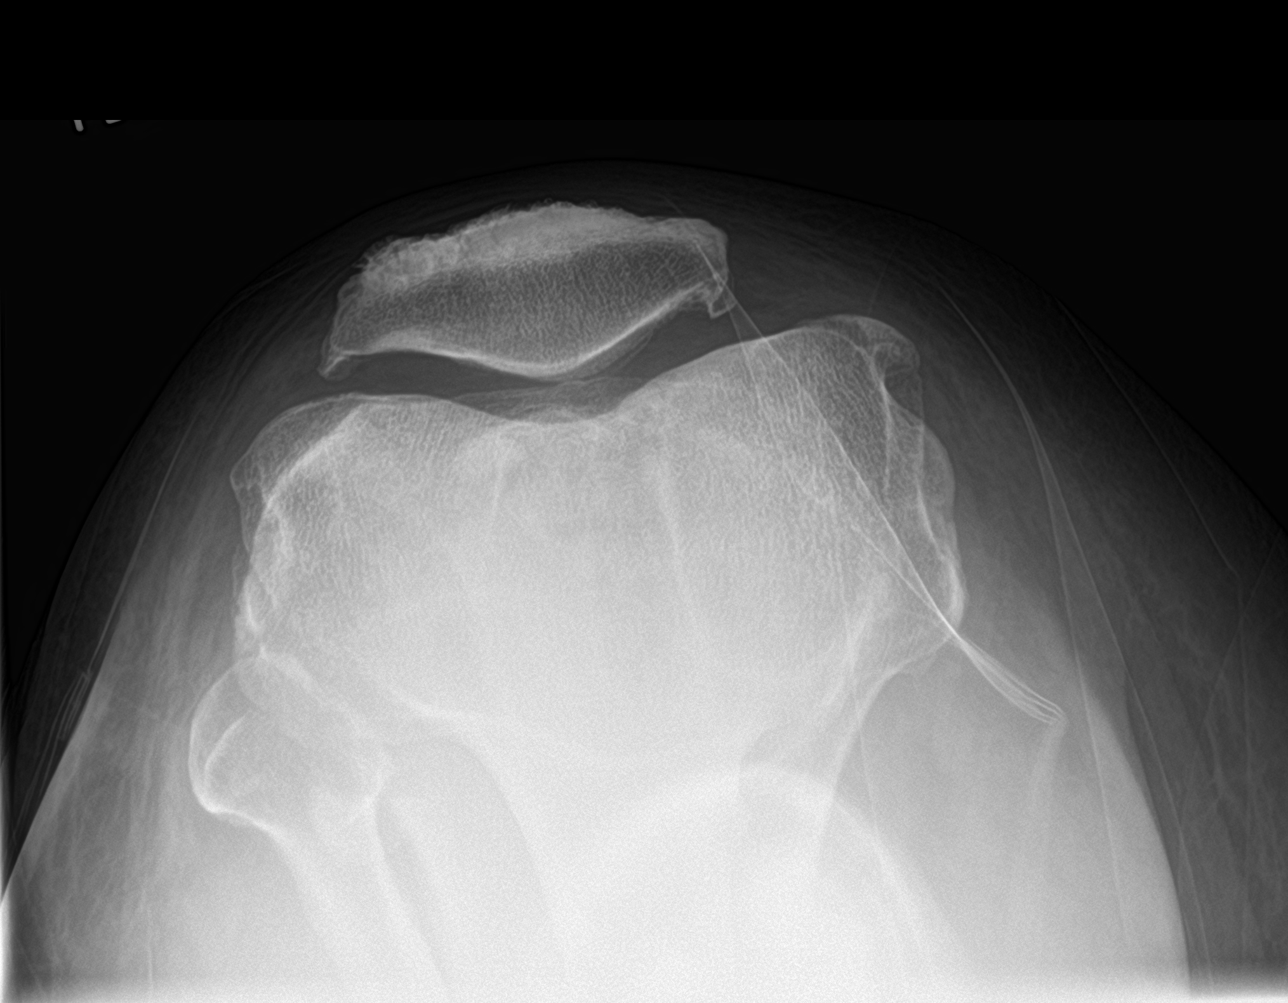

[4 of 4 positions shown; findings below may reference images not displayed]

FINDINGS: Tricompartment degenerative change. No acute abnormality. No
evidence of fracture or dislocation.
IMPRESSION: Tricompartment degenerative change. No acute abnormality identified.

## 2018-10-16 ENCOUNTER — Encounter: Payer: Self-pay | Admitting: Cardiology

## 2018-10-16 NOTE — Progress Notes (Signed)
Remote ICD transmission.   

## 2018-11-08 ENCOUNTER — Other Ambulatory Visit: Payer: Self-pay | Admitting: Family Medicine

## 2018-12-04 ENCOUNTER — Other Ambulatory Visit: Payer: Self-pay | Admitting: Family Medicine

## 2018-12-19 ENCOUNTER — Ambulatory Visit: Payer: BC Managed Care – PPO | Admitting: Family Medicine

## 2018-12-20 ENCOUNTER — Other Ambulatory Visit: Payer: Self-pay | Admitting: Family Medicine

## 2018-12-20 NOTE — Telephone Encounter (Signed)
Electronic refill request. Torsemide Last office visit:   07/30/2018 Last Filled:     90 tablet 0 09/23/2018          Notes to Pharmacy: Patient will need follow up in June/July before more refills will be given    Please advise.

## 2018-12-22 NOTE — Telephone Encounter (Signed)
Sent. Thanks.  He has follow-up scheduled for mid August.  That should be close enough.

## 2019-01-09 ENCOUNTER — Ambulatory Visit: Payer: BC Managed Care – PPO | Admitting: Family Medicine

## 2019-01-09 ENCOUNTER — Ambulatory Visit (INDEPENDENT_AMBULATORY_CARE_PROVIDER_SITE_OTHER): Payer: BC Managed Care – PPO | Admitting: *Deleted

## 2019-01-09 DIAGNOSIS — R001 Bradycardia, unspecified: Secondary | ICD-10-CM

## 2019-01-09 LAB — CUP PACEART REMOTE DEVICE CHECK
Battery Remaining Longevity: 116 mo
Battery Remaining Percentage: 95.5 %
Battery Voltage: 3.01 V
Brady Statistic AP VP Percent: 1 %
Brady Statistic AP VS Percent: 51 %
Brady Statistic AS VP Percent: 1 %
Brady Statistic AS VS Percent: 49 %
Brady Statistic RA Percent Paced: 49 %
Brady Statistic RV Percent Paced: 1 %
Date Time Interrogation Session: 20200813060021
Implantable Lead Implant Date: 20061219
Implantable Lead Implant Date: 20061219
Implantable Lead Location: 753859
Implantable Lead Location: 753860
Implantable Pulse Generator Implant Date: 20191204
Lead Channel Impedance Value: 1200 Ohm
Lead Channel Impedance Value: 490 Ohm
Lead Channel Pacing Threshold Amplitude: 1.125 V
Lead Channel Pacing Threshold Amplitude: 1.375 V
Lead Channel Pacing Threshold Pulse Width: 0.5 ms
Lead Channel Pacing Threshold Pulse Width: 0.5 ms
Lead Channel Sensing Intrinsic Amplitude: 10.1 mV
Lead Channel Sensing Intrinsic Amplitude: 5 mV
Lead Channel Setting Pacing Amplitude: 1.375
Lead Channel Setting Pacing Amplitude: 2.375
Lead Channel Setting Pacing Pulse Width: 0.5 ms
Lead Channel Setting Sensing Sensitivity: 2.5 mV
Pulse Gen Model: 2272
Pulse Gen Serial Number: 9090970

## 2019-01-20 ENCOUNTER — Encounter: Payer: Self-pay | Admitting: Cardiology

## 2019-01-20 NOTE — Progress Notes (Signed)
Remote pacemaker transmission.   

## 2019-01-21 ENCOUNTER — Ambulatory Visit: Payer: BC Managed Care – PPO | Admitting: Family Medicine

## 2019-01-23 ENCOUNTER — Encounter: Payer: Self-pay | Admitting: Family Medicine

## 2019-01-23 ENCOUNTER — Other Ambulatory Visit: Payer: Self-pay

## 2019-01-23 ENCOUNTER — Ambulatory Visit: Payer: BC Managed Care – PPO | Admitting: Family Medicine

## 2019-01-23 VITALS — BP 136/80 | HR 70 | Temp 98.2°F | Ht 71.5 in | Wt 382.1 lb

## 2019-01-23 DIAGNOSIS — R399 Unspecified symptoms and signs involving the genitourinary system: Secondary | ICD-10-CM | POA: Diagnosis not present

## 2019-01-23 DIAGNOSIS — G4733 Obstructive sleep apnea (adult) (pediatric): Secondary | ICD-10-CM | POA: Diagnosis not present

## 2019-01-23 DIAGNOSIS — E1140 Type 2 diabetes mellitus with diabetic neuropathy, unspecified: Secondary | ICD-10-CM

## 2019-01-23 DIAGNOSIS — M170 Bilateral primary osteoarthritis of knee: Secondary | ICD-10-CM

## 2019-01-23 DIAGNOSIS — E119 Type 2 diabetes mellitus without complications: Secondary | ICD-10-CM

## 2019-01-23 DIAGNOSIS — Z125 Encounter for screening for malignant neoplasm of prostate: Secondary | ICD-10-CM

## 2019-01-23 DIAGNOSIS — Z23 Encounter for immunization: Secondary | ICD-10-CM | POA: Diagnosis not present

## 2019-01-23 LAB — POCT GLYCOSYLATED HEMOGLOBIN (HGB A1C): Hemoglobin A1C: 9.3 % — AB (ref 4.0–5.6)

## 2019-01-23 LAB — PSA: PSA: 0.59 ng/mL (ref 0.10–4.00)

## 2019-01-23 MED ORDER — METFORMIN HCL 500 MG PO TABS: 500.0000 mg | ORAL_TABLET | Freq: Every day | ORAL | Status: DC

## 2019-01-23 MED ORDER — GLIPIZIDE 5 MG PO TABS: 5.0000 mg | ORAL_TABLET | Freq: Every day | ORAL | 1 refills | Status: DC

## 2019-01-23 NOTE — Progress Notes (Signed)
He was in [place] then [place] earlier this year.  He is working locally now.    Diabetes:  Using medications without difficulties: yes Hypoglycemic episodes:no Hyperglycemic episodes:no Feet problems: no, he is better than prev.   Blood Sugars averaging: usually ~ 170-210, lower later in the day eye exam within last year:  Due, d/w pt.   He has been on 500mg  metformin a day.  A1c d/w pt, up from prev.  He was expecting inc given upheaval over the last few months.  Pandemic considerations d/w pt.    Urinary sx, gradually worse in the last few years.  He has nocturia from torsemide- he can't work if he takes it in the AM.  He has urgency but has trouble with using a urinal due to lack of control. No burning with urination.    He used patellar straps and that helped with knee pain.    He is still on CPAP, compliant.    Meds, vitals, and allergies reviewed.  ROS: Per HPI unless specifically indicated in ROS section   GEN: nad, alert and oriented HEENT: ncat NECK: supple w/o LA CV: rrr. PULM: ctab, no inc wob ABD: soft, +bs EXT: trace BLE edema SKIN: no acute rash

## 2019-01-23 NOTE — Patient Instructions (Addendum)
Flu shot today.  Call about an eye exam when possible.  Go to the lab on the way out.  We'll contact you with your lab report. Let me get your PSA and we'll go from there.  Recheck in about 3 months at a physical.  Labs ahead of time.  Increase glipizide to 2 pills a day in the meantime.  Take care.  Glad to see you.

## 2019-01-26 MED ORDER — TAMSULOSIN HCL 0.4 MG PO CAPS: 0.4000 mg | ORAL_CAPSULE | Freq: Every day | ORAL | 3 refills | Status: DC

## 2019-01-26 NOTE — Assessment & Plan Note (Signed)
Likely with previous patellar ligament discomfort that improved with patella straps.

## 2019-01-26 NOTE — Assessment & Plan Note (Signed)
See above.  I wanted to check his labs before we made any changes.  PSA is still reasonable.  He can try Flomax and update me as needed.

## 2019-01-26 NOTE — Assessment & Plan Note (Signed)
Continue CPAP use

## 2019-01-26 NOTE — Assessment & Plan Note (Addendum)
He has been on 500mg  metformin a day.  A1c d/w pt, up from prev.  He was expecting inc given upheaval over the last few months.  Needs continued work on diet and exercise.  He can call about an eye exam. Recheck in about 3 months at a physical.  Labs ahead of time.  Increase glipizide to 2 pills a day in the meantime.  >25 minutes spent in face to face time with patient, >50% spent in counselling or coordination of care.

## 2019-02-06 ENCOUNTER — Encounter: Payer: Self-pay | Admitting: Family Medicine

## 2019-02-13 ENCOUNTER — Ambulatory Visit: Payer: BC Managed Care – PPO | Admitting: Nurse Practitioner

## 2019-02-13 ENCOUNTER — Other Ambulatory Visit: Payer: Self-pay

## 2019-02-13 ENCOUNTER — Encounter: Payer: Self-pay | Admitting: Family Medicine

## 2019-02-13 ENCOUNTER — Ambulatory Visit: Payer: BC Managed Care – PPO | Admitting: Family Medicine

## 2019-02-13 DIAGNOSIS — L0390 Cellulitis, unspecified: Secondary | ICD-10-CM

## 2019-02-13 MED ORDER — CEPHALEXIN 500 MG PO CAPS: 500.0000 mg | ORAL_CAPSULE | Freq: Three times a day (TID) | ORAL | 0 refills | Status: DC

## 2019-02-13 NOTE — Progress Notes (Signed)
Possibly cellulitis.  He had been started on flomax and also had some salty foods. He had leg pain due to swelling.  He had a rash prev on B shins that recently developed.  He had some leftover amoxil.  Started that in the meantime.  Clearly better now, less red and painful.  No fevers.    Neither he nor I thought the flomax would be causative.   Meds, vitals, and allergies reviewed.   ROS: Per HPI unless specifically indicated in ROS section   ncat rrr ctab Trace BLE edema.  Still with 7x3cm blanching red rash on the R shin, smaller lesion on the L shin.  No fluctuant mass.

## 2019-02-13 NOTE — Patient Instructions (Signed)
Start keflex and update me as needed.  Okay to restart flomax, I don't think that was related.   Keep working on your sugar and weight.  Take care.  Glad to see you.

## 2019-02-16 NOTE — Assessment & Plan Note (Signed)
He has risk factors for recurrent cellulitis given his weight, diabetes, edema.  Discussed. Start keflex and update me as needed.  It should be okay to restart flomax, I don't think that was related.   Advised him to keep working on sugar and weight.  Update me as needed.  Still okay for outpatient follow-up.  He agrees.  No fluctuant mass that would need incision and drainage.

## 2019-03-06 ENCOUNTER — Encounter: Payer: Self-pay | Admitting: Family Medicine

## 2019-03-07 ENCOUNTER — Telehealth: Payer: Self-pay | Admitting: Family Medicine

## 2019-03-07 MED ORDER — CEPHALEXIN 500 MG PO CAPS: 500.0000 mg | ORAL_CAPSULE | Freq: Three times a day (TID) | ORAL | 0 refills | Status: DC

## 2019-03-07 NOTE — Telephone Encounter (Addendum)
I spoke with pt; pt last office visit 02/13/19 after finished abx swelling of leg and all symptoms went away for about 1 wk after finishing abx. Has had swelling of both legs,redness, stretching of skin and feels like insects crawling inside skin and little sharpe pains. Pt said has had numerous episodes in last 3 years with always the same symptoms.pt said the symptoms are not as bad as last time but pt is calling early to keep symptoms from worsening. Pt will come in for appt but would be inconvenient and prefers abx to [facility].Pt has no covid symptoms, no travel and no known exposure to + covid. Pt request cb. ED precautions given and pt voiced understanding.FYI to Dr Damita Dunnings.

## 2019-03-07 NOTE — Telephone Encounter (Signed)
Please see mychart message below. Please triage patient.  Thanks.   =============== If I am a betting man I would say my cellulitis never totally left my body from when I was in a few weeks ago. I took all my medicine. It has all the signs again. Dry stretched skin on my lower legs and the sensation of sharp pains and things crawling inside my skin. Please advise. Thanks

## 2019-03-07 NOTE — Addendum Note (Signed)
Addended by: Tonia Ghent on: 03/07/2019 04:39 PM   Modules accepted: Orders

## 2019-03-07 NOTE — Telephone Encounter (Signed)
I spoke with patient to let him know that antibiotic was sent. I asked that he let us know if no improvement.  Pt verbalized understanding.

## 2019-03-07 NOTE — Telephone Encounter (Signed)
I sent the rx.  If not better, then needs recheck.  Thanks.

## 2019-03-08 ENCOUNTER — Other Ambulatory Visit: Payer: Self-pay | Admitting: Family Medicine

## 2019-03-24 ENCOUNTER — Other Ambulatory Visit: Payer: Self-pay | Admitting: Family Medicine

## 2019-04-04 ENCOUNTER — Encounter: Payer: Self-pay | Admitting: Family Medicine

## 2019-04-04 ENCOUNTER — Ambulatory Visit: Payer: PRIVATE HEALTH INSURANCE | Admitting: Family Medicine

## 2019-04-04 ENCOUNTER — Other Ambulatory Visit: Payer: Self-pay

## 2019-04-04 VITALS — BP 132/72 | HR 80 | Temp 97.6°F | Ht 71.5 in | Wt 377.6 lb

## 2019-04-04 DIAGNOSIS — E1140 Type 2 diabetes mellitus with diabetic neuropathy, unspecified: Secondary | ICD-10-CM | POA: Diagnosis not present

## 2019-04-04 DIAGNOSIS — L0390 Cellulitis, unspecified: Secondary | ICD-10-CM

## 2019-04-04 MED ORDER — CEPHALEXIN 500 MG PO CAPS: 500.0000 mg | ORAL_CAPSULE | Freq: Three times a day (TID) | ORAL | 0 refills | Status: DC

## 2019-04-04 NOTE — Patient Instructions (Addendum)
Restart keflex and update me if not better.  Restart metformin, hold the flomax and glipizide.   See if you can tolerate the metformin.   Either way, update me in about 10 days.  Plan on recheck A1c at a visit in 1 month. Take care.  Glad to see you.

## 2019-04-04 NOTE — Progress Notes (Signed)
H/o recurrent cellulitis.  Noted in the last few days, didn't feel well in general (which is typical for patient early in the process when he has had cellulitis in the past).  Sugar up to 300 this AM.  No fevers.  He restarted keflex in the last few days with improved redness.    He has been off metformin and tamsulosin and glipizide for about 1 month.  He has less swelling and until he had the symptoms above he felt better overall.  He has less joint aches in the meantime.  His feet hurt less in the meantime.  Sugar was prev <200 on metformin, >200 off med.   He is walking and drinking water to try to control his sugar.   Meds, vitals, and allergies reviewed.   ROS: Per HPI unless specifically indicated in ROS section   nad ncat Neck supple, no LA rrr ctab Abd soft, normal BS Pinkish changes to the B lower shins with trace edema.  No focal fluctuant mass.

## 2019-04-06 NOTE — Assessment & Plan Note (Signed)
He is at risk for multiple complications related to diabetes, cellulitis included, unless he gets better control.  Discussed options.  He thought he felt better off of his previous medications.  We do not know which medication was contributing.  I would restart metformin in the meantime.  If he notices an intolerance to that then he will let me know.  Either way I want him to update me in about 10 days and we will plan on rechecking an A1c at a visit in about 1 month.  See after visit summary.  He needs weight reduction with diet and exercise.  He agrees with plan.

## 2019-04-06 NOTE — Assessment & Plan Note (Signed)
This was similar to previous.  He is already some better with restarting Keflex.  Advised to finish a course of Keflex.  He is going to need risk factor modification, see diabetes discussion.

## 2019-04-10 ENCOUNTER — Encounter: Payer: BC Managed Care – PPO | Admitting: *Deleted

## 2019-04-14 ENCOUNTER — Telehealth: Payer: Self-pay

## 2019-04-14 NOTE — Telephone Encounter (Signed)
Spoke with patient to remind of missed remote transmission 

## 2019-04-15 ENCOUNTER — Encounter: Payer: Self-pay | Admitting: Family Medicine

## 2019-04-17 ENCOUNTER — Other Ambulatory Visit: Payer: Self-pay | Admitting: Family Medicine

## 2019-05-02 ENCOUNTER — Encounter: Payer: Self-pay | Admitting: Family Medicine

## 2019-05-04 ENCOUNTER — Other Ambulatory Visit: Payer: Self-pay | Admitting: Family Medicine

## 2019-05-04 MED ORDER — CEPHALEXIN 500 MG PO CAPS: 500.0000 mg | ORAL_CAPSULE | Freq: Three times a day (TID) | ORAL | 0 refills | Status: DC

## 2019-07-08 NOTE — Progress Notes (Deleted)
.    Cardiology Office Note Date:  07/08/2019  Patient ID:  Glenn Lyons, Glenn Lyons 1956/02/05, MRN [id number] PCP:  Tonia Ghent, MD  Electrophysiologist: Dr. Caryl Comes GI: Dr. Silverio Decamp   Chief Complaint: *** device visit  History of Present Illness: Glenn Lyons is a 64 y.o. 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  Note by Dr. Caryl Comes in May 2018 discussed exercise tolerance limited by knees and obesity, and his OSA had reached the limits of his CPAP.  Had required recurrent esophageal dilation. discussion on the need for weight reduction and value of weight loss reduction surgery, was provided website information for the weight loss program at [facility].  Planned for an echo, particularly to revisit RV/p.HTN/pressures.  His pacer was reprogrammed rate response to off.  The echo noted LVEF 60-65%, grade II DD (described as extremely technically difficult echo).  Was unable to assess right sided pressure, urged weight loss efforts and continued diuretics.  He last saw Dr. Caryl Comes May 2020 via tele-health, again encouraged weight loss, reports of mild volume OL, no changes were made.  *** symptoms *** volume *** meds *** labs, lytes    DEVICE information:  STJ dual chamber PPM, 05/06/05, gen change 2020, Dr. Caryl Comes  Past Medical History:  Diagnosis Date  . Achalasia    with prev eval at [facility].  No intervention as of 2012  . Aspiration pneumonia ([place]) 10/05/2014  . Backache, unspecified   . Cardiac pacemaker St. Jude    ERI 2008  . Depressive disorder, not elsewhere classified   . Diabetes ([place])   . Esophageal reflux   . Morbid obesity ([place])   . Obstructive sleep apnea    biapap settign 25-22  . Osteoarthrosis, unspecified whether generalized or localized, unspecified site   .  Osteoarthrosis, unspecified whether generalized or localized, unspecified site   . Presence of permanent cardiac pacemaker    St. Jude- Dr. Caryl Comes follows -device Check 07-06-14  . Sinus bradycardia /pauses   . Stricture and stenosis of esophagus     Past Surgical History:  Procedure Laterality Date  . BALLOON DILATION N/A 10/06/2015   Procedure: BALLOON DILATION;  Surgeon: Mauri Pole, MD;  Location: [facility];  Service: Endoscopy;  Laterality: N/A;  . BIOPSY  05/06/2018   Procedure: BIOPSY;  Surgeon: Rush Landmark Telford Nab., MD;  Location: [facility];  Service: Gastroenterology;;  . COLONOSCOPY W/ POLYPECTOMY  11/01/2010  . COLONOSCOPY WITH PROPOFOL N/A 10/05/2014   Procedure: COLONOSCOPY WITH PROPOFOL;  Surgeon: Gatha Mayer, MD;  [facility];  Service: Endoscopy;  Laterality: N/A;  . COLONOSCOPY WITH PROPOFOL N/A 05/06/2018   Procedure: COLONOSCOPY WITH PROPOFOL;  Surgeon: Rush Landmark Telford Nab., MD;  Location: [facility];  Service: Gastroenterology;  Laterality: N/A;  . ESOPHAGEAL DILATION    . ESOPHAGEAL MANOMETRY N/A 08/23/2015   Procedure: ESOPHAGEAL MANOMETRY (EM);  Surgeon: Mauri Pole, MD;  [facility];  Service: Endoscopy;  Laterality: N/A;  . ESOPHAGOGASTRODUODENOSCOPY N/A 10/09/2014   Procedure: ESOPHAGOGASTRODUODENOSCOPY (EGD);  Surgeon: Gatha Mayer, MD;  Location: [facility];  Service: Endoscopy;  Laterality: N/A;  . ESOPHAGOGASTRODUODENOSCOPY (EGD) WITH PROPOFOL N/A 08/23/2015   Procedure: ESOPHAGOGASTRODUODENOSCOPY (EGD) WITH PROPOFOL;  Surgeon: Mauri Pole, MD;  [facility];  Service: Endoscopy;  Laterality: N/A;  . ESOPHAGOGASTRODUODENOSCOPY (EGD) WITH PROPOFOL N/A  10/06/2015   Procedure: ESOPHAGOGASTRODUODENOSCOPY (EGD) WITH PROPOFOL;  Surgeon: Mauri Pole, MD;  Location: [facility];  Service: Endoscopy;  Laterality: N/A;  Rigiflex ballon size 40m-35mm size 45 minute proc,need Fluro Gastografin esophagram  2 hrs post EGD   . PACEMAKER INSERTION    . POLYPECTOMY  05/06/2018   Procedure: POLYPECTOMY;  Surgeon: Mansouraty, GTelford Nab, MD;  [facility]  Service: Gastroenterology;;  . PBetsy PriesGENERATOR CHANGEOUT N/A 05/01/2018   Procedure: PPM GENERATOR CHANGEOUT;  Surgeon: KDeboraha Sprang MD;  [facility];  Service: Cardiovascular;  Laterality: N/A;    Current Outpatient Medications  Medication Sig Dispense Refill  . aspirin 81 MG tablet Take 81 mg by mouth daily.      .Marland Kitchenatorvastatin (LIPITOR) 10 MG tablet TAKE 1 TABLET BY MOUTH EVERY DAY 90 tablet 3  . Blood Glucose Monitoring Suppl (ONETOUCH VERIO) w/Device KIT Inject 1 kit into the skin every morning. Use to test blood sugar each day before breakfast and 2 hours after a meal for 2 weeks.  Diagnosis:  E11.9  Non-insulin dependent. 1 kit 0  . cephALEXin (KEFLEX) 500 MG capsule Take 1 capsule (500 mg total) by mouth 3 (three) times daily. 21 capsule 0  . cetirizine (ZYRTEC) 10 MG tablet Take 10 mg by mouth daily.     . citalopram (CELEXA) 20 MG tablet TAKE 1 TABLET BY MOUTH EVERY DAY 90 tablet 0  . Emollient (CERAVE) CREA Apply 1 application topically 2 (two) times daily.  0  . glipiZIDE (GLUCOTROL) 5 MG tablet Take 1-2 tablets (5-10 mg total) by mouth daily before breakfast. (Patient not taking: Reported on 04/04/2019) 180 tablet 1  . ibuprofen (ADVIL,MOTRIN) 200 MG tablet Take 400-800 mg by mouth every 6 (six) hours as needed for headache or moderate pain.    . Lancets (ONETOUCH ULTRASOFT) lancets USE TO TEST BLOOD SUGAR ONCE DAILY OR AS NEEDED 100 each 3  . Multiple Vitamin (MULTIVITAMIN WITH MINERALS) TABS tablet Take 1 tablet by mouth daily.    .Glory RosebushVERIO test strip USE TO TEST BLOOD SUGAR ONCE DAILY OR AS INSTRUCTED 100 each 3  . torsemide (DEMADEX) 20 MG tablet TAKE 1 TABLET (20 MG TOTAL) BY MOUTH DAILY AS NEEDED (FOR SWELLING). 90 tablet 0   No current facility-administered medications for this visit.     Allergies:   Morphine and Metformin and related   Social History:  The patient  reports that he quit smoking about 15 years ago. His smoking use included cigarettes. He has a 20.00 pack-year smoking history. He has never used smokeless tobacco. He reports current alcohol use. He reports that he does not use drugs.   Family History:  The patient's family history includes Cancer in his father; Dementia in his mother; Heart attack in his father; Heart disease in his father; Hypertension in his father and mother; Prostate cancer in his father.  ROS:  Please see the history of present illness.  All other systems are reviewed and otherwise negative.   PHYSICAL EXAM:  VS:  There were no vitals taken for this visit. BMI: There is no height or weight on file to calculate BMI. Very pleasent, morbidly obese WM, in no acute distress  HEENT: normocephalic, atraumatic  Neck: no JVD, carotid bruits or masses Cardiac:  *** RRR; no significant murmurs, no rubs, or gallops Lungs:  *** CTA b/l, no wheezing, rhonchi or rales  Abd: soft, nontender MS: no deformity or *** atrophy Ext:  ***  trace edema the patient reports as chronic, wearing support stockings Skin: warm and dry, no rash Neuro:  No gross deficits appreciated Psych: euthymic mood, full affect  *** PPM site (right chest) is stable, no tethering or discomfort   EKG:  Done today and reviewed by myself   PPM interrogation done today and reviewed by myself: ***    03/13/17: TTE Study Conclusions - Left ventricle: The cavity size was normal. Wall thickness was   increased in a pattern of moderate LVH. Systolic function was   normal. The estimated ejection fraction was in the range of 60%   to 65%. Features are consistent with a pseudonormal left   ventricular filling pattern, with concomitant abnormal relaxation   and increased filling pressure (grade 2 diastolic dysfunction). - Right atrium: The atrium was mildly  dilated. Impressions: - Extremely technically difficult echo .    Recent Labs: No results found for requested labs within last 8760 hours.  No results found for requested labs within last 8760 hours.   CrCl cannot be calculated (Patient's most recent lab result is older than the maximum 21 days allowed.).   Wt Readings from Last 3 Encounters:  04/04/19 (!) 377 lb 9 oz (171.3 kg)  02/13/19 (!) 382 lb 1 oz (173.3 kg)  01/23/19 (!) 382 lb 2 oz (173.3 kg)     Other studies reviewed: Additional studies/records reviewed today include: summarized above      ASSESSMENT AND PLAN:  1. Bradycardia, PPM     ***  2. Dependent LE edema, morbid obesity 3. HFpEF     *** reported as chronic and unchanged for many years               Disposition:plan for gen change and routine follow, this is scheduled prior to his planned colonoscopy late next month  Current medicines are reviewed at length with the patient today.  The patient did not have any concerns regarding medicines.  Haywood Lasso, PA-C 07/08/2019 12:20 PM     [address] [place] [address] [phone] (office)  [phone] (fax)

## 2019-07-09 ENCOUNTER — Encounter: Payer: PRIVATE HEALTH INSURANCE | Admitting: Physician Assistant

## 2019-07-11 ENCOUNTER — Encounter: Payer: PRIVATE HEALTH INSURANCE | Admitting: Physician Assistant

## 2019-07-13 ENCOUNTER — Other Ambulatory Visit: Payer: Self-pay | Admitting: Family Medicine

## 2019-07-14 NOTE — Telephone Encounter (Signed)
Received refill request electronically  Last office visit 04/04/19 Last refill 03/24/19 #90 No upcoming appointment scheduled

## 2019-07-15 ENCOUNTER — Encounter: Payer: Self-pay | Admitting: Family Medicine

## 2019-07-15 NOTE — Telephone Encounter (Signed)
Sent.  He needs a physical scheduled with labs ahead of time.

## 2019-07-15 NOTE — Telephone Encounter (Signed)
Letter mailed

## 2019-07-22 NOTE — Progress Notes (Signed)
.    Cardiology Office Note Date:  07/22/2019  Patient ID:  Glenn Lyons, Glenn Lyons November 27, 1955, MRN [id number] PCP:  Tonia Ghent, MD  Electrophysiologist: Dr. Caryl Comes GI: Dr. Silverio Decamp   Chief Complaint: device visit  History of Present Illness: Glenn Lyons is a 64 y.o. 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  Note by Dr. Caryl Comes in May 2018 discussed exercise tolerance limited by knees and obesity, and his OSA had reached the limits of his CPAP.  Had required recurrent esophageal dilation. discussion on the need for weight reduction and value of weight loss reduction surgery, was provided website information for the weight loss program at [facility].  Planned for an echo, particularly to revisit RV/p.HTN/pressures.  His pacer was reprogrammed rate response to off.  The echo noted LVEF 60-65%, grade II DD (described as extremely technically difficult echo).  Was unable to assess right sided pressure, urged weight loss efforts and continued diuretics.  He last saw Dr. Caryl Comes May 2020 via tele-health, again encouraged weight loss, reports of mild volume OL, no changes were made.  He is doing well.  No CP, palpitations or SOB.  No dizzy spells, near syncope or syncope. He denies any issues with swelling or bloating, does not feel like he is retaining water.  As part of this discussion, turns out he has been taking the torsemide daily, not PRN He reports his PMD monitors and manages his lipids, does his labs  DEVICE information:  STJ dual chamber PPM, 05/06/05, gen change 2020, Dr. Caryl Comes  Past Medical History:  Diagnosis Date  . Achalasia    with prev eval at [facility].  No intervention as of 2012  . Aspiration pneumonia ([place]) 10/05/2014  . Backache, unspecified   . Cardiac pacemaker St. Jude    ERI 2008  . Depressive disorder, not elsewhere classified   . Diabetes ([place])   . Esophageal reflux   . Morbid obesity ([place])   . Obstructive sleep apnea    biapap settign 25-22  . Osteoarthrosis, unspecified whether generalized or localized, unspecified site   . Osteoarthrosis, unspecified whether generalized or localized, unspecified site   . Presence of permanent cardiac pacemaker    St. Jude- Dr. Caryl Comes follows -device Check 07-06-14  . Sinus bradycardia /pauses   . Stricture and stenosis of esophagus     Past Surgical History:  Procedure Laterality Date  . BALLOON DILATION N/A 10/06/2015   Procedure: BALLOON DILATION;  Surgeon: Mauri Pole, MD;  Location: [facility];  Service: Endoscopy;  Laterality: N/A;  . BIOPSY  05/06/2018   Procedure: BIOPSY;  Surgeon: Rush Landmark Telford Nab., MD;  Location: [facility];  Service: Gastroenterology;;  . COLONOSCOPY W/ POLYPECTOMY  11/01/2010  . COLONOSCOPY WITH PROPOFOL N/A 10/05/2014   Procedure: COLONOSCOPY WITH PROPOFOL;  Surgeon: Gatha Mayer, MD;  [facility];  Service: Endoscopy;  Laterality: N/A;  . COLONOSCOPY WITH PROPOFOL N/A 05/06/2018   Procedure: COLONOSCOPY WITH PROPOFOL;  Surgeon: Rush Landmark Telford Nab., MD;  Location: [facility];  Service: Gastroenterology;  Laterality: N/A;  . ESOPHAGEAL DILATION    . ESOPHAGEAL MANOMETRY N/A 08/23/2015   Procedure: ESOPHAGEAL MANOMETRY (EM);  Surgeon: Mauri Pole, MD;  [facility];  Service: Endoscopy;  Laterality: N/A;  . ESOPHAGOGASTRODUODENOSCOPY N/A 10/09/2014   Procedure: ESOPHAGOGASTRODUODENOSCOPY (EGD);  Surgeon: Ofilia Neas  Carlean Purl, MD;  [facility];  Service: Endoscopy;  Laterality: N/A;  . ESOPHAGOGASTRODUODENOSCOPY (EGD) WITH PROPOFOL N/A 08/23/2015   Procedure: ESOPHAGOGASTRODUODENOSCOPY (EGD) WITH PROPOFOL;  Surgeon: Mauri Pole, MD;  [facility];  Service: Endoscopy;  Laterality: N/A;  . ESOPHAGOGASTRODUODENOSCOPY (EGD) WITH  PROPOFOL N/A 10/06/2015   Procedure: ESOPHAGOGASTRODUODENOSCOPY (EGD) WITH PROPOFOL;  Surgeon: Mauri Pole, MD;  Location: [facility];  Service: Endoscopy;  Laterality: N/A;  Rigiflex ballon size 63m-35mm size 45 minute proc,need Fluro Gastografin esophagram 2 hrs post EGD   . PACEMAKER INSERTION    . POLYPECTOMY  05/06/2018   Procedure: POLYPECTOMY;  Surgeon: Mansouraty, GTelford Nab, MD;  [facility] [facility]  Service: Gastroenterology;;  . PBetsy PriesGENERATOR CHANGEOUT N/A 05/01/2018   Procedure: PPM GENERATOR CHANGEOUT;  Surgeon: KDeboraha Sprang MD;  [facility];  Service: Cardiovascular;  Laterality: N/A;    Current Outpatient Medications  Medication Sig Dispense Refill  . aspirin 81 MG tablet Take 81 mg by mouth daily.      .Marland Kitchenatorvastatin (LIPITOR) 10 MG tablet TAKE 1 TABLET BY MOUTH EVERY DAY 90 tablet 3  . Blood Glucose Monitoring Suppl (ONETOUCH VERIO) w/Device KIT Inject 1 kit into the skin every morning. Use to test blood sugar each day before breakfast and 2 hours after a meal for 2 weeks.  Diagnosis:  E11.9  Non-insulin dependent. 1 kit 0  . cephALEXin (KEFLEX) 500 MG capsule Take 1 capsule (500 mg total) by mouth 3 (three) times daily. 21 capsule 0  . cetirizine (ZYRTEC) 10 MG tablet Take 10 mg by mouth daily.     . citalopram (CELEXA) 20 MG tablet TAKE 1 TABLET BY MOUTH EVERY DAY 90 tablet 0  . Emollient (CERAVE) CREA Apply 1 application topically 2 (two) times daily.  0  . glipiZIDE (GLUCOTROL) 5 MG tablet Take 1-2 tablets (5-10 mg total) by mouth daily before breakfast. (Patient not taking: Reported on 04/04/2019) 180 tablet 1  . ibuprofen (ADVIL,MOTRIN) 200 MG tablet Take 400-800 mg by mouth every 6 (six) hours as needed for headache or moderate pain.    . Lancets (ONETOUCH ULTRASOFT) lancets USE TO TEST BLOOD SUGAR ONCE DAILY OR AS NEEDED 100 each 3  . Multiple Vitamin (MULTIVITAMIN WITH MINERALS) TABS tablet Take 1 tablet by mouth daily.    .Glory Rosebush VERIO test strip USE TO TEST BLOOD SUGAR ONCE DAILY OR AS INSTRUCTED 100 each 3  . torsemide (DEMADEX) 20 MG tablet TAKE 1 TABLET (20 MG TOTAL) BY MOUTH DAILY AS NEEDED (FOR SWELLING). 30 tablet 0   No current facility-administered medications for this visit.    Allergies:   Morphine and Metformin and related   Social History:  The patient  reports that he quit smoking about 15 years ago. His smoking use included cigarettes. He has a 20.00 pack-year smoking history. He has never used smokeless tobacco. He reports current alcohol use. He reports that he does not use drugs.   Family History:  The patient's family history includes Cancer in his father; Dementia in his mother; Heart attack in his father; Heart disease in his father; Hypertension in his father and mother; Prostate cancer in his father.  ROS:  Please see the history of present illness.  All other systems are reviewed and otherwise negative.   PHYSICAL EXAM:  VS:  There were no vitals taken for this visit. BMI: There is no height or weight on file to calculate BMI. Very pleasent, morbidly obese WM,  in no acute distress  HEENT: normocephalic, atraumatic  Neck: no JVD, carotid bruits or masses Cardiac:  RRR; no significant murmurs, no rubs, or gallops Lungs:  CTA b/l, no wheezing, rhonchi or rales  Abd: soft, nontender, obese MS: no deformity or atrophy Ext:  trace edema the patient reports as chronic, wearing support stockings Skin: warm and dry, no rash Neuro:  No gross deficits appreciated Psych: euthymic mood, full affect  PPM site (right chest) is stable, no tethering or discomfort   EKG:  Done today and reviewed by myself   PPM interrogation done today and reviewed by myself:  Battery and lead measurements are good RA lead impedance had been trending up slightly appears to have leveled off, threshold/sensing is stable One AMS episode 48 seconds of AFib    03/13/17: TTE Study Conclusions - Left ventricle: The  cavity size was normal. Wall thickness was   increased in a pattern of moderate LVH. Systolic function was   normal. The estimated ejection fraction was in the range of 60%   to 65%. Features are consistent with a pseudonormal left   ventricular filling pattern, with concomitant abnormal relaxation   and increased filling pressure (grade 2 diastolic dysfunction). - Right atrium: The atrium was mildly dilated. Impressions: - Extremely technically difficult echo .    Recent Labs: No results found for requested labs within last 8760 hours.  No results found for requested labs within last 8760 hours.   CrCl cannot be calculated (Patient's most recent lab result is older than the maximum 21 days allowed.).   Wt Readings from Last 3 Encounters:  04/04/19 (!) 377 lb 9 oz (171.3 kg)  02/13/19 (!) 382 lb 1 oz (173.3 kg)  01/23/19 (!) 382 lb 2 oz (173.3 kg)     Other studies reviewed: Additional studies/records reviewed today include: summarized above      ASSESSMENT AND PLAN:  1. Bradycardia, PPM     Intact function, no programming changes made  2. Dependent LE edema, morbid obesity 3. HFpEF     Trace, more brawny type edema reported as chronic and unchanged for many years     He has been taking torsemide daily     BMET today  4. Afib     SCAF noted today, 48 seconds     Continue to monitor via his device          Disposition:plan for gen change and routine follow, this is scheduled prior to his planned colonoscopy late next month  Current medicines are reviewed at length with the patient today.  The patient did not have any concerns regarding medicines.  Haywood Lasso, PA-C 07/22/2019 7:59 PM     [place] [address] [phone] (office)  [phone] (fax)

## 2019-07-22 NOTE — Progress Notes (Deleted)
.    Cardiology Office Note Date:  07/22/2019  Patient ID:  Glenn, Lyons 1956-05-14, MRN [id number] PCP:  Tonia Ghent, MD  Electrophysiologist: Dr. Caryl Comes GI: Dr. Silverio Decamp   Chief Complaint: *** device visit  History of Present Illness: Glenn Lyons is a 64 y.o. 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  Note by Dr. Caryl Comes in May 2018 discussed exercise tolerance limited by knees and obesity, and his OSA had reached the limits of his CPAP.  Had required recurrent esophageal dilation. discussion on the need for weight reduction and value of weight loss reduction surgery, was provided website information for the weight loss program at [facility].  Planned for an echo, particularly to revisit RV/p.HTN/pressures.  His pacer was reprogrammed rate response to off.  The echo noted LVEF 60-65%, grade II DD (described as extremely technically difficult echo).  Was unable to assess right sided pressure, urged weight loss efforts and continued diuretics.  He last saw Dr. Caryl Comes May 2020 via tele-health, again encouraged weight loss, reports of mild volume OL, no changes were made.  *** symptoms *** volume *** meds *** labs, lytes    DEVICE information:  STJ dual chamber PPM, 05/06/05, gen change 2020, Dr. Caryl Comes  Past Medical History:  Diagnosis Date  . Achalasia    with prev eval at [facility].  No intervention as of 2012  . Aspiration pneumonia ([place]) 10/05/2014  . Backache, unspecified   . Cardiac pacemaker St. Jude    ERI 2008  . Depressive disorder, not elsewhere classified   . Diabetes ([place])   . Esophageal reflux   . Morbid obesity ([place])   . Obstructive sleep apnea    biapap settign 25-22  . Osteoarthrosis, unspecified whether generalized or localized, unspecified site   .  Osteoarthrosis, unspecified whether generalized or localized, unspecified site   . Presence of permanent cardiac pacemaker    St. Jude- Dr. Caryl Comes follows -device Check 07-06-14  . Sinus bradycardia /pauses   . Stricture and stenosis of esophagus     Past Surgical History:  Procedure Laterality Date  . BALLOON DILATION N/A 10/06/2015   Procedure: BALLOON DILATION;  Surgeon: Mauri Pole, MD;  Location: [facility];  Service: Endoscopy;  Laterality: N/A;  . BIOPSY  05/06/2018   Procedure: BIOPSY;  Surgeon: Rush Landmark Telford Nab., MD;  Location: [place];  Service: Gastroenterology;;  . COLONOSCOPY W/ POLYPECTOMY  11/01/2010  . COLONOSCOPY WITH PROPOFOL N/A 10/05/2014   Procedure: COLONOSCOPY WITH PROPOFOL;  Surgeon: Gatha Mayer, MD;  Location: [facility];  Service: Endoscopy;  Laterality: N/A;  . COLONOSCOPY WITH PROPOFOL N/A 05/06/2018   Procedure: COLONOSCOPY WITH PROPOFOL;  Surgeon: Rush Landmark Telford Nab., MD;  Location: [place];  Service: Gastroenterology;  Laterality: N/A;  . ESOPHAGEAL DILATION    . ESOPHAGEAL MANOMETRY N/A 08/23/2015   Procedure: ESOPHAGEAL MANOMETRY (EM);  Surgeon: Mauri Pole, MD;  Location: [facility];  Service: Endoscopy;  Laterality: N/A;  . ESOPHAGOGASTRODUODENOSCOPY N/A 10/09/2014   Procedure: ESOPHAGOGASTRODUODENOSCOPY (EGD);  Surgeon: Gatha Mayer, MD;  Location: [facility];  Service: Endoscopy;  Laterality: N/A;  . ESOPHAGOGASTRODUODENOSCOPY (EGD) WITH PROPOFOL N/A 08/23/2015   Procedure: ESOPHAGOGASTRODUODENOSCOPY (EGD) WITH PROPOFOL;  Surgeon: Mauri Pole, MD;  Location: [facility];  Service: Endoscopy;  Laterality: N/A;  . ESOPHAGOGASTRODUODENOSCOPY (EGD) WITH PROPOFOL N/A  10/06/2015   Procedure: ESOPHAGOGASTRODUODENOSCOPY (EGD) WITH PROPOFOL;  Surgeon: Mauri Pole, MD;  Location: [facility];  Service: Endoscopy;  Laterality: N/A;  Rigiflex ballon size 60m-35mm size 45 minute proc,need Fluro Gastografin esophagram  2 hrs post EGD   . PACEMAKER INSERTION    . POLYPECTOMY  05/06/2018   Procedure: POLYPECTOMY;  Surgeon: Mansouraty, GTelford Nab, MD;  [place] [facility]  Service: Gastroenterology;;  . PBetsy PriesGENERATOR CHANGEOUT N/A 05/01/2018   Procedure: PPM GENERATOR CHANGEOUT;  Surgeon: KDeboraha Sprang MD;  [facility];  Service: Cardiovascular;  Laterality: N/A;    Current Outpatient Medications  Medication Sig Dispense Refill  . aspirin 81 MG tablet Take 81 mg by mouth daily.      .Marland Kitchenatorvastatin (LIPITOR) 10 MG tablet TAKE 1 TABLET BY MOUTH EVERY DAY 90 tablet 3  . Blood Glucose Monitoring Suppl (ONETOUCH VERIO) w/Device KIT Inject 1 kit into the skin every morning. Use to test blood sugar each day before breakfast and 2 hours after a meal for 2 weeks.  Diagnosis:  E11.9  Non-insulin dependent. 1 kit 0  . cephALEXin (KEFLEX) 500 MG capsule Take 1 capsule (500 mg total) by mouth 3 (three) times daily. 21 capsule 0  . cetirizine (ZYRTEC) 10 MG tablet Take 10 mg by mouth daily.     . citalopram (CELEXA) 20 MG tablet TAKE 1 TABLET BY MOUTH EVERY DAY 90 tablet 0  . Emollient (CERAVE) CREA Apply 1 application topically 2 (two) times daily.  0  . glipiZIDE (GLUCOTROL) 5 MG tablet Take 1-2 tablets (5-10 mg total) by mouth daily before breakfast. (Patient not taking: Reported on 04/04/2019) 180 tablet 1  . ibuprofen (ADVIL,MOTRIN) 200 MG tablet Take 400-800 mg by mouth every 6 (six) hours as needed for headache or moderate pain.    . Lancets (ONETOUCH ULTRASOFT) lancets USE TO TEST BLOOD SUGAR ONCE DAILY OR AS NEEDED 100 each 3  . Multiple Vitamin (MULTIVITAMIN WITH MINERALS) TABS tablet Take 1 tablet by mouth daily.    .Glory RosebushVERIO test strip USE TO TEST BLOOD SUGAR ONCE DAILY OR AS INSTRUCTED 100 each 3  . torsemide (DEMADEX) 20 MG tablet TAKE 1 TABLET (20 MG TOTAL) BY MOUTH DAILY AS NEEDED (FOR SWELLING). 30 tablet 0   No current facility-administered medications for this visit.     Allergies:   Morphine and Metformin and related   Social History:  The patient  reports that he quit smoking about 15 years ago. His smoking use included cigarettes. He has a 20.00 pack-year smoking history. He has never used smokeless tobacco. He reports current alcohol use. He reports that he does not use drugs.   Family History:  The patient's family history includes Cancer in his father; Dementia in his mother; Heart attack in his father; Heart disease in his father; Hypertension in his father and mother; Prostate cancer in his father.  ROS:  Please see the history of present illness.  All other systems are reviewed and otherwise negative.   PHYSICAL EXAM:  VS:  There were no vitals taken for this visit. BMI: There is no height or weight on file to calculate BMI. Very pleasent, morbidly obese WM, in no acute distress  HEENT: normocephalic, atraumatic  Neck: no JVD, carotid bruits or masses Cardiac:  *** RRR; no significant murmurs, no rubs, or gallops Lungs:  *** CTA b/l, no wheezing, rhonchi or rales  Abd: soft, nontender MS: no deformity or *** atrophy Ext:  ***  trace edema the patient reports as chronic, wearing support stockings Skin: warm and dry, no rash Neuro:  No gross deficits appreciated Psych: euthymic mood, full affect  *** PPM site (right chest) is stable, no tethering or discomfort   EKG:  Done today and reviewed by myself   PPM interrogation done today and reviewed by myself: ***    03/13/17: TTE Study Conclusions - Left ventricle: The cavity size was normal. Wall thickness was   increased in a pattern of moderate LVH. Systolic function was   normal. The estimated ejection fraction was in the range of 60%   to 65%. Features are consistent with a pseudonormal left   ventricular filling pattern, with concomitant abnormal relaxation   and increased filling pressure (grade 2 diastolic dysfunction). - Right atrium: The atrium was mildly  dilated. Impressions: - Extremely technically difficult echo .    Recent Labs: No results found for requested labs within last 8760 hours.  No results found for requested labs within last 8760 hours.   CrCl cannot be calculated (Patient's most recent lab result is older than the maximum 21 days allowed.).   Wt Readings from Last 3 Encounters:  04/04/19 (!) 377 lb 9 oz (171.3 kg)  02/13/19 (!) 382 lb 1 oz (173.3 kg)  01/23/19 (!) 382 lb 2 oz (173.3 kg)     Other studies reviewed: Additional studies/records reviewed today include: summarized above      ASSESSMENT AND PLAN:  1. Bradycardia, PPM     ***  2. Dependent LE edema, morbid obesity 3. HFpEF     *** reported as chronic and unchanged for many years               Disposition:plan for gen change and routine follow, this is scheduled prior to his planned colonoscopy late next month  Current medicines are reviewed at length with the patient today.  The patient did not have any concerns regarding medicines.  Haywood Lasso, PA-C 07/22/2019 6:22 AM     [place] [address] [place] [id number] [phone] (office)  [phone] (fax)

## 2019-07-23 ENCOUNTER — Encounter: Payer: PRIVATE HEALTH INSURANCE | Admitting: Physician Assistant

## 2019-07-24 ENCOUNTER — Ambulatory Visit: Payer: BC Managed Care – PPO | Admitting: Physician Assistant

## 2019-07-24 ENCOUNTER — Other Ambulatory Visit: Payer: Self-pay

## 2019-07-24 VITALS — BP 172/84 | HR 61 | Ht 71.0 in | Wt 383.0 lb

## 2019-07-24 DIAGNOSIS — I480 Paroxysmal atrial fibrillation: Secondary | ICD-10-CM

## 2019-07-24 DIAGNOSIS — I5032 Chronic diastolic (congestive) heart failure: Secondary | ICD-10-CM

## 2019-07-24 DIAGNOSIS — Z79899 Other long term (current) drug therapy: Secondary | ICD-10-CM

## 2019-07-24 DIAGNOSIS — Z950 Presence of cardiac pacemaker: Secondary | ICD-10-CM

## 2019-07-24 LAB — BASIC METABOLIC PANEL
BUN/Creatinine Ratio: 14 (ref 10–24)
BUN: 13 mg/dL (ref 8–27)
CO2: 25 mmol/L (ref 20–29)
Calcium: 9.8 mg/dL (ref 8.6–10.2)
Chloride: 99 mmol/L (ref 96–106)
Creatinine, Ser: 0.93 mg/dL (ref 0.76–1.27)
GFR calc Af Amer: 101 mL/min/{1.73_m2} (ref >59)
GFR calc non Af Amer: 88 mL/min/{1.73_m2} (ref >59)
Glucose: 265 mg/dL — ABNORMAL HIGH (ref 65–99)
Potassium: 4.6 mmol/L (ref 3.5–5.2)
Sodium: 138 mmol/L (ref 134–144)

## 2019-07-24 NOTE — Patient Instructions (Addendum)
Medication Instructions:   Your physician recommends that you continue on your current medications as directed. Please refer to the Current Medication list given to you today.  *If you need a refill on your cardiac medications before your next appointment, please call your pharmacy*  Lab Work:  BMET TODAY   If you have labs (blood work) drawn today and your tests are completely normal, you will receive your results only by: Marland Kitchen MyChart Message (if you have MyChart) OR . A paper copy in the mail If you have any lab test that is abnormal or we need to change your treatment, we will call you to review the results.  Testing/Procedures: NONE ORDERED  TODAY    Follow-Up: At [facility], you and your health needs are our priority.  As part of our continuing mission to provide you with exceptional heart care, we have created designated Provider Care Teams.  These Care Teams include your primary Cardiologist (physician) and Advanced Practice Providers (APPs -  Physician Assistants and Nurse Practitioners) who all work together to provide you with the care you need, when you need it.  Your next appointment:   1 year(s)  The format for your next appointment:   In Person  Provider:   You may see Dr. Caryl Comes  or one of the following Advanced Practice Providers on your designated Care Team:    Chanetta Marshall, NP  Tommye Standard, PA-C  Legrand Como "Oda Kilts, [place]   Other Instructions

## 2019-08-07 ENCOUNTER — Other Ambulatory Visit: Payer: Self-pay

## 2019-08-07 ENCOUNTER — Encounter: Payer: Self-pay | Admitting: Family Medicine

## 2019-08-07 ENCOUNTER — Ambulatory Visit: Payer: BC Managed Care – PPO | Admitting: Family Medicine

## 2019-08-07 VITALS — BP 142/80 | HR 80 | Temp 96.5°F | Ht 71.0 in | Wt 388.0 lb

## 2019-08-07 DIAGNOSIS — E1140 Type 2 diabetes mellitus with diabetic neuropathy, unspecified: Secondary | ICD-10-CM | POA: Diagnosis not present

## 2019-08-07 DIAGNOSIS — N478 Other disorders of prepuce: Secondary | ICD-10-CM | POA: Diagnosis not present

## 2019-08-07 LAB — COMPREHENSIVE METABOLIC PANEL
ALT: 34 U/L (ref 0–53)
AST: 22 U/L (ref 0–37)
Albumin: 3.7 g/dL (ref 3.5–5.2)
Alkaline Phosphatase: 98 U/L (ref 39–117)
BUN: 14 mg/dL (ref 6–23)
CO2: 28 mEq/L (ref 19–32)
Calcium: 9.3 mg/dL (ref 8.4–10.5)
Chloride: 99 mEq/L (ref 96–112)
Creatinine, Ser: 0.9 mg/dL (ref 0.40–1.50)
GFR: 85.48 mL/min (ref >60.00)
Glucose, Bld: 294 mg/dL — ABNORMAL HIGH (ref 70–99)
Potassium: 4.4 mEq/L (ref 3.5–5.1)
Sodium: 133 mEq/L — ABNORMAL LOW (ref 135–145)
Total Bilirubin: 0.7 mg/dL (ref 0.2–1.2)
Total Protein: 6.5 g/dL (ref 6.0–8.3)

## 2019-08-07 LAB — LIPID PANEL
Cholesterol: 163 mg/dL (ref 0–200)
HDL: 32.6 mg/dL — ABNORMAL LOW (ref >39.00)
NonHDL: 130.39
Total CHOL/HDL Ratio: 5
Triglycerides: 384 mg/dL — ABNORMAL HIGH (ref 0.0–149.0)
VLDL: 76.8 mg/dL — ABNORMAL HIGH (ref 0.0–40.0)

## 2019-08-07 LAB — CBC WITH DIFFERENTIAL/PLATELET
Basophils Absolute: 0.1 10*3/uL (ref 0.0–0.1)
Basophils Relative: 0.8 % (ref 0.0–3.0)
Eosinophils Absolute: 0.4 10*3/uL (ref 0.0–0.7)
Eosinophils Relative: 5.5 % — ABNORMAL HIGH (ref 0.0–5.0)
HCT: 42.3 % (ref 39.0–52.0)
Hemoglobin: 14.4 g/dL (ref 13.0–17.0)
Lymphocytes Relative: 20.6 % (ref 12.0–46.0)
Lymphs Abs: 1.4 10*3/uL (ref 0.7–4.0)
MCHC: 34.1 g/dL (ref 30.0–36.0)
MCV: 89.1 fl (ref 78.0–100.0)
Monocytes Absolute: 0.5 10*3/uL (ref 0.1–1.0)
Monocytes Relative: 7 % (ref 3.0–12.0)
Neutro Abs: 4.6 10*3/uL (ref 1.4–7.7)
Neutrophils Relative %: 66.1 % (ref 43.0–77.0)
Platelets: 225 10*3/uL (ref 150.0–400.0)
RBC: 4.75 Mil/uL (ref 4.22–5.81)
RDW: 14.3 % (ref 11.5–15.5)
WBC: 7 10*3/uL (ref 4.0–10.5)

## 2019-08-07 LAB — HEMOGLOBIN A1C: Hgb A1c MFr Bld: 10.8 % — ABNORMAL HIGH (ref 4.6–6.5)

## 2019-08-07 LAB — LDL CHOLESTEROL, DIRECT: Direct LDL: 80 mg/dL

## 2019-08-07 MED ORDER — FLUCONAZOLE 150 MG PO TABS: 150.0000 mg | ORAL_TABLET | Freq: Once | ORAL | 0 refills | Status: DC

## 2019-08-07 NOTE — Progress Notes (Signed)
This visit occurred during the SARS-CoV-2 public health emergency.  Safety protocols were in place, including screening questions prior to the visit, additional usage of staff PPE, and extensive cleaning of exam room while observing appropriate contact time as indicated for disinfecting solutions.  He had covid vaccine schedule for the near future.  D/w pt.   He had been driving to [place], back and forth, prolonged sitting.  Now working in [place]. He knees were aching more in the meantime.    He has some irritation on the foreskin.  No burning with urination.  Sugars has been up to 200s. D/w pt.  No help with neosporin.  No fevers.  No burning with urination.  Can still retract foreskin.    Meds, vitals, and allergies reviewed.   ROS: Per HPI unless specifically indicated in ROS section   nad ncat rrr ctab 1+ BLE edema.  Likely fungal irritation on the foreskin.  No discharge.  Foreskin is mobile.

## 2019-08-07 NOTE — Patient Instructions (Addendum)
Schedule a physical.  Go to the lab on the way out.  Take one dose of diflucan today and then repeat in 1 week if needed.  Take care.  Glad to see you. If you can't retract the foreskin, then call the triage line/seek eval.

## 2019-08-10 DIAGNOSIS — N478 Other disorders of prepuce: Secondary | ICD-10-CM | POA: Insufficient documentation

## 2019-08-10 NOTE — Assessment & Plan Note (Signed)
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  No emergent findings.  He can use fluconazole.  Routine cautions given to patient.  Update me if not improved.  Needs improved diabetic control.  See notes on labs.

## 2019-08-24 ENCOUNTER — Other Ambulatory Visit: Payer: Self-pay | Admitting: Family Medicine

## 2019-08-25 ENCOUNTER — Other Ambulatory Visit: Payer: Self-pay

## 2019-08-25 ENCOUNTER — Ambulatory Visit (INDEPENDENT_AMBULATORY_CARE_PROVIDER_SITE_OTHER): Payer: BC Managed Care – PPO | Admitting: Family Medicine

## 2019-08-25 DIAGNOSIS — E1140 Type 2 diabetes mellitus with diabetic neuropathy, unspecified: Secondary | ICD-10-CM | POA: Diagnosis not present

## 2019-08-25 DIAGNOSIS — Z6841 Body Mass Index (BMI) 40.0 and over, adult: Secondary | ICD-10-CM

## 2019-08-25 DIAGNOSIS — G4733 Obstructive sleep apnea (adult) (pediatric): Secondary | ICD-10-CM

## 2019-08-25 NOTE — Progress Notes (Signed)
Interactive audio and video telecommunications were attempted between this provider and patient, however failed, due to patient having technical difficulties OR patient did not have access to video capability.  We continued and completed visit with audio only.   Virtual Visit via Telephone Note  I connected with patient on 08/25/19  at 4:20 PM  by telephone and verified that I am speaking with the correct person using two identifiers.  Location of patient: home  Location of MD: [place] Name of referring provider (if blank then none associated): Names per persons and role in encounter:  MD: Earlyne Iba, Patient: name listed above.    I discussed the limitations, risks, security and privacy concerns of performing an evaluation and management service by telephone and the availability of in person appointments. I also discussed with the patient that there may be a patient responsible charge related to this service. The patient expressed understanding and agreed to proceed.  CC: DM.    History of Present Illness:  He has fatigue and knee pain.  He thought the pain was weight related/exacerbated.  We talked about his diet.  We talked about him calling for eval at [facility] for gastric bypass.  We talked about his A1c and diabetes situation in general.  See plan.  We talked about recent labs  Observations/Objective: nad Speech normal.  Assessment and Plan: DM2.  He'll price check basaglar, levemir, lantus.  He will update me and we will see about getting that started.  He can call about follow-up with the general surgery clinic regarding gastric bypass.   I asked him to keep working on diet and exercise as best he can in the meantime.    Follow Up Instructions: See above.  I discussed the assessment and treatment plan with the patient. The patient was provided an opportunity to ask questions and all were answered. The patient agreed with the plan and demonstrated an understanding of the  instructions.   The patient was advised to call back or seek an in-person evaluation if the symptoms worsen or if the condition fails to improve as anticipated.  I provided 15 minutes of non-face-to-face time during this encounter.  Elsie Stain, MD

## 2019-08-25 NOTE — Telephone Encounter (Signed)
Electronic refill request. Torsemide  Last office visit:   08/25/2019 Last Filled:    30 tablet 0 07/15/2019  Patient is apparently taking this regularly rather than as needed.  Is that ok? Please advise.

## 2019-08-26 ENCOUNTER — Encounter: Payer: Self-pay | Admitting: Family Medicine

## 2019-08-26 NOTE — Telephone Encounter (Signed)
Okay to continue.  rx sent.  Thanks.  

## 2019-08-27 ENCOUNTER — Encounter: Payer: Self-pay | Admitting: Family Medicine

## 2019-08-27 NOTE — Assessment & Plan Note (Addendum)
He'll price check basaglar, levemir, lantus.  He will update me and we will see about getting that started.  He can call about follow-up with the general surgery clinic regarding gastric bypass.  Referral placed.  I asked him to keep working on diet and exercise as best he can in the meantime.

## 2019-08-28 ENCOUNTER — Other Ambulatory Visit: Payer: Self-pay | Admitting: Family Medicine

## 2019-08-28 MED ORDER — PEN NEEDLES 30G X 5 MM MISC: 1.0000 | Freq: Every day | 3 refills | Status: DC

## 2019-08-28 MED ORDER — BASAGLAR KWIKPEN 100 UNIT/ML ~~LOC~~ SOPN: 5.0000 [IU] | PEN_INJECTOR | Freq: Every day | SUBCUTANEOUS | 12 refills | Status: DC

## 2019-08-28 NOTE — Telephone Encounter (Signed)
Electronic refill request. Cephalexin Last office visit:   08/25/2019 Last Filled:   #21  On 05/04/2019 Please advise.

## 2019-08-29 NOTE — Telephone Encounter (Signed)
Sent. Thanks.  Note sent to patient.

## 2019-09-01 ENCOUNTER — Ambulatory Visit: Payer: BC Managed Care – PPO | Admitting: Family Medicine

## 2019-09-01 ENCOUNTER — Encounter: Payer: Self-pay | Admitting: Family Medicine

## 2019-09-01 ENCOUNTER — Other Ambulatory Visit: Payer: Self-pay

## 2019-09-01 DIAGNOSIS — E1140 Type 2 diabetes mellitus with diabetic neuropathy, unspecified: Secondary | ICD-10-CM | POA: Diagnosis not present

## 2019-09-01 MED ORDER — CITALOPRAM HYDROBROMIDE 20 MG PO TABS: 20.0000 mg | ORAL_TABLET | Freq: Every day | ORAL | 3 refills | Status: DC

## 2019-09-01 NOTE — Patient Instructions (Addendum)
Check AM sugar.  If >150, then add 1 unit per day.  If <100, then cut back 1 unit.  If 100-150, then no change.    Let me know about your sugars in about 10 days, sooner if needed.  Take care.  Glad to see you. Recheck A1c at a visit in about 3 months.

## 2019-09-01 NOTE — Progress Notes (Signed)
This visit occurred during the SARS-CoV-2 public health emergency.  Safety protocols were in place, including screening questions prior to the visit, additional usage of staff PPE, and extensive cleaning of exam room while observing appropriate contact time as indicated for disinfecting solutions.  DM2 insulin instruction d/w pt.   He didn't need keflex refill, that was an unneeded auto refill request.    He needed refill on citalopram.  Done at [facility].   See plan.   Meds, vitals, and allergies reviewed.   ROS: Per HPI unless specifically indicated in ROS section   nad ncat

## 2019-09-03 NOTE — Assessment & Plan Note (Signed)
Instructed patient re: aseptic insulin pen use with proper technique, needle disposal.  Instructions printed for patient.  He appropriately injected 5 units with pen.  He'll inc daily based on AM insulin, see AVS.  He'll update me as needed.  He is considering bariatric intervention d/w pt.  At least 20 minutes were devoted to patient care in this encounter (this can potentially include time spent reviewing the patient's file/history, interviewing and examining the patient, counseling/reviewing plan with patient, ordering referrals, ordering tests, reviewing relevant laboratory or x-ray data, and documenting the encounter).

## 2019-09-17 ENCOUNTER — Other Ambulatory Visit: Payer: Self-pay

## 2019-09-17 ENCOUNTER — Ambulatory Visit: Payer: BC Managed Care – PPO | Admitting: Family Medicine

## 2019-09-17 ENCOUNTER — Encounter: Payer: Self-pay | Admitting: Family Medicine

## 2019-09-17 ENCOUNTER — Ambulatory Visit: Payer: Self-pay

## 2019-09-17 VITALS — BP 137/80 | HR 78 | Ht 71.0 in | Wt 382.0 lb

## 2019-09-17 DIAGNOSIS — Z6841 Body Mass Index (BMI) 40.0 and over, adult: Secondary | ICD-10-CM | POA: Diagnosis not present

## 2019-09-17 DIAGNOSIS — M170 Bilateral primary osteoarthritis of knee: Secondary | ICD-10-CM | POA: Diagnosis not present

## 2019-09-17 MED ORDER — TRIAMCINOLONE ACETONIDE 40 MG/ML IJ SUSP
40.0000 mg | Freq: Once | INTRAMUSCULAR | Status: AC
  Administered 2019-09-17: 40 mg via INTRA_ARTICULAR

## 2019-09-17 MED ORDER — PENNSAID 2 % EX SOLN: 1.0000 "application " | Freq: Two times a day (BID) | CUTANEOUS | 3 refills | Status: DC

## 2019-09-17 NOTE — Assessment & Plan Note (Signed)
He has seen surgery and weight loss clinic before.  Discussed different medications like Saxenda versus bariatric surgery.  Getting his BMI lower is going to be his best bet for possible knee arthroplasty.

## 2019-09-17 NOTE — Assessment & Plan Note (Signed)
Acute on chronic in nature.  We discussed potential for replacement but his best option will be trying to get it is BMI to less than 40 for less risk of complications. -Injections today. -Pennsaid and samples provided. -Counseled on home exercise therapy and supportive care. -Could consider Toradol or saline injections.

## 2019-09-17 NOTE — Progress Notes (Signed)
Medication Samples have been provided to the patient.  Drug name: Pennsaid       Strength: 2%        Qty: 2 Boxes  [id number]  Exp.Date: 03/2020  Dosing instructions: Use a pea size amount and rub gently.  The patient has been instructed regarding the correct time, dose, and frequency of taking this medication, including desired effects and most common side effects.   Sherrie George, MA 1:14 PM 09/17/2019

## 2019-09-17 NOTE — Patient Instructions (Signed)
Good to see you Please try ice as needed  Please try the exercises  Please try the pennsaid   Please send me a message in MyChart with any questions or updates.  Please see me back in 4-6 weeks .   --Dr. Raeford Razor

## 2019-09-17 NOTE — Progress Notes (Signed)
Glenn Lyons - 64 y.o. male MRN [id number]  Date of birth: [birth year]  SUBJECTIVE:  Including CC & ROS.  Chief Complaint  Patient presents with  . Knee Pain    bilateral knee    Glenn Lyons is a 64 y.o. male that is presenting with acute on chronic bilateral knee pain.  His pain is gotten worse over the past few months.  He has received steroid injections in the past.  Denies any inciting event or trauma.  It is worse with ambulation.  Localized to the knee.   Review of Systems See HPI   HISTORY: Past Medical, Surgical, Social, and Family History Reviewed & Updated per EMR.   Pertinent Historical Findings include:  Past Medical History:  Diagnosis Date  . Achalasia    with prev eval at [facility].  No intervention as of 2012  . Aspiration pneumonia ([place]) 10/05/2014  . Backache, unspecified   . Cardiac pacemaker St. Jude    ERI 2008  . Depressive disorder, not elsewhere classified   . Diabetes ([place])   . Esophageal reflux   . Morbid obesity ([place])   . Obstructive sleep apnea    biapap settign 25-22  . Osteoarthrosis, unspecified whether generalized or localized, unspecified site   . Osteoarthrosis, unspecified whether generalized or localized, unspecified site   . Presence of permanent cardiac pacemaker    St. Jude- Dr. Caryl Comes follows -device Check 07-06-14  . Sinus bradycardia /pauses   . Stricture and stenosis of esophagus     Past Surgical History:  Procedure Laterality Date  . BALLOON DILATION N/A 10/06/2015   Procedure: BALLOON DILATION;  Surgeon: Mauri Pole, MD;  Location: [place] [facility];  Service: Endoscopy;  Laterality: N/A;  . BIOPSY  05/06/2018   Procedure: BIOPSY;  Surgeon: Rush Landmark Telford Nab., MD;  Location: [place];  Service: Gastroenterology;;  . COLONOSCOPY W/ POLYPECTOMY  11/01/2010  . COLONOSCOPY WITH PROPOFOL N/A 10/05/2014   Procedure: COLONOSCOPY WITH PROPOFOL;  Surgeon: Gatha Mayer, MD;  [facility]: [facility];  Service: Endoscopy;  Laterality: N/A;    . COLONOSCOPY WITH PROPOFOL N/A 05/06/2018   Procedure: COLONOSCOPY WITH PROPOFOL;  Surgeon: Rush Landmark Telford Nab., MD;  Location: [place];  Service: Gastroenterology;  Laterality: N/A;  . ESOPHAGEAL DILATION    . ESOPHAGEAL MANOMETRY N/A 08/23/2015   Procedure: ESOPHAGEAL MANOMETRY (EM);  Surgeon: Mauri Pole, MD;  [facility];  Service: Endoscopy;  Laterality: N/A;  . ESOPHAGOGASTRODUODENOSCOPY N/A 10/09/2014   Procedure: ESOPHAGOGASTRODUODENOSCOPY (EGD);  Surgeon: Gatha Mayer, MD;  Location: [facility];  Service: Endoscopy;  Laterality: N/A;  . ESOPHAGOGASTRODUODENOSCOPY (EGD) WITH PROPOFOL N/A 08/23/2015   Procedure: ESOPHAGOGASTRODUODENOSCOPY (EGD) WITH PROPOFOL;  Surgeon: Mauri Pole, MD;  [facility];  Service: Endoscopy;  Laterality: N/A;  . ESOPHAGOGASTRODUODENOSCOPY (EGD) WITH PROPOFOL N/A 10/06/2015   Procedure: ESOPHAGOGASTRODUODENOSCOPY (EGD) WITH PROPOFOL;  Surgeon: Mauri Pole, MD;  Location: [place] [facility];  Service: Endoscopy;  Laterality: N/A;  Rigiflex ballon size 70m-35mm size 45 minute proc,need Fluro Gastografin esophagram 2 hrs post EGD   . PACEMAKER INSERTION    . POLYPECTOMY  05/06/2018   Procedure: POLYPECTOMY;  Surgeon: Mansouraty, GTelford Nab, MD;  [place] [facility]  Service: Gastroenterology;;  . PBetsy PriesGENERATOR CHANGEOUT N/A 05/01/2018   Procedure: PPM GENERATOR CHANGEOUT;  Surgeon: KDeboraha Sprang MD;  [facility];  Service: Cardiovascular;  Laterality: N/A;    Family History  Problem Relation Age of Onset  . Heart attack Father   .  Heart disease Father   . Cancer Father        multiple myeloma  . Hypertension Father   . Prostate cancer Father        possible dx  . Hypertension Mother   . Dementia Mother   . Colon cancer Neg Hx     Social History   Socioeconomic History  . Marital status: Married    Spouse name: Not on file  . Number of children: 2  . Years of education: Not  on file  . Highest education level: Not on file  Occupational History  . 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 Occupation: singer    Employer: [employer]  Tobacco Use  . Smoking status: Former Smoker    Packs/day: 1.00    Years: 20.00    Pack years: 20.00    Types: Cigarettes    Quit date: 05/29/2004    Years since quitting: 15.3  . Smokeless tobacco: Never Used  Substance and Sexual Activity  . Alcohol use: Yes    Comment: rarely  . Drug use: No  . Sexual activity: Yes  Other Topics Concern  . Not on file  Social History Narrative   From Air cabin crew   Married   Social Determinants of Health   Financial Resource Strain:   . Difficulty of Paying Living Expenses:   Food Insecurity:   . Worried About Charity fundraiser in the Last Year:   . Arboriculturist in the Last Year:   Transportation Needs:   . Film/video editor (Medical):   Marland Kitchen Lack of Transportation (Non-Medical):   Physical Activity:   . Days of Exercise per Week:   . Minutes of Exercise per Session:   Stress:   . Feeling of Stress :   Social Connections:   . Frequency of Communication with Friends and Family:   . Frequency of Social Gatherings with Friends and Family:   . Attends Religious Services:   . Active Member of Clubs or Organizations:   . Attends Archivist Meetings:   Marland Kitchen Marital Status:   Intimate Partner Violence:   . Fear of Current or Ex-Partner:   . Emotionally Abused:   Marland Kitchen Physically Abused:   . Sexually Abused:      PHYSICAL EXAM:  VS: BP 137/80   Pulse 78   Ht '5\' 11"'  (1.803 m)   Wt (!) 382 lb (173.3 kg)   BMI 53.28 kg/m  Physical Exam Gen: NAD, alert, cooperative with exam, well-appearing MSK:  Right and left knee: No obvious effusion. Instability with valgus and varus stress testing. Normal strength resistance. Neurovascularly intact   Aspiration/Injection Procedure Note Glenn Lyons [birth year]  Procedure: Injection Indications: right knee  pain  Procedure Details Consent: Risks of procedure as well as the alternatives and risks of each were explained to the (patient/caregiver).  Consent for procedure obtained. Time Out: Verified patient identification, verified procedure, site/side was marked, verified correct patient position, special equipment/implants available, medications/allergies/relevent history reviewed, required imaging and test results available.  Performed.  The area was cleaned with iodine and alcohol swabs.    The right knee superior lateral suprapatellar pouch was injected using 1 cc's of 40 mg Kenalog and 4 cc's of 0.25% bupivacaine with a 21 2" needle.  Ultrasound was used. Images were obtained in long views showing the injection.     A sterile dressing was applied.  Patient did tolerate procedure well.  Aspiration/Injection Procedure Note Glenn  Lyons 1955-12-06  Procedure: Injection Indications: left knee pain  Procedure Details Consent: Risks of procedure as well as the alternatives and risks of each were explained to the (patient/caregiver).  Consent for procedure obtained. Time Out: Verified patient identification, verified procedure, site/side was marked, verified correct patient position, special equipment/implants available, medications/allergies/relevent history reviewed, required imaging and test results available.  Performed.  The area was cleaned with iodine and alcohol swabs.    The left knee superior lateral suprapatellar pouch was injected using 1 cc's of 40 mg Kenalog and 4 cc's of 0.25% bupivacaine with a 21 2" needle.  Ultrasound was used. Images were obtained in long views showing the injection.     A sterile dressing was applied.  Patient did tolerate procedure well.   ASSESSMENT & PLAN:   OA (osteoarthritis) of knee Acute on chronic in nature.  We discussed potential for replacement but his best option will be trying to get it is BMI to less than 40 for less risk of  complications. -Injections today. -Pennsaid and samples provided. -Counseled on home exercise therapy and supportive care. -Could consider Toradol or saline injections.  Morbid obesity with BMI of 50.0-59.9, adult He has seen surgery and weight loss clinic before.  Discussed different medications like Saxenda versus bariatric surgery.  Getting his BMI lower is going to be his best bet for possible knee arthroplasty.

## 2019-09-18 ENCOUNTER — Encounter: Payer: Self-pay | Admitting: Family Medicine

## 2019-09-19 ENCOUNTER — Other Ambulatory Visit: Payer: Self-pay | Admitting: Family Medicine

## 2019-09-22 ENCOUNTER — Telehealth: Payer: Self-pay | Admitting: Behavioral Health

## 2019-09-22 ENCOUNTER — Other Ambulatory Visit: Payer: Self-pay

## 2019-09-22 DIAGNOSIS — Z23 Encounter for immunization: Secondary | ICD-10-CM

## 2019-09-22 NOTE — Telephone Encounter (Signed)
I thank all involved. 

## 2019-09-22 NOTE — Telephone Encounter (Signed)
Patient has requested shingles vaccine & scheduled nurse visit for 4/27 at [facility]. Please advise. Thanks.

## 2019-09-22 NOTE — Telephone Encounter (Signed)
Ok to do. Will need 2nd shingrix vaccine in 2-6 months from first one.

## 2019-09-22 NOTE — Telephone Encounter (Signed)
Pt wants to go to [place] for shingrex vaccine but they will need an order from PCP. Sending msg to Dr. Danise Mina because PCP is out the rest of the week.

## 2019-09-23 ENCOUNTER — Other Ambulatory Visit: Payer: Self-pay | Admitting: Family Medicine

## 2019-09-23 ENCOUNTER — Ambulatory Visit: Payer: BC Managed Care – PPO | Admitting: Nurse Practitioner

## 2019-09-23 ENCOUNTER — Ambulatory Visit (INDEPENDENT_AMBULATORY_CARE_PROVIDER_SITE_OTHER): Payer: BC Managed Care – PPO

## 2019-09-23 DIAGNOSIS — Z23 Encounter for immunization: Secondary | ICD-10-CM | POA: Diagnosis not present

## 2019-09-23 NOTE — Progress Notes (Signed)
Pt came into the office to get 1st shingle vaccine. Gave injection into the left arm, pt tolerated injection well, gave information sheet. Pt stated he will call back in 2-6 months to schedule the 2nd one.

## 2019-09-23 NOTE — Telephone Encounter (Signed)
Electronic refill request. Fluconazole Last office visit:   09/01/2019 Last Filled:   08/07/2019 Please advise.

## 2019-09-24 ENCOUNTER — Encounter: Payer: Self-pay | Admitting: Family Medicine

## 2019-09-25 ENCOUNTER — Encounter: Payer: Self-pay | Admitting: Family Medicine

## 2019-09-25 NOTE — Telephone Encounter (Signed)
Patient requested this refill earlier in the week.

## 2019-09-25 NOTE — Telephone Encounter (Signed)
Sent. Thanks.   

## 2019-09-26 ENCOUNTER — Telehealth: Payer: Self-pay

## 2019-09-26 NOTE — Telephone Encounter (Signed)
   [facility] Pre-operative Risk Assessment    Request for surgical clearance:  1. What type of surgery is being performed? Bariatric     2. When is this surgery scheduled? Late July 2021- no specific date set    3. What type of clearance is required (medical clearance vs. Pharmacy clearance to hold med vs. Both)? Medical  4. Are there any medications that need to be held prior to surgery and how long?  None listed  5. Practice name and name of physician performing surgery? [facility] -- Greer Pickerel, MD    6. What is your office phone number [phone]    7.   What is your office fax number [phone]  8.   Anesthesia type (None, local, MAC, general) ? General

## 2019-09-30 ENCOUNTER — Ambulatory Visit: Payer: BC Managed Care – PPO | Admitting: Family Medicine

## 2019-09-30 ENCOUNTER — Ambulatory Visit: Payer: BC Managed Care – PPO

## 2019-10-01 NOTE — Telephone Encounter (Signed)
This patient has no history of CAD or prior ischemic work up.  He is followed by Dr Caryl Comes for his pacemaker. His last functional study was an echo in 2018.  He has NIDDM, remote smoking, and a FM Hx of CAD. He should be seen by a general cardiologist for pre op clearance.  Kerin Ransom PA-C 10/01/2019 9:48 AM

## 2019-10-01 NOTE — Telephone Encounter (Signed)
   Primary Cardiologist: Dr Caryl Comes  Chart reviewed as part of pre-operative protocol coverage. Because of Glenn Lyons's past medical history and time since last visit, he/she will require a follow-up visit in order to better assess preoperative cardiovascular risk.  Dr Caryl Comes follows him for his pacemaker.   Pre-op covering staff: - Please schedule appointment with any APP at [facility] in the next few weeks and call patient to inform them. - Please contact requesting surgeon's office via preferred method (i.e, phone, fax) to inform them of need for appointment prior to surgery.  If applicable, this message will also be routed to pharmacy pool and/or primary cardiologist for input on holding anticoagulant/antiplatelet agent as requested below so that this information is available at time of patient's appointment.   Kerin Ransom, PA-C  10/01/2019, 9:44 AM

## 2019-10-03 NOTE — Telephone Encounter (Signed)
Attempted to contact patient to set up appt with [facility]. Left vm to call back to set up appt

## 2019-10-06 NOTE — Telephone Encounter (Signed)
Patient is scheduled to see Dr. Percival Spanish on 10/16/19

## 2019-10-10 ENCOUNTER — Telehealth: Payer: Self-pay

## 2019-10-10 NOTE — Telephone Encounter (Signed)
Spoke with patient to remind of missed remote transmission 

## 2019-10-15 ENCOUNTER — Encounter: Payer: Self-pay | Admitting: Cardiology

## 2019-10-15 NOTE — Progress Notes (Signed)
Cardiology Office Note   Date:  10/16/2019   ID:  Glenn Lyons, DOB [birth year], MRN [id number]  PCP:  Tonia Ghent, MD  Cardiologist:   No primary care provider on file.   Chief Complaint  Patient presents with  . Shortness of Breath      History of Present Illness: Glenn Lyons is a 64 y.o. male who presents for preop evaluation prior to bariatric surgery.  He is referred by Tonia Ghent, MD.  He has a history of sinus node dysfunction with a PPM.  He has had no other cardiac history.  He is going to have bariatric surgery.  He is very limited in his activities because of his morbid obesity.  He is unable to walk more than a few feet on level ground without shortness of breath.  He has minimal activities.  He is limited by knee pain.  He will get short of breath with activities but denies any resting shortness of breath, organomegaly or orthopnea.  There is no palpitations, presyncope or syncope.  He does have some chronic lower extremity swelling.  He is not describing PND or orthopnea however.  He has had stress test he says years ago but otherwise no cardiac history other than the pacemaker.   Past Medical History:  Diagnosis Date  . Achalasia    with prev eval at [facility].  No intervention as of 2012  . Aspiration pneumonia ([place]) 10/05/2014  . Backache, unspecified   . Cardiac pacemaker St. Jude    ERI 2008  . Depressive disorder, not elsewhere classified   . Diabetes ([place])   . Esophageal reflux   . Morbid obesity ([place])   . Obstructive sleep apnea    biapap settign 25-22  . Presence of permanent cardiac pacemaker    St. Jude- Dr. Caryl Comes follows -device Check 07-06-14  . Sinus bradycardia /pauses   . Stricture and stenosis of esophagus     Past Surgical History:  Procedure Laterality Date  . BALLOON DILATION N/A 10/06/2015   Procedure: BALLOON DILATION;  Surgeon: Mauri Pole, MD;  Location: [place] [facility]COPY;  Service: Endoscopy;  Laterality: N/A;  . BIOPSY   05/06/2018   Procedure: BIOPSY;  Surgeon: Rush Landmark Telford Nab., MD;  Location: [place];  Service: Gastroenterology;;  . COLONOSCOPY W/ POLYPECTOMY  11/01/2010  . COLONOSCOPY WITH PROPOFOL N/A 10/05/2014   Procedure: COLONOSCOPY WITH PROPOFOL;  Surgeon: Gatha Mayer, MD;  [facility];  Service: Endoscopy;  Laterality: N/A;  . COLONOSCOPY WITH PROPOFOL N/A 05/06/2018   Procedure: COLONOSCOPY WITH PROPOFOL;  Surgeon: Rush Landmark Telford Nab., MD;  Location: [place];  Service: Gastroenterology;  Laterality: N/A;  . ESOPHAGEAL DILATION    . ESOPHAGEAL MANOMETRY N/A 08/23/2015   Procedure: ESOPHAGEAL MANOMETRY (EM);  Surgeon: Mauri Pole, MD;  [facility];  Service: Endoscopy;  Laterality: N/A;  . ESOPHAGOGASTRODUODENOSCOPY N/A 10/09/2014   Procedure: ESOPHAGOGASTRODUODENOSCOPY (EGD);  Surgeon: Gatha Mayer, MD;  Location: [facility];  Service: Endoscopy;  Laterality: N/A;  . ESOPHAGOGASTRODUODENOSCOPY (EGD) WITH PROPOFOL N/A 08/23/2015   Procedure: ESOPHAGOGASTRODUODENOSCOPY (EGD) WITH PROPOFOL;  Surgeon: Mauri Pole, MD;  [facility];  Service: Endoscopy;  Laterality: N/A;  . ESOPHAGOGASTRODUODENOSCOPY (EGD) WITH PROPOFOL N/A 10/06/2015   Procedure: ESOPHAGOGASTRODUODENOSCOPY (EGD) WITH PROPOFOL;  Surgeon: Mauri Pole, MD;  Location: [place] [facility];  Service: Endoscopy;  Laterality: N/A;  Rigiflex ballon size 73m-35mm size 45 minute proc,need Fluro Gastografin esophagram 2 hrs post EGD   . PACEMAKER  INSERTION    . POLYPECTOMY  05/06/2018   Procedure: POLYPECTOMY;  Surgeon: Mansouraty, Telford Nab., MD;  Location: [place];  Service: Gastroenterology;;  . Betsy Pries GENERATOR CHANGEOUT N/A 05/01/2018   Procedure: PPM GENERATOR CHANGEOUT;  Surgeon: Deboraha Sprang, MD;  Location: [place] [facility];  Service: Cardiovascular;  Laterality: N/A;     Current Outpatient Medications  Medication Sig Dispense Refill  . aspirin 81 MG tablet Take 81  mg by mouth daily.      Marland Kitchen atorvastatin (LIPITOR) 10 MG tablet TAKE 1 TABLET BY MOUTH EVERY DAY 90 tablet 3  . Blood Glucose Monitoring Suppl (ONETOUCH VERIO) w/Device KIT Inject 1 kit into the skin every morning. Use to test blood sugar each day before breakfast and 2 hours after a meal for 2 weeks.  Diagnosis:  E11.9  Non-insulin dependent. 1 kit 0  . cetirizine (ZYRTEC) 10 MG tablet Take 10 mg by mouth daily.     . citalopram (CELEXA) 20 MG tablet Take 1 tablet (20 mg total) by mouth daily. 90 tablet 3  . Diclofenac Sodium (PENNSAID) 2 % SOLN Place 1 application onto the skin 2 (two) times daily. 112 g 3  . Emollient (CERAVE) CREA Apply 1 application topically 2 (two) times daily.  0  . glipiZIDE (GLUCOTROL) 5 MG tablet Take 1-2 tablets (5-10 mg total) by mouth daily before breakfast. 180 tablet 1  . ibuprofen (ADVIL,MOTRIN) 200 MG tablet Take 400-800 mg by mouth every 6 (six) hours as needed for headache or moderate pain.    . Insulin Glargine (BASAGLAR KWIKPEN) 100 UNIT/ML Inject 0.05-0.2 mLs (5-20 Units total) into the skin daily. 5 pen 12  . Insulin Pen Needle (PEN NEEDLES) 30G X 5 MM MISC 1 Device by Does not apply route daily. Use daily with insulin pen injection. 100 each 3  . Lancets (ONETOUCH ULTRASOFT) lancets USE TO TEST BLOOD SUGAR ONCE DAILY OR AS NEEDED 100 each 3  . Multiple Vitamin (MULTIVITAMIN WITH MINERALS) TABS tablet Take 1 tablet by mouth daily.    Glory Rosebush VERIO test strip USE TO TEST BLOOD SUGAR ONCE DAILY OR AS INSTRUCTED 100 strip 3  . torsemide (DEMADEX) 20 MG tablet TAKE 1 TABLET (20 MG TOTAL) BY MOUTH DAILY AS NEEDED (FOR SWELLING). 30 tablet 2   No current facility-administered medications for this visit.    Allergies:   Morphine and Metformin and related    ROS:  Please see the history of present illness.   Otherwise, review of systems are positive for none.   All other systems are reviewed and negative.    PHYSICAL EXAM: VS:  BP (!) 146/74   Pulse 73    Temp (!) 97.4 F (36.3 C)   Resp 20   Ht '5\' 11"'  (1.803 m)   Wt (!) 401 lb 3.2 oz (182 kg)   SpO2 97%   BMI 55.96 kg/m  , BMI Body mass index is 55.96 kg/m. GENERAL:  Well appearing NECK:  No jugular venous distention, waveform within normal limits, carotid upstroke brisk and symmetric, no bruits, no thyromegaly LUNGS:  Clear to auscultation bilaterally CHEST: Well-healed pacemaker pocket HEART:  PMI not displaced or sustained,S1 and S2 within normal limits, no S3, no S4, no clicks, no rubs, no murmurs ABD:  Flat, positive bowel sounds normal in frequency in pitch, no bruits, no rebound, no guarding, no midline pulsatile mass, no hepatomegaly, no splenomegaly EXT:  2 plus pulses throughout, moderate edema, no cyanosis no clubbing  EKG:  EKG is ordered today. The ekg ordered today demonstrates sinus rhythm, rate 67, axis left, left anterior fascicular block, no acute ST-T wave changes.   Recent Labs: 08/07/2019: ALT 34; BUN 14; Creatinine, Ser 0.90; Hemoglobin 14.4; Platelets 225.0; Potassium 4.4; Sodium 133    Lipid Panel    Component Value Date/Time   CHOL 163 08/07/2019 0834   TRIG 384.0 (H) 08/07/2019 0834   HDL 32.60 (L) 08/07/2019 0834   CHOLHDL 5 08/07/2019 0834   VLDL 76.8 (H) 08/07/2019 0834   LDLDIRECT 80.0 08/07/2019 0834      Wt Readings from Last 3 Encounters:  10/16/19 (!) 401 lb 3.2 oz (182 kg)  10/16/19 (!) 399 lb 11.2 oz (181.3 kg)  09/17/19 (!) 382 lb (173.3 kg)      Other studies Reviewed: Additional studies/ records that were reviewed today include: Labs. Review of the above records demonstrates:  Please see elsewhere in the note.     ASSESSMENT AND PLAN:  PREOP: The patient has a poor functional level.  He does have some dyspnea with exertion with poorly treated diabetes.  Given this preoperative testing is indicated.  He would not be able walk on a treadmill and so will have a [employer].  PPM:  He is up to date with follow up.  DM:  A1c is 10.8 but he just started on insulin.  No change in therapy.  HTN: He says his blood pressure is usually well controlled.  No change in therapy.  COVID EDUCATION: He had his vaccine.  Current medicines are reviewed at length with the patient today.  The patient does not have concerns regarding medicines.  The following changes have been made:  no change  Labs/ tests ordered today include:   Orders Placed This Encounter  Procedures  . MYOCARDIAL PERFUSION IMAGING  . EKG 12-Lead     Disposition:   FU with me as needed.     Signed, Minus Breeding, MD  10/16/2019 12:09 PM    [facility]

## 2019-10-16 ENCOUNTER — Other Ambulatory Visit: Payer: Self-pay

## 2019-10-16 ENCOUNTER — Encounter: Payer: Self-pay | Admitting: Cardiology

## 2019-10-16 ENCOUNTER — Telehealth: Payer: Self-pay | Admitting: Cardiology

## 2019-10-16 ENCOUNTER — Ambulatory Visit: Payer: BC Managed Care – PPO | Admitting: Cardiology

## 2019-10-16 ENCOUNTER — Encounter: Payer: Self-pay | Admitting: Dietician

## 2019-10-16 ENCOUNTER — Encounter: Payer: BC Managed Care – PPO | Attending: General Surgery | Admitting: Dietician

## 2019-10-16 VITALS — BP 146/74 | HR 73 | Temp 97.4°F | Resp 20 | Ht 71.0 in | Wt >= 6400 oz

## 2019-10-16 DIAGNOSIS — Z01810 Encounter for preprocedural cardiovascular examination: Secondary | ICD-10-CM | POA: Diagnosis not present

## 2019-10-16 DIAGNOSIS — Z7189 Other specified counseling: Secondary | ICD-10-CM

## 2019-10-16 DIAGNOSIS — E669 Obesity, unspecified: Secondary | ICD-10-CM | POA: Diagnosis not present

## 2019-10-16 NOTE — Patient Instructions (Signed)
Medication Instructions:  NO CHANGES *If you need a refill on your cardiac medications before your next appointment, please call your pharmacy*  Lab Work: NONE ORDERED THIS VISIT  Testing/Procedures: Your physician has requested that you have a lexiscan myoview. For further information please visit [email or web address]. Please follow instruction sheet, as given.  Follow-Up: At [facility], you and your health needs are our priority.  As part of our continuing mission to provide you with exceptional heart care, we have created designated Provider Care Teams.  These Care Teams include your primary Cardiologist (physician) and Advanced Practice Providers (APPs -  Physician Assistants and Nurse Practitioners) who all work together to provide you with the care you need, when you need it.  Your next appointment:   FOLLOW UP AS NEEDED  Other Instructions Your physician has requested that you have a lexiscan myoview. For further information please visit [email or web address]. Please follow instruction sheet, as given. This will take place at [address], [address]  How to prepare for your Myocardial Perfusion Test:  Do not eat or drink 3 hours prior to your test, except you may have water.  Do not consume products containing caffeine (regular or decaffeinated) 12 hours prior to your test. (ex: coffee, chocolate, sodas, tea).  Do bring a list of your current medications with you.  If not listed below, you may take your medications as normal.  Do wear comfortable clothes (no dresses or overalls) and walking shoes, tennis shoes preferred (No heels or open toe shoes are allowed).  Do NOT wear cologne, perfume, aftershave, or lotions (deodorant is allowed).  The test will take approximately 3 to 4 hours to complete  If these instructions are not followed, your test will have to be rescheduled.   

## 2019-10-16 NOTE — Telephone Encounter (Signed)
Left message for patient to call so we can reschedule the Lexiscan myoview on 10/29/19 and 10/30/19 from [facility] to [employer]

## 2019-10-16 NOTE — Progress Notes (Signed)
Nutrition Assessment for Bariatric Surgery Medical Nutrition Therapy   Patient was seen on 10/16/2019 for Pre-Operative Nutrition Assessment. Letter of approval faxed to [facility] bariatric surgery program coordinator on 10/16/2019.   Referral stated Supervised Weight Loss (SWL) visits needed: 0  Planned surgery: RYGB Pt expectation of surgery: to have a better quality of life, relief knee pain, help control diabetes, be more active, be able to push-mow the yard   NUTRITION ASSESSMENT   Anthropometrics  Start weight at [facility]: 399.7 lbs (date: 10/16/2019) Height: 71 in BMI: 55.8 kg/m2     Lifestyle & Dietary Hx Typical meal pattern is 3 meals per day plus a snack/dessert in the evenings. Works long days at work, usually up to 10 hours per day. Mostly avoids fast food, and has  primarily eliminated white breads. He and his wife have [employer] meals delivered throughout the week. Patient's wife packs lunches and sometimes dinners as well. May have a sandwich on whole wheat bread, corn flakes cereal with bananas, tenderloin biscuit, burger with slaw, salmon, or chicken with lima beans. May snack on fruit or peanut butter crackers.   24-Hr Dietary Recall First Meal: scrambled eggs Snack: - Second Meal: salad  Snack: -  Third Meal: spaghetti  Snack: ice cream Beverages: water, Vitamin Water Zero  Estimated Energy Needs Calories: 1800-2000 Carbohydrate: 200-225g Protein: 113-125g Fat: 60-67g   NUTRITION DIAGNOSIS  Overweight/obesity ([place]-3.3) related to past poor dietary habits and physical inactivity as evidenced by patient w/ planned RYGB surgery following dietary guidelines for continued weight loss.    NUTRITION INTERVENTION  Nutrition counseling (C-1) and education (E-2) to facilitate bariatric surgery goals.  Pre-Op Goals Reviewed with the Patient . Track food and beverage intake (pen and paper, MyFitness Pal, Baritastic app, etc.) . Make healthy food choices  while monitoring portion sizes . Consume 3 meals per day or try to eat every 3-5 hours . Avoid concentrated sugars and fried foods . Keep sugar & fat in the single digits per serving on food labels . Practice CHEWING your food (aim for applesauce consistency) . Practice not drinking 15 minutes before, during, and 30 minutes after each meal and snack . Avoid all carbonated beverages (ex: soda, sparkling beverages)  . Limit caffeinated beverages (ex: coffee, tea, energy drinks) . Avoid all sugar-sweetened beverages (ex: regular soda, sports drinks)  . Avoid alcohol  . Aim for 64-100 ounces of FLUID daily (with at least half of fluid intake being plain water)  . Aim for at least 60-80 grams of PROTEIN daily . Look for a liquid protein source that contains ?15 g protein and ?5 g carbohydrate (ex: shakes, drinks, shots) . Make a list of non-food related activities . Physical activity is an important part of a healthy lifestyle so keep it moving! The goal is to reach 150 minutes of exercise per week, including cardiovascular and weight baring activity.  Handouts Provided Include  . Bariatric Surgery handouts (Nutrition Visits, Pre-Op Goals, Protein Shakes, Vitamins & Minerals)  Learning Style & Readiness for Change Teaching method utilized: Visual & Auditory  Demonstrated degree of understanding via: Teach Back  Barriers to learning/adherence to lifestyle change: None Identified    MONITORING & EVALUATION Dietary intake, weekly physical activity, body weight, and pre-op goals reached at next nutrition visit.   Next Steps Patient is to follow up at [facility] for Pre-Op Class (>2 weeks before surgery) for further nutrition education.

## 2019-10-16 NOTE — Telephone Encounter (Signed)
Spoke with patient to reschedule 10/29/19 and 10/30/19 myoview to our [facility] [employer] location---scheduled  11/12/19 and 11/13/19 at 1:00 pm---patient voiced his understanding and I will mail new information to him

## 2019-10-17 ENCOUNTER — Encounter: Payer: Self-pay | Admitting: Family Medicine

## 2019-10-19 ENCOUNTER — Encounter: Payer: Self-pay | Admitting: Family Medicine

## 2019-10-20 ENCOUNTER — Other Ambulatory Visit: Payer: Self-pay | Admitting: Family Medicine

## 2019-10-22 ENCOUNTER — Other Ambulatory Visit: Payer: Self-pay | Admitting: Family Medicine

## 2019-10-22 MED ORDER — CEPHALEXIN 500 MG PO CAPS: ORAL_CAPSULE | ORAL | 0 refills | Status: DC

## 2019-10-29 ENCOUNTER — Ambulatory Visit: Payer: BC Managed Care – PPO | Admitting: Family Medicine

## 2019-10-29 ENCOUNTER — Inpatient Hospital Stay (HOSPITAL_COMMUNITY): Admission: RE | Admit: 2019-10-29 | Payer: BC Managed Care – PPO | Source: Ambulatory Visit

## 2019-10-30 ENCOUNTER — Ambulatory Visit (HOSPITAL_COMMUNITY): Payer: BC Managed Care – PPO

## 2019-11-10 ENCOUNTER — Telehealth (HOSPITAL_COMMUNITY): Payer: Self-pay | Admitting: *Deleted

## 2019-11-10 NOTE — Telephone Encounter (Signed)
Patient given detailed instructions per Myocardial Perfusion Study Information Sheet for the test on  11/12/19. Patient notified to arrive 15 minutes early and that it is imperative to arrive on time for appointment to keep from having the test rescheduled.  If you need to cancel or reschedule your appointment, please call the office within 24 hours of your appointment. . Patient verbalized understanding. Kirstie Peri

## 2019-11-12 ENCOUNTER — Ambulatory Visit (HOSPITAL_COMMUNITY): Payer: BC Managed Care – PPO | Attending: Cardiology

## 2019-11-12 ENCOUNTER — Other Ambulatory Visit: Payer: Self-pay

## 2019-11-12 VITALS — Ht 71.0 in | Wt >= 6400 oz

## 2019-11-12 DIAGNOSIS — Z01810 Encounter for preprocedural cardiovascular examination: Secondary | ICD-10-CM | POA: Diagnosis not present

## 2019-11-12 DIAGNOSIS — I5032 Chronic diastolic (congestive) heart failure: Secondary | ICD-10-CM | POA: Diagnosis present

## 2019-11-12 MED ORDER — TECHNETIUM TC 99M TETROFOSMIN IV KIT
31.4000 | PACK | Freq: Once | INTRAVENOUS | Status: AC | PRN
  Administered 2019-11-12: 31.4 via INTRAVENOUS
  Filled 2019-11-12: qty 32

## 2019-11-12 MED ORDER — REGADENOSON 0.4 MG/5ML IV SOLN
0.4000 mg | Freq: Once | INTRAVENOUS | Status: AC
  Administered 2019-11-13: 0.4 mg via INTRAVENOUS

## 2019-11-13 ENCOUNTER — Ambulatory Visit (HOSPITAL_COMMUNITY): Payer: BC Managed Care – PPO | Attending: Cardiology

## 2019-11-13 DIAGNOSIS — Z01810 Encounter for preprocedural cardiovascular examination: Secondary | ICD-10-CM | POA: Diagnosis not present

## 2019-11-13 DIAGNOSIS — I5032 Chronic diastolic (congestive) heart failure: Secondary | ICD-10-CM | POA: Insufficient documentation

## 2019-11-13 LAB — MYOCARDIAL PERFUSION IMAGING
LV dias vol: 136 mL (ref 62–150)
LV sys vol: 51 mL
Peak HR: 82 {beats}/min
Rest HR: 60 {beats}/min
SDS: 1
SRS: 0
SSS: 1
TID: 1.06

## 2019-11-13 MED ORDER — TECHNETIUM TC 99M TETROFOSMIN IV KIT
30.7000 | PACK | Freq: Once | INTRAVENOUS | Status: AC | PRN
  Administered 2019-11-13: 30.7 via INTRAVENOUS
  Filled 2019-11-13: qty 31

## 2019-11-18 ENCOUNTER — Other Ambulatory Visit: Payer: Self-pay | Admitting: Family Medicine

## 2019-11-18 NOTE — Telephone Encounter (Signed)
Electronic refill request. Torsemide Last office visit:   09/01/2019 Last Filled:    30 tablet 2 08/26/2019  Patient apparently is taking these daily rather than as needed.  Is that ok?

## 2019-11-19 NOTE — Telephone Encounter (Signed)
Sent. Please verify use/frequency with patient.  Thanks.

## 2019-11-20 NOTE — Telephone Encounter (Signed)
Patient is using this daily on a regular basis.  Patient says he thinks this helps keep the inflammation down in his ankles and he can tell the difference if he doesn't take it.  Can we change his sig to daily?  I think I have had this conversation with him in the past (maybe a few times) but it continues to come up with his refills.

## 2019-11-21 MED ORDER — TORSEMIDE 20 MG PO TABS: 20.0000 mg | ORAL_TABLET | Freq: Every day | ORAL | 2 refills | Status: DC

## 2019-11-21 NOTE — Telephone Encounter (Signed)
Resent with new sig.  Thanks.

## 2019-12-08 ENCOUNTER — Ambulatory Visit: Payer: Self-pay

## 2019-12-08 ENCOUNTER — Encounter: Payer: Self-pay | Admitting: Family Medicine

## 2019-12-08 ENCOUNTER — Ambulatory Visit: Payer: BC Managed Care – PPO | Admitting: Family Medicine

## 2019-12-08 ENCOUNTER — Other Ambulatory Visit: Payer: Self-pay

## 2019-12-08 ENCOUNTER — Ambulatory Visit (INDEPENDENT_AMBULATORY_CARE_PROVIDER_SITE_OTHER): Payer: BC Managed Care – PPO | Admitting: Psychology

## 2019-12-08 VITALS — BP 129/73 | HR 69 | Ht 71.0 in | Wt 393.0 lb

## 2019-12-08 DIAGNOSIS — F509 Eating disorder, unspecified: Secondary | ICD-10-CM | POA: Diagnosis not present

## 2019-12-08 DIAGNOSIS — M170 Bilateral primary osteoarthritis of knee: Secondary | ICD-10-CM | POA: Diagnosis not present

## 2019-12-08 MED ORDER — TRIAMCINOLONE ACETONIDE 40 MG/ML IJ SUSP
40.0000 mg | Freq: Once | INTRAMUSCULAR | Status: AC
  Administered 2019-12-08: 40 mg via INTRA_ARTICULAR

## 2019-12-08 NOTE — Patient Instructions (Signed)
Good to see you Please try ice as needed   Please send me a message in MyChart with any questions or updates.  Please see me back in 3 months.   --Dr. Jaylynne Birkhead  

## 2019-12-08 NOTE — Progress Notes (Signed)
Jahni Nazar - 64 y.o. male MRN [id number]  Date of birth: 08-04-1955  SUBJECTIVE:  Including CC & ROS.  Chief Complaint  Patient presents with  . Knee Pain    bilateral    Zackariah Vanderpol is a 64 y.o. male that is presenting with acute worsening of his bilateral knee pain.  He denies any falls or problems in the interim.  The pain is over the medial aspect of each knee.  Has had improvement with injections previously.   Review of Systems See HPI   HISTORY: Past Medical, Surgical, Social, and Family History Reviewed & Updated per EMR.   Pertinent Historical Findings include:  Past Medical History:  Diagnosis Date  . Achalasia    with prev eval at [facility].  No intervention as of 2012  . Aspiration pneumonia ([place]) 10/05/2014  . Backache, unspecified   . Cardiac pacemaker St. Jude    ERI 2008  . Depressive disorder, not elsewhere classified   . Diabetes ([place])   . Esophageal reflux   . Morbid obesity ([place])   . Obstructive sleep apnea    biapap settign 25-22  . Presence of permanent cardiac pacemaker    St. Jude- Dr. Caryl Comes follows -device Check 07-06-14  . Sinus bradycardia /pauses   . Stricture and stenosis of esophagus     Past Surgical History:  Procedure Laterality Date  . BALLOON DILATION N/A 10/06/2015   Procedure: BALLOON DILATION;  Surgeon: Mauri Pole, MD;  Location: [facility];  Service: Endoscopy;  Laterality: N/A;  . BIOPSY  05/06/2018   Procedure: BIOPSY;  Surgeon: Rush Landmark Telford Nab., MD;  Location: [facility];  Service: Gastroenterology;;  . COLONOSCOPY W/ POLYPECTOMY  11/01/2010  . COLONOSCOPY WITH PROPOFOL N/A 10/05/2014   Procedure: COLONOSCOPY WITH PROPOFOL;  Surgeon: Gatha Mayer, MD;  Location: [facility];  Service: Endoscopy;  Laterality: N/A;  . COLONOSCOPY WITH PROPOFOL N/A 05/06/2018   Procedure: COLONOSCOPY WITH PROPOFOL;  Surgeon: Rush Landmark Telford Nab., MD;  Location: [facility];  Service: Gastroenterology;  Laterality: N/A;  . ESOPHAGEAL  DILATION    . ESOPHAGEAL MANOMETRY N/A 08/23/2015   Procedure: ESOPHAGEAL MANOMETRY (EM);  Surgeon: Mauri Pole, MD;  Location: [facility];  Service: Endoscopy;  Laterality: N/A;  . ESOPHAGOGASTRODUODENOSCOPY N/A 10/09/2014   Procedure: ESOPHAGOGASTRODUODENOSCOPY (EGD);  Surgeon: Gatha Mayer, MD;  Location: [facility];  Service: Endoscopy;  Laterality: N/A;  . ESOPHAGOGASTRODUODENOSCOPY (EGD) WITH PROPOFOL N/A 08/23/2015   Procedure: ESOPHAGOGASTRODUODENOSCOPY (EGD) WITH PROPOFOL;  Surgeon: Mauri Pole, MD;  Location: [facility];  Service: Endoscopy;  Laterality: N/A;  . ESOPHAGOGASTRODUODENOSCOPY (EGD) WITH PROPOFOL N/A 10/06/2015   Procedure: ESOPHAGOGASTRODUODENOSCOPY (EGD) WITH PROPOFOL;  Surgeon: Mauri Pole, MD;  Location: [facility];  Service: Endoscopy;  Laterality: N/A;  Rigiflex ballon size 19m-35mm size 45 minute proc,need Fluro Gastografin esophagram 2 hrs post EGD   . PACEMAKER INSERTION    . POLYPECTOMY  05/06/2018   Procedure: POLYPECTOMY;  Surgeon: Mansouraty, GTelford Nab, MD;  [facility] [facility]  Service: Gastroenterology;;  . PBetsy PriesGENERATOR CHANGEOUT N/A 05/01/2018   Procedure: PPM GENERATOR CHANGEOUT;  Surgeon: KDeboraha Sprang MD;  [facility];  Service: Cardiovascular;  Laterality: N/A;    Family History  Problem Relation Age of Onset  . Heart attack Father   . Heart disease Father   . Cancer Father        multiple myeloma  . Hypertension Father   . Prostate cancer Father  possible dx  . Hypertension Mother   . Dementia Mother   . Colon cancer Neg Hx     Social History   Socioeconomic History  . Marital status: Married    Spouse name: Not on file  . Number of children: 2  . Years of education: Not on file  . Highest education level: Not on file  Occupational History  . 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 Occupation: singer    Employer: [employer]  Tobacco Use  . Smoking status: Former Smoker     Packs/day: 1.00    Years: 20.00    Pack years: 20.00    Types: Cigarettes    Quit date: 05/29/2004    Years since quitting: 15.5  . Smokeless tobacco: Never Used  Vaping Use  . Vaping Use: Never used  Substance and Sexual Activity  . Alcohol use: Yes    Comment: rarely  . Drug use: No  . Sexual activity: Yes  Other Topics Concern  . Not on file  Social History Narrative   From Air cabin crew   Married   Social Determinants of Health   Financial Resource Strain:   . Difficulty of Paying Living Expenses:   Food Insecurity:   . Worried About Charity fundraiser in the Last Year:   . Arboriculturist in the Last Year:   Transportation Needs:   . Film/video editor (Medical):   Marland Kitchen Lack of Transportation (Non-Medical):   Physical Activity:   . Days of Exercise per Week:   . Minutes of Exercise per Session:   Stress:   . Feeling of Stress :   Social Connections:   . Frequency of Communication with Friends and Family:   . Frequency of Social Gatherings with Friends and Family:   . Attends Religious Services:   . Active Member of Clubs or Organizations:   . Attends Archivist Meetings:   Marland Kitchen Marital Status:   Intimate Partner Violence:   . Fear of Current or Ex-Partner:   . Emotionally Abused:   Marland Kitchen Physically Abused:   . Sexually Abused:      PHYSICAL EXAM:  VS: BP 129/73   Pulse 69   Ht '5\' 11"'  (1.803 m)   Wt (!) 393 lb (178.3 kg)   BMI 54.81 kg/m  Physical Exam Gen: NAD, alert, cooperative with exam, well-appearing MSK:  Right and left knee: Mild effusion. Normal range of motion. Tenderness palpation of the medial joint. Normal strength resistance. Neurovascularly intact   Aspiration/Injection Procedure Note Dodge Ator 02-22-1956  Procedure: Injection Indications: Right knee pain  Procedure Details Consent: Risks of procedure as well as the alternatives and risks of each were explained to the (patient/caregiver).  Consent for  procedure obtained. Time Out: Verified patient identification, verified procedure, site/side was marked, verified correct patient position, special equipment/implants available, medications/allergies/relevent history reviewed, required imaging and test results available.  Performed.  The area was cleaned with iodine and alcohol swabs.    The right knee superior lateral suprapatellar pouch was injected using 1 cc's of 40 mg Kenalog and 4 cc's of 0.25% bupivacaine with a 22 2" needle.  Ultrasound was used. Images were obtained in long views showing the injection.     A sterile dressing was applied.  Patient did tolerate procedure well.  Aspiration/Injection Procedure Note Trendon Zaring 1955-11-03  Procedure: Injection Indications: Left knee pain  Procedure Details Consent: Risks of procedure as well as the alternatives and risks  of each were explained to the (patient/caregiver).  Consent for procedure obtained. Time Out: Verified patient identification, verified procedure, site/side was marked, verified correct patient position, special equipment/implants available, medications/allergies/relevent history reviewed, required imaging and test results available.  Performed.  The area was cleaned with iodine and alcohol swabs.    The left knee superior lateral suprapatellar pouch was injected using 1 cc's of 40 mg Kenalog and 4 cc's of 0.25% bupivacaine with a 22 2" needle.  Ultrasound was used. Images were obtained in long views showing the injection.     A sterile dressing was applied.  Patient did tolerate procedure well.  ASSESSMENT & PLAN:   OA (osteoarthritis) of knee Acute worsening of his knee pain.  He has been focusing on losing weight in preparation for his bariatric surgery. -Counseled on home exercise therapy and supportive care. -Injections today.

## 2019-12-08 NOTE — Assessment & Plan Note (Signed)
Acute worsening of his knee pain.  He has been focusing on losing weight in preparation for his bariatric surgery. -Counseled on home exercise therapy and supportive care. -Injections today.

## 2019-12-14 ENCOUNTER — Other Ambulatory Visit: Payer: Self-pay | Admitting: Family Medicine

## 2019-12-15 ENCOUNTER — Ambulatory Visit: Payer: BC Managed Care – PPO | Admitting: Psychology

## 2019-12-22 ENCOUNTER — Ambulatory Visit: Payer: BC Managed Care – PPO | Admitting: Psychology

## 2019-12-22 ENCOUNTER — Telehealth: Payer: Self-pay | Admitting: Family Medicine

## 2019-12-22 NOTE — Telephone Encounter (Signed)
Patient in office about 2 wks ago for bilateral knee injections for pain ( he states no improvement, pain still present---injections had no effect at all)   ---Pt request provider to call him @ [phone] to discuss next step.  --glh

## 2019-12-22 NOTE — Telephone Encounter (Signed)
Left VM for patient. If he calls back please have him speak with a nurse/CMA and inform that we can try an injection of the knee with Toradol.  We could consider a course of prednisone.  Could provide Duexis samples will try ibuprofen..   If any questions then please take the best time and phone number to call and I will try to call him back.   Rosemarie Ax, MD [facility] 12/22/2019, 1:57 PM

## 2020-01-05 ENCOUNTER — Other Ambulatory Visit: Payer: Self-pay | Admitting: Family Medicine

## 2020-01-06 ENCOUNTER — Encounter: Payer: Self-pay | Admitting: Family Medicine

## 2020-01-10 ENCOUNTER — Other Ambulatory Visit: Payer: Self-pay | Admitting: Family Medicine

## 2020-01-12 ENCOUNTER — Ambulatory Visit: Payer: BC Managed Care – PPO | Admitting: Nurse Practitioner

## 2020-01-12 NOTE — Telephone Encounter (Signed)
Would we be able to accommodate a nurse visit on a Monday for him?

## 2020-01-16 ENCOUNTER — Telehealth: Payer: Self-pay | Admitting: Family Medicine

## 2020-01-16 MED ORDER — CEPHALEXIN 500 MG PO CAPS: ORAL_CAPSULE | ORAL | 0 refills | Status: DC

## 2020-01-16 NOTE — Telephone Encounter (Signed)
Glenn Lyons called stating pt needs appointment Monday for cellulitis both legs and feet.  She stated pt is having a lot of trouble walking no fever sob chestpain.    She is wanting to know if you could call him in something for this  Pt is out of state in [place].  She stated he could come in Monday,  They ware wanting to know if you would call in Cephalexin  [place] in [place]  No appointment for md has been made yet  Please advise

## 2020-01-16 NOTE — Telephone Encounter (Signed)
Left detailed message on voicemail of wife who initiated the call.

## 2020-01-16 NOTE — Telephone Encounter (Signed)
Start keflex, rx sent.  If worse in the meantime then seek eval.  Please schedule for Monday.  Thanks.

## 2020-01-19 ENCOUNTER — Telehealth: Payer: Self-pay | Admitting: *Deleted

## 2020-01-19 ENCOUNTER — Ambulatory Visit: Payer: BC Managed Care – PPO

## 2020-01-19 NOTE — Telephone Encounter (Signed)
   Primary Cardiologist: Dr Percival Spanish  Chart reviewed and patient contacted today by phone as part of pre-operative protocol coverage. Given past medical history and time since last visit, based on ACC/AHA guidelines, Glenn Lyons would be at acceptable risk for the planned procedure without further cardiovascular testing.   OK to hold aspirin 5-7 days pre op if needed.  I will route this recommendation to the requesting party via Epic fax function and remove from pre-op pool.  Please call with questions.  Kerin Ransom, PA-C 01/19/2020, 3:00 PM

## 2020-01-19 NOTE — Telephone Encounter (Signed)
     PRIMARY CARDIOLOGIST DR [facility] Pre-operative Risk Assessment      Request for surgical clearance:  1. What type of surgery is being performed? BARIATRIC SURGERY   2. When is this surgery scheduled? TBD   3. What type of clearance is required (medical clearance vs. Pharmacy clearance to hold med vs. Both)? MEDICAL  4. Are there any medications that need to be held prior to surgery and how long? ASPIRIN 81 MG  5. Practice name and name of physician performing surgery? [facility] : DR ERIC WILSON    6. What is the office phone number? [phone]   7.   What is the office fax number? [facility]  8.   Anesthesia type (None, local, MAC, general) ? GENERAL   Raiford Simmonds 01/19/2020, 12:23 PM  _________________________________________________________________   (provider comments below)

## 2020-01-19 NOTE — Telephone Encounter (Signed)
Patient returned call

## 2020-01-19 NOTE — Telephone Encounter (Signed)
Left message to call back  Kerin Ransom PA-C 01/19/2020 2:14 PM

## 2020-01-22 ENCOUNTER — Other Ambulatory Visit: Payer: Self-pay | Admitting: General Surgery

## 2020-01-26 ENCOUNTER — Ambulatory Visit: Payer: BC Managed Care – PPO | Admitting: Family Medicine

## 2020-01-26 ENCOUNTER — Encounter: Payer: Self-pay | Admitting: Family Medicine

## 2020-01-26 ENCOUNTER — Other Ambulatory Visit: Payer: Self-pay

## 2020-01-26 VITALS — BP 126/78 | HR 84 | Temp 96.3°F | Ht 71.0 in | Wt 392.5 lb

## 2020-01-26 DIAGNOSIS — Z23 Encounter for immunization: Secondary | ICD-10-CM

## 2020-01-26 DIAGNOSIS — L0390 Cellulitis, unspecified: Secondary | ICD-10-CM

## 2020-01-26 DIAGNOSIS — R1310 Dysphagia, unspecified: Secondary | ICD-10-CM

## 2020-01-26 DIAGNOSIS — E1140 Type 2 diabetes mellitus with diabetic neuropathy, unspecified: Secondary | ICD-10-CM | POA: Diagnosis not present

## 2020-01-26 LAB — MICROALBUMIN / CREATININE URINE RATIO
Creatinine,U: 155.7 mg/dL
Microalb Creat Ratio: 1.4 mg/g (ref 0.0–30.0)
Microalb, Ur: 2.2 mg/dL — ABNORMAL HIGH (ref 0.0–1.9)

## 2020-01-26 LAB — COMPREHENSIVE METABOLIC PANEL
ALT: 22 U/L (ref 0–53)
AST: 15 U/L (ref 0–37)
Albumin: 3.8 g/dL (ref 3.5–5.2)
Alkaline Phosphatase: 100 U/L (ref 39–117)
BUN: 13 mg/dL (ref 6–23)
CO2: 31 mEq/L (ref 19–32)
Calcium: 9.8 mg/dL (ref 8.4–10.5)
Chloride: 99 mEq/L (ref 96–112)
Creatinine, Ser: 0.98 mg/dL (ref 0.40–1.50)
GFR: 77.36 mL/min (ref >60.00)
Glucose, Bld: 207 mg/dL — ABNORMAL HIGH (ref 70–99)
Potassium: 4.3 mEq/L (ref 3.5–5.1)
Sodium: 137 mEq/L (ref 135–145)
Total Bilirubin: 0.6 mg/dL (ref 0.2–1.2)
Total Protein: 6.2 g/dL (ref 6.0–8.3)

## 2020-01-26 LAB — TSH: TSH: 2.44 u[IU]/mL (ref 0.35–4.50)

## 2020-01-26 LAB — HEMOGLOBIN A1C: Hgb A1c MFr Bld: 9.2 % — ABNORMAL HIGH (ref 4.6–6.5)

## 2020-01-26 MED ORDER — BASAGLAR KWIKPEN 100 UNIT/ML ~~LOC~~ SOPN
60.0000 [IU] | PEN_INJECTOR | Freq: Every day | SUBCUTANEOUS | Status: DC
Start: 2020-01-26 — End: 2020-01-26

## 2020-01-26 MED ORDER — BASAGLAR KWIKPEN 100 UNIT/ML ~~LOC~~ SOPN
60.0000 [IU] | PEN_INJECTOR | Freq: Every day | SUBCUTANEOUS | 12 refills | Status: DC
Start: 2020-01-26 — End: 2020-04-06

## 2020-01-26 NOTE — Progress Notes (Signed)
This visit occurred during the SARS-CoV-2 public health emergency.  Safety protocols were in place, including screening questions prior to the visit, additional usage of staff PPE, and extensive cleaning of exam room while observing appropriate contact time as indicated for disinfecting solutions.  D/w pt about 2nd shingles shot.  Done at office visit.  D/w pt about cellulitis and weight.  Recently course of abx done for cellulitis with resolution.  Redness improved.  No fevers.  DM2-taking insulin 60-70 units.  Usually with sugar in the 200s.  No sugars >300.  Due for labs.  See notes on labs.  He is going to have esophageal imaging done prior to consideration of gastric bypass surgery.  Discussed goals for weight loss.  He is working full time in [place], staying in a hotel when there.  That is going to be going on for a few more months.  D/w pt about diet, exercise, and sleep when he is staying in a hotel.    Meds, vitals, and allergies reviewed.   ROS: Per HPI unless specifically indicated in ROS section   GEN: nad, alert and oriented HEENT: ncat NECK: supple w/o LA CV: rrr PULM: ctab, no inc wob ABD: soft, +bs EXT: no edema SKIN: no acute rash  Diabetic foot exam: Normal inspection No skin breakdown No calluses  Normal DP pulses Normal sensation to light touch and monofilament Nails normal except for 1st nails thickened.

## 2020-01-26 NOTE — Patient Instructions (Addendum)
Go to the lab on the way out.   If you have mychart we'll likely use that to update you.    Add 1 unit of insulin daily until your AM sugars are below 200.   We'll go from there.  Take care.  Glad to see you.

## 2020-01-28 ENCOUNTER — Other Ambulatory Visit: Payer: Self-pay | Admitting: Family Medicine

## 2020-01-28 DIAGNOSIS — Z23 Encounter for immunization: Secondary | ICD-10-CM | POA: Insufficient documentation

## 2020-01-28 MED ORDER — LISINOPRIL 2.5 MG PO TABS: 2.5000 mg | ORAL_TABLET | Freq: Every day | ORAL | 3 refills | Status: DC

## 2020-01-28 NOTE — Assessment & Plan Note (Signed)
He is going to follow-up for esophageal imaging prior to consideration of gastric bypass surgery.  Awaiting follow-up imaging.

## 2020-01-28 NOTE — Assessment & Plan Note (Signed)
No change in medications at this point.  See notes on labs.  Continue work on diet and weight loss.  Discussed.  See above.

## 2020-01-28 NOTE — Assessment & Plan Note (Signed)
Done at office visit.

## 2020-01-28 NOTE — Assessment & Plan Note (Signed)
Resolved

## 2020-02-09 ENCOUNTER — Ambulatory Visit
Admission: RE | Admit: 2020-02-09 | Discharge: 2020-02-09 | Disposition: A | Discharge status: Home / Self Care | Payer: BC Managed Care – PPO | Source: Ambulatory Visit | Attending: General Surgery | Admitting: General Surgery

## 2020-02-09 ENCOUNTER — Other Ambulatory Visit: Payer: Self-pay | Admitting: General Surgery

## 2020-02-13 ENCOUNTER — Telehealth: Payer: Self-pay | Admitting: Family Medicine

## 2020-02-13 MED ORDER — CEPHALEXIN 500 MG PO CAPS
ORAL_CAPSULE | ORAL | 0 refills | Status: DC
Start: 2020-02-13 — End: 2020-04-06

## 2020-02-13 MED ORDER — FLUCONAZOLE 150 MG PO TABS: 150.0000 mg | ORAL_TABLET | Freq: Once | ORAL | 0 refills | Status: AC

## 2020-02-13 NOTE — Telephone Encounter (Signed)
Patient is returning Rena's phone call to be triaged - he states he is free anytime now.

## 2020-02-13 NOTE — Telephone Encounter (Signed)
Please triage patient.  How is his sugar recently?  See what details you can get and let me know.  Thanks.

## 2020-02-13 NOTE — Telephone Encounter (Signed)
Patient called back and states the issue he needs for his groin issues is Fluconazole.  FYI to Dr. Damita Dunnings

## 2020-02-13 NOTE — Telephone Encounter (Signed)
Agreed, rx sent.  Thanks.

## 2020-02-13 NOTE — Telephone Encounter (Signed)
I spoke with pt; pts cellulitis had resolved at last visit 01/26/20.now pt has redness, legs are so swollen they are shiny; top of feet are not swollen but there is more swelling on knees. Last shot for knees did not help. There are no blisters but pt does have warmth to both legs; both legs are very painful especially when trying to walk; pain level is 7- 8; pt is not sure if having a fever or not; pt has not taken temp but at times may feel warm. Pt elevates legs when sitting even when in motel room pt tries to elevate legs; pt is in [place] during the week and comes home on Sat night and goes back on Monday night or Tues morning. Pt has increased Basaglar insulin ot 75 U at night and FBS is still in low 200's.  Pt said he is also very sleepy. Pt is supposed to have bariatric bypass next month and his hgb A1c went from 10.8 - 9.2. pt also said he feels like he has yeast on penis and wants Fluconazole along with med for cellulitis. [facility] [address]  Pharmacy will alert pt when med is ready for pick up.

## 2020-02-13 NOTE — Telephone Encounter (Signed)
pts wife does not answer and no v/m; left v/m for pt to cb on his phone.

## 2020-02-13 NOTE — Telephone Encounter (Signed)
Arbie Cookey called stating pt has cellulites again in both his legs she stated pt says in starting in his groin area again.  They are needing rx for both cellulites for his legs and the groin area  She stated this is 2 different meds.  She had no idea the name of the med   [facility]in  [facility] [address]  [place] out of [place]

## 2020-02-24 ENCOUNTER — Encounter: Payer: Self-pay | Admitting: Family Medicine

## 2020-02-27 ENCOUNTER — Other Ambulatory Visit: Payer: Self-pay | Admitting: Family Medicine

## 2020-02-27 MED ORDER — MELOXICAM 7.5 MG PO TABS: 7.5000 mg | ORAL_TABLET | Freq: Every day | ORAL | 2 refills | Status: DC

## 2020-03-01 ENCOUNTER — Encounter: Payer: Self-pay | Admitting: Family Medicine

## 2020-03-01 ENCOUNTER — Ambulatory Visit: Payer: Self-pay

## 2020-03-01 ENCOUNTER — Ambulatory Visit (INDEPENDENT_AMBULATORY_CARE_PROVIDER_SITE_OTHER): Payer: BC Managed Care – PPO | Admitting: Family Medicine

## 2020-03-01 ENCOUNTER — Other Ambulatory Visit: Payer: Self-pay

## 2020-03-01 VITALS — BP 141/64 | HR 72 | Ht 71.0 in | Wt 392.0 lb

## 2020-03-01 DIAGNOSIS — M170 Bilateral primary osteoarthritis of knee: Secondary | ICD-10-CM

## 2020-03-01 NOTE — Patient Instructions (Signed)
Good to see you Please try ice   Please send me a message in MyChart with any questions or updates.  Please see me back in 2 months .   --Dr. Raeford Razor

## 2020-03-01 NOTE — Progress Notes (Signed)
Medication Samples have been provided to the patient.  Drug name: Zilretta       Strength: 32mg         Qty: 2 boxes (2 vials)  LOT [id number]:  Exp.Date: JAN 2022  Dosing instructions: 1 injection per knee.  The patient has been instructed regarding the correct time, dose, and frequency of taking this medication, including desired effects and most common side effects.   Sherrie George, MA 1:38 PM 03/01/2020

## 2020-03-01 NOTE — Progress Notes (Signed)
Glenn Lyons - 64 y.o. male MRN [id number]  Date of birth: 1955-11-24  SUBJECTIVE:  Including CC & ROS.  Chief Complaint  Patient presents with  . Knee Pain    bilateral     Glenn Lyons is a 64 y.o. male that is presenting with acute on chronic bilateral knee pain.  He did not receive any improvement with previous injection.  He does have bariatric surgery scheduled in November.  No acute falls or trauma.   Review of Systems See HPI   HISTORY: Past Medical, Surgical, Social, and Family History Reviewed & Updated per EMR.   Pertinent Historical Findings include:  Past Medical History:  Diagnosis Date  . Achalasia    with prev eval at [facility].  No intervention as of 2012  . Aspiration pneumonia ([place]) 10/05/2014  . Backache, unspecified   . Cardiac pacemaker St. Jude    ERI 2008  . Depressive disorder, not elsewhere classified   . Diabetes ([place])   . Esophageal reflux   . Morbid obesity ([place])   . Obstructive sleep apnea    biapap settign 25-22  . Presence of permanent cardiac pacemaker    St. Jude- Dr. Caryl Comes follows -device Check 07-06-14  . Sinus bradycardia /pauses   . Stricture and stenosis of esophagus     Past Surgical History:  Procedure Laterality Date  . BALLOON DILATION N/A 10/06/2015   Procedure: BALLOON DILATION;  Surgeon: Mauri Pole, MD;  Location: [facility];  Service: Endoscopy;  Laterality: N/A;  . BIOPSY  05/06/2018   Procedure: BIOPSY;  Surgeon: Rush Landmark Telford Nab., MD;  Location: [place];  Service: Gastroenterology;;  . COLONOSCOPY W/ POLYPECTOMY  11/01/2010  . COLONOSCOPY WITH PROPOFOL N/A 10/05/2014   Procedure: COLONOSCOPY WITH PROPOFOL;  Surgeon: Gatha Mayer, MD;  Location: [facility];  Service: Endoscopy;  Laterality: N/A;  . COLONOSCOPY WITH PROPOFOL N/A 05/06/2018   Procedure: COLONOSCOPY WITH PROPOFOL;  Surgeon: Rush Landmark Telford Nab., MD;  Location: [facility];  Service: Gastroenterology;  Laterality: N/A;  . ESOPHAGEAL DILATION      . ESOPHAGEAL MANOMETRY N/A 08/23/2015   Procedure: ESOPHAGEAL MANOMETRY (EM);  Surgeon: Mauri Pole, MD;  Location: [facility];  Service: Endoscopy;  Laterality: N/A;  . ESOPHAGOGASTRODUODENOSCOPY N/A 10/09/2014   Procedure: ESOPHAGOGASTRODUODENOSCOPY (EGD);  Surgeon: Gatha Mayer, MD;  Location: [facility];  Service: Endoscopy;  Laterality: N/A;  . ESOPHAGOGASTRODUODENOSCOPY (EGD) WITH PROPOFOL N/A 08/23/2015   Procedure: ESOPHAGOGASTRODUODENOSCOPY (EGD) WITH PROPOFOL;  Surgeon: Mauri Pole, MD;  Location: [facility];  Service: Endoscopy;  Laterality: N/A;  . ESOPHAGOGASTRODUODENOSCOPY (EGD) WITH PROPOFOL N/A 10/06/2015   Procedure: ESOPHAGOGASTRODUODENOSCOPY (EGD) WITH PROPOFOL;  Surgeon: Mauri Pole, MD;  Location: [facility];  Service: Endoscopy;  Laterality: N/A;  Rigiflex ballon size 12m-35mm size 45 minute proc,need Fluro Gastografin esophagram 2 hrs post EGD   . PACEMAKER INSERTION    . POLYPECTOMY  05/06/2018   Procedure: POLYPECTOMY;  Surgeon: Mansouraty, GTelford Nab, MD;  [place] [facility]  Service: Gastroenterology;;  . PBetsy PriesGENERATOR CHANGEOUT N/A 05/01/2018   Procedure: PPM GENERATOR CHANGEOUT;  Surgeon: KDeboraha Sprang MD;  [facility];  Service: Cardiovascular;  Laterality: N/A;    Family History  Problem Relation Age of Onset  . Heart attack Father   . Heart disease Father   . Cancer Father        multiple myeloma  . Hypertension Father   . Prostate cancer Father        possible  dx  . Hypertension Mother   . Dementia Mother   . Colon cancer Neg Hx     Social History   Socioeconomic History  . Marital status: Married    Spouse name: Not on file  . Number of children: 2  . Years of education: Not on file  . Highest education level: Not on file  Occupational History  . 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 Occupation: singer    Employer: [employer]  Tobacco Use  . Smoking status: Former Smoker    Packs/day:  1.00    Years: 20.00    Pack years: 20.00    Types: Cigarettes    Quit date: 05/29/2004    Years since quitting: 15.7  . Smokeless tobacco: Never Used  Vaping Use  . Vaping Use: Never used  Substance and Sexual Activity  . Alcohol use: Yes    Comment: rarely  . Drug use: No  . Sexual activity: Yes  Other Topics Concern  . Not on file  Social History Narrative   From Air cabin crew   Married   Social Determinants of Health   Financial Resource Strain:   . Difficulty of Paying Living Expenses: Not on file  Food Insecurity:   . Worried About Charity fundraiser in the Last Year: Not on file  . Ran Out of Food in the Last Year: Not on file  Transportation Needs:   . Lack of Transportation (Medical): Not on file  . Lack of Transportation (Non-Medical): Not on file  Physical Activity:   . Days of Exercise per Week: Not on file  . Minutes of Exercise per Session: Not on file  Stress:   . Feeling of Stress : Not on file  Social Connections:   . Frequency of Communication with Friends and Family: Not on file  . Frequency of Social Gatherings with Friends and Family: Not on file  . Attends Religious Services: Not on file  . Active Member of Clubs or Organizations: Not on file  . Attends Archivist Meetings: Not on file  . Marital Status: Not on file  Intimate Partner Violence:   . Fear of Current or Ex-Partner: Not on file  . Emotionally Abused: Not on file  . Physically Abused: Not on file  . Sexually Abused: Not on file     PHYSICAL EXAM:  VS: BP (!) 141/64   Pulse 72   Ht '5\' 11"'  (1.803 m)   Wt (!) 392 lb (177.8 kg)   BMI 54.67 kg/m  Physical Exam Gen: NAD, alert, cooperative with exam, well-appearing MSK:  Right and left knee: Effusions bilaterally. Tenderness to palpation of the medial joint space. Limited flexion. Neurovascularly intact   Aspiration/Injection Procedure Note Alicia Ackert 18-Oct-1955  Procedure: Injection Indications:  Left knee pain  Procedure Details Consent: Risks of procedure as well as the alternatives and risks of each were explained to the (patient/caregiver).  Consent for procedure obtained. Time Out: Verified patient identification, verified procedure, site/side was marked, verified correct patient position, special equipment/implants available, medications/allergies/relevent history reviewed, required imaging and test results available.  Performed.  The area was cleaned with iodine and alcohol swabs.    The left knee superior lateral suprapatellar pouch was injected using 4 cc of 1% lidocaine on a 21-gauge 2 inch needle.  The syringe was switched and a mixture containing 5 cc's of 32 mg Zilretta and 3 cc's of 0.25% bupivacaine was injected.  Ultrasound was used. Images were  obtained in long views showing the injection.     A sterile dressing was applied.  Patient did tolerate procedure well.  Aspiration/Injection Procedure Note Glenn Lyons April 09, 1956  Procedure: Injection Indications: Right knee pain  Procedure Details Consent: Risks of procedure as well as the alternatives and risks of each were explained to the (patient/caregiver).  Consent for procedure obtained. Time Out: Verified patient identification, verified procedure, site/side was marked, verified correct patient position, special equipment/implants available, medications/allergies/relevent history reviewed, required imaging and test results available.  Performed.  The area was cleaned with iodine and alcohol swabs.    The right knee superior lateral suprapatellar pouch was injected using 4 cc of 1% lidocaine on a 21-gauge 2 inch needle.  The syringe was switched and a mixture containing 5 cc's of 32 mg Zilretta and 3 cc's of 0.25% bupivacaine was injected.  Ultrasound was used. Images were obtained in long views showing the injection.     A sterile dressing was applied.  Patient did tolerate procedure well.   ASSESSMENT & PLAN:    OA (osteoarthritis) of knee Acute on chronic in nature.  Received little improvement with previous injections in July. - counseled on supportive care - injections bilaterally

## 2020-03-01 NOTE — Assessment & Plan Note (Signed)
Acute on chronic in nature.  Received little improvement with previous injections in July. - counseled on supportive care - injections bilaterally

## 2020-03-03 ENCOUNTER — Ambulatory Visit: Payer: Self-pay | Admitting: General Surgery

## 2020-03-03 NOTE — H&P (Signed)
Marcella Dunnaway Cogbill Appointment: 03/03/2020 8:30 AM Location: [facility] Patient #: [id number] DOB: 05-Dec-1955 Single / Language: Cleophus Molt / Race: White Male  History of Present Illness Randall Hiss M. Torrance Stockley MD; 03/03/2020 9:53 AM) The patient is a 64 year old male who presents for a bariatric surgery evaluation. He comes in for long-term follow-up today regarding his severe obesity. I last saw him in July. His blood sugars have improved. His A1c went from 10.8 down to 9.2. His physician increased his insulin. He states that other than that the only medical change has been worsening knee pain and a little bit more difficulty ambulating first thing in the morning. He had an injection in his knees last week. He denies any chest pain or chest pressure shortness of breath. He denies any vomiting. He denies any regurgitation. He denies any heartburn. Occasionally he will have a sticky sensation with solids and occasional liquids but is nothing like it was 5 years ago prior to his dilatation. He has achalasia.  12/04/19 He comes in for long-term follow-up regarding his severe obesity. I initially met him a few years ago to discuss weight loss surgery. He states that he is really interested in discussing in considering weight loss surgery because he stopped the process after 2019 because he thought he could do it on his own. However he has tried additional attempts at sustained weight loss and is been unsuccessful in his knee pain is worsening which is now affecting his mobility. His diabetes is also worsening which is prompting him to reconsider weight loss surgery. otherwise He denies any significant medical changes since I last saw him.  His comorbidities include dyslipidemia, obstructive sleep apnea on CPAP, diabetes mellitus (non-insulin-dependent), degenerative joint disease of bilateral knees, and some occasional lower extremity edema. History of achalasia of the esophagus  He denies any  chest pain, chest pressure, chest tightness, source of breath, orthopnea, paroxysmal nocturnal dyspnea. He does report some dyspnea on exertion. He denies any regurgitation. He denies any heartburn. He denies any pain or trouble swallowing liquids or solids. No abdominal pain. No melena or hematochezia. Is worsening knee pain. No TIAs or amaurosis fugax. No tobacco or drug use. Very infrequent alcohol use  I reviewed the note from Dr. Damita Dunnings from April 5. His last hemoglobin A1c was on March 11 and it was 10.8 which is the highest it is been. Also reviewed the note from his cardiologist from May 2021. He was planning on doing a Lexiscan Myoview to evaluate his cardiac risk for potential bariatric surgery. He has a history of a pacemaker as well. I reviewed his perfusion study which was negative. No high risk findings. Patient was found to be an acceptable risk for bariatric surgery per cardiology.  I reviewed a upper GI from May 2017 which showed a tortuous redundant esophagus without leak after esophageal dilatation. 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 Damita Dunnings for evaluation of weight loss surgery. He completed our seminar in person. His gastroenterologist is Dr Silverio Decamp. His cardiologist is Dr. Caryl Comes. He is interested in a gastric bypass. Despite numerous attempts for sustained weight loss he has been unsuccessful. He has tried [employer], [employer], and [employer]risystem on several occasions-all without any long-term success  His comorbidities include dyslipidemia, obstructive sleep apnea on CPAP, diabetes mellitus (non-insulin-dependent), degenerative joint disease of bilateral knees, and some occasional lower extremity edema  He  denies any chest pain, chest pressure, source of breath, orthopnea, paroxysmal nocturnal dyspnea, TIAs, chest tightness or amaurosis fugax. He  denies any personal or family history of blood clots. He had an echocardiogram about a year ago that showed grade 2 diastolic dysfunction. He typically notices some swelling in his lower legs at the end of the work day. He has had a few episodes of cellulitis of his lower legs but no ulcers. He does not wear formal compression stockings. He uses CPAP. He states that he has a history of achalasia and this required dilation of his esophagus. The last time was 2 years ago. Prior to that dilation he was having severe intolerance to soft foods and liquids. He had an upper endoscopy in May 2017 which showed a tight GE junction that was dilated. He states that he is still fairly much asymptomatic. He denies any regurgitation. He has a daily bowel movement. He denies any prior, surgery. He denies any melena or hematochezia. He does have some issues with urinary control. He denies any dysuria or hematuria. He has chronic bilateral knee pain. Left greater than right. He states that when his carbohydrate intake increases it'll actually help minimize his knee discomfort. He has diabetes mellitus for about 2 years. His last A1c which was yesterday was 9.2. He had some steroid injections a few weeks ago for knee pain as well as the increased carbohydrate intake.  He denies any severe headaches. He denies any tobacco, drug use. He drinks alcohol on a very rare occasion. He works in Engineer, mining for [employer]  He had a lipid panel in March which showed triglyceride level of 266, HDL level of 32, total cholesterol level 172. Normal comprehensive metabolic panel except for an elevated blood glucose of 180.   Problem List/Past Medical Randall Hiss M. Redmond Pulling, MD; 03/03/2020 12:29 PM) PACEMAKER (Z95.0) OSA ON CPAP (G47.33) HYPERTRIGLYCERIDEMIA (E78.1) ESOPHAGEAL DYSPHAGIA (R13.19) ACHALASIA (K22.0) SEVERE OBESITY (E66.01) The patient meets weight loss surgery criteria. I believe the patient is a better  candidate for gastric bypass given his history of esophageal issues. he had previously been given his [facility] bariatric surgical risk-benefits calculator. We have previously rediscussed the risk of death, any complication, serious complication, leak, bleeding, surgical site infection, reoperation the admission and intervention along with likelihood of improvement of comorbidities and likely weight at 1 year out from surgery  This patient encounter took 5mnutes today to perform the following: take history, perform exam, review outside records, interpret imaging, counsel the patient on their diagnosis and document encounter, findings & plan in the EHR DIABETES MELLITUS TYPE 2 IN OBESE (E11.69)  Past Surgical History (Randall HissM. WRedmond Pulling MD; 03/03/2020 12:29 PM) No pertinent past surgical history  Diagnostic Studies History (Randall HissM. WRedmond Pulling MD; 03/03/2020 12:29 PM) Colonoscopy 1-5 years ago  Allergies (Tanisha A. BOwens Shark [place] 03/03/2020 8:31 AM) Morphine Derivatives Allergies Reconciled  Medication History (Tanisha A. [place]wens [place]ark [place] 03/03/2020 8:31 AM) Glucotrol (5MG Tablet, Oral) Active. Aspirin (81MG Tablet Chewable, Oral) Active. OneTouch Delica Lancets 359DActive. OneTouch Verio (In Vitro) Active. Torsemide (20MG Tablet, Oral) Active. Citalopram Hydrobromide (20MG Tablet, Oral) Active. Atorvastatin Calcium (10MG Tablet, Oral) Active. glipiZIDE (5MG Tablet, Oral) Active. Basaglar KwikPen (100UNIT/ML Soln Pen-inj, Subcutaneous) Active. Medications Reconciled  Social History (Randall HissM. WRedmond Pulling MD; 03/03/2020 12:29 PM) Alcohol use Occasional alcohol use. Caffeine use Carbonated beverages, Coffee. No drug use Tobacco use Former smoker.  Family History (Randall HissM. WRedmond Pulling MD; 03/03/2020 12:29 PM) Arthritis Mother. Cancer Father. Depression Mother. Hypertension Mother. Prostate  Cancer Father.  Other Problems Randall Hiss M. Redmond Pulling, MD; 03/03/2020 12:29 PM) Anxiety Disorder Bladder  Problems Depression Diabetes Mellitus Gastroesophageal Reflux Disease Hemorrhoids Sleep Apnea ARTHRITIS OF BOTH KNEES (M17.0)    Vitals (Tanisha A. Brown RMA; 03/03/2020 8:31 AM) 03/03/2020 8:31 AM Weight: 398.6 lb Height: 71in Body Surface Area: 2.83 m Body Mass Index: 55.59 kg/m  Temp.: 97.10F  Pulse: 80 (Regular)  BP: 134/86(Sitting, Left Arm, Standard)        Physical Exam Randall Hiss M. Gretel Cantu MD; 03/03/2020 9:52 AM)  General Mental Status-Alert. General Appearance-Consistent with stated age. Hydration-Well hydrated. Voice-Normal. Note: severe obesity; truncal  Integumentary Note: perhaps some trace edema in b/l LE. no cellulitis.  Head and Neck Head-normocephalic, atraumatic with no lesions or palpable masses. Trachea-midline. Thyroid Gland Characteristics - normal size and consistency.  Eye Eyeball - Bilateral-Extraocular movements intact. Sclera/Conjunctiva - Bilateral-No scleral icterus.  ENMT Ears -Note:normal external ears.  Mouth and Throat -Note:lips intact.   Chest and Lung Exam Chest and lung exam reveals -quiet, even and easy respiratory effort with no use of accessory muscles and on auscultation, normal breath sounds, no adventitious sounds and normal vocal resonance. Inspection Chest Wall - Normal. Back - normal.  Breast - Did not examine.  Cardiovascular Cardiovascular examination reveals -normal heart sounds, regular rate and rhythm with no murmurs and normal pedal pulses bilaterally.  Abdomen Inspection Inspection of the abdomen reveals - No Hernias. Skin - Scar - no surgical scars. Palpation/Percussion Palpation and Percussion of the abdomen reveal - Soft, Non Tender, No Rebound tenderness, No Rigidity (guarding) and No hepatosplenomegaly. Auscultation Auscultation of the abdomen reveals - Bowel sounds normal.  Peripheral Vascular Upper Extremity Palpation - Pulses bilaterally  normal.  Neurologic Neurologic evaluation reveals -alert and oriented x 3 with no impairment of recent or remote memory. Mental Status-Normal.  Neuropsychiatric The patient's mood and affect are described as -normal. Judgment and Insight-insight is appropriate concerning matters relevant to self.  Musculoskeletal Normal Exam - Left-Upper Extremity Strength Normal and Lower Extremity Strength Normal. Normal Exam - Right-Upper Extremity Strength Normal and Lower Extremity Strength Normal. Note: b/l knee crepitus  Lymphatic Head & Neck  General Head & Neck Lymphatics: Bilateral - Description - Normal. Axillary - Did not examine. Femoral & Inguinal - Did not examine.    Assessment & Plan Randall Hiss M. Siera Beyersdorf MD; 03/03/2020 12:29 PM)  SEVERE OBESITY (E66.01) Story: The patient meets weight loss surgery criteria. I believe the patient is a better candidate for gastric bypass given his history of esophageal issues. he had previously been given his [facility] bariatric surgical risk-benefits calculator. We have previously rediscussed the risk of death, any complication, serious complication, leak, bleeding, surgical site infection, reoperation the admission and intervention along with likelihood of improvement of comorbidities and likely weight at 1 year out from surgery  This patient encounter took 21mnutes today to perform the following: take history, perform exam, review outside records, interpret imaging, counsel the patient on their diagnosis and document encounter, findings & plan in the EHR Impression: The patient meets weight loss surgery criteria. I think the patient would be an acceptable candidate for Laparoscopic Roux-en-Y Gastric bypass.  We rediscussed laparoscopic Roux-en-Y gastric bypass. We have discussed weight loss surgery on several occasions. He declined going into detail again since we have had an extensive discussion on prior occasions. We discussed the importance  of the preoperative meal plan. We discussed the typical issues that we see after surgery. We discussed strategies for long-term success. All of his  questions were asked and answered  Current Plans Pt Education - EMW_preopbariatric  DIABETES MELLITUS TYPE 2 IN OBESE (E11.69) Impression: I congratulated him on the improvement of his diabetes control and stressed the importance of good blood sugar control   OSA ON CPAP (G47.33)   ESOPHAGEAL DYSPHAGIA (R13.19) Impression: Because of his history of esophageal issues and achalasia I do not think a sleeve gastrectomy is in his best interest. I would like to get an up-to-date upper GI just to make sure there is no evidence of a stricture that needs to be dilated prior to gastric bypass. we reviewed this on the phone prior to today's visit   HYPERTRIGLYCERIDEMIA (E78.1) Impression: Because of his severe obesity, hypertriglyceridemia diabetes mellitus we will ask for cardiac evaluation prior to surgical intervention.   PACEMAKER (Z95.0)   ARTHRITIS OF BOTH KNEES (M17.0)   ACHALASIA (K22.0) Impression: His repeat upper GI showed findings consistent with his known achalasia. He is not vomiting. He is tolerating solids and liquids. I told him that I discussed his achalasia with not only my colleagues in the practice but also an international online discussion group with HIPAA safeguards. They felt that gastric bypass was also saved in this situation given the patient's known achalasia. We did discuss that bariatric surgery would not resolve his achalasia and that he may ultimately need invasive procedures for his achalasia in that Roux-en-Y surgery may limit 1 potential surgical option. He voiced understanding and wishes to proceed with bariatric surgery  Leighton Ruff. Redmond Pulling, MD, FACS General, Bariatric, & Minimally Invasive Surgery [facility], [place]

## 2020-03-08 ENCOUNTER — Other Ambulatory Visit: Payer: Self-pay

## 2020-03-08 ENCOUNTER — Ambulatory Visit: Payer: BC Managed Care – PPO | Admitting: Family Medicine

## 2020-03-08 ENCOUNTER — Encounter: Payer: BC Managed Care – PPO | Attending: General Surgery | Admitting: Skilled Nursing Facility1

## 2020-03-08 NOTE — Progress Notes (Signed)
Pre-Operative Nutrition Class:  Appt start time: 7460   End time:  1830.  Patient was seen on 03/08/2020 for Pre-Operative Bariatric Surgery Education at the [facility].    Surgery date: 04/05/2020 Surgery type: RYGB Start weight at [facility]: 399.7 Weight today: 388  Samples given per MNT protocol. Patient educated on appropriate usage: BariatricopurityMultivitamin Lot [id number] Exp:05/2021  [place]  Lot #[id number] Exp:03/21/2020   Protein20Shake Lot #[id number] Exp:04/10/21  The following the learning objectives were met by the patient during this course:  Identify Pre-Op Dietary Goals and will begin 2 weeks pre-operatively  Identify appropriate sources of fluids and proteins   State protein recommendations and appropriate sources pre and post-operatively  Identify Post-Operative Dietary Goals and will follow for 2 weeks post-operatively  Identify appropriate multivitamin and calcium sources  Describe the need for physical activity post-operatively and will follow MD recommendations  State when to call healthcare provider regarding medication questions or post-operative complications  Handouts given during class include:  Pre-Op Bariatric Surgery Diet Handout  Protein Shake Handout  Post-Op Bariatric Surgery Nutrition Handout  BELT Program Information Flyer  Support Group Information Flyer  [facility] Bariatric Supplements Price List  Follow-Up Plan: Patient will follow-up at [facility] 2 weeks post operatively for diet advancement per MD.

## 2020-03-15 ENCOUNTER — Ambulatory Visit: Payer: BC Managed Care – PPO | Admitting: Family Medicine

## 2020-03-22 ENCOUNTER — Encounter: Payer: Self-pay | Admitting: Family Medicine

## 2020-03-23 ENCOUNTER — Ambulatory Visit (INDEPENDENT_AMBULATORY_CARE_PROVIDER_SITE_OTHER): Payer: BC Managed Care – PPO | Admitting: Psychology

## 2020-03-23 DIAGNOSIS — F509 Eating disorder, unspecified: Secondary | ICD-10-CM | POA: Diagnosis not present

## 2020-03-29 ENCOUNTER — Ambulatory Visit: Payer: BC Managed Care – PPO

## 2020-03-30 ENCOUNTER — Other Ambulatory Visit: Payer: Self-pay | Admitting: Family Medicine

## 2020-03-30 ENCOUNTER — Encounter: Payer: Self-pay | Admitting: Internal Medicine

## 2020-03-30 NOTE — Progress Notes (Signed)
PERIOPERATIVE PRESCRIPTION FOR IMPLANTED CARDIAC DEVICE PROGRAMMING  Patient Information: Name:  Glenn Lyons  DOB:  11/02/1955  MRN:  [id number]    Planned Procedure: Roux en Y gastric bypass  Surgeon: Dr. Greer Pickerel  Date of Procedure: 04/05/20  Cautery will be used.  Position during surgery:    Please send documentation back to:  [facility] (Fax # [phone])   Glenn Drilling, RN  03/30/2020 9:33 AM   Device Information:  Clinic EP Physician:  Virl Axe, MD   Device Type:  Pacemaker Manufacturer and Phone #:  St. Jude/Abbott: [phone] Pacemaker Dependent?:  No. Date of Last Device Check: 07/24/2019 in-clinic  03/31/20 Remote Normal Device Function?:  Yes.     Electrophysiologist's Recommendations:    Have magnet available.  Provide continuous ECG monitoring when magnet is used or reprogramming is to be performed.   Procedure may interfere with device function.  Magnet should be placed over device during procedure.  Per Device Clinic Standing Orders, [name], RN  4:49 PM 03/30/2020

## 2020-03-30 NOTE — Patient Instructions (Addendum)
DUE TO COVID-19 ONLY ONE VISITOR IS ALLOWED TO COME WITH YOU AND STAY IN THE WAITING ROOM ONLY DURING PRE OP AND PROCEDURE DAY OF SURGERY. THE 1 VISITOR  MAY VISIT WITH YOU AFTER SURGERY IN YOUR PRIVATE ROOM DURING VISITING HOURS ONLY!  YOU NEED TO HAVE A COVID 19 TEST ON__11/4_____ @__11 :00_____, THIS TEST MUST BE DONE BEFORE SURGERY,  COVID TESTING SITE [place] [address], IT IS ON THE RIGHT GOING OUT [address] APPROXIMATELY  2 MINUTES PAST [employer] ON THE RIGHT. ONCE YOUR COVID TEST IS COMPLETED,  PLEASE BEGIN THE QUARANTINE INSTRUCTIONS AS OUTLINED IN YOUR HANDOUT.                Glenn Lyons    Your procedure is scheduled on: 04/05/20   Report to [facility] Main  Entrance   Report to admitting at  8:00 AM     Call this number if you have problems the morning of surgery [phone]      . BRUSH YOUR TEETH MORNING OF SURGERY AND RINSE YOUR MOUTH OUT, NO CHEWING GUM CANDY OR MINTS.  NO SOLID FOOD AFTER 6:00 PM THE NIGHT BEFORE YOUR SURGERY.   YOU MAY DRINK CLEAR FLUIDS until 7:00 AM   THE G2 DRINK YOU DRINK BEFORE YOU LEAVE HOME WILL BE THE LAST FLUIDS YOU [place].  PAIN IS EXPECTED AFTER SURGERY AND WILL NOT BE COMPLETELY ELIMINATED.   AMBULATION AND TYLENOL WILL HELP REDUCE INCISIONAL AND GAS PAIN. MOVEMENT IS KEY!  YOU ARE EXPECTED TO BE OUT OF BED WITHIN 4 HOURS OF ADMISSION TO YOUR PATIENT ROOM.  SITTING IN THE RECLINER THROUGHOUT THE DAY IS IMPORTANT FOR DRINKING FLUIDS AND MOVING GAS THROUGHOUT THE GI TRACT.  COMPRESSION STOCKINGS SHOULD BE WORN [place] UNLESS YOU ARE WALKING.   INCENTIVE SPIROMETER SHOULD BE USED EVERY HOUR WHILE AWAKE TO DECREASE POST-OPERATIVE COMPLICATIONS SUCH AS PNEUMONIA.  WHEN DISCHARGED HOME, IT IS IMPORTANT TO CONTINUE TO WALK EVERY HOUR AND USE THE INCENTIVE SPIROMETER EVERY HOUR.     Take these medicines the morning of surgery with A SIP OF WATER:  Citalopram       How to Manage Your Diabetes Before and After Surgery  Why is it important to control my blood sugar before and after surgery? . Improving blood sugar levels before and after surgery helps healing and can limit problems. . A way of improving blood sugar control is eating a healthy diet by: o  Eating less sugar and carbohydrates o  Increasing activity/exercise o  Talking with your doctor about reaching your blood sugar goals . High blood sugars (greater than 180 mg/dL) can raise your risk of infections and slow your recovery, so you will need to focus on controlling your diabetes during the weeks before surgery. . Make sure that the doctor who takes care of your diabetes knows about your planned surgery including the date and location.  How do I manage my blood sugar before surgery? . Check your blood sugar at least 4 times a day, starting 2 days before surgery, to make sure that the level is not too high or low. o Check your blood sugar the morning of your surgery when you wake up and every 2 hours until you get to the Short Stay unit. . If your blood sugar is less than 70 mg/dL, you will need to treat for low blood sugar: o Do not take insulin. o Treat a low  blood sugar (less than 70 mg/dL) with  cup of clear juice (cranberry or apple), 4 glucose tablets, OR glucose gel. o Recheck blood sugar in 15 minutes after treatment (to make sure it is greater than 70 mg/dL). If your blood sugar is not greater than 70 mg/dL on recheck, call [phone] for further instructions. . Report your blood sugar to the short stay nurse when you get to Short Stay.  . If you are admitted to the hospital after surgery: o Your blood sugar will be checked by the staff and you will probably be given insulin after surgery (instead of oral diabetes medicines) to make sure you have good blood sugar levels. o The goal for blood sugar control after surgery is 80-180 mg/dL.   WHAT DO I DO ABOUT MY  DIABETES MEDICATION?  Marland Kitchen Do not take oral diabetes medicines (pills) the morning of surgery.  . THE NIGHT BEFORE SURGERY, take 40   units of  Glagine     insulin.       . THE MORNING OF SURGERY, take 0   units of insulin.                          You may not have any metal on your body including              piercings  Do not wear jewelry,  lotions, powders or deodorant                        Men may shave face and neck.   Do not bring valuables to the hospital. [employer].  Contacts, dentures or bridgework may not be worn into surgery.     Special Instructions: N/A              Please read over the following fact sheets you were given: _____________________________________________________________________             [facility] - Preparing for Surgery Before surgery, you can play an important role.  Because skin is not sterile, your skin needs to be as free of germs as possible.  You can reduce the number of germs on your skin by washing with CHG (chlorahexidine gluconate) soap before surgery.  CHG is an antiseptic cleaner which kills germs and bonds with the skin to continue killing germs even after washing. Please DO NOT use if you have an allergy to CHG or antibacterial soaps.  If your skin becomes reddened/irritated stop using the CHG and inform your nurse when you arrive at Short Stay. Do not shave (including legs and underarms) for at least 48 hours prior to the first CHG shower.  You may shave your face/neck. Please follow these instructions carefully:  1.  Shower with CHG Soap the night before surgery and the  morning of Surgery.  2.  If you choose to wash your hair, wash your hair first as usual with your  normal  shampoo.  3.  After you shampoo, rinse your hair and body thoroughly to remove the  shampoo.                                        4.  Use CHG as you would any other  liquid soap.  You can apply chg directly  to  the skin and wash                       Gently with a scrungie or clean washcloth.  5.  Apply the CHG Soap to your body ONLY FROM THE NECK DOWN.   Do not use on face/ open                           Wound or open sores. Avoid contact with eyes, ears mouth and genitals (private parts).                       Wash face,  Genitals (private parts) with your normal soap.             6.  Wash thoroughly, paying special attention to the area where your surgery  will be performed.  7.  Thoroughly rinse your body with warm water from the neck down.  8.  DO NOT shower/wash with your normal soap after using and rinsing off  the CHG Soap.             9.  Pat yourself dry with a clean towel.            10.  Wear clean pajamas.            11.  Place clean sheets on your bed the night of your first shower and do not  sleep with pets. Day of Surgery : Do not apply any lotions/deodorants the morning of surgery.  Please wear clean clothes to the hospital/surgery center.  FAILURE TO FOLLOW THESE INSTRUCTIONS MAY RESULT IN THE CANCELLATION OF YOUR SURGERY PATIENT SIGNATURE_________________________________  NURSE SIGNATURE__________________________________  ________________________________________________________________________   Adam Phenix  An incentive spirometer is a tool that can help keep your lungs clear and active. This tool measures how well you are filling your lungs with each breath. Taking long deep breaths may help reverse or decrease the chance of developing breathing (pulmonary) problems (especially infection) following:  A long period of time when you are unable to move or be active. BEFORE THE PROCEDURE   If the spirometer includes an indicator to show your best effort, your nurse or respiratory therapist will set it to a desired goal.  If possible, sit up straight or lean slightly forward. Try not to slouch.  Hold the incentive spirometer in an upright position. INSTRUCTIONS  FOR USE  1. Sit on the edge of your bed if possible, or sit up as far as you can in bed or on a chair. 2. Hold the incentive spirometer in an upright position. 3. Breathe out normally. 4. Place the mouthpiece in your mouth and seal your lips tightly around it. 5. Breathe in slowly and as deeply as possible, raising the piston or the ball toward the top of the column. 6. Hold your breath for 3-5 seconds or for as long as possible. Allow the piston or ball to fall to the bottom of the column. 7. Remove the mouthpiece from your mouth and breathe out normally. 8. Rest for a few seconds and repeat Steps 1 through 7 at least 10 times every 1-2 hours when you are awake. Take your time and take a few normal breaths between deep breaths. 9. The spirometer may include an indicator to show your best effort. Use the indicator as a goal to work  toward during each repetition. 10. After each set of 10 deep breaths, practice coughing to be sure your lungs are clear. If you have an incision (the cut made at the time of surgery), support your incision when coughing by placing a pillow or rolled up towels firmly against it. Once you are able to get out of bed, walk around indoors and cough well. You may stop using the incentive spirometer when instructed by your caregiver.  RISKS AND COMPLICATIONS  Take your time so you do not get dizzy or light-headed.  If you are in pain, you may need to take or ask for pain medication before doing incentive spirometry. It is harder to take a deep breath if you are having pain. AFTER USE  Rest and breathe slowly and easily.  It can be helpful to keep track of a log of your progress. Your caregiver can provide you with a simple table to help with this. If you are using the spirometer at home, follow these instructions: [place] IF:   You are having difficultly using the spirometer.  You have trouble using the spirometer as often as instructed.  Your pain  medication is not giving enough relief while using the spirometer.  You develop fever of 100.5 F (38.1 C) or higher. SEEK IMMEDIATE MEDICAL CARE IF:   You cough up bloody sputum that had not been present before.  You develop fever of 102 F (38.9 C) or greater.  You develop worsening pain at or near the incision site. MAKE SURE YOU:   Understand these instructions.  Will watch your condition.  Will get help right away if you are not doing well or get worse. Document Released: 09/25/2006 Document Revised: 08/07/2011 Document Reviewed: 11/26/2006 [facility] Patient Information 2014 [place], [place].   ________________________________________________________________________

## 2020-04-01 ENCOUNTER — Other Ambulatory Visit: Payer: Self-pay

## 2020-04-01 ENCOUNTER — Encounter (HOSPITAL_COMMUNITY)
Admission: RE | Admit: 2020-04-01 | Discharge: 2020-04-01 | Disposition: A | Discharge status: Home / Self Care | Payer: BC Managed Care – PPO | Source: Ambulatory Visit | Attending: General Surgery | Admitting: General Surgery

## 2020-04-01 ENCOUNTER — Other Ambulatory Visit (HOSPITAL_COMMUNITY)
Admission: RE | Admit: 2020-04-01 | Discharge: 2020-04-01 | Disposition: A | Discharge status: Home / Self Care | Payer: BC Managed Care – PPO | Source: Ambulatory Visit | Attending: General Surgery | Admitting: General Surgery

## 2020-04-01 DIAGNOSIS — G4733 Obstructive sleep apnea (adult) (pediatric): Secondary | ICD-10-CM | POA: Diagnosis not present

## 2020-04-01 DIAGNOSIS — E119 Type 2 diabetes mellitus without complications: Secondary | ICD-10-CM | POA: Insufficient documentation

## 2020-04-01 DIAGNOSIS — Z79899 Other long term (current) drug therapy: Secondary | ICD-10-CM | POA: Insufficient documentation

## 2020-04-01 DIAGNOSIS — Z950 Presence of cardiac pacemaker: Secondary | ICD-10-CM | POA: Diagnosis not present

## 2020-04-01 DIAGNOSIS — Z20822 Contact with and (suspected) exposure to covid-19: Secondary | ICD-10-CM | POA: Insufficient documentation

## 2020-04-01 DIAGNOSIS — Z01812 Encounter for preprocedural laboratory examination: Secondary | ICD-10-CM | POA: Insufficient documentation

## 2020-04-01 DIAGNOSIS — Z6841 Body Mass Index (BMI) 40.0 and over, adult: Secondary | ICD-10-CM | POA: Insufficient documentation

## 2020-04-01 DIAGNOSIS — Z791 Long term (current) use of non-steroidal anti-inflammatories (NSAID): Secondary | ICD-10-CM | POA: Insufficient documentation

## 2020-04-01 DIAGNOSIS — I495 Sick sinus syndrome: Secondary | ICD-10-CM | POA: Insufficient documentation

## 2020-04-01 DIAGNOSIS — Z7984 Long term (current) use of oral hypoglycemic drugs: Secondary | ICD-10-CM | POA: Diagnosis not present

## 2020-04-01 LAB — CBC WITH DIFFERENTIAL/PLATELET
Abs Immature Granulocytes: 0.07 10*3/uL (ref 0.00–0.07)
Basophils Absolute: 0.1 10*3/uL (ref 0.0–0.1)
Basophils Relative: 1 %
Eosinophils Absolute: 0.3 10*3/uL (ref 0.0–0.5)
Eosinophils Relative: 3 %
HCT: 42.9 % (ref 39.0–52.0)
Hemoglobin: 14.2 g/dL (ref 13.0–17.0)
Immature Granulocytes: 1 %
Lymphocytes Relative: 12 %
Lymphs Abs: 1.2 10*3/uL (ref 0.7–4.0)
MCH: 30.6 pg (ref 26.0–34.0)
MCHC: 33.1 g/dL (ref 30.0–36.0)
MCV: 92.5 fL (ref 80.0–100.0)
Monocytes Absolute: 0.6 10*3/uL (ref 0.1–1.0)
Monocytes Relative: 6 %
Neutro Abs: 8.3 10*3/uL — ABNORMAL HIGH (ref 1.7–7.7)
Neutrophils Relative %: 77 %
Platelets: 243 10*3/uL (ref 150–400)
RBC: 4.64 MIL/uL (ref 4.22–5.81)
RDW: 13.4 % (ref 11.5–15.5)
WBC: 10.6 10*3/uL — ABNORMAL HIGH (ref 4.0–10.5)
nRBC: 0 % (ref 0.0–0.2)

## 2020-04-01 LAB — COMPREHENSIVE METABOLIC PANEL
ALT: 32 U/L (ref 0–44)
AST: 22 U/L (ref 15–41)
Albumin: 4 g/dL (ref 3.5–5.0)
Alkaline Phosphatase: 72 U/L (ref 38–126)
Anion gap: 8 (ref 5–15)
BUN: 27 mg/dL — ABNORMAL HIGH (ref 8–23)
CO2: 25 mmol/L (ref 22–32)
Calcium: 9.8 mg/dL (ref 8.9–10.3)
Chloride: 101 mmol/L (ref 98–111)
Creatinine, Ser: 0.7 mg/dL (ref 0.61–1.24)
GFR, Estimated: >60 mL/min (ref >60)
Glucose, Bld: 143 mg/dL — ABNORMAL HIGH (ref 70–99)
Potassium: 4.4 mmol/L (ref 3.5–5.1)
Sodium: 134 mmol/L — ABNORMAL LOW (ref 135–145)
Total Bilirubin: 0.9 mg/dL (ref 0.3–1.2)
Total Protein: 7 g/dL (ref 6.5–8.1)

## 2020-04-01 LAB — HEMOGLOBIN A1C
Hgb A1c MFr Bld: 8.5 % — ABNORMAL HIGH (ref 4.8–5.6)
Mean Plasma Glucose: 197.25 mg/dL

## 2020-04-01 LAB — GLUCOSE, CAPILLARY: Glucose-Capillary: 145 mg/dL — ABNORMAL HIGH (ref 70–99)

## 2020-04-01 LAB — SARS CORONAVIRUS 2 (TAT 6-24 HRS): SARS Coronavirus 2: NEGATIVE

## 2020-04-01 NOTE — Progress Notes (Signed)
COVID Vaccine Completed:Yes Date COVID Vaccine completed:08/30/19- Booster 03/25/20 COVID vaccine manufacturer: [place]     PCP - Dr. Orlinda Blalock Cardiologist - no  Chest x-ray - no EKG - 10/16/19-Epic Stress Test - 11/12/19-Epic ECHO - 2018 Cardiac Cath - no Pacemaker/ICD device last checked:04/01/20  Sleep Study - yes CPAP - yes  Fasting Blood Sugar - 3 times a week 160-210 Checks Blood Sugar _____ times a day  Blood Thinner Instructions:NA Aspirin Instructions: Last Dose:  Anesthesia review:   Patient denies shortness of breath, fever, cough and chest pain at PAT appointment yes   Patient verbalized understanding of instructions that were given to them at the PAT appointment. Patient was also instructed that they will need to review over the PAT instructions again at home before surgery. Yes Pt has difficulty climbing stairs with bad knees and Wt. He does get SOB but none while working or with ADLs

## 2020-04-02 NOTE — Progress Notes (Signed)
Looks like patient has sent that transmission 03/31/2020

## 2020-04-02 NOTE — Progress Notes (Signed)
Anesthesia Chart Review   Case: [id number] Date/Time: 04/05/20 0945   Procedure: LAPAROSCOPIC ROUX-EN-Y GASTRIC BYPASS WITH UPPER ENDOSCOPY (N/A )   Anesthesia type: General   Pre-op diagnosis: MORBID OBESITY   Location: [place] [facility] / [facility]   Surgeons: Greer Pickerel, MD      DISCUSSION:64 y.o. former smoker (20 pack years, quit 05/29/04) with h/o OSA, DM (non insulin depended), sinus node dysfunction with PPM (Device orders in progress note 03/30/20), morbid obesity scheduled for above procedure 04/05/2020 with Dr. Greer Pickerel.   Low risk stress test 11/13/2019.   Per cardiology preoperative risk assessment 01/19/2020, "Chart reviewed and patient contacted today by phone as part of pre-operative protocol coverage. Given past medical history and time since last visit, based on ACC/AHA guidelines, Glenn Lyons would be at acceptable risk for the planned procedure without further cardiovascular testing.  OK to hold aspirin 5-7 days pre op if needed."  Anticipate pt can proceed with planned procedure barring acute status change.   VS: BP (!) 164/72   Pulse 65   Temp 36.7 C (Oral)   Resp 18   Ht _0  (1.803 m)   Wt (!) 173.7 kg   SpO2 98%   BMI 53.42 kg/m   PROVIDERS: Tonia Ghent, MD is PCP   Virl Axe, MD is EP  Minus Breeding, MD is Cardiologist  LABS: Labs reviewed: Acceptable for surgery. (all labs ordered are listed, but only abnormal results are displayed)  Labs Reviewed  GLUCOSE, CAPILLARY - Abnormal; Notable for the following components:      Result Value   Glucose-Capillary 145 (*)    All other components within normal limits  HEMOGLOBIN A1C - Abnormal; Notable for the following components:   Hgb A1c MFr Bld 8.5 (*)    All other components within normal limits  CBC WITH DIFFERENTIAL/PLATELET - Abnormal; Notable for the following components:   WBC 10.6 (*)    Neutro Abs 8.3 (*)    All other components within normal limits  COMPREHENSIVE METABOLIC PANEL -  Abnormal; Notable for the following components:   Sodium 134 (*)    Glucose, Bld 143 (*)    BUN 27 (*)    All other components within normal limits  TYPE AND SCREEN     IMAGES:   EKG: 10/16/2019 Rate 67 bpm NSR LAFB  CV: Myocardial Perfusion 11/13/2019  The left ventricular ejection fraction is normal (55-65%).  Nuclear stress EF: 63%.  There was no ST segment deviation noted during stress.  The study is normal.  This is a low risk study.  Echo 03/13/2017 tudy Conclusions   - Left ventricle: The cavity size was normal. Wall thickness was  increased in a pattern of moderate LVH. Systolic function was  normal. The estimated ejection fraction was in the range of 60%  to 65%. Features are consistent with a pseudonormal left  ventricular filling pattern, with concomitant abnormal relaxation  and increased filling pressure (grade 2 diastolic dysfunction).  - Right atrium: The atrium was mildly dilated.   Impressions:   - Extremely technically difficult echo .  Past Medical History:  Diagnosis Date  . Achalasia    with prev eval at [facility]  No intervention as of 2012  . Aspiration pneumonia ([place]) 10/05/2014  . Backache, unspecified   . Cardiac pacemaker St. Jude    ERI 2008  . Depressive disorder, not elsewhere classified   . Diabetes ([place])   . Esophageal reflux   . Morbid obesity (  HCC)   . Obstructive sleep apnea    biapap settign 25-22  . Presence of permanent cardiac pacemaker    St. Jude- Dr. Caryl Comes follows -device Check 07-06-14  . Sinus bradycardia /pauses   . Stricture and stenosis of esophagus     Past Surgical History:  Procedure Laterality Date  . BALLOON DILATION N/A 10/06/2015   Procedure: BALLOON DILATION;  Surgeon: Mauri Pole, MD;  Location: [facility];  Service: Endoscopy;  Laterality: N/A;  . BIOPSY  05/06/2018   Procedure: BIOPSY;  Surgeon: Rush Landmark Telford Nab., MD;  Location: [place];  Service: Gastroenterology;;  .  COLONOSCOPY W/ POLYPECTOMY  11/01/2010  . COLONOSCOPY WITH PROPOFOL N/A 10/05/2014   Procedure: COLONOSCOPY WITH PROPOFOL;  Surgeon: Gatha Mayer, MD;  [facility];  Service: Endoscopy;  Laterality: N/A;  . COLONOSCOPY WITH PROPOFOL N/A 05/06/2018   Procedure: COLONOSCOPY WITH PROPOFOL;  Surgeon: Rush Landmark Telford Nab., MD;  Location: [facility];  Service: Gastroenterology;  Laterality: N/A;  . ESOPHAGEAL DILATION    . ESOPHAGEAL MANOMETRY N/A 08/23/2015   Procedure: ESOPHAGEAL MANOMETRY (EM);  Surgeon: Mauri Pole, MD;  [facility];  Service: Endoscopy;  Laterality: N/A;  . ESOPHAGOGASTRODUODENOSCOPY N/A 10/09/2014   Procedure: ESOPHAGOGASTRODUODENOSCOPY (EGD);  Surgeon: Gatha Mayer, MD;  Location: [facility];  Service: Endoscopy;  Laterality: N/A;  . ESOPHAGOGASTRODUODENOSCOPY (EGD) WITH PROPOFOL N/A 08/23/2015   Procedure: ESOPHAGOGASTRODUODENOSCOPY (EGD) WITH PROPOFOL;  Surgeon: Mauri Pole, MD;  [facility];  Service: Endoscopy;  Laterality: N/A;  . ESOPHAGOGASTRODUODENOSCOPY (EGD) WITH PROPOFOL N/A 10/06/2015   Procedure: ESOPHAGOGASTRODUODENOSCOPY (EGD) WITH PROPOFOL;  Surgeon: Mauri Pole, MD;  Location: [facility];  Service: Endoscopy;  Laterality: N/A;  Rigiflex ballon size 34m-35mm size 45 minute proc,need Fluro Gastografin esophagram 2 hrs post EGD   . PACEMAKER INSERTION    . POLYPECTOMY  05/06/2018   Procedure: POLYPECTOMY;  Surgeon: Mansouraty, GTelford Nab, MD;  [facility]  Service: Gastroenterology;;  . PBetsy PriesGENERATOR CHANGEOUT N/A 05/01/2018   Procedure: PPM GENERATOR CHANGEOUT;  Surgeon: KDeboraha Sprang MD;  [facility];  Service: Cardiovascular;  Laterality: N/A;    MEDICATIONS: . atorvastatin (LIPITOR) 10 MG tablet  . BD PEN NEEDLE NANO 2ND GEN 32G X 4 MM MISC  . Blood Glucose Monitoring Suppl (ONETOUCH VERIO) w/Device KIT  . cephALEXin (KEFLEX) 500 MG capsule  . cetirizine (ZYRTEC)  10 MG tablet  . citalopram (CELEXA) 20 MG tablet  . glipiZIDE (GLUCOTROL) 5 MG tablet  . Insulin Glargine (BASAGLAR KWIKPEN) 100 UNIT/ML  . Lancets (ONETOUCH ULTRASOFT) lancets  . lisinopril (ZESTRIL) 2.5 MG tablet  . meloxicam (MOBIC) 7.5 MG tablet  . Multiple Vitamin (MULTIVITAMIN WITH MINERALS) TABS tablet  . ONETOUCH VERIO test strip  . torsemide (DEMADEX) 20 MG tablet   No current facility-administered medications for this encounter.    JKonrad Felix PA-C [facility] Pre-Surgical Testing [phone]

## 2020-04-04 MED ORDER — BUPIVACAINE LIPOSOME 1.3 % IJ SUSP
20.0000 mL | Freq: Once | INTRAMUSCULAR | Status: DC
  Filled 2020-04-04: qty 20

## 2020-04-05 ENCOUNTER — Inpatient Hospital Stay (HOSPITAL_COMMUNITY): Payer: BC Managed Care – PPO | Admitting: Physician Assistant

## 2020-04-05 ENCOUNTER — Encounter (HOSPITAL_COMMUNITY)
Admission: RE | Disposition: A | Discharge status: Home / Self Care | Payer: Self-pay | Source: Home / Self Care | Attending: General Surgery

## 2020-04-05 ENCOUNTER — Inpatient Hospital Stay (HOSPITAL_COMMUNITY)
Admission: RE | Admit: 2020-04-05 | Discharge: 2020-04-06 | DRG: 621 | Disposition: A | Discharge status: Home / Self Care | Payer: BC Managed Care – PPO | Attending: General Surgery | Admitting: General Surgery

## 2020-04-05 ENCOUNTER — Inpatient Hospital Stay (HOSPITAL_COMMUNITY): Payer: BC Managed Care – PPO | Admitting: Registered Nurse

## 2020-04-05 ENCOUNTER — Other Ambulatory Visit: Payer: Self-pay

## 2020-04-05 ENCOUNTER — Encounter (HOSPITAL_COMMUNITY): Payer: Self-pay | Admitting: General Surgery

## 2020-04-05 DIAGNOSIS — G4733 Obstructive sleep apnea (adult) (pediatric): Secondary | ICD-10-CM | POA: Diagnosis present

## 2020-04-05 DIAGNOSIS — K219 Gastro-esophageal reflux disease without esophagitis: Secondary | ICD-10-CM | POA: Diagnosis present

## 2020-04-05 DIAGNOSIS — F419 Anxiety disorder, unspecified: Secondary | ICD-10-CM | POA: Diagnosis present

## 2020-04-05 DIAGNOSIS — I10 Essential (primary) hypertension: Secondary | ICD-10-CM | POA: Diagnosis present

## 2020-04-05 DIAGNOSIS — Z8249 Family history of ischemic heart disease and other diseases of the circulatory system: Secondary | ICD-10-CM | POA: Diagnosis not present

## 2020-04-05 DIAGNOSIS — E785 Hyperlipidemia, unspecified: Secondary | ICD-10-CM | POA: Diagnosis present

## 2020-04-05 DIAGNOSIS — Z87891 Personal history of nicotine dependence: Secondary | ICD-10-CM | POA: Diagnosis not present

## 2020-04-05 DIAGNOSIS — E1140 Type 2 diabetes mellitus with diabetic neuropathy, unspecified: Secondary | ICD-10-CM | POA: Diagnosis present

## 2020-04-05 DIAGNOSIS — M179 Osteoarthritis of knee, unspecified: Secondary | ICD-10-CM | POA: Diagnosis present

## 2020-04-05 DIAGNOSIS — Z807 Family history of other malignant neoplasms of lymphoid, hematopoietic and related tissues: Secondary | ICD-10-CM | POA: Diagnosis not present

## 2020-04-05 DIAGNOSIS — M170 Bilateral primary osteoarthritis of knee: Secondary | ICD-10-CM | POA: Diagnosis present

## 2020-04-05 DIAGNOSIS — Z950 Presence of cardiac pacemaker: Secondary | ICD-10-CM | POA: Diagnosis not present

## 2020-04-05 DIAGNOSIS — K220 Achalasia of cardia: Secondary | ICD-10-CM | POA: Diagnosis present

## 2020-04-05 DIAGNOSIS — M1710 Unilateral primary osteoarthritis, unspecified knee: Secondary | ICD-10-CM | POA: Diagnosis present

## 2020-04-05 DIAGNOSIS — Z6841 Body Mass Index (BMI) 40.0 and over, adult: Secondary | ICD-10-CM | POA: Diagnosis not present

## 2020-04-05 DIAGNOSIS — F32A Depression, unspecified: Secondary | ICD-10-CM | POA: Diagnosis present

## 2020-04-05 DIAGNOSIS — R1314 Dysphagia, pharyngoesophageal phase: Secondary | ICD-10-CM | POA: Diagnosis present

## 2020-04-05 DIAGNOSIS — Z885 Allergy status to narcotic agent status: Secondary | ICD-10-CM

## 2020-04-05 DIAGNOSIS — Z888 Allergy status to other drugs, medicaments and biological substances status: Secondary | ICD-10-CM

## 2020-04-05 DIAGNOSIS — E781 Pure hyperglyceridemia: Secondary | ICD-10-CM | POA: Diagnosis present

## 2020-04-05 DIAGNOSIS — Z9884 Bariatric surgery status: Secondary | ICD-10-CM

## 2020-04-05 HISTORY — PX: GASTRIC ROUX-EN-Y: SHX5262

## 2020-04-05 LAB — TYPE AND SCREEN
ABO/RH(D): A POS
Antibody Screen: NEGATIVE

## 2020-04-05 LAB — GLUCOSE, CAPILLARY
Glucose-Capillary: 135 mg/dL — ABNORMAL HIGH (ref 70–99)
Glucose-Capillary: 195 mg/dL — ABNORMAL HIGH (ref 70–99)
Glucose-Capillary: 185 mg/dL — ABNORMAL HIGH (ref 70–99)
Glucose-Capillary: 178 mg/dL — ABNORMAL HIGH (ref 70–99)

## 2020-04-05 LAB — ABO/RH: ABO/RH(D): A POS

## 2020-04-05 LAB — HEMOGLOBIN AND HEMATOCRIT, BLOOD
HCT: 45.9 % (ref 39.0–52.0)
Hemoglobin: 14.8 g/dL (ref 13.0–17.0)

## 2020-04-05 SURGERY — LAPAROSCOPIC ROUX-EN-Y GASTRIC BYPASS WITH UPPER ENDOSCOPY
Anesthesia: General | Site: Abdomen

## 2020-04-05 MED ORDER — OXYCODONE HCL 5 MG PO TABS: 5.0000 mg | ORAL_TABLET | Freq: Once | ORAL | Status: DC | PRN

## 2020-04-05 MED ORDER — FENTANYL CITRATE (PF) 100 MCG/2ML IJ SOLN
INTRAMUSCULAR | Status: AC
  Filled 2020-04-05: qty 2

## 2020-04-05 MED ORDER — ENOXAPARIN (LOVENOX) PATIENT EDUCATION KIT
PACK | Freq: Once | Status: AC
  Filled 2020-04-05: qty 1

## 2020-04-05 MED ORDER — SODIUM CHLORIDE (PF) 0.9 % IJ SOLN
INTRAMUSCULAR | Status: AC
  Filled 2020-04-05: qty 50

## 2020-04-05 MED ORDER — FIBRIN SEALANT 2 ML SINGLE DOSE KIT
2.0000 mL | PACK | Freq: Once | CUTANEOUS | Status: AC
  Administered 2020-04-05: 2 mL via TOPICAL
  Filled 2020-04-05: qty 2

## 2020-04-05 MED ORDER — FENTANYL CITRATE (PF) 100 MCG/2ML IJ SOLN
25.0000 ug | INTRAMUSCULAR | Status: DC | PRN
  Administered 2020-04-05 (×3): 50 ug via INTRAVENOUS

## 2020-04-05 MED ORDER — "VISTASEAL 4 ML SINGLE DOSE KIT "
PACK | CUTANEOUS | Status: DC | PRN
  Administered 2020-04-05: 4 mL via TOPICAL

## 2020-04-05 MED ORDER — ACETAMINOPHEN 160 MG/5ML PO SOLN
1000.0000 mg | Freq: Three times a day (TID) | ORAL | Status: DC
  Administered 2020-04-05 – 2020-04-06 (×2): 1000 mg via ORAL
  Filled 2020-04-05 (×2): qty 40.6

## 2020-04-05 MED ORDER — HEPARIN SODIUM (PORCINE) 5000 UNIT/ML IJ SOLN
5000.0000 [IU] | INTRAMUSCULAR | Status: AC
  Administered 2020-04-05: 5000 [IU] via SUBCUTANEOUS
  Filled 2020-04-05: qty 1

## 2020-04-05 MED ORDER — CHLORHEXIDINE GLUCONATE 4 % EX LIQD: 60.0000 mL | Freq: Once | CUTANEOUS | Status: DC

## 2020-04-05 MED ORDER — ORAL CARE MOUTH RINSE: 15.0000 mL | Freq: Once | OROMUCOSAL | Status: AC

## 2020-04-05 MED ORDER — ONDANSETRON HCL 4 MG/2ML IJ SOLN
INTRAMUSCULAR | Status: DC | PRN
  Administered 2020-04-05: 4 mg via INTRAVENOUS

## 2020-04-05 MED ORDER — ROCURONIUM BROMIDE 10 MG/ML (PF) SYRINGE
PREFILLED_SYRINGE | INTRAVENOUS | Status: DC | PRN
  Administered 2020-04-05: 60 mg via INTRAVENOUS
  Administered 2020-04-05 (×3): 10 mg via INTRAVENOUS

## 2020-04-05 MED ORDER — SUGAMMADEX SODIUM 500 MG/5ML IV SOLN
INTRAVENOUS | Status: AC
  Filled 2020-04-05: qty 5

## 2020-04-05 MED ORDER — LISINOPRIL 5 MG PO TABS
2.5000 mg | ORAL_TABLET | Freq: Every day | ORAL | Status: DC
  Administered 2020-04-06: 2.5 mg via ORAL
  Filled 2020-04-05: qty 1

## 2020-04-05 MED ORDER — AMISULPRIDE (ANTIEMETIC) 5 MG/2ML IV SOLN: 10.0000 mg | Freq: Once | INTRAVENOUS | Status: DC | PRN

## 2020-04-05 MED ORDER — LIDOCAINE 2% (20 MG/ML) 5 ML SYRINGE
INTRAMUSCULAR | Status: DC | PRN
  Administered 2020-04-05: 1.5 mg/kg/h via INTRAVENOUS
  Administered 2020-04-05: 60 mg via INTRAVENOUS

## 2020-04-05 MED ORDER — PANTOPRAZOLE SODIUM 40 MG IV SOLR
40.0000 mg | Freq: Every day | INTRAVENOUS | Status: DC
  Administered 2020-04-05: 40 mg via INTRAVENOUS
  Filled 2020-04-05: qty 40

## 2020-04-05 MED ORDER — APREPITANT 40 MG PO CAPS
40.0000 mg | ORAL_CAPSULE | ORAL | Status: AC
  Administered 2020-04-05: 40 mg via ORAL
  Filled 2020-04-05: qty 1

## 2020-04-05 MED ORDER — DEXAMETHASONE SODIUM PHOSPHATE 10 MG/ML IJ SOLN
INTRAMUSCULAR | Status: AC
  Filled 2020-04-05: qty 1

## 2020-04-05 MED ORDER — CHLORHEXIDINE GLUCONATE 0.12 % MT SOLN
15.0000 mL | Freq: Once | OROMUCOSAL | Status: AC
  Administered 2020-04-05: 15 mL via OROMUCOSAL

## 2020-04-05 MED ORDER — SCOPOLAMINE 1 MG/3DAYS TD PT72
1.0000 | MEDICATED_PATCH | TRANSDERMAL | Status: DC
  Administered 2020-04-05: 1.5 mg via TRANSDERMAL
  Filled 2020-04-05: qty 1

## 2020-04-05 MED ORDER — ENOXAPARIN SODIUM 30 MG/0.3ML ~~LOC~~ SOLN
30.0000 mg | Freq: Two times a day (BID) | SUBCUTANEOUS | Status: DC
  Administered 2020-04-05 – 2020-04-06 (×2): 30 mg via SUBCUTANEOUS
  Filled 2020-04-05 (×2): qty 0.3

## 2020-04-05 MED ORDER — PROMETHAZINE HCL 25 MG/ML IJ SOLN: 12.5000 mg | Freq: Four times a day (QID) | INTRAMUSCULAR | Status: DC | PRN

## 2020-04-05 MED ORDER — FENTANYL CITRATE (PF) 100 MCG/2ML IJ SOLN: 25.0000 ug | INTRAMUSCULAR | Status: DC | PRN

## 2020-04-05 MED ORDER — SIMETHICONE 80 MG PO CHEW: 80.0000 mg | CHEWABLE_TABLET | Freq: Four times a day (QID) | ORAL | Status: DC | PRN

## 2020-04-05 MED ORDER — OXYCODONE HCL 5 MG/5ML PO SOLN: 5.0000 mg | Freq: Once | ORAL | Status: DC | PRN

## 2020-04-05 MED ORDER — PROPOFOL 10 MG/ML IV BOLUS
INTRAVENOUS | Status: AC
  Filled 2020-04-05: qty 20

## 2020-04-05 MED ORDER — POTASSIUM CHLORIDE IN NACL 20-0.45 MEQ/L-% IV SOLN
INTRAVENOUS | Status: DC
  Filled 2020-04-05 (×3): qty 1000

## 2020-04-05 MED ORDER — ONDANSETRON HCL 4 MG/2ML IJ SOLN: 4.0000 mg | Freq: Once | INTRAMUSCULAR | Status: DC | PRN

## 2020-04-05 MED ORDER — ENSURE MAX PROTEIN PO LIQD
2.0000 [oz_av] | ORAL | Status: DC
  Administered 2020-04-06 (×3): 2 [oz_av] via ORAL

## 2020-04-05 MED ORDER — ROCURONIUM BROMIDE 10 MG/ML (PF) SYRINGE
PREFILLED_SYRINGE | INTRAVENOUS | Status: AC
  Filled 2020-04-05: qty 10

## 2020-04-05 MED ORDER — DEXAMETHASONE SODIUM PHOSPHATE 10 MG/ML IJ SOLN
INTRAMUSCULAR | Status: DC | PRN
  Administered 2020-04-05: 8 mg via INTRAVENOUS

## 2020-04-05 MED ORDER — ONDANSETRON HCL 4 MG/2ML IJ SOLN: 4.0000 mg | Freq: Four times a day (QID) | INTRAMUSCULAR | Status: DC | PRN

## 2020-04-05 MED ORDER — INSULIN ASPART 100 UNIT/ML ~~LOC~~ SOLN
0.0000 [IU] | SUBCUTANEOUS | Status: DC
  Administered 2020-04-05 (×2): 4 [IU] via SUBCUTANEOUS
  Administered 2020-04-06: 3 [IU] via SUBCUTANEOUS
  Administered 2020-04-06 (×3): 4 [IU] via SUBCUTANEOUS

## 2020-04-05 MED ORDER — SODIUM CHLORIDE 0.9 % IV SOLN
2.0000 g | INTRAVENOUS | Status: AC
  Administered 2020-04-05: 2 g via INTRAVENOUS
  Filled 2020-04-05: qty 2

## 2020-04-05 MED ORDER — GABAPENTIN 300 MG PO CAPS
300.0000 mg | ORAL_CAPSULE | ORAL | Status: AC
  Administered 2020-04-05: 300 mg via ORAL
  Filled 2020-04-05: qty 1

## 2020-04-05 MED ORDER — LACTATED RINGERS IV SOLN: INTRAVENOUS | Status: DC

## 2020-04-05 MED ORDER — GABAPENTIN 100 MG PO CAPS
200.0000 mg | ORAL_CAPSULE | Freq: Two times a day (BID) | ORAL | Status: DC
  Administered 2020-04-05 – 2020-04-06 (×2): 200 mg via ORAL
  Filled 2020-04-05 (×2): qty 2

## 2020-04-05 MED ORDER — LIDOCAINE HCL 2 % IJ SOLN
INTRAMUSCULAR | Status: AC
  Filled 2020-04-05: qty 20

## 2020-04-05 MED ORDER — "VISTASEAL 4 ML SINGLE DOSE KIT "
4.0000 mL | PACK | Freq: Once | CUTANEOUS | Status: DC
  Filled 2020-04-05: qty 4

## 2020-04-05 MED ORDER — PHENYLEPHRINE 40 MCG/ML (10ML) SYRINGE FOR IV PUSH (FOR BLOOD PRESSURE SUPPORT)
PREFILLED_SYRINGE | INTRAVENOUS | Status: DC | PRN
  Administered 2020-04-05 (×3): 80 ug via INTRAVENOUS

## 2020-04-05 MED ORDER — SUCCINYLCHOLINE CHLORIDE 200 MG/10ML IV SOSY
PREFILLED_SYRINGE | INTRAVENOUS | Status: DC | PRN
  Administered 2020-04-05: 120 mg via INTRAVENOUS

## 2020-04-05 MED ORDER — DEXAMETHASONE SODIUM PHOSPHATE 4 MG/ML IJ SOLN: 4.0000 mg | INTRAMUSCULAR | Status: DC

## 2020-04-05 MED ORDER — LIDOCAINE 2% (20 MG/ML) 5 ML SYRINGE
INTRAMUSCULAR | Status: AC
  Filled 2020-04-05: qty 5

## 2020-04-05 MED ORDER — SODIUM CHLORIDE (PF) 0.9 % IJ SOLN
INTRAMUSCULAR | Status: DC | PRN
  Administered 2020-04-05: 50 mL

## 2020-04-05 MED ORDER — SUCCINYLCHOLINE CHLORIDE 200 MG/10ML IV SOSY
PREFILLED_SYRINGE | INTRAVENOUS | Status: AC
  Filled 2020-04-05: qty 10

## 2020-04-05 MED ORDER — ONDANSETRON HCL 4 MG/2ML IJ SOLN
INTRAMUSCULAR | Status: AC
  Filled 2020-04-05: qty 2

## 2020-04-05 MED ORDER — LACTATED RINGERS IR SOLN
Status: DC | PRN
  Administered 2020-04-05: 1000 mL

## 2020-04-05 MED ORDER — ACETAMINOPHEN 500 MG PO TABS
1000.0000 mg | ORAL_TABLET | Freq: Three times a day (TID) | ORAL | Status: DC
  Administered 2020-04-05 – 2020-04-06 (×2): 1000 mg via ORAL
  Filled 2020-04-05 (×2): qty 2

## 2020-04-05 MED ORDER — KETAMINE HCL 10 MG/ML IJ SOLN
INTRAMUSCULAR | Status: DC | PRN
  Administered 2020-04-05 (×2): 20 mg via INTRAVENOUS

## 2020-04-05 MED ORDER — PROPOFOL 10 MG/ML IV BOLUS
INTRAVENOUS | Status: DC | PRN
  Administered 2020-04-05: 200 mg via INTRAVENOUS

## 2020-04-05 MED ORDER — MIDAZOLAM HCL 5 MG/5ML IJ SOLN
INTRAMUSCULAR | Status: DC | PRN
  Administered 2020-04-05: 2 mg via INTRAVENOUS

## 2020-04-05 MED ORDER — SUGAMMADEX SODIUM 200 MG/2ML IV SOLN
INTRAVENOUS | Status: DC | PRN
  Administered 2020-04-05: 500 mg via INTRAVENOUS

## 2020-04-05 MED ORDER — MIDAZOLAM HCL 2 MG/2ML IJ SOLN
INTRAMUSCULAR | Status: AC
  Filled 2020-04-05: qty 2

## 2020-04-05 MED ORDER — OXYCODONE HCL 5 MG/5ML PO SOLN
5.0000 mg | Freq: Four times a day (QID) | ORAL | Status: DC | PRN
  Administered 2020-04-05: 5 mg via ORAL
  Filled 2020-04-05: qty 5

## 2020-04-05 MED ORDER — BUPIVACAINE LIPOSOME 1.3 % IJ SUSP
INTRAMUSCULAR | Status: DC | PRN
  Administered 2020-04-05: 20 mL

## 2020-04-05 MED ORDER — FENTANYL CITRATE (PF) 100 MCG/2ML IJ SOLN
INTRAMUSCULAR | Status: DC | PRN
  Administered 2020-04-05: 50 ug via INTRAVENOUS
  Administered 2020-04-05: 25 ug via INTRAVENOUS
  Administered 2020-04-05 (×2): 50 ug via INTRAVENOUS
  Administered 2020-04-05: 25 ug via INTRAVENOUS

## 2020-04-05 MED ORDER — HYDRALAZINE HCL 20 MG/ML IJ SOLN: 10.0000 mg | INTRAMUSCULAR | Status: DC | PRN

## 2020-04-05 MED ORDER — ACETAMINOPHEN 500 MG PO TABS
1000.0000 mg | ORAL_TABLET | ORAL | Status: AC
  Administered 2020-04-05: 1000 mg via ORAL
  Filled 2020-04-05: qty 2

## 2020-04-05 SURGICAL SUPPLY — 67 items
APPLICATOR COTTON TIP 6 STRL (MISCELLANEOUS) IMPLANT
APPLICATOR COTTON TIP 6IN STRL (MISCELLANEOUS)
APPLICATOR VISTASEAL 35 (MISCELLANEOUS) ×4 IMPLANT
APPLIER CLIP ROT 13.4 12 LRG (CLIP)
BENZOIN TINCTURE PRP APPL 2/3 (GAUZE/BANDAGES/DRESSINGS) ×2 IMPLANT
BLADE SURG SZ11 CARB STEEL (BLADE) ×2 IMPLANT
BNDG ADH 1X3 SHEER STRL LF (GAUZE/BANDAGES/DRESSINGS) ×2 IMPLANT
CABLE HIGH FREQUENCY MONO STRZ (ELECTRODE) IMPLANT
CHLORAPREP W/TINT 26 (MISCELLANEOUS) ×4 IMPLANT
CLIP APPLIE ROT 13.4 12 LRG (CLIP) IMPLANT
CLIP SUT LAPRA TY ABSORB (SUTURE) ×4 IMPLANT
COVER WAND RF STERILE (DRAPES) IMPLANT
CUTTER FLEX LINEAR 45M (STAPLE) IMPLANT
DEVICE SUT QUICK LOAD TK 5 (STAPLE) IMPLANT
DEVICE SUT TI-KNOT TK 5X26 (MISCELLANEOUS) IMPLANT
DEVICE SUTURE ENDOST 10MM (ENDOMECHANICALS) ×2 IMPLANT
DRAIN PENROSE 0.25X18 (DRAIN) ×2 IMPLANT
ELECT REM PT RETURN 15FT ADLT (MISCELLANEOUS) ×2 IMPLANT
GAUZE 4X4 16PLY RFD (DISPOSABLE) ×2 IMPLANT
GAUZE SPONGE 4X4 12PLY STRL (GAUZE/BANDAGES/DRESSINGS) IMPLANT
GLOVE BIO SURGEON STRL SZ7.5 (GLOVE) IMPLANT
GLOVE INDICATOR 8.0 STRL GRN (GLOVE) ×2 IMPLANT
GOWN STRL REUS W/TWL XL LVL3 (GOWN DISPOSABLE) ×8 IMPLANT
KIT BASIN OR (CUSTOM PROCEDURE TRAY) ×2 IMPLANT
KIT GASTRIC LAVAGE 34FR ADT (SET/KITS/TRAYS/PACK) ×2 IMPLANT
KIT TURNOVER KIT A (KITS) IMPLANT
MARKER SKIN DUAL TIP RULER LAB (MISCELLANEOUS) ×2 IMPLANT
MAT PREVALON FULL STRYKER (MISCELLANEOUS) ×2 IMPLANT
NEEDLE SPNL 22GX3.5 QUINCKE BK (NEEDLE) ×2 IMPLANT
PACK CARDIOVASCULAR III (CUSTOM PROCEDURE TRAY) ×2 IMPLANT
PENCIL SMOKE EVACUATOR (MISCELLANEOUS) IMPLANT
RELOAD 45 VASCULAR/THIN (ENDOMECHANICALS) IMPLANT
RELOAD ENDO STITCH 2.0 (ENDOMECHANICALS) ×10
RELOAD STAPLE TA45 3.5 REG BLU (ENDOMECHANICALS) IMPLANT
RELOAD STAPLER BLUE 60MM (STAPLE) ×5 IMPLANT
RELOAD STAPLER GOLD 60MM (STAPLE) ×1 IMPLANT
RELOAD STAPLER WHITE 60MM (STAPLE) ×2 IMPLANT
SCISSORS LAP 5X45 EPIX DISP (ENDOMECHANICALS) ×2 IMPLANT
SET IRRIG TUBING LAPAROSCOPIC (IRRIGATION / IRRIGATOR) ×2 IMPLANT
SET TUBE SMOKE EVAC HIGH FLOW (TUBING) ×2 IMPLANT
SHEARS HARMONIC ACE PLUS 45CM (MISCELLANEOUS) ×2 IMPLANT
SLEEVE XCEL OPT CAN 5 100 (ENDOMECHANICALS) ×2 IMPLANT
SOL ANTI FOG 6CC (MISCELLANEOUS) ×1 IMPLANT
SOLUTION ANTI FOG 6CC (MISCELLANEOUS) ×1
STAPLER ECHELON BIOABSB 60 FLE (MISCELLANEOUS) IMPLANT
STAPLER ECHELON LONG 60 440 (INSTRUMENTS) ×2 IMPLANT
STAPLER RELOAD BLUE 60MM (STAPLE) ×10
STAPLER RELOAD GOLD 60MM (STAPLE) ×2
STAPLER RELOAD WHITE 60MM (STAPLE) ×4
STRIP CLOSURE SKIN 1/2X4 (GAUZE/BANDAGES/DRESSINGS) ×2 IMPLANT
SURGILUBE 2OZ TUBE FLIPTOP (MISCELLANEOUS) ×2 IMPLANT
SUT MNCRL AB 4-0 PS2 18 (SUTURE) ×2 IMPLANT
SUT RELOAD ENDO STITCH 2 48X1 (ENDOMECHANICALS) ×6
SUT RELOAD ENDO STITCH 2.0 (ENDOMECHANICALS) ×4
SUT SURGIDAC NAB ES-9 0 48 120 (SUTURE) IMPLANT
SUT VIC AB 2-0 SH 27 (SUTURE) ×1
SUT VIC AB 2-0 SH 27X BRD (SUTURE) ×1 IMPLANT
SUTURE RELOAD END STTCH 2 48X1 (ENDOMECHANICALS) ×6 IMPLANT
SUTURE RELOAD ENDO STITCH 2.0 (ENDOMECHANICALS) ×4 IMPLANT
SYR 20ML LL LF (SYRINGE) ×4 IMPLANT
TOWEL OR 17X26 10 PK STRL BLUE (TOWEL DISPOSABLE) ×2 IMPLANT
TOWEL OR NON WOVEN STRL DISP B (DISPOSABLE) ×2 IMPLANT
TRAY FOLEY MTR SLVR 16FR STAT (SET/KITS/TRAYS/PACK) IMPLANT
TROCAR BLADELESS OPT 5 100 (ENDOMECHANICALS) ×2 IMPLANT
TROCAR UNIVERSAL OPT 12M 100M (ENDOMECHANICALS) ×6 IMPLANT
TROCAR XCEL 12X100 BLDLESS (ENDOMECHANICALS) ×2 IMPLANT
TUBING CONNECTING 10 (TUBING) ×4 IMPLANT

## 2020-04-05 NOTE — Anesthesia Procedure Notes (Signed)
Procedure Name: Intubation Date/Time: 04/05/2020 10:03 AM Performed by: Victoriano Lain, CRNA Pre-anesthesia Checklist: Patient identified, Emergency Drugs available, Suction available, Patient being monitored and Timeout performed Patient Re-evaluated:Patient Re-evaluated prior to induction Oxygen Delivery Method: Circle system utilized Preoxygenation: Pre-oxygenation with 100% oxygen Induction Type: Rapid sequence, Cricoid Pressure applied and IV induction Laryngoscope Size: Mac and 4 Grade View: Grade II Tube type: Oral Tube size: 7.5 mm Number of attempts: 1 Airway Equipment and Method: Stylet Placement Confirmation: ETT inserted through vocal cords under direct vision,  positive ETCO2 and breath sounds checked- equal and bilateral Secured at: 22 cm Tube secured with: Tape Dental Injury: Teeth and Oropharynx as per pre-operative assessment  Comments: RSI with cricoid pressure by Dr Roger Kill. Grade 2 view. ATOI

## 2020-04-05 NOTE — H&P (Signed)
CC: here for surgery  Requesting provider: n/a  HPI: Glenn Lyons is an 64 y.o. male who is here for lap roux en y gastric bypass, upper endo. No changes since seen in clinic 10/8- other than planned weight loss. Down about 20lbs & a1c improved.   The patient is a 64 year old male who presents for a bariatric surgery evaluation. He comes in for long-term follow-up today regarding his severe obesity. I last saw him in July. His blood sugars have improved. His A1c went from 10.8 down to 9.2. His physician increased his insulin. He states that other than that the only medical change has been worsening knee pain and a little bit more difficulty ambulating first thing in the morning. He had an injection in his knees last week. He denies any chest pain or chest pressure shortness of breath. He denies any vomiting. He denies any regurgitation. He denies any heartburn. Occasionally he will have a sticky sensation with solids and occasional liquids but is nothing like it was 5 years ago prior to his dilatation. He has achalasia.  Past Medical History:  Diagnosis Date  . Achalasia    with prev eval at [facility].  No intervention as of 2012  . Aspiration pneumonia ([place]) 10/05/2014  . Backache, unspecified   . Cardiac pacemaker St. Jude    ERI 2008  . Depressive disorder, not elsewhere classified   . Diabetes ([place])   . Esophageal reflux   . Morbid obesity ([place])   . Obstructive sleep apnea    biapap settign 25-22  . Presence of permanent cardiac pacemaker    St. Jude- Dr. Caryl Comes follows -device Check 07-06-14  . Sinus bradycardia /pauses   . Stricture and stenosis of esophagus     Past Surgical History:  Procedure Laterality Date  . BALLOON DILATION N/A 10/06/2015   Procedure: BALLOON DILATION;  Surgeon: Mauri Pole, MD;  Location: [facility];  Service: Endoscopy;  Laterality: N/A;  . BIOPSY  05/06/2018   Procedure: BIOPSY;  Surgeon: Rush Landmark Telford Nab., MD;  Location: [facility];  Service: Gastroenterology;;  . COLONOSCOPY W/ POLYPECTOMY  11/01/2010  . COLONOSCOPY WITH PROPOFOL N/A 10/05/2014   Procedure: COLONOSCOPY WITH PROPOFOL;  Surgeon: Gatha Mayer, MD;  Location: [facility];  Service: Endoscopy;  Laterality: N/A;  . COLONOSCOPY WITH PROPOFOL N/A 05/06/2018   Procedure: COLONOSCOPY WITH PROPOFOL;  Surgeon: Rush Landmark Telford Nab., MD;  Location: [facility];  Service: Gastroenterology;  Laterality: N/A;  . ESOPHAGEAL DILATION    . ESOPHAGEAL MANOMETRY N/A 08/23/2015   Procedure: ESOPHAGEAL MANOMETRY (EM);  Surgeon: Mauri Pole, MD;  Location: [facility];  Service: Endoscopy;  Laterality: N/A;  . ESOPHAGOGASTRODUODENOSCOPY N/A 10/09/2014   Procedure: ESOPHAGOGASTRODUODENOSCOPY (EGD);  Surgeon: Gatha Mayer, MD;  Location: [facility];  Service: Endoscopy;  Laterality: N/A;  . ESOPHAGOGASTRODUODENOSCOPY (EGD) WITH PROPOFOL N/A 08/23/2015   Procedure: ESOPHAGOGASTRODUODENOSCOPY (EGD) WITH PROPOFOL;  Surgeon: Mauri Pole, MD;  Location: [facility];  Service: Endoscopy;  Laterality: N/A;  . ESOPHAGOGASTRODUODENOSCOPY (EGD) WITH PROPOFOL N/A 10/06/2015   Procedure: ESOPHAGOGASTRODUODENOSCOPY (EGD) WITH PROPOFOL;  Surgeon: Mauri Pole, MD;  Location: [facility];  Service: Endoscopy;  Laterality: N/A;  Rigiflex ballon size 54m-35mm size 45 minute proc,need Fluro Gastografin esophagram 2 hrs post EGD   . PACEMAKER INSERTION    . POLYPECTOMY  05/06/2018   Procedure: POLYPECTOMY;  Surgeon: Mansouraty, GTelford Nab, MD;  Location: [facility]  Service: Gastroenterology;;  . PBetsy PriesGENERATOR CHANGEOUT N/A 05/01/2018  Procedure: PPM GENERATOR CHANGEOUT;  Surgeon: Deboraha Sprang, MD;  Location: [facility];  Service: Cardiovascular;  Laterality: N/A;    Family History  Problem Relation Age of Onset  . Heart attack Father   . Heart disease Father   . Cancer Father        multiple myeloma  . Hypertension Father   . Prostate  cancer Father        possible dx  . Hypertension Mother   . Dementia Mother   . Colon cancer Neg Hx     Social:  reports that he quit smoking about 15 years ago. His smoking use included cigarettes. He has a 20.00 pack-year smoking history. He has never used smokeless tobacco. He reports current alcohol use. He reports that he does not use drugs.  Allergies:  Allergies  Allergen Reactions  . Morphine Other (See Comments)    Cardiac Arrest  . Metformin And Related     Intolerant of any dose due to aches    Medications: I have reviewed the patient's current medications.  Results for orders placed or performed during the hospital encounter of 04/05/20 (from the past 48 hour(s))  ABO/Rh     Status: None   Collection Time: 04/05/20  5:00 AM  Result Value Ref Range   ABO/RH(D)      A POS Performed at [facility] [address], [place], [place] [address]   Glucose, capillary     Status: Abnormal   Collection Time: 04/05/20  8:21 AM  Result Value Ref Range   Glucose-Capillary 135 (H) 70 - 99 mg/dL    Comment: Glucose reference range applies only to samples taken after fasting for at least 8 hours.   Comment 1 Notify RN    Comment 2 Document in Chart     No results found.  ROS - all of the below systems have been reviewed with the patient and positives are indicated with bold text General: chills, fever or night sweats Eyes: blurry vision or double vision ENT: epistaxis or sore throat Allergy/Immunology: itchy/watery eyes or nasal congestion Hematologic/Lymphatic: bleeding problems, blood clots or swollen lymph nodes Endocrine: temperature intolerance or unexpected weight changes Breast: new or changing breast lumps or nipple discharge Resp: cough, shortness of breath, or wheezing CV: chest pain or dyspnea on exertion GI: as per HPI GU: dysuria, trouble voiding, or hematuria MSK: joint pain or joint stiffness Neuro: TIA or stroke symptoms Derm:  pruritus and skin lesion changes Psych: anxiety and depression  PE Blood pressure (!) 165/79, pulse 85, temperature 98.6 F (37 C), temperature source Oral, resp. rate 20, height '5\' 11"'  (1.803 m), weight (!) 171.1 kg, SpO2 99 %. Constitutional: NAD; conversant; no deformities Eyes: Moist conjunctiva; no lid lag; anicteric; PERRL Neck: Trachea midline; no thyromegaly Lungs: Normal respiratory effort; no tactile fremitus CV: RRR; no palpable thrills; no pitting edema GI: Abd soft, obese; no palpable hepatosplenomegaly MSK: Normal gait; no clubbing/cyanosis Psychiatric: Appropriate affect; alert and oriented x3 Lymphatic: No palpable cervical or axillary lymphadenopathy Skin:no rash  Results for orders placed or performed during the hospital encounter of 04/05/20 (from the past 48 hour(s))  ABO/Rh     Status: None   Collection Time: 04/05/20  5:00 AM  Result Value Ref Range   ABO/RH(D)      A POS Performed at [facility], [place] [address], [place], [place] [address]   Glucose, capillary     Status: Abnormal  Collection Time: 04/05/20  8:21 AM  Result Value Ref Range   Glucose-Capillary 135 (H) 70 - 99 mg/dL    Comment: Glucose reference range applies only to samples taken after fasting for at least 8 hours.   Comment 1 Notify RN    Comment 2 Document in Chart     No results found.  Imaging: Reviewed ugi  A/P: TEYTON PATTILLO is an 64 y.o. male with   To OR for LRYGB, egd Eras questions  preop subcu heparin   SEVERE OBESITY (E66.01) Story: The patient meets weight loss surgery criteria. I believe the patient is a better candidate for gastric bypass given his history of esophageal issues. he had previously been given his [facility] bariatric surgical risk-benefits calculator. We have previously rediscussed the risk of death, any complication, serious complication, leak, bleeding, surgical site infection, reoperation the admission and intervention along with  likelihood of improvement of comorbidities and likely weight at 1 year out from surgery  This patient encounter took 57mnutes today to perform the following: take history, perform exam, review outside records, interpret imaging, counsel the patient on their diagnosis and document encounter, findings & plan in the EHR Impression: The patient meets weight loss surgery criteria. I think the patient would be an acceptable candidate for Laparoscopic Roux-en-Y Gastric bypass.  We rediscussed laparoscopic Roux-en-Y gastric bypass. We have discussed weight loss surgery on several occasions. He declined going into detail again since we have had an extensive discussion on prior occasions. We discussed the importance of the preoperative meal plan. We discussed the typical issues that we see after surgery. We discussed strategies for long-term success. All of his questions were asked and answered  Current Plans Pt Education - EMW_preopbariatric  DIABETES MELLITUS TYPE 2 IN OBESE (E11.69) Impression: I congratulated him on the improvement of his diabetes control and stressed the importance of good blood sugar control   OSA ON CPAP (G47.33)   ESOPHAGEAL DYSPHAGIA (R13.19) Impression: Because of his history of esophageal issues and achalasia I do not think a sleeve gastrectomy is in his best interest. I would like to get an up-to-date upper GI just to make sure there is no evidence of a stricture that needs to be dilated prior to gastric bypass. we reviewed this on the phone prior to today's visit   HYPERTRIGLYCERIDEMIA (E78.1) Impression: Because of his severe obesity, hypertriglyceridemia diabetes mellitus we will ask for cardiac evaluation prior to surgical intervention.   PACEMAKER (Z95.0)   ARTHRITIS OF BOTH KNEES (M17.0)   ACHALASIA (K22.0) Impression: His repeat upper GI showed findings consistent with his known achalasia. He is not vomiting. He is tolerating solids and  liquids. I told him that I discussed his achalasia with not only my colleagues in the practice but also an international online discussion group with HIPAA safeguards. They felt that gastric bypass was also saved in this situation given the patient's known achalasia. We did discuss that bariatric surgery would not resolve his achalasia and that he may ultimately need invasive procedures for his achalasia in that Roux-en-Y surgery may limit 1 potential surgical option. He voiced understanding and wishes to proceed with bariatric surgery   ELeighton Ruff WRedmond Pulling MD, FACS General, Bariatric, & Minimally Invasive Surgery [facility] [place]

## 2020-04-05 NOTE — Progress Notes (Signed)
Patient has started water.

## 2020-04-05 NOTE — Progress Notes (Signed)
Discussed post op day goals with patient including ambulation, IS, diet progression, pain, and nausea control.  BSTOP education provided including BSTOP information guide, "Guide for Pain Management after your Bariatric Procedure". Discussed discharge on Lovenox with patient, teaching kit ordered.  Questions answered.

## 2020-04-05 NOTE — Anesthesia Preprocedure Evaluation (Addendum)
Anesthesia Evaluation  Patient identified by MRN, date of birth, ID band Patient awake    Reviewed: Allergy & Precautions, NPO status , Patient's Chart, lab work & pertinent test results  History of Anesthesia Complications Negative for: history of anesthetic complications  Airway Mallampati: II  TM Distance: >3 FB Neck ROM: Full    Dental  (+) Teeth Intact   Pulmonary sleep apnea , former smoker,    Pulmonary exam normal        Cardiovascular Exercise Tolerance: Poor hypertension, Pt. on medications Normal cardiovascular exam+ pacemaker (sinus node dysfunction)      Neuro/Psych Anxiety Depression negative neurological ROS     GI/Hepatic Neg liver ROS, GERD  ,achalasia   Endo/Other  diabetes, Insulin DependentMorbid obesity  Renal/GU negative Renal ROS  negative genitourinary   Musculoskeletal  (+) Arthritis ,   Abdominal   Peds  Hematology negative hematology ROS (+)   Anesthesia Other Findings  CV: Myocardial Perfusion 11/13/2019  The left ventricular ejection fraction is normal (55-65%).  Nuclear stress EF: 63%.  There was no ST segment deviation noted during stress.  The study is normal.  This is a low risk study.  Echo 03/13/2017 Study Conclusions  - Left ventricle: The cavity size was normal. Wall thickness was  increased in a pattern of moderate LVH. Systolic function was  normal. The estimated ejection fraction was in the range of 60%  to 65%. Features are consistent with a pseudonormal left  ventricular filling pattern, with concomitant abnormal relaxation  and increased filling pressure (grade 2 diastolic dysfunction).  - Right atrium: The atrium was mildly dilated.  Impressions:  - Extremely technically difficult echo .   Reproductive/Obstetrics                         Anesthesia Physical Anesthesia Plan  ASA: III  Anesthesia Plan: General   Post-op  Pain Management:    Induction: Intravenous, Rapid sequence and Cricoid pressure planned  PONV Risk Score and Plan: 3 and Ondansetron, Dexamethasone, Treatment may vary due to age or medical condition and Midazolam  Airway Management Planned: Oral ETT  Additional Equipment: None  Intra-op Plan:   Post-operative Plan: Extubation in OR  Informed Consent: I have reviewed the patients History and Physical, chart, labs and discussed the procedure including the risks, benefits and alternatives for the proposed anesthesia with the patient or authorized representative who has indicated his/her understanding and acceptance.     Dental advisory given  Plan Discussed with:   Anesthesia Plan Comments:        Anesthesia Quick Evaluation

## 2020-04-05 NOTE — Progress Notes (Addendum)
PHARMACY CONSULT FOR:  Risk Assessment for Post-Discharge VTE Following Bariatric Surgery  Post-Discharge VTE Risk Assessment: This patient's probability of 30-day post-discharge VTE is increased due to the factors marked:  X Male  X  Age >/=60 years   X BMI >/=50 kg/m2    CHF    Dyspnea at Rest    Paraplegia  X  Non-gastric-band surgery    Operation Time >/=3 hr    Return to OR     Length of Stay >/= 3 d   Hx of VTE   Hypercoagulable condition   Significant venous stasis       Predicted probability of 30-day post-discharge VTE: 0.99%  Other patient-specific factors to consider: OR time right under 3 hours. Predicted probability of 30-day post-discharge VTE  borderline for needing 2 or 4 weeks of Enoxaparin post-discharge.   Recommendation for Discharge: Enoxaparin 60 mg SQ q12h x 2 to 4 weeks post-discharge per physician discretion        Glenn Lyons is a 64 y.o. male who underwent laparoscopic Roux-en-Y gastric bypass with upper endoscopy on 04/05/2020.   Case start: 1034 Case end: 1323   Allergies  Allergen Reactions  . Morphine Other (See Comments)    Cardiac Arrest  . Metformin And Related     Intolerant of any dose due to aches    Patient Measurements: Height: _0  (180.3 cm) Weight: (!) 171.1 kg (377 lb 3.2 oz) IBW/kg (Calculated) : 75.3 Body mass index is 52.61 kg/m.  No results for input(s): WBC, HGB, HCT, PLT, APTT, CREATININE, LABCREA, CREATININE, CREAT24HRUR, MG, PHOS, ALBUMIN, PROT, ALBUMIN, AST, ALT, ALKPHOS, BILITOT, BILIDIR, IBILI in the last 72 hours. Estimated Creatinine Clearance: 153.8 mL/min (by C-G formula based on SCr of 0.7 mg/dL).    Past Medical History:  Diagnosis Date  . Achalasia    with prev eval at [facility].  No intervention as of 2012  . Aspiration pneumonia ([place]) 10/05/2014  . Backache, unspecified   . Cardiac pacemaker St. Jude    ERI 2008  . Depressive disorder, not elsewhere classified   . Diabetes ([place])   .  Esophageal reflux   . Morbid obesity ([place])   . Obstructive sleep apnea    biapap settign 25-22  . Presence of permanent cardiac pacemaker    St. Jude- Dr. Caryl Comes follows -device Check 07-06-14  . Sinus bradycardia /pauses   . Stricture and stenosis of esophagus      Medications Prior to Admission  Medication Sig Dispense Refill Last Dose  . atorvastatin (LIPITOR) 10 MG tablet TAKE 1 TABLET BY MOUTH EVERY DAY (Patient taking differently: Take 10 mg by mouth daily at 6 PM. ) 90 tablet 3 03/31/2020  . cetirizine (ZYRTEC) 10 MG tablet Take 10 mg by mouth daily.    03/31/2020  . citalopram (CELEXA) 20 MG tablet Take 1 tablet (20 mg total) by mouth daily. 90 tablet 3 03/31/2020  . Insulin Glargine (BASAGLAR KWIKPEN) 100 UNIT/ML Inject 0.6-0.7 mLs (60-70 Units total) into the skin daily. (Patient taking differently: Inject 80 Units into the skin every evening. ) 30 mL 12 04/04/2020 at 1900  . lisinopril (ZESTRIL) 2.5 MG tablet Take 1 tablet (2.5 mg total) by mouth daily. 90 tablet 3 03/31/2020  . Multiple Vitamin (MULTIVITAMIN WITH MINERALS) TABS tablet Take 1 tablet by mouth daily.   03/31/2020  . torsemide (DEMADEX) 20 MG tablet Take 1 tablet (20 mg total) by mouth daily. (Patient taking differently: Take 20 mg by mouth every  evening. ) 30 tablet 2 03/31/2020  . BD PEN NEEDLE NANO 2ND GEN 32G X 4 MM MISC 1 DEVICE BY DOES NOT APPLY ROUTE DAILY. USE DAILY WITH INSULIN PEN INJECTION. 100 each 3 Unknown at Unknown time  . Blood Glucose Monitoring Suppl (ONETOUCH VERIO) w/Device KIT Inject 1 kit into the skin every morning. Use to test blood sugar each day before breakfast and 2 hours after a meal for 2 weeks.  Diagnosis:  E11.9  Non-insulin dependent. 1 kit 0 Unknown at Unknown time  . cephALEXin (KEFLEX) 500 MG capsule TAKE 1 CAPSULE BY MOUTH THREE TIMES A DAY (Patient not taking: Reported on 03/23/2020) 21 capsule 0 Not Taking at Unknown time  . glipiZIDE (GLUCOTROL) 5 MG tablet TAKE 1-2 TABLETS (5-10 MG  TOTAL) BY MOUTH DAILY BEFORE BREAKFAST. 180 tablet 0 03/31/2020  . Lancets (ONETOUCH ULTRASOFT) lancets USE TO TEST BLOOD SUGAR ONCE DAILY OR AS NEEDED 100 each 3   . meloxicam (MOBIC) 7.5 MG tablet Take 1 tablet (7.5 mg total) by mouth daily. With food. (Patient not taking: Reported on 03/23/2020) 30 tablet 2 Not Taking at Unknown time  . ONETOUCH VERIO test strip USE TO TEST BLOOD SUGAR ONCE DAILY OR AS INSTRUCTED 100 strip 3 Unknown at Unknown time       Luiz Ochoa 04/05/2020,1:58 PM

## 2020-04-05 NOTE — Op Note (Signed)
Glenn Lyons [id number] 28-Apr-1956 04/05/2020  Preoperative diagnosis: morbid obesity with esophageal motility disorder  Postoperative diagnosis: Same   Procedure: Upper endoscopy   Surgeon: Catalina Antigua B. Hassell Done  M.D., FACS   Anesthesia: Gen.   Indications for procedure: This patient was undergoing a roux en Y gastric bypass and we had difficulty passing the [place] tube.    Description of procedure: The endoscopy was placed in the mouth and into the oropharynx and under endoscopic vision it was advanced to the esophagogastric junction.  Initially this took some time in the distal esophagus.  I looked in to the pouch during pouch creation then I pulled the tube into the esophagus until the [place] was complete. The pouch was insufflated and no bubbles were seen.  I crossed the esophagus into the roux limb.   No bleeding or leaks were detected.  The scope was withdrawn without difficulty.     Matt B. Hassell Done, MD, FACS General, Bariatric, & Minimally Invasive Surgery [facility], [place]

## 2020-04-05 NOTE — Op Note (Signed)
TYRIC RODEHEAVER [id number] 1955/11/11. 04/05/2020  Preoperative diagnosis:   severe obesity with BMI of 53   GERD   OSA (obstructive sleep apnea)   PACEMAKER, [facility]   OA (osteoarthritis) of knee   Essential hypertension   Type 2 diabetes mellitus with diabetic neuropathy, unspecified (HCC)   HLD (hyperlipidemia)   Postoperative  diagnosis:  1. same  Surgical procedure: Laparoscopic Roux-en-Y gastric bypass (ante-colic, ante-gastric); upper endoscopy  Surgeon: Gayland Curry, M.D. FACS  Asst.: Johnathan Hausen MD FACS (primary assist), Alphonsa Overall MD FACS  Anesthesia: General plus exparel/marcaine mix  Complications: None   EBL: Minimal   Drains: None   Disposition: PACU in good condition   Indications for procedure: 64 y.o. yo male with morbid obesity who has been unsuccessful at sustained weight loss. The patient's comorbidities are listed above. We discussed the risk and benefits of surgery including but not limited to anesthesia risk, bleeding, infection, blood clot formation, anastomotic leak, anastomotic stricture, ulcer formation, death, respiratory complications, intestinal blockage, internal hernia, gallstone formation, vitamin and nutritional deficiencies, injury to surrounding structures, failure to lose weight and mood changes.   Description of procedure: Patient is brought to the operating room and general anesthesia induced. The patient had received preoperative broad-spectrum IV antibiotics and subcutaneous heparin. The abdomen was widely sterilely prepped with Chloraprep and draped. Patient timeout was performed and correct patient and procedure confirmed. Access was obtained with a 12 mm Optiview trocar in the left upper quadrant and pneumoperitoneum established without difficulty. Under direct vision 12 mm trocars were placed laterally in the right upper quadrant, right upper quadrant midclavicular line, and to the left and above the umbilicus for the camera port. A 5  mm trocar was placed laterally in the left upper quadrant.  Exparel/marcaine mix was infiltrated in bilateral lateral abdominal walls as a TAP block.  The omentum was brought into the upper abdomen and the transverse mesocolon elevated and the ligament of Treitz clearly identified. A 40 cm biliopancreatic limb was then carefully measured from the ligament of Treitz. The small intestine was divided at this point with a single firing of the white load linear stapler. A Penrose drain was sutured to the end of the Roux-en-Y limb for later identification. A 100 cm Roux-en-Y limb was then carefully measured. At this point a side-to-side anastomosis was created between the Roux limb and the end of the biliopancreatic limb. This was accomplished with a single firing of the 60 mm white load linear stapler. The common enterotomy was closed with a running 2-0 Vicryl begun at either end of the enterotomy and tied centrally. Eviceal tissue sealant was placed over the anastomosis. The mesenteric defect was then closed with running 2-0 silk. The omentum was then divided with the harmonic scalpel up towards the transverse colon to allow mobility of the Roux limb toward the gastric pouch. The patient was then placed in steep reversed Trendelenburg. Through a 5 mm subxiphoid site the [facility] retractor was placed and the left lobe of the liver elevated with excellent exposure of the upper stomach and hiatus. The angle of Hiss was then mobilized with the harmonic scalpel. A 6 cm gastric pouch was then carefully measured along the lesser curve of the stomach (longer pouch created due to his h/o achalasia potential need for future foregut surgery). Dissection was carried along the lesser curve at this point with the Harmonic scalpel working carefully back toward the lesser sac at right angles to the lesser curve. The free  lesser sac was then entered. After being sure all tubes were removed from the stomach an initial firing of the gold  load 60 mm linear stapler was fired at right angles across the lesser curve for about 4 cm. At this point we had the CRNA pass a e-wall tube down but she did have some trouble advancing it pass the hiatus (no force was used). E wall was removed and 54 hr lighted bougie was passed carefully down and we could see it enter the stomach. The bougie was removed and an EGD was passed by Dr Hassell Done. The esophagus was a little tortuous but no stricture was encountered. No bleeding in the esophagus. Just at the hiatus, the stomach angled posteriorly and I think that is why the CRNA had trouble passing the tube.  Dr Hassell Done scrubbed back in and Dr Lucia Gaskins took over handling the egd while continued to create the pouch. The gastric pouch was further mobilized posteriorly and then the pouch was completed with several further firings of the 60 mm blue load linear stapler up through the previously dissected angle of His. It was ensured that the pouch was completely mobilized away from the gastric remnant. This created a nice tubular 5 cm gastric pouch. Prior to each firing the egd scope was advanced and retracted into the pouch to make sure the stapler was not clamped on it as well as to ensure we were not on the GE junction. The patient had a large amount of perigastric fat tissue. I did place 3 clips in the upper abdomen - 2 on the very end of the remnant staple line  And the other on the last 1/2 inch of the pouch staple line for hemostasis. The Roux limb was then brought up in an antecolic fashion with the candycane facing to the patient's left without undue tension. The gastrojejunostomy was created with an initial posterior row of 2-0 Vicryl between the Roux limb and the staple line of the gastric pouch. Enterotomies were then made in the gastric pouch and the Roux limb with the harmonic scalpel and at approximately 2-2-1/2 cm anastomosis was created with a single firing of the 51mm blue load linear stapler. The staple line was  inspected and was intact without bleeding. The common enterotomy was then closed with running 2-0 Vicryl begun at either end and tied centrally.  an outer anterior layer of running 2-0 Vicryl was placed.  With the outlet of the gastrojejunostomy clamped and under saline irrigation the assistant performed upper endoscopy and with the gastric pouch tensely distended with air-there was no evidence of leak on this test. The pouch was desufflated. The Terance Hart defect was not closed due to large amount of adipose tissue in this area.. The abdomen was inspected for any evidence of bleeding or bowel injury and everything looked fine. The Nathanson retractor was removed under direct vision after coating the anastomosis with Eviceal tissue sealant. All CO2 was evacuated and trochars removed. Skin incisions were closed with 4-0 monocryl in a subcuticular fashion followed by steri-strips and bandages. Sponge needle and instrument counts were correct. The patient was taken to the PACU in good condition.    Leighton Ruff. Redmond Pulling, MD, FACS General, Bariatric, & Minimally Invasive Surgery [facility], [place]

## 2020-04-05 NOTE — Anesthesia Postprocedure Evaluation (Signed)
Anesthesia Post Note  Patient: Dreyson Mishkin Barraco  Procedure(s) Performed: LAPAROSCOPIC ROUX-EN-Y GASTRIC BYPASS WITH UPPER ENDOSCOPY (N/A Abdomen)     Patient location during evaluation: PACU Anesthesia Type: General Level of consciousness: awake and alert Pain management: pain level controlled Vital Signs Assessment: post-procedure vital signs reviewed and stable Respiratory status: spontaneous breathing, nonlabored ventilation and respiratory function stable Cardiovascular status: blood pressure returned to baseline and stable Postop Assessment: no apparent nausea or vomiting Anesthetic complications: no   No complications documented.  Last Vitals:  Vitals:   04/05/20 1430 04/05/20 1455  BP: (!) 169/85 (!) 155/81  Pulse: 60 61  Resp: 13 17  Temp: 36.6 C   SpO2: 95% 99%    Last Pain:  Vitals:   04/05/20 1515  TempSrc:   PainSc: 7                  Nour Scalise E Armelia Penton

## 2020-04-05 NOTE — Transfer of Care (Signed)
Immediate Anesthesia Transfer of Care Note  Patient: Glenn Lyons  Procedure(s) Performed: LAPAROSCOPIC ROUX-EN-Y GASTRIC BYPASS WITH UPPER ENDOSCOPY (N/A Abdomen)  Patient Location: PACU  Anesthesia Type:General  Level of Consciousness: awake, alert , oriented and patient cooperative  Airway & Oxygen Therapy: Patient Spontanous Breathing and Patient connected to face mask oxygen  Post-op Assessment: Report given to RN, Post -op Vital signs reviewed and stable and Patient moving all extremities  Post vital signs: Reviewed and stable  Last Vitals:  Vitals Value Taken Time  BP 150/62 04/05/20 1330  Temp    Pulse 66 04/05/20 1330  Resp 9 04/05/20 1329  SpO2 100 % 04/05/20 1330  Vitals shown include unvalidated device data.  Last Pain:  Vitals:   04/05/20 0842  TempSrc:   PainSc: 0-No pain      Patients Stated Pain Goal: 4 (52/58/94 [id number])  Complications: No complications documented.

## 2020-04-05 NOTE — Brief Op Note (Signed)
04/05/2020  1:29 PM  PATIENT:  Glenn Lyons  64 y.o. male  PRE-OPERATIVE DIAGNOSIS:  MORBID OBESITY  POST-OPERATIVE DIAGNOSIS:  MORBID OBESITY  PROCEDURE:  Procedure(s): LAPAROSCOPIC ROUX-EN-Y GASTRIC BYPASS WITH UPPER ENDOSCOPY (N/A)  SURGEON:  Surgeon(s) and Role:    * Greer Pickerel, MD - Primary    * Alphonsa Overall, MD - Assisting    * Johnathan Hausen, MD  PHYSICIAN ASSISTANT:   ASSISTANTS: Johnathan Hausen MD    ANESTHESIA:   general  EBL:  minimal   BLOOD ADMINISTERED:none  DRAINS: none   LOCAL MEDICATIONS USED:  MARCAINE    and OTHER exparel   SPECIMEN:  No Specimen  DISPOSITION OF SPECIMEN:  N/A  COUNTS:  YES  TOURNIQUET:  * No tourniquets in log *  DICTATION: .Dragon Dictation  PLAN OF CARE: Admit to inpatient   PATIENT DISPOSITION:  PACU - hemodynamically stable.   Delay start of Pharmacological VTE agent (>24hrs) due to surgical blood loss or risk of bleeding: no

## 2020-04-05 NOTE — Progress Notes (Signed)
Patient comfortably sitting in chair. He has demonstrated IS use. Will start water shortly.

## 2020-04-05 NOTE — Progress Notes (Signed)
RT NOTE:  Pt requested to be placed on CPAP machine for a nap. Settings were obtained from the pt stating his home regimen is 25/22. Pt states that he is comfortable at this time. Vitals stable, RT will continue to monitor.

## 2020-04-06 ENCOUNTER — Encounter (HOSPITAL_COMMUNITY): Payer: Self-pay | Admitting: General Surgery

## 2020-04-06 ENCOUNTER — Other Ambulatory Visit (HOSPITAL_COMMUNITY): Payer: Self-pay | Admitting: General Surgery

## 2020-04-06 LAB — CBC WITH DIFFERENTIAL/PLATELET
Abs Immature Granulocytes: 0.07 10*3/uL (ref 0.00–0.07)
Basophils Absolute: 0 10*3/uL (ref 0.0–0.1)
Basophils Relative: 0 %
Eosinophils Absolute: 0 10*3/uL (ref 0.0–0.5)
Eosinophils Relative: 0 %
HCT: 44.1 % (ref 39.0–52.0)
Hemoglobin: 14.1 g/dL (ref 13.0–17.0)
Immature Granulocytes: 1 %
Lymphocytes Relative: 7 %
Lymphs Abs: 1 10*3/uL (ref 0.7–4.0)
MCH: 30.1 pg (ref 26.0–34.0)
MCHC: 32 g/dL (ref 30.0–36.0)
MCV: 94 fL (ref 80.0–100.0)
Monocytes Absolute: 0.9 10*3/uL (ref 0.1–1.0)
Monocytes Relative: 6 %
Neutro Abs: 11.8 10*3/uL — ABNORMAL HIGH (ref 1.7–7.7)
Neutrophils Relative %: 86 %
Platelets: 252 10*3/uL (ref 150–400)
RBC: 4.69 MIL/uL (ref 4.22–5.81)
RDW: 13.6 % (ref 11.5–15.5)
WBC: 13.7 10*3/uL — ABNORMAL HIGH (ref 4.0–10.5)
nRBC: 0 % (ref 0.0–0.2)

## 2020-04-06 LAB — GLUCOSE, CAPILLARY
Glucose-Capillary: 129 mg/dL — ABNORMAL HIGH (ref 70–99)
Glucose-Capillary: 154 mg/dL — ABNORMAL HIGH (ref 70–99)
Glucose-Capillary: 168 mg/dL — ABNORMAL HIGH (ref 70–99)
Glucose-Capillary: 169 mg/dL — ABNORMAL HIGH (ref 70–99)

## 2020-04-06 LAB — COMPREHENSIVE METABOLIC PANEL
ALT: 67 U/L — ABNORMAL HIGH (ref 0–44)
AST: 55 U/L — ABNORMAL HIGH (ref 15–41)
Albumin: 3.8 g/dL (ref 3.5–5.0)
Alkaline Phosphatase: 74 U/L (ref 38–126)
Anion gap: 7 (ref 5–15)
BUN: 14 mg/dL (ref 8–23)
CO2: 25 mmol/L (ref 22–32)
Calcium: 9.5 mg/dL (ref 8.9–10.3)
Chloride: 102 mmol/L (ref 98–111)
Creatinine, Ser: 0.92 mg/dL (ref 0.61–1.24)
GFR, Estimated: >60 mL/min (ref >60)
Glucose, Bld: 170 mg/dL — ABNORMAL HIGH (ref 70–99)
Potassium: 4.6 mmol/L (ref 3.5–5.1)
Sodium: 134 mmol/L — ABNORMAL LOW (ref 135–145)
Total Bilirubin: 0.9 mg/dL (ref 0.3–1.2)
Total Protein: 7 g/dL (ref 6.5–8.1)

## 2020-04-06 MED ORDER — INSULIN ASPART 100 UNIT/ML FLEXPEN
0.0000 [IU] | PEN_INJECTOR | Freq: Three times a day (TID) | SUBCUTANEOUS | 4 refills | Status: DC
Start: 2020-04-06 — End: 2020-07-22

## 2020-04-06 MED ORDER — TRAMADOL HCL 50 MG PO TABS: 50.0000 mg | ORAL_TABLET | Freq: Four times a day (QID) | ORAL | 0 refills | Status: DC | PRN

## 2020-04-06 MED ORDER — ENOXAPARIN SODIUM 60 MG/0.6ML ~~LOC~~ SOLN
60.0000 mg | Freq: Two times a day (BID) | SUBCUTANEOUS | 0 refills | Status: DC
Start: 2020-04-06 — End: 2022-02-09

## 2020-04-06 MED ORDER — ONDANSETRON 4 MG PO TBDP: 4.0000 mg | ORAL_TABLET | Freq: Four times a day (QID) | ORAL | 0 refills | Status: DC | PRN

## 2020-04-06 MED ORDER — PANTOPRAZOLE SODIUM 40 MG PO TBEC: 40.0000 mg | DELAYED_RELEASE_TABLET | Freq: Every day | ORAL | 0 refills | Status: DC

## 2020-04-06 MED ORDER — GABAPENTIN 100 MG PO CAPS: 200.0000 mg | ORAL_CAPSULE | Freq: Two times a day (BID) | ORAL | 0 refills | Status: DC

## 2020-04-06 MED ORDER — ACETAMINOPHEN 500 MG PO TABS: 1000.0000 mg | ORAL_TABLET | Freq: Three times a day (TID) | ORAL | 0 refills | Status: AC

## 2020-04-06 MED FILL — ONDANSETRON ODT 4 MG TABLET: 4 | 5 days supply | Qty: 15 | Fill #0

## 2020-04-06 MED FILL — ENOXAPARIN SODIUM 60 MG/0.6: 60 | 28 days supply | Qty: 34 | Fill #0

## 2020-04-06 MED FILL — traMADol HCL 50 MG TABS: 50 | 2 days supply | Qty: 10 | Fill #0

## 2020-04-06 MED FILL — PANTOPRAZOLE SOD DR 40 MG T: 40 | 90 days supply | Qty: 90 | Fill #0

## 2020-04-06 MED FILL — GABAPENTIN 100 MG CAPSULE: 100 | 10 days supply | Qty: 20 | Fill #0

## 2020-04-06 NOTE — Progress Notes (Signed)
Patient alert and oriented, pain is controlled. Patient is tolerating fluids, advanced to protein shake today, patient is tolerating well. Reviewed Gastric Bypass discharge instructions with patient and patient is able to articulate understanding. Provided information on BELT program, Support Group and [facility] outpatient pharmacy. All questions answered, will continue to monitor.   Total fluid intake 690 Per dehydration protocol call back one week postop

## 2020-04-06 NOTE — Progress Notes (Signed)
Started protein at 0500. Patient completed 3rd cups of protein. No c/o pain, or nausea. Patient tolerated his water and protein well.

## 2020-04-06 NOTE — Progress Notes (Signed)
Lovenox teaching kit provided to patient, demonstrated administration with Terry Thurman RN.  Questions answered. 

## 2020-04-06 NOTE — Progress Notes (Signed)
Patient alert and oriented, Post op day 1.  Provided support and encouragement.  Encouraged pulmonary toilet, ambulation and small sips of liquids.  Completed 12 ounces bari clear fluid and 8 ounces of protein.  All questions answered.  Will continue to monitor.

## 2020-04-06 NOTE — Progress Notes (Addendum)
Inpatient Diabetes Program Recommendations  AACE/ADA: New Consensus Statement on Inpatient Glycemic Control (2015)  Target Ranges:  Prepandial:   less than 140 mg/dL      Peak postprandial:   less than 180 mg/dL (1-2 hours)      Critically ill patients:  140 - 180 mg/dL   Lab Results  Component Value Date   GLUCAP 154 (H) 04/06/2020   HGBA1C 8.5 (H) 04/01/2020    Review of Glycemic Control  Diabetes history: DM2 Outpatient Diabetes medications: Basaglar 80 units QHS, glipizide 5-10 mg ac Breakfast Current orders for Inpatient glycemic control: Novolog 0-20 units Q4H.  HgbA1C - 8.5%  Inpatient Diabetes Program Recommendations:  Novolog 0-20 units tidwc and hs Monitor blood sugars at least 4x/day and call PCP if blood sugars consistently > 225-250 mg/dL.  Will see pt this am.  Thank you. Lorenda Peck, RD, LDN, CDE Inpatient Diabetes Coordinator [phone]     Addendum: Spoke with pt at bedside regarding going home on Novolog 0-20 units tidwc and hs. Also discussed monitoring blood sugars at least 4x/day and calling PCP if blood sugars trending high. Pt voiced understanding. Also discussed Libre for monitoring blood sugars without fingersticks. Pt to speak with PCP about it. Seems motivated to make lifestyle changes to lose weight and control blood sugars.

## 2020-04-06 NOTE — Discharge Summary (Addendum)
Physician Discharge Summary  Glenn Lyons:[id number] DOB: [birth year] DOA: 04/05/2020  PCP: Tonia Ghent, MD  Admit date: 04/05/2020 Discharge date: 04/06/2020  Recommendations for Outpatient Follow-up:     Follow-up Information    Greer Pickerel, MD. Go on 04/28/2020.   Specialty: General Surgery Why: at 1015 am.  Please arrive 15 minutes prior to appointment.  Thank you Contact information: [address] [place] [address] [phone]        Carlena Hurl, PA-C. Go on 05/27/2020.   Specialty: General Surgery Why: at 845 am.  Please arrive 15 minutes prior to appointment.  Thank you Contact information: [place] [address] [phone]              Discharge Diagnoses:  Principal Problem:   Morbid obesity with BMI of 50.0-59.9, adult ([place]) Active Problems:   GERD   OSA (obstructive sleep apnea)   PACEMAKER, [facility]   OA (osteoarthritis) of knee   Essential hypertension   Type 2 diabetes mellitus with diabetic neuropathy, unspecified (HCC)   HLD (hyperlipidemia)   S/P gastric bypass   Surgical Procedure: Laparoscopic Roux-en-Y gastric bypass, upper endoscopy  Discharge Condition: Good Disposition: Home  Diet recommendation: Postoperative gastric bypass diet  Filed Weights   04/05/20 0817  Weight: (!) 171.1 kg     Hospital Course:  The patient was admitted for a planned laparoscopic Roux-en-Y gastric bypass. Please see operative note. Preoperatively the patient was given 5000 units of subcutaneous heparin for DVT prophylaxis. ERAS protocol was used. Postoperative prophylactic Lovenox dosing was started on the evening of postoperative day 0.  The patient was started on ice chips and water on the evening of POD 0 which they tolerated. On postoperative day 1 The patient's diet was advanced to protein shakes which they also tolerated. On POD 2, The patient was ambulating without difficulty. Their vital signs are  stable without fever or tachycardia. Their hemoglobin had remained stable. The patient was maintained on their home settings for CPAP therapy. The patient had received discharge instructions and counseling. They were deemed stable for discharge.  Diabetes team left recommendations for discharge - Ensign rec - met criteria for extended vte prophylaxis for lovenox  BP 121/65 (BP Location: Left Arm)   Pulse (!) 59   Temp 98.9 F (37.2 C) (Axillary)   Resp 18   Ht '5\' 11"'  (1.803 m)   Wt (!) 171.1 kg   SpO2 98%   BMI 52.61 kg/m   Gen: alert, NAD, non-toxic appearing Pupils: equal, no scleral icterus Pulm: Lungs clear to auscultation, symmetric chest rise CV: regular rate and rhythm Abd: soft, min tender, nondistended. No cellulitis. No incisional hernia Ext: no edema, no calf tenderness Skin: no rash, no jaundice  Discharge Instructions  Discharge Instructions    Ambulate hourly while awake   Complete by: As directed    Call MD for:  difficulty breathing, headache or visual disturbances   Complete by: As directed    Call MD for:  persistant dizziness or light-headedness   Complete by: As directed    Call MD for:  persistant nausea and vomiting   Complete by: As directed    Call MD for:  redness, tenderness, or signs of infection (pain, swelling, redness, odor or green/yellow discharge around incision site)   Complete by: As directed    Call MD for:  severe uncontrolled pain   Complete by: As directed  Call MD for:  temperature >101 F   Complete by: As directed    Diet bariatric full liquid   Complete by: As directed    Discharge instructions   Complete by: As directed    See bariatric discharge instructions   Incentive spirometry   Complete by: As directed    Perform hourly while awake     Allergies as of 04/06/2020      Reactions   Morphine Other (See Comments)   Cardiac Arrest   Metformin And Related    Intolerant of any dose due to aches       Medication List    STOP taking these medications   Basaglar KwikPen 100 UNIT/ML   cephALEXin 500 MG capsule Commonly known as: KEFLEX   glipiZIDE 5 MG tablet Commonly known as: GLUCOTROL   meloxicam 7.5 MG tablet Commonly known as: MOBIC     TAKE these medications   acetaminophen 500 MG tablet Commonly known as: TYLENOL Take 2 tablets (1,000 mg total) by mouth every 8 (eight) hours for 5 days.   atorvastatin 10 MG tablet Commonly known as: LIPITOR TAKE 1 TABLET BY MOUTH EVERY DAY What changed: when to take this   BD Pen Needle Nano 2nd Gen 32G X 4 MM Misc Generic drug: Insulin Pen Needle 1 DEVICE BY DOES NOT APPLY ROUTE DAILY. USE DAILY WITH INSULIN PEN INJECTION.   cetirizine 10 MG tablet Commonly known as: ZYRTEC Take 10 mg by mouth daily.   citalopram 20 MG tablet Commonly known as: CELEXA Take 1 tablet (20 mg total) by mouth daily.   enoxaparin 60 MG/0.6ML injection Commonly known as: LOVENOX Inject 0.6 mLs (60 mg total) into the skin 2 (two) times daily for 28 days.   gabapentin 100 MG capsule Commonly known as: NEURONTIN Take 2 capsules (200 mg total) by mouth every 12 (twelve) hours.   insulin aspart 100 UNIT/ML FlexPen Commonly known as: NOVOLOG Inject 0-12 Units into the skin 3 (three) times daily with meals. For blood sugars: 0 to 120           give 0 units novolog 121 to 150       Give 3 units 151 to 200       Give 4 units 201 to 250       Give 7 units 251 to 300       Give  11 units 301 to 350       Give 15 units 351 to 400       Give 20 units >400                Call Doctor   lisinopril 2.5 MG tablet Commonly known as: Zestril Take 1 tablet (2.5 mg total) by mouth daily. Notes to patient: Monitor Blood Pressure Daily and keep a log for primary care physician.  You may need to make changes to your medications with rapid weight loss.     multivitamin with minerals Tabs tablet Take 1 tablet by mouth daily.   ondansetron 4 MG disintegrating  tablet Commonly known as: ZOFRAN-ODT Take 1 tablet (4 mg total) by mouth every 6 (six) hours as needed for nausea or vomiting.   onetouch ultrasoft lancets USE TO TEST BLOOD SUGAR ONCE DAILY OR AS NEEDED   OneTouch Verio test strip Generic drug: glucose blood USE TO TEST BLOOD SUGAR ONCE DAILY OR AS INSTRUCTED   OneTouch Verio w/Device Kit Inject 1 kit into the skin every morning. Use to test  blood sugar each day before breakfast and 2 hours after a meal for 2 weeks.  Diagnosis:  E11.9  Non-insulin dependent.   pantoprazole 40 MG tablet Commonly known as: PROTONIX Take 1 tablet (40 mg total) by mouth daily.   torsemide 20 MG tablet Commonly known as: DEMADEX Take 1 tablet (20 mg total) by mouth daily. What changed: when to take this   traMADol 50 MG tablet Commonly known as: ULTRAM Take 1 tablet (50 mg total) by mouth every 6 (six) hours as needed (pain).       Follow-up Information    Greer Pickerel, MD. Go on 04/28/2020.   Specialty: General Surgery Why: at 1015 am.  Please arrive 15 minutes prior to appointment.  Thank you Contact information: [address] [place] [address] [phone]        Carlena Hurl, PA-C. Go on 05/27/2020.   Specialty: General Surgery Why: at 845 am.  Please arrive 15 minutes prior to appointment.  Thank you Contact information: [address] [place] [address] [phone]                The results of significant diagnostics from this hospitalization (including imaging, microbiology, ancillary and laboratory) are listed below for reference.    Significant Diagnostic Studies: No results found.  Labs: Basic Metabolic Panel: Recent Labs  Lab 04/01/20 1012 04/06/20 0527  NA 134* 134*  K 4.4 4.6  CL 101 102  CO2 25 25  GLUCOSE 143* 170*  BUN 27* 14  CREATININE 0.70 0.92  CALCIUM 9.8 9.5   Liver Function Tests: Recent Labs  Lab 04/01/20 1012 04/06/20 0527  AST 22 55*  ALT 32 67*   ALKPHOS 72 74  BILITOT 0.9 0.9  PROT 7.0 7.0  ALBUMIN 4.0 3.8    CBC: Recent Labs  Lab 04/01/20 1012 04/05/20 1412 04/06/20 0527  WBC 10.6*  --  13.7*  NEUTROABS 8.3*  --  11.8*  HGB 14.2 14.8 14.1  HCT 42.9 45.9 44.1  MCV 92.5  --  94.0  PLT 243  --  252    CBG: Recent Labs  Lab 04/05/20 2016 04/06/20 0010 04/06/20 0414 04/06/20 0736 04/06/20 1149  GLUCAP 178* 169* 168* 154* 129*    Principal Problem:   Morbid obesity with BMI of 50.0-59.9, adult ([place]) Active Problems:   GERD   OSA (obstructive sleep apnea)   PACEMAKER, [facility]   OA (osteoarthritis) of knee   Essential hypertension   Type 2 diabetes mellitus with diabetic neuropathy, unspecified ([place])   HLD (hyperlipidemia)   S/P gastric bypass   Time coordinating discharge: 15 min  Signed:  Gayland Curry, MD [facility], [place] 04/06/2020, 1:33 PM

## 2020-04-06 NOTE — Progress Notes (Signed)
Pt alert and oriented. Instructions given by Parks Neptune, RN. Pt d/cd to home.

## 2020-04-06 NOTE — Discharge Instructions (Signed)
Sliding Scale Insulin   CBG 70 - 120: 0 units NOVOLOG   CBG 121 - 150: 3 units NOVOLOG   CBG 151 - 200: 4 units NOVOLOG   CBG 201 - 250: 7 units NOVOLOG   CBG 251 - 300: 11 units NOVOLOG   CBG 301 - 350: 15 units NOVOLOG   CBG 351 - 400: 20 units NOVOLOG            GASTRIC BYPASS/SLEEVE  Home Care Instructions   These instructions are to help you care for yourself when you go home.  Call: If you have any problems. . Call [phone] and ask for the surgeon on call . If you need immediate help, come to the ER at [facility] : [facility].  . Tell the ER staff that you are a new post-op gastric bypass or gastric sleeve patient   Signs and symptoms to report: . Severe vomiting or nausea o If you cannot keep down clear liquids for longer than 1 day, call your surgeon  . Abdominal pain that does not get better after taking your pain medication . Fever over 100.4 F with chills . Heart beating over 100 beats a minute . Shortness of breath at rest . Chest pain .  Redness, swelling, drainage, or foul odor at incision (surgical) sites .  If your incisions open or pull apart . Swelling or pain in calf (lower leg) . Diarrhea (Loose bowel movements that happen often), frequent watery, uncontrolled bowel movements . Constipation, (no bowel movements for 3 days) if this happens: Pick one o Milk of Magnesia, 2 tablespoons by mouth, 3 times a day for 2 days if needed o Stop taking Milk of Magnesia once you have a bowel movement o Call your doctor if constipation continues Or o Miralax  (instead of Milk of Magnesia) following the label instructions o Stop taking Miralax once you have a bowel movement o Call your doctor if constipation continues . Anything you think is not normal   Normal side effects after surgery: . Unable to sleep at night or unable to focus . Irritability or moody . Being tearful (crying) or depressed These are common complaints, possibly related to your anesthesia  medications that put you to sleep, stress of surgery, and change in lifestyle.  This usually goes away a few weeks after surgery.  If these feelings continue, call your primary care doctor.   Wound Care: You may have surgical glue, steri-strips, or staples over your incisions after surgery . Surgical glue:  Looks like a clear film over your incisions and will wear off a little at a time . Steri-strips: Strips of tape over your incisions. You may notice a yellowish color on the skin under the steri-strips. This is used to make the   steri-strips stick better. Do not pull the steri-strips off - let them fall off . Staples: Jodell Cipro may be removed before you leave the hospital o If you go home with staples, call [facility], [phone]) [phone] at for an appointment with your surgeon's nurse to have staples removed 10 days after surgery. . Showering: You may shower two (2) days after your surgery unless your surgeon tells you differently o Wash gently around incisions with warm soapy water, rinse well, and gently pat dry  o No tub baths until staples are removed, steri-strips fall off or glue is gone.    Medications: Marland Kitchen Medications should be liquid or crushed if larger than the size of a dime . Extended release  pills (medication that release a little bit at a time through the day) should NOT be crushed or cut. (examples include XL, ER, DR, SR) . Depending on the size and number of medications you take, you may need to space (take a few throughout the day)/change the time you take your medications so that you do not over-fill your pouch (smaller stomach) . Make sure you follow-up with your primary care doctor to make medication changes needed during rapid weight loss and life-style changes . If you have diabetes, follow up with the doctor that orders your diabetes medication(s) within one week after surgery and check your blood sugar regularly. . Do not drive while taking prescription pain  medication  . It is ok to take Tylenol by the bottle instructions with your pain medicine or instead of your pain medicine as needed.  DO NOT TAKE NSAIDS (EXAMPLES OF NSAIDS:  IBUPROFREN/ NAPROXEN)  Diet:                    First 2 Weeks  You will see the dietician t about two (2) weeks after your surgery. The dietician will increase the types of foods you can eat if you are handling liquids well: Marland Kitchen If you have severe vomiting or nausea and cannot keep down clear liquids lasting longer than 1 day, call your surgeon @ [phone]) Protein Shake . Drink at least 2 ounces of shake 5-6 times per day . Each serving of protein shakes (usually 8 - 12 ounces) should have: o 15 grams of protein  o And no more than 5 grams of carbohydrate  . Goal for protein each day: o Men = 80 grams per day o Women = 60 grams per day . Protein powder may be added to fluids such as non-fat milk or Lactaid milk or unsweetened Soy/Almond milk (limit to 35 grams added protein powder per serving)  Hydration . Slowly increase the amount of water and other clear liquids as tolerated (See Acceptable Fluids) . Slowly increase the amount of protein shake as tolerated  .  Sip fluids slowly and throughout the day.  Do not use straws. . May use sugar substitutes in small amounts (no more than 6 - 8 packets per day; i.e. Splenda)  Fluid Goal . The first goal is to drink at least 8 ounces of protein shake/drink per day (or as directed by the nutritionist); some examples of protein shakes are [employer] Johnson, AMR Corporation, EAS Edge HP, and Unjury. See handout from pre-op Bariatric Education Class: o Slowly increase the amount of protein shake you drink as tolerated o You may find it easier to slowly sip shakes throughout the day o It is important to get your proteins in first . Your fluid goal is to drink 64 - 100 ounces of fluid daily o It may take a few weeks to build up to this . 32 oz (or more) should be clear liquids   And  . 32 oz (or more) should be full liquids (see below for examples) . Liquids should not contain sugar, caffeine, or carbonation  Clear Liquids: . Water or Sugar-free flavored water (i.e. Fruit H2O, Propel) . Decaffeinated coffee or tea (sugar-free) . [employer], [employer], Minute [facility] . Sugar-free Jell-O . Bouillon or broth . Sugar-free Popsicle:   *Less than 20 calories each; Limit 1 per day  Full Liquids: Protein Shakes/Drinks + 2 choices per day of other full liquids . Full liquids must be: o No  More Than 15 grams of Carbs per serving  o No More Than 3 grams of Fat per serving . Strained low-fat cream soup (except Cream of Potato or Tomato) . Non-Fat milk . Fat-free Lactaid Milk . Unsweetened Soy Or Unsweetened Almond Milk . Low Sugar yogurt (Dannon Lite & Fit, [place] yogurt; Oikos Triple Zero; Chobani Simply 100; Yoplait 100 calorie [place] - No Fruit on the Bottom)    Vitamins and Minerals . Start 1 day after surgery unless otherwise directed by your surgeon . Chewable Bariatric Specific Multivitamin / Multimineral Supplement with iron (Example: Bariatric Advantage Multi EA) . Chewable Calcium with Vitamin D-3 (Example: 3 Chewable Calcium Plus 600 with Vitamin D-3) o Take 500 mg three (3) times a day for a total of 1500 mg each day o Do not take all 3 doses of calcium at one time as it may cause constipation, and you can only absorb 500 mg  at a time  o Do not mix multivitamins containing iron with calcium supplements; take 2 hours apart . Menstruating women and those with a history of anemia (a blood disease that causes weakness) may need extra iron o Talk with your doctor to see if you need more iron . Do not stop taking or change any vitamins or minerals until you talk to your dietitian or surgeon . Your Dietitian and/or surgeon must approve all vitamin and mineral supplements   Activity and Exercise: Limit your physical activity as instructed by your doctor.   It is important to continue walking at home.  During this time, use these guidelines: . Do not lift anything greater than ten (10) pounds for at least two (2) weeks . Do not go back to work or drive until Engineer, production says you can . You may have sex when you feel comfortable  o It is VERY important for male patients to use a reliable birth control method; fertility often increases after surgery  o All hormonal birth control will be ineffective for 30 days after surgery due to medications given during surgery a barrier method must be used. o Do not get pregnant for at least 18 months . Start exercising as soon as your doctor tells you that you can o Make sure your doctor approves any physical activity . Start with a simple walking program . Walk 5-15 minutes each day, 7 days per week.  . Slowly increase until you are walking 30-45 minutes per day Consider joining our [place] program. [phone] or email [email or web address]   Special Instructions Things to remember: . Use your CPAP when sleeping if this applies to you  . [facility] has two free Bariatric Surgery Support Groups that meet monthly o The 3rd Thursday of each month, 6 pm o The 2nd Friday of each month, 11:30 only through December 2021 . It is very important to keep all follow up appointments with your surgeon, dietitian, primary care physician, and behavioral health practitioner . Routine follow up schedule with your surgeon include appointments at 2-3 weeks, 6-8 weeks, 6 months, and 1 year at a minimum.  Your surgeon may request to see you more often.   . After the first year, please follow up with your bariatric surgeon and dietitian at least once a year in order to maintain best weight loss results  [facility]: [place]: [phone] Bariatric Nurse Coordinator: [phone]      Reviewed and Endorsed  by [facility] Patient Education Committee,  June, 2016 Edits Approved: Aug, 2018

## 2020-04-06 NOTE — Progress Notes (Signed)
Nutrition Brief Note  RD consulted for diet education for patient s/p bariatric surgery. Bariatric nurse coordinator providing education.  If nutrition issues arise, please consult RD.   Clayton Bibles, MS, RD, LDN Inpatient Clinical Dietitian Contact information available via Amion

## 2020-04-08 ENCOUNTER — Ambulatory Visit (INDEPENDENT_AMBULATORY_CARE_PROVIDER_SITE_OTHER): Payer: 59

## 2020-04-08 DIAGNOSIS — R001 Bradycardia, unspecified: Secondary | ICD-10-CM | POA: Diagnosis not present

## 2020-04-08 LAB — CUP PACEART REMOTE DEVICE CHECK
Battery Remaining Longevity: 125 mo
Battery Remaining Percentage: 95.5 %
Battery Voltage: 2.99 V
Brady Statistic AP VP Percent: 1 %
Brady Statistic AP VS Percent: 43 %
Brady Statistic AS VP Percent: 1 %
Brady Statistic AS VS Percent: 57 %
Brady Statistic RA Percent Paced: 41 %
Brady Statistic RV Percent Paced: 1 %
Date Time Interrogation Session: 20211111020013
Implantable Lead Implant Date: 20061219
Implantable Lead Implant Date: 20061219
Implantable Lead Location: 753859
Implantable Lead Location: 753860
Implantable Pulse Generator Implant Date: 20191204
Lead Channel Impedance Value: 1550 Ohm
Lead Channel Impedance Value: 480 Ohm
Lead Channel Pacing Threshold Amplitude: 1.125 V
Lead Channel Pacing Threshold Amplitude: 1.625 V
Lead Channel Pacing Threshold Pulse Width: 0.5 ms
Lead Channel Pacing Threshold Pulse Width: 0.5 ms
Lead Channel Sensing Intrinsic Amplitude: 5 mV
Lead Channel Sensing Intrinsic Amplitude: 9.9 mV
Lead Channel Setting Pacing Amplitude: 1.375
Lead Channel Setting Pacing Amplitude: 3.125
Lead Channel Setting Pacing Pulse Width: 0.5 ms
Lead Channel Setting Sensing Sensitivity: 2.5 mV
Pulse Gen Model: 2272
Pulse Gen Serial Number: 9090970

## 2020-04-12 ENCOUNTER — Telehealth (HOSPITAL_COMMUNITY): Payer: Self-pay

## 2020-04-12 NOTE — Progress Notes (Signed)
Remote pacemaker transmission.   

## 2020-04-12 NOTE — Telephone Encounter (Signed)
Patient called to discuss post bariatric surgery follow up questions.  See below:   1.  Tell me about your pain and pain management?denies has used tramadol  2.  Let's talk about fluid intake.  How much total fluid are you taking in?64 +  3.  How much protein have you taken in the last 2 days?90  4.  Have you had nausea?  Tell me about when have experienced nausea and what you did to help?denies  5.  Has the frequency or color changed with your urine?light in color  6.  Tell me what your incisions look like?no problems  7.  Have you been passing gas? BM?BM Thursday since surgery  8.  If a problem or question were to arise who would you call?  Do you know contact numbers for [place], [facility], and [facility]?aware of how to contact all services  9.  How has the walking going?walking around  10.  How are your vitamins and calcium going?  How are you taking them?mvi and calcium started  Blood sugars have been managed with sliding scale patient reports 81-120 range.  Lovenox going well,no problems.  Sees PCP tomorrow

## 2020-04-13 ENCOUNTER — Other Ambulatory Visit: Payer: Self-pay

## 2020-04-13 ENCOUNTER — Encounter: Payer: Self-pay | Admitting: Family Medicine

## 2020-04-13 ENCOUNTER — Ambulatory Visit (INDEPENDENT_AMBULATORY_CARE_PROVIDER_SITE_OTHER): Payer: 59 | Admitting: Family Medicine

## 2020-04-13 VITALS — BP 130/60 | HR 67 | Temp 98.1°F | Ht 71.0 in | Wt 369.2 lb

## 2020-04-13 DIAGNOSIS — Z9884 Bariatric surgery status: Secondary | ICD-10-CM | POA: Diagnosis not present

## 2020-04-13 NOTE — Patient Instructions (Signed)
Go to the lab on the way out.   If you have mychart we'll likely use that to update you.    Update me as needed in the meantime.  I want to recheck you in about 3 months.  We can do labs at the visit.   Let me know if you have increasing insulin use/need in the meantime.  Take care.  Glad to see you.

## 2020-04-13 NOTE — Progress Notes (Signed)
This visit occurred during the SARS-CoV-2 public health emergency.  Safety protocols were in place, including screening questions prior to the visit, additional usage of staff PPE, and extensive cleaning of exam room while observing appropriate contact time as indicated for disinfecting solutions.  Inpatient f/u from gastric bypass.  Weight is coming down.  D/w pt. he is on routine postop diet. He can tell a change and he feels better.  He had some bruising that is resolving on the abdominal wall. His legs clearly feel better in the meantime. He has less joint aches.  Sugar average 118 with prn insulin use based on SSI.  He is up walking.  No fevers or vomiting.  He is moving his bowels now.  BM yesterday, took mirlax a few days ago.  Discussed prn miralax.  He is off most of his oral meds.  He is still on PPI with prn tramadol. We talked about options with his oral meds. His mood is clearly better.  He has some occ upper leg/buttock and lower back discomfort but not pain at night. This is a claudication when he is walking during the day.  Meds, vitals, and allergies reviewed.   ROS: Per HPI unless specifically indicated in ROS section   GEN: nad, alert and oriented HEENT: ncat NECK: supple w/o LA CV: rrr PULM: ctab, no inc wob ABD: soft, +bs, Bruising resolving on the abd wall. EXT: no edema SKIN: No rash. Skin well-perfused  At least 30 minutes were devoted to patient care in this encounter (this can potentially include time spent reviewing the patient's file/history, interviewing and examining the patient, counseling/reviewing plan with patient, ordering referrals, ordering tests, reviewing relevant laboratory or x-ray data, and documenting the encounter).

## 2020-04-14 LAB — CBC WITH DIFFERENTIAL/PLATELET
Basophils Absolute: 0.1 10*3/uL (ref 0.0–0.1)
Basophils Relative: 0.8 % (ref 0.0–3.0)
Eosinophils Absolute: 0.3 10*3/uL (ref 0.0–0.7)
Eosinophils Relative: 2.9 % (ref 0.0–5.0)
HCT: 41.5 % (ref 39.0–52.0)
Hemoglobin: 14 g/dL (ref 13.0–17.0)
Lymphocytes Relative: 11.8 % — ABNORMAL LOW (ref 12.0–46.0)
Lymphs Abs: 1.1 10*3/uL (ref 0.7–4.0)
MCHC: 33.7 g/dL (ref 30.0–36.0)
MCV: 89.1 fl (ref 78.0–100.0)
Monocytes Absolute: 0.7 10*3/uL (ref 0.1–1.0)
Monocytes Relative: 7.1 % (ref 3.0–12.0)
Neutro Abs: 7.2 10*3/uL (ref 1.4–7.7)
Neutrophils Relative %: 77.4 % — ABNORMAL HIGH (ref 43.0–77.0)
Platelets: 250 10*3/uL (ref 150.0–400.0)
RBC: 4.65 Mil/uL (ref 4.22–5.81)
RDW: 13.8 % (ref 11.5–15.5)
WBC: 9.3 10*3/uL (ref 4.0–10.5)

## 2020-04-14 LAB — BASIC METABOLIC PANEL
BUN: 17 mg/dL (ref 6–23)
CO2: 28 mEq/L (ref 19–32)
Calcium: 9.9 mg/dL (ref 8.4–10.5)
Chloride: 101 mEq/L (ref 96–112)
Creatinine, Ser: 0.9 mg/dL (ref 0.40–1.50)
GFR: 91.37 mL/min (ref >60.00)
Glucose, Bld: 128 mg/dL — ABNORMAL HIGH (ref 70–99)
Potassium: 4.5 mEq/L (ref 3.5–5.1)
Sodium: 135 mEq/L (ref 135–145)

## 2020-04-14 NOTE — Assessment & Plan Note (Addendum)
He will continue Lovenox injections in the meantime per protocol until he finishes his current prescription. I expect him to gradually need insulin less and less often and that should eventually drop off his med list. He will monitor his sugar and treat as listed with sliding scale in the meantime. He can take Tylenol and tramadol as needed. It makes sense to continue pantoprazole. He wanted to stop most of his other medications and I think that makes sense for now. He will continue his multivitamin. We can plan on recheck in about 3 months. We can do labs at that point if needed. He has interval follow-up with outside clinic in the meantime. We talked about rechecking his labs today. He will continue with his routine diet. He does feel better in the meantime and he is thankful for that. I thank all involved.

## 2020-04-20 ENCOUNTER — Encounter: Payer: BC Managed Care – PPO | Attending: General Surgery | Admitting: Skilled Nursing Facility1

## 2020-04-20 ENCOUNTER — Telehealth: Payer: Self-pay | Admitting: Skilled Nursing Facility1

## 2020-04-20 DIAGNOSIS — E669 Obesity, unspecified: Secondary | ICD-10-CM | POA: Insufficient documentation

## 2020-04-20 NOTE — Telephone Encounter (Signed)
Pt called to cancel post op class.   Dietitian called pt to check in on status.    LVM

## 2020-04-21 ENCOUNTER — Telehealth: Payer: Self-pay | Admitting: Skilled Nursing Facility1

## 2020-04-21 NOTE — Telephone Encounter (Signed)
Dietitian called pt to check in on status.   Pt states he is fine and does not have any questions.

## 2020-04-27 ENCOUNTER — Encounter: Payer: BC Managed Care – PPO | Admitting: Skilled Nursing Facility1

## 2020-04-27 ENCOUNTER — Other Ambulatory Visit: Payer: Self-pay

## 2020-04-27 DIAGNOSIS — E669 Obesity, unspecified: Secondary | ICD-10-CM | POA: Diagnosis not present

## 2020-04-27 NOTE — Progress Notes (Signed)
2 Week Post-Operative Nutrition Class   Patient was seen on 04/27/2020 for Post-Operative Nutrition education at the [facility].    Surgery date: 04/05/2020 Surgery type: RYGB [facility] weight at [facility]: 399.7lbs Weight today: 358.8 lbs   Body Composition Scale Date  Total Body Fat % 41.6  Visceral Fat 43  Fat-Free Mass % 58.3   Total Body Water % 39.3   Muscle-Mass lbs 67.8  Body Fat Displacement          Torso  lbs 92.8         Left Leg  lbs 18.5         Right Leg  lbs 18.5         Left Arm  lbs 9.2         Right Arm   lbs 9.2     The following the learning objectives were met by the patient during this course:  Identifies Phase 3 (Soft, High Proteins) Dietary Goals and will begin from 2 weeks post-operatively to 2 months post-operatively  Identifies appropriate sources of fluids and proteins   Identifies appropriate fat sources and healthy verses unhealthy fat types    States protein recommendations and appropriate sources post-operatively  Identifies the need for appropriate texture modifications, mastication, and bite sizes when consuming solids  Identifies appropriate multivitamin and calcium sources post-operatively  Describes the need for physical activity post-operatively and will follow MD recommendations  States when to call healthcare provider regarding medication questions or post-operative complications   Handouts given during class include:  Phase 3A: Soft, High Protein Diet Handout  Phase 3 High Protein Meals  Healthy Fats   Follow-Up Plan: Patient will follow-up at [facility] in 6 weeks for 2 month post-op nutrition visit for diet advancement per MD.

## 2020-05-04 ENCOUNTER — Telehealth: Payer: Self-pay | Admitting: Skilled Nursing Facility1

## 2020-05-04 NOTE — Telephone Encounter (Signed)
RD called pt to verify fluid intake once starting soft, solid proteins 2 week post-bariatric surgery.   Daily Fluid intake: Daily Protein intake:  Concerns/issues:   LVM 

## 2020-05-25 ENCOUNTER — Telehealth: Payer: Self-pay | Admitting: Family Medicine

## 2020-05-25 NOTE — Telephone Encounter (Signed)
[facility] called and wanted to know about getting a PA for a medication Zilretta-bupivirane XR, due to Johnathin stated the medication is heaven sent and would need a PA to get the medication covered otherwise he not covered.

## 2020-05-25 NOTE — Telephone Encounter (Signed)
Verbal PA  BP:ZWCHEN - [phone]

## 2020-05-25 NOTE — Telephone Encounter (Signed)
If this is for the joint injection, the PA would need to come through the clinic that is doing the injections, ie ortho.  Please have him check with them.  Thanks.

## 2020-05-26 ENCOUNTER — Ambulatory Visit: Payer: 59 | Admitting: Family Medicine

## 2020-05-27 ENCOUNTER — Other Ambulatory Visit: Payer: Self-pay | Admitting: *Deleted

## 2020-05-27 MED ORDER — ZILRETTA 32 MG IX SRER: 1.0000 [IU] | Freq: Once | INTRA_ARTICULAR | 0 refills | Status: DC

## 2020-05-27 NOTE — Telephone Encounter (Signed)
Noted. Thanks.

## 2020-05-27 NOTE — Telephone Encounter (Signed)
Called and spoke with patient regarding this matter. He stated that he has already been in contact with the prescribing provider about this and they are aware. Patient was very appreciative of call and stated that he would follow up with this issue.

## 2020-05-28 ENCOUNTER — Other Ambulatory Visit: Payer: Self-pay | Admitting: Family Medicine

## 2020-05-29 ENCOUNTER — Other Ambulatory Visit: Payer: Self-pay | Admitting: Family Medicine

## 2020-05-31 ENCOUNTER — Telehealth: Payer: Self-pay

## 2020-05-31 NOTE — Telephone Encounter (Signed)
Please Advise

## 2020-05-31 NOTE — Telephone Encounter (Signed)
LVM for pt to return call to office or thru [facility] to let me know if he is needing a refill on torsemide or this is just a reorder from pharmacy.  Thanks  [employer]

## 2020-06-01 NOTE — Telephone Encounter (Signed)
Please verify with patient.  I thought this had been stopped due to his history of gastric bypass surgery.

## 2020-06-02 NOTE — Telephone Encounter (Signed)
Gabriel Rung, CMA  You Yesterday (9:12 AM)     Lorain Childes.Marland KitchenMarland KitchenMarland KitchenPt stated that he is not taking meloxicam anymore.   Message text

## 2020-06-08 ENCOUNTER — Other Ambulatory Visit: Payer: Self-pay

## 2020-06-08 ENCOUNTER — Encounter: Payer: BC Managed Care – PPO | Attending: General Surgery | Admitting: Skilled Nursing Facility1

## 2020-06-08 ENCOUNTER — Telehealth: Payer: Self-pay | Admitting: Family Medicine

## 2020-06-08 ENCOUNTER — Ambulatory Visit: Payer: BC Managed Care – PPO | Admitting: Family Medicine

## 2020-06-08 DIAGNOSIS — E1140 Type 2 diabetes mellitus with diabetic neuropathy, unspecified: Secondary | ICD-10-CM | POA: Insufficient documentation

## 2020-06-08 DIAGNOSIS — Z6841 Body Mass Index (BMI) 40.0 and over, adult: Secondary | ICD-10-CM | POA: Insufficient documentation

## 2020-06-08 NOTE — Progress Notes (Signed)
Bariatric Nutrition Follow-Up Visit Medical Nutrition Therapy   2 Months Post-Operative RYGB Surgery Surgery Date: 04/05/2020  NUTRITION ASSESSMENT    Anthropometrics  Surgery date: 04/05/2020 Surgery type: RYGB [facility] weight at [facility]: 399.7lbs Weight today: 348.3 lbs   Body Composition Scale Date  Total Body Fat % 41.6  Visceral Fat 43  Fat-Free Mass % 58.3   Total Body Water % 39.3   Muscle-Mass lbs 67.8  Body Fat Displacement          Torso  lbs 92.8         Left Leg  lbs 18.5         Right Leg  lbs 18.5         Left Arm  lbs 9.2         Right Arm   lbs 9.2    Clinical  Medical hx: diabetes Medications: no longer taking any insulin Labs: A1C 8.5 (novemeber 2021)   Lifestyle & Dietary Hx  Pt states he feels better if he does a protein shake in the morning. Pt state sugar free popcicles cause joy for him.  Pt states his blood sugars have been about 105-130 not needing to do any insulin.  Pt states eventually he will have his knees replaced. Pt states she is starting to get back into his travel routine again.   Estimated daily fluid intake: 80-100 oz Estimated daily protein intake: 80+ g Supplements: multi and calcium  Current average weekly physical activity: ADL's due to knee pain   24-Hr Dietary Recall First Meal: protein shake Snack: yogurt Second Meal: chicken or salmon  Snack: sugar free popcicle Third Meal: chicken or salmon Snack: peanuts Beverages: water, decaf unsweet tea  Post-Op Goals/ Signs/ Symptoms Using straws: no Drinking while eating: no Chewing/swallowing difficulties: no Changes in vision: no Changes to mood/headaches: no Hair loss/changes to skin/nails: no Difficulty focusing/concentrating: no Sweating: no Dizziness/lightheadedness: no Palpitations: no Carbonated/caffeinated beverages: no N/V/D/C/Gas: no Abdominal pain: no Dumping syndrome: no    NUTRITION DIAGNOSIS  Overweight/obesity ([place]-3.3) related to past poor dietary  habits and physical inactivity as evidenced by completed bariatric surgery and following dietary guidelines for continued weight loss and healthy nutrition status.     NUTRITION INTERVENTION Nutrition counseling (C-1) and education (E-2) to facilitate bariatric surgery goals, including: . Diet advancement to the next phase (phase 4) now including non starchy vegetables  . The importance of consuming adequate calories as well as certain nutrients daily due to the body's need for essential vitamins, minerals, and fats . The importance of daily physical activity and to reach a goal of at least 150 minutes of moderate to vigorous physical activity weekly (or as directed by their physician) due to benefits such as increased musculature and improved lab values . The importance of intuitive eating specifically learning hunger-satiety cues and understanding the importance of learning a new body: The importance of mindful eating to avoid grazing behaviors   Goals: -Continue to aim for a minimum of 64 fluid ounces 7 days a week with at least 30 ounces being plain water -Eat non-starchy vegetables 2 times a day 7 days a week -Start out with soft cooked vegetables today and tomorrow; if tolerated begin to eat raw vegetables or cooked including salads -Eat your 3 ounces of protein first then start in on your non-starchy vegetables; once you understand how much of your meal leads to satisfaction and not full while still eating 3 ounces of protein and non-starchy vegetables you can eat  them in any order  -Continue to aim for 30 minutes of activity at least 5 times a week -Do NOT cook with/add to your food: alfredo sauce, cheese sauce, barbeque sauce, ketchup, fat back, butter, bacon grease, grease, Crisco, OR SUGAR   Handouts Provided Include   Phase 4  Learning Style & Readiness for Change Teaching method utilized: Visual & Auditory  Demonstrated degree of understanding via: Teach Back  Readiness Level:  Action Barriers to learning/adherence to lifestyle change: none identified   RD's Notes for Next Visit . Assess adherence to pt chosen goals   MONITORING & EVALUATION Dietary intake, weekly physical activity, body weight  Next Steps Patient is to follow-up in 3-4 months

## 2020-06-08 NOTE — Telephone Encounter (Signed)
Rcvd call from pt that he had to pay up front copay for Zilretta to be shipped to him or [place] (he did & they will call us for shipping address confirmation)  --Estill Bamberg from [facility] [phone] called to verify office address & provider NPI # & to state Zilretta to be shipped out today after 5pm to [place]-( inquired if refrigeration needed, Yes) advised her it will need to be sent to our OP pharmacy same address on 1st floor.  ---ask that a tracking email be sent to my email address--can't promise-- per rep No ref # available just use pt's nm /DOB  [place] address: [address]  [place]

## 2020-06-09 ENCOUNTER — Ambulatory Visit: Payer: BC Managed Care – PPO | Admitting: Family Medicine

## 2020-06-09 ENCOUNTER — Ambulatory Visit: Payer: Self-pay

## 2020-06-09 ENCOUNTER — Other Ambulatory Visit: Payer: Self-pay

## 2020-06-09 DIAGNOSIS — M170 Bilateral primary osteoarthritis of knee: Secondary | ICD-10-CM

## 2020-06-09 NOTE — Assessment & Plan Note (Signed)
Zilretta injections performed bilaterally today.

## 2020-06-09 NOTE — Progress Notes (Signed)
Glenn Lyons - 65 y.o. male MRN [id number]  Date of birth: August 23, 1955  SUBJECTIVE:  Including CC & ROS.  No chief complaint on file.   Glenn Lyons is a 65 y.o. male that is presenting for his injections in each knee.   Review of Systems See HPI   HISTORY: Past Medical, Surgical, Social, and Family History Reviewed & Updated per EMR.   Pertinent Historical Findings include:  Past Medical History:  Diagnosis Date  . Achalasia    with prev eval at [facility].  No intervention as of 2012  . Aspiration pneumonia ([place]) 10/05/2014  . Backache, unspecified   . Cardiac pacemaker St. Jude    ERI 2008  . Depressive disorder, not elsewhere classified   . Diabetes ([place])   . Esophageal reflux   . Morbid obesity ([place])   . Obstructive sleep apnea    biapap settign 25-22  . Presence of permanent cardiac pacemaker    St. Jude- Dr. Caryl Comes follows -device Check 07-06-14  . Sinus bradycardia /pauses   . Stricture and stenosis of esophagus     Past Surgical History:  Procedure Laterality Date  . BALLOON DILATION N/A 10/06/2015   Procedure: BALLOON DILATION;  Surgeon: Mauri Pole, MD;  Location: [facility];  Service: Endoscopy;  Laterality: N/A;  . BIOPSY  05/06/2018   Procedure: BIOPSY;  Surgeon: Rush Landmark Telford Nab., MD;  Location: [place];  Service: Gastroenterology;;  . COLONOSCOPY W/ POLYPECTOMY  11/01/2010  . COLONOSCOPY WITH PROPOFOL N/A 10/05/2014   Procedure: COLONOSCOPY WITH PROPOFOL;  Surgeon: Gatha Mayer, MD;  [facility];  Service: Endoscopy;  Laterality: N/A;  . COLONOSCOPY WITH PROPOFOL N/A 05/06/2018   Procedure: COLONOSCOPY WITH PROPOFOL;  Surgeon: Rush Landmark Telford Nab., MD;  Location: [place];  Service: Gastroenterology;  Laterality: N/A;  . ESOPHAGEAL DILATION    . ESOPHAGEAL MANOMETRY N/A 08/23/2015   Procedure: ESOPHAGEAL MANOMETRY (EM);  Surgeon: Mauri Pole, MD;  [facility];  Service: Endoscopy;  Laterality: N/A;  .  ESOPHAGOGASTRODUODENOSCOPY N/A 10/09/2014   Procedure: ESOPHAGOGASTRODUODENOSCOPY (EGD);  Surgeon: Gatha Mayer, MD;  Location: [facility];  Service: Endoscopy;  Laterality: N/A;  . ESOPHAGOGASTRODUODENOSCOPY (EGD) WITH PROPOFOL N/A 08/23/2015   Procedure: ESOPHAGOGASTRODUODENOSCOPY (EGD) WITH PROPOFOL;  Surgeon: Mauri Pole, MD;  [facility];  Service: Endoscopy;  Laterality: N/A;  . ESOPHAGOGASTRODUODENOSCOPY (EGD) WITH PROPOFOL N/A 10/06/2015   Procedure: ESOPHAGOGASTRODUODENOSCOPY (EGD) WITH PROPOFOL;  Surgeon: Mauri Pole, MD;  Location: [facility];  Service: Endoscopy;  Laterality: N/A;  Rigiflex ballon size 40m-35mm size 45 minute proc,need Fluro Gastografin esophagram 2 hrs post EGD   . GASTRIC ROUX-EN-Y N/A 04/05/2020   Procedure: LAPAROSCOPIC ROUX-EN-Y GASTRIC BYPASS WITH UPPER ENDOSCOPY;  Surgeon: WGreer Pickerel MD;  [facility];  Service: General;  Laterality: N/A;  . PACEMAKER INSERTION    . POLYPECTOMY  05/06/2018   Procedure: POLYPECTOMY;  Surgeon: Mansouraty, GTelford Nab, MD;  [place]  Service: Gastroenterology;;  . PBetsy PriesGENERATOR CHANGEOUT N/A 05/01/2018   Procedure: PPM GENERATOR CHANGEOUT;  Surgeon: KDeboraha Sprang MD;  [place];  Service: Cardiovascular;  Laterality: N/A;    Family History  Problem Relation Age of Onset  . Heart attack Father   . Heart disease Father   . Cancer Father        multiple myeloma  . Hypertension Father   . Prostate cancer Father        possible dx  . Hypertension Mother   .  Dementia Mother   . Colon cancer Neg Hx     Social History   Socioeconomic History  . Marital status: Married    Spouse name: Not on file  . Number of children: 2  . Years of education: Not on file  . Highest education level: Not on file  Occupational History  . 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 Occupation: singer    Employer: [employer]  Tobacco Use  . Smoking status: Former Smoker     Packs/day: 1.00    Years: 20.00    Pack years: 20.00    Types: Cigarettes    Quit date: 05/29/2004    Years since quitting: 16.0  . Smokeless tobacco: Never Used  Vaping Use  . Vaping Use: Never used  Substance and Sexual Activity  . Alcohol use: Yes    Comment: rarely  . Drug use: No  . Sexual activity: Yes  Other Topics Concern  . Not on file  Social History Narrative   From Air cabin crew   Married   Social Determinants of Health   Financial Resource Strain: Not on file  Food Insecurity: Not on file  Transportation Needs: Not on file  Physical Activity: Not on file  Stress: Not on file  Social Connections: Not on file  Intimate Partner Violence: Not on file     PHYSICAL EXAM:  VS: There were no vitals taken for this visit. Physical Exam Gen: NAD, alert, cooperative with exam, well-appearing   Aspiration/Injection Procedure Note Glenn Lyons 03/16/1956  Procedure: Aspiration and Injection Indications: Right knee pain   Procedure Details Consent: Risks of procedure as well as the alternatives and risks of each were explained to the (patient/caregiver).  Consent for procedure obtained. Time Out: Verified patient identification, verified procedure, site/side was marked, verified correct patient position, special equipment/implants available, medications/allergies/relevent history reviewed, required imaging and test results available.  Performed.  The area was cleaned with iodine and alcohol swabs.    The right knee superior lateral suprapatellar pouch was injected using 4 cc's of 1% lidocaine with a 21 2" needle.  Aspiration was achieved.  The syringe was switched and a 5 mL of zilretta and 4 cc of 0.25% bupivacaine was injected. Ultrasound was used. Images were obtained in  Long views showing the injection.    Amount of Fluid Aspirated: 36m Character of Fluid: clear and straw colored Fluid was sent for:n/a A sterile dressing was applied.  Patient did  tolerate procedure well.  Aspiration/Injection Procedure Note Glenn MINSHALL2Jun 06, 1957 Procedure: Aspiration and Injection Indications: Left knee pain   Procedure Details Consent: Risks of procedure as well as the alternatives and risks of each were explained to the (patient/caregiver).  Consent for procedure obtained. Time Out: Verified patient identification, verified procedure, site/side was marked, verified correct patient position, special equipment/implants available, medications/allergies/relevent history reviewed, required imaging and test results available.  Performed.  The area was cleaned with iodine and alcohol swabs.    The left knee superior lateral suprapatellar pouch was injected using 4 cc's of 1% lidocaine with a 21 2" needle.  Aspiration was achieved.  The syringe was switched and a 5 mL of zilretta and 4 cc of 0.25% bupivacaine was injected. Ultrasound was used. Images were obtained in  Long views showing the injection.    Amount of Fluid Aspirated: 50 mL Character of Fluid: clear and straw colored Fluid was sent for:n/a A sterile dressing was applied.  Patient did tolerate procedure  well.  ASSESSMENT & PLAN:   OA (osteoarthritis) of knee Zilretta injections performed bilaterally today.

## 2020-06-09 NOTE — Patient Instructions (Signed)
Good to see you Please try ice as needed   Please send me a message in MyChart with any questions or updates.  Please see me back in 4 weeks or as needed if better.   --Dr. Anicka Stuckert  

## 2020-06-18 ENCOUNTER — Telehealth: Payer: Self-pay | Admitting: Emergency Medicine

## 2020-06-18 NOTE — Telephone Encounter (Signed)
Alert received forRA impedance of 1750 ohms. Reading has been gradually increasing over time with increase in RA threshold. Not atrial dependent , lead placed in 2006 ST Jude 1688TC RA lead. Patient out of town for work and will not be available for appointment until 07/06/20. Appointment scheduled and ED precautions given. Stressed importance of attending appointment to evaluate lead.

## 2020-06-28 NOTE — Telephone Encounter (Signed)
Noted  

## 2020-06-29 ENCOUNTER — Other Ambulatory Visit: Payer: Self-pay | Admitting: Family Medicine

## 2020-07-01 ENCOUNTER — Telehealth: Payer: Self-pay | Admitting: Internal Medicine

## 2020-07-01 ENCOUNTER — Telehealth: Payer: Self-pay | Admitting: Cardiology

## 2020-07-01 NOTE — Telephone Encounter (Signed)
Patient called and states he is unable to make his appt. 07/06/2020, he will be out town another week. Patient does not have his remote box with him, he will have his wife ship it to him. Advised patient that it is important he comes to device clinic to have lead tested. ED precautions given. Date, time and location of apt. Discussed with patient. Advised to call if he has further questions or concerns.

## 2020-07-01 NOTE — Telephone Encounter (Signed)
Patient is calling to reschedule his 07/06/2020 appointment with the device clinic, he states that he is out of town. Please advise.

## 2020-07-07 LAB — CUP PACEART REMOTE DEVICE CHECK
Battery Remaining Longevity: 124 mo
Battery Remaining Percentage: 95.5 %
Battery Voltage: 2.99 V
Brady Statistic AP VP Percent: 1 %
Brady Statistic AP VS Percent: 47 %
Brady Statistic AS VP Percent: 1 %
Brady Statistic AS VS Percent: 53 %
Brady Statistic RA Percent Paced: 45 %
Brady Statistic RV Percent Paced: 1 %
Date Time Interrogation Session: 20220209073644
Implantable Lead Implant Date: 20061219
Implantable Lead Implant Date: 20061219
Implantable Lead Location: 753859
Implantable Lead Location: 753860
Implantable Pulse Generator Implant Date: 20191204
Lead Channel Impedance Value: 1725 Ohm
Lead Channel Impedance Value: 480 Ohm
Lead Channel Pacing Threshold Amplitude: 1.125 V
Lead Channel Pacing Threshold Amplitude: 2.125 V
Lead Channel Pacing Threshold Pulse Width: 0.5 ms
Lead Channel Pacing Threshold Pulse Width: 0.5 ms
Lead Channel Sensing Intrinsic Amplitude: 5 mV
Lead Channel Sensing Intrinsic Amplitude: 8.6 mV
Lead Channel Setting Pacing Amplitude: 1.375
Lead Channel Setting Pacing Amplitude: 3.625
Lead Channel Setting Pacing Pulse Width: 0.5 ms
Lead Channel Setting Sensing Sensitivity: 2.5 mV
Pulse Gen Model: 2272
Pulse Gen Serial Number: 9090970

## 2020-07-08 ENCOUNTER — Ambulatory Visit (INDEPENDENT_AMBULATORY_CARE_PROVIDER_SITE_OTHER): Payer: BC Managed Care – PPO

## 2020-07-08 DIAGNOSIS — R001 Bradycardia, unspecified: Secondary | ICD-10-CM | POA: Diagnosis not present

## 2020-07-12 ENCOUNTER — Ambulatory Visit: Payer: BC Managed Care – PPO | Admitting: Family Medicine

## 2020-07-12 ENCOUNTER — Other Ambulatory Visit: Payer: Self-pay

## 2020-07-12 DIAGNOSIS — M170 Bilateral primary osteoarthritis of knee: Secondary | ICD-10-CM

## 2020-07-12 NOTE — Progress Notes (Signed)
Glenn Lyons - 65 y.o. male MRN [id number]  Date of birth: [birth year]  SUBJECTIVE:  Including CC & ROS.  No chief complaint on file.   Glenn Lyons is a 65 y.o. male that is following up for his bilateral knee pain.  We have tried several different forms of injections and braces and his pain still remains.  He continues to lose weight.   Review of Systems See HPI   HISTORY: Past Medical, Surgical, Social, and Family History Reviewed & Updated per EMR.   Pertinent Historical Findings include:  Past Medical History:  Diagnosis Date  . Achalasia    with prev eval at [facility].  No intervention as of 2012  . Aspiration pneumonia ([place]) 10/05/2014  . Backache, unspecified   . Cardiac pacemaker St. Jude    ERI 2008  . Depressive disorder, not elsewhere classified   . Diabetes ([place])   . Esophageal reflux   . Morbid obesity ([place])   . Obstructive sleep apnea    biapap settign 25-22  . Presence of permanent cardiac pacemaker    St. Jude- Dr. Caryl Comes follows -device Check 07-06-14  . Sinus bradycardia /pauses   . Stricture and stenosis of esophagus     Past Surgical History:  Procedure Laterality Date  . BALLOON DILATION N/A 10/06/2015   Procedure: BALLOON DILATION;  Surgeon: Mauri Pole, MD;  Location: [facility];  Service: Endoscopy;  Laterality: N/A;  . BIOPSY  05/06/2018   Procedure: BIOPSY;  Surgeon: Rush Landmark Telford Nab., MD;  Location: [place];  Service: Gastroenterology;;  . COLONOSCOPY W/ POLYPECTOMY  11/01/2010  . COLONOSCOPY WITH PROPOFOL N/A 10/05/2014   Procedure: COLONOSCOPY WITH PROPOFOL;  Surgeon: Gatha Mayer, MD;  [facility];  Service: Endoscopy;  Laterality: N/A;  . COLONOSCOPY WITH PROPOFOL N/A 05/06/2018   Procedure: COLONOSCOPY WITH PROPOFOL;  Surgeon: Rush Landmark Telford Nab., MD;  Location: [place];  Service: Gastroenterology;  Laterality: N/A;  . ESOPHAGEAL DILATION    . ESOPHAGEAL MANOMETRY N/A 08/23/2015   Procedure: ESOPHAGEAL MANOMETRY  (EM);  Surgeon: Mauri Pole, MD;  [facility];  Service: Endoscopy;  Laterality: N/A;  . ESOPHAGOGASTRODUODENOSCOPY N/A 10/09/2014   Procedure: ESOPHAGOGASTRODUODENOSCOPY (EGD);  Surgeon: Gatha Mayer, MD;  [facility];  Service: Endoscopy;  Laterality: N/A;  . ESOPHAGOGASTRODUODENOSCOPY (EGD) WITH PROPOFOL N/A 08/23/2015   Procedure: ESOPHAGOGASTRODUODENOSCOPY (EGD) WITH PROPOFOL;  Surgeon: Mauri Pole, MD;  [facility];  Service: Endoscopy;  Laterality: N/A;  . ESOPHAGOGASTRODUODENOSCOPY (EGD) WITH PROPOFOL N/A 10/06/2015   Procedure: ESOPHAGOGASTRODUODENOSCOPY (EGD) WITH PROPOFOL;  Surgeon: Mauri Pole, MD;  Location: [facility];  Service: Endoscopy;  Laterality: N/A;  Rigiflex ballon size 32m-35mm size 45 minute proc,need Fluro Gastografin esophagram 2 hrs post EGD   . GASTRIC ROUX-EN-Y N/A 04/05/2020   Procedure: LAPAROSCOPIC ROUX-EN-Y GASTRIC BYPASS WITH UPPER ENDOSCOPY;  Surgeon: WGreer Pickerel MD;  [facility];  Service: General;  Laterality: N/A;  . PACEMAKER INSERTION    . POLYPECTOMY  05/06/2018   Procedure: POLYPECTOMY;  Surgeon: Mansouraty, GTelford Nab, MD;  [place]  Service: Gastroenterology;;  . PBetsy PriesGENERATOR CHANGEOUT N/A 05/01/2018   Procedure: PPM GENERATOR CHANGEOUT;  Surgeon: KDeboraha Sprang MD;  [place] [facility];  Service: Cardiovascular;  Laterality: N/A;    Family History  Problem Relation Age of Onset  . Heart attack Father   . Heart disease Father   . Cancer Father        multiple myeloma  .  Hypertension Father   . Prostate cancer Father        possible dx  . Hypertension Mother   . Dementia Mother   . Colon cancer Neg Hx     Social History   Socioeconomic History  . Marital status: Married    Spouse name: Not on file  . Number of children: 2  . Years of education: Not on file  . Highest education level: Not on file  Occupational History  . 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 Occupation: singer    Employer: [employer]  Tobacco Use  . Smoking status: Former Smoker    Packs/day: 1.00    Years: 20.00    Pack years: 20.00    Types: Cigarettes    Quit date: 05/29/2004    Years since quitting: 16.1  . Smokeless tobacco: Never Used  Vaping Use  . Vaping Use: Never used  Substance and Sexual Activity  . Alcohol use: Yes    Comment: rarely  . Drug use: No  . Sexual activity: Yes  Other Topics Concern  . Not on file  Social History Narrative   From Air cabin crew   Married   Social Determinants of Health   Financial Resource Strain: Not on file  Food Insecurity: Not on file  Transportation Needs: Not on file  Physical Activity: Not on file  Stress: Not on file  Social Connections: Not on file  Intimate Partner Violence: Not on file     PHYSICAL EXAM:  VS: BP 126/76 (BP Location: Left Arm, Patient Position: Sitting, Cuff Size: Large)   Ht '5\' 11"'  (1.803 m)   Wt (!) 329 lb (149.2 kg)   BMI 45.89 kg/m  Physical Exam Gen: NAD, alert, cooperative with exam, well-appearing   ASSESSMENT & PLAN:   OA (osteoarthritis) of knee He still continues to lose weight with a goal of getting close to a BMI of 40 for the consideration of arthroplasty. -Counseled on home exercise therapy and supportive care. -Can repeat injections one at a time has passed

## 2020-07-12 NOTE — Patient Instructions (Signed)
Good to see you Please try ice as needed   Please send me a message in MyChart with any questions or updates.  Please see me back in a few months.   --Dr. Raeford Razor

## 2020-07-13 NOTE — Assessment & Plan Note (Signed)
He still continues to lose weight with a goal of getting close to a BMI of 40 for the consideration of arthroplasty. -Counseled on home exercise therapy and supportive care. -Can repeat injections one at a time has passed

## 2020-07-14 NOTE — Progress Notes (Signed)
Remote pacemaker transmission.   

## 2020-07-15 ENCOUNTER — Ambulatory Visit (INDEPENDENT_AMBULATORY_CARE_PROVIDER_SITE_OTHER): Payer: BC Managed Care – PPO | Admitting: Emergency Medicine

## 2020-07-15 ENCOUNTER — Other Ambulatory Visit: Payer: Self-pay

## 2020-07-15 ENCOUNTER — Ambulatory Visit: Payer: BC Managed Care – PPO | Admitting: Family Medicine

## 2020-07-15 DIAGNOSIS — R001 Bradycardia, unspecified: Secondary | ICD-10-CM

## 2020-07-15 LAB — CUP PACEART INCLINIC DEVICE CHECK
Battery Remaining Longevity: 124 mo
Battery Voltage: 2.99 V
Brady Statistic RA Percent Paced: 46 %
Brady Statistic RV Percent Paced: 0.23 %
Date Time Interrogation Session: 20220217103321
Implantable Lead Implant Date: 20061219
Implantable Lead Implant Date: 20061219
Implantable Lead Location: 753859
Implantable Lead Location: 753860
Implantable Pulse Generator Implant Date: 20191204
Lead Channel Impedance Value: 1700 Ohm
Lead Channel Impedance Value: 462.5 Ohm
Lead Channel Pacing Threshold Amplitude: 1.125 V
Lead Channel Pacing Threshold Amplitude: 1.75 V
Lead Channel Pacing Threshold Pulse Width: 0.5 ms
Lead Channel Pacing Threshold Pulse Width: 0.8 ms
Lead Channel Sensing Intrinsic Amplitude: 5 mV
Lead Channel Sensing Intrinsic Amplitude: 8.4 mV
Lead Channel Setting Pacing Amplitude: 1.375
Lead Channel Setting Pacing Amplitude: 3.375
Lead Channel Setting Pacing Pulse Width: 0.5 ms
Lead Channel Setting Sensing Sensitivity: 2.5 mV
Pulse Gen Model: 2272
Pulse Gen Serial Number: 9090970

## 2020-07-15 NOTE — Patient Instructions (Signed)
Please call with any questions or concerns.  [facility] [phone]

## 2020-07-15 NOTE — Progress Notes (Signed)
Patient seen in device clinic today to check elevated threshold in RA lead. RA lead checked Bipolar was 1.75 V 2 0.8 ms with impedance on 1700 ohms. When checked unipolar output was 0.5 V @ 0.5 ms impedance dropped to 280 ohns with battery longevity < 9 years. Patient left programmed Bipolar and and RA pulse width extended to 0.8 ms with a battery longevity of 8.6-10.4 years. Industry rep Dionne Milo was present.   Per Dionne Milo, impedance trends has gradually increased but are stable. Initial impedance was 1075 Bipolar. Today's impedance was 1700 ohms.     Routing to Dr. Caryl Comes for review.

## 2020-07-22 ENCOUNTER — Other Ambulatory Visit: Payer: Self-pay

## 2020-07-22 ENCOUNTER — Ambulatory Visit: Payer: BC Managed Care – PPO | Admitting: Family Medicine

## 2020-07-22 ENCOUNTER — Encounter: Payer: Self-pay | Admitting: Family Medicine

## 2020-07-22 VITALS — BP 130/78 | HR 61 | Temp 97.8°F | Ht 71.0 in | Wt 336.0 lb

## 2020-07-22 DIAGNOSIS — E1140 Type 2 diabetes mellitus with diabetic neuropathy, unspecified: Secondary | ICD-10-CM

## 2020-07-22 LAB — POCT GLYCOSYLATED HEMOGLOBIN (HGB A1C): Hemoglobin A1C: 6.6 % — AB (ref 4.0–5.6)

## 2020-07-22 NOTE — Progress Notes (Signed)
This visit occurred during the SARS-CoV-2 public health emergency.  Safety protocols were in place, including screening questions prior to the visit, additional usage of staff PPE, and extensive cleaning of exam room while observing appropriate contact time as indicated for disinfecting solutions.  Weight loss after gastric surgery.  He clearly feels better.  His relationship with food is different now, d/w pt.    He is travelling with work, [place], [place], [place], Social research officer, government.  D/w pt.   He is more limited by knee pain, more than anything else.  He had knee injection that helped with stiffness but not pain.  He is considering replacement but needs weight loss to get set for that, d/w pt.  He is using a walker at baseline, when needed.    DM2.  A1c 6.6, d/w pt.  No meds.  See above.  No statin or ACE or DM meds at this point.  Would still defer at this point with likely pending A1c dec.  He agrees.    Meds, vitals, and allergies reviewed.   ROS: Per HPI unless specifically indicated in ROS section   GEN: nad, alert and oriented HEENT: ncat NECK: supple w/o LA CV: rrr PULM: ctab, no inc wob ABD: soft, +bs EXT: no edema SKIN: no acute rash

## 2020-07-22 NOTE — Patient Instructions (Signed)
Recheck in about 3 months. A1c at the visit.  Take care.  Glad to see you. Thanks for your effort.

## 2020-07-25 NOTE — Assessment & Plan Note (Signed)
Weight loss after gastric surgery.  He clearly feels better.  His relationship with food is different now, d/w pt.  A1c 6.6, d/w pt.  No meds.  See above.  No statin or ACE or DM meds at this point.  Would still defer at this point with likely pending A1c dec.  He agrees.  Recheck periodically.  He agrees.

## 2020-07-30 ENCOUNTER — Encounter: Payer: Self-pay | Admitting: Family Medicine

## 2020-08-12 ENCOUNTER — Telehealth: Payer: Self-pay | Admitting: Family Medicine

## 2020-08-12 ENCOUNTER — Other Ambulatory Visit: Payer: Self-pay | Admitting: *Deleted

## 2020-08-12 MED ORDER — ZILRETTA 32 MG IX SRER: 32.0000 mg | Freq: Once | INTRA_ARTICULAR | 0 refills | Status: AC

## 2020-08-12 NOTE — Telephone Encounter (Signed)
Patient called w/ [facility] contact information -- P) [phone] F) [phone]  glh

## 2020-08-12 NOTE — Telephone Encounter (Signed)
----   Pt called states he is ready for his next injection of Zilretta (he has spkn with pharmacy ) they advised him Doctor will have to order (send Rx ).  -- Pt says since he has paid his calender yr Deductible Ins should cover injectable & he should be responsible for copays.  --Forwarding request to [place].  --glh

## 2020-08-26 ENCOUNTER — Other Ambulatory Visit: Payer: Self-pay | Admitting: *Deleted

## 2020-08-26 MED ORDER — ZILRETTA 32 MG IX SRER: 1.0000 [IU] | Freq: Once | INTRA_ARTICULAR | 0 refills | Status: AC

## 2020-09-06 ENCOUNTER — Ambulatory Visit: Payer: Self-pay

## 2020-09-06 ENCOUNTER — Ambulatory Visit: Payer: BC Managed Care – PPO | Admitting: Family Medicine

## 2020-09-06 ENCOUNTER — Encounter: Payer: Self-pay | Admitting: Family Medicine

## 2020-09-06 ENCOUNTER — Other Ambulatory Visit: Payer: Self-pay

## 2020-09-06 ENCOUNTER — Ambulatory Visit: Payer: BC Managed Care – PPO | Admitting: Skilled Nursing Facility1

## 2020-09-06 VITALS — BP 134/70 | Ht 71.0 in | Wt 336.0 lb

## 2020-09-06 DIAGNOSIS — M170 Bilateral primary osteoarthritis of knee: Secondary | ICD-10-CM

## 2020-09-06 NOTE — Assessment & Plan Note (Signed)
Acute on chronic in nature. -Bilateral Zilretta injections today.

## 2020-09-06 NOTE — Patient Instructions (Signed)
Good to see you Please try ice as needed   Please send me a message in MyChart with any questions or updates.  Please see me back in 3 months.   --Dr. Adreonna Yontz  

## 2020-09-06 NOTE — Progress Notes (Signed)
Glenn Lyons - 65 y.o. male MRN [id number]  Date of birth: 01/17/1956  SUBJECTIVE:  Including CC & ROS.  No chief complaint on file.   Glenn Lyons is a 65 y.o. male that is presenting for bilateral injections today.   Review of Systems See HPI   HISTORY: Past Medical, Surgical, Social, and Family History Reviewed & Updated per EMR.   Pertinent Historical Findings include:  Past Medical History:  Diagnosis Date  . Achalasia    with prev eval at [facility].  No intervention as of 2012  . Aspiration pneumonia ([place]) 10/05/2014  . Backache, unspecified   . Cardiac pacemaker St. Jude    ERI 2008  . Depressive disorder, not elsewhere classified   . Diabetes ([place])   . Esophageal reflux   . Morbid obesity ([place])   . Obstructive sleep apnea    biapap settign 25-22  . Presence of permanent cardiac pacemaker    St. Jude- Dr. Caryl Comes follows -device Check 07-06-14  . Sinus bradycardia /pauses   . Stricture and stenosis of esophagus     Past Surgical History:  Procedure Laterality Date  . BALLOON DILATION N/A 10/06/2015   Procedure: BALLOON DILATION;  Surgeon: Mauri Pole, MD;  Location: [facility];  Service: Endoscopy;  Laterality: N/A;  . BIOPSY  05/06/2018   Procedure: BIOPSY;  Surgeon: Rush Landmark Telford Nab., MD;  Location: [place];  Service: Gastroenterology;;  . COLONOSCOPY W/ POLYPECTOMY  11/01/2010  . COLONOSCOPY WITH PROPOFOL N/A 10/05/2014   Procedure: COLONOSCOPY WITH PROPOFOL;  Surgeon: Gatha Mayer, MD;  Location: [facility];  Service: Endoscopy;  Laterality: N/A;  . COLONOSCOPY WITH PROPOFOL N/A 05/06/2018   Procedure: COLONOSCOPY WITH PROPOFOL;  Surgeon: Rush Landmark Telford Nab., MD;  Location: [facility];  Service: Gastroenterology;  Laterality: N/A;  . ESOPHAGEAL DILATION    . ESOPHAGEAL MANOMETRY N/A 08/23/2015   Procedure: ESOPHAGEAL MANOMETRY (EM);  Surgeon: Mauri Pole, MD;  Location: [facility];  Service: Endoscopy;  Laterality: N/A;  .  ESOPHAGOGASTRODUODENOSCOPY N/A 10/09/2014   Procedure: ESOPHAGOGASTRODUODENOSCOPY (EGD);  Surgeon: Gatha Mayer, MD;  Location: [facility];  Service: Endoscopy;  Laterality: N/A;  . ESOPHAGOGASTRODUODENOSCOPY (EGD) WITH PROPOFOL N/A 08/23/2015   Procedure: ESOPHAGOGASTRODUODENOSCOPY (EGD) WITH PROPOFOL;  Surgeon: Mauri Pole, MD;  [facility];  Service: Endoscopy;  Laterality: N/A;  . ESOPHAGOGASTRODUODENOSCOPY (EGD) WITH PROPOFOL N/A 10/06/2015   Procedure: ESOPHAGOGASTRODUODENOSCOPY (EGD) WITH PROPOFOL;  Surgeon: Mauri Pole, MD;  Location: [facility];  Service: Endoscopy;  Laterality: N/A;  Rigiflex ballon size 17m-35mm size 45 minute proc,need Fluro Gastografin esophagram 2 hrs post EGD   . GASTRIC ROUX-EN-Y N/A 04/05/2020   Procedure: LAPAROSCOPIC ROUX-EN-Y GASTRIC BYPASS WITH UPPER ENDOSCOPY;  Surgeon: WGreer Pickerel MD;  Location: [facility];  Service: General;  Laterality: N/A;  . PACEMAKER INSERTION    . POLYPECTOMY  05/06/2018   Procedure: POLYPECTOMY;  Surgeon: Mansouraty, GTelford Nab, MD;  Location: [facility]  Service: Gastroenterology;;  . PBetsy PriesGENERATOR CHANGEOUT N/A 05/01/2018   Procedure: PPM GENERATOR CHANGEOUT;  Surgeon: KDeboraha Sprang MD;  [facility];  Service: Cardiovascular;  Laterality: N/A;    Family History  Problem Relation Age of Onset  . Heart attack Father   . Heart disease Father   . Cancer Father        multiple myeloma  . Hypertension Father   . Prostate cancer Father        possible dx  . Hypertension Mother   .  Dementia Mother   . Colon cancer Neg Hx     Social History   Socioeconomic History  . Marital status: Married    Spouse name: Not on file  . Number of children: 2  . Years of education: Not on file  . Highest education level: Not on file  Occupational History  . 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 Occupation: singer    Employer: [employer]  Tobacco Use  . Smoking status: Former Smoker     Packs/day: 1.00    Years: 20.00    Pack years: 20.00    Types: Cigarettes    Quit date: 05/29/2004    Years since quitting: 16.2  . Smokeless tobacco: Never Used  Vaping Use  . Vaping Use: Never used  Substance and Sexual Activity  . Alcohol use: Yes    Comment: rarely  . Drug use: No  . Sexual activity: Yes  Other Topics Concern  . Not on file  Social History Narrative   From Air cabin crew   Married   Social Determinants of Health   Financial Resource Strain: Not on file  Food Insecurity: Not on file  Transportation Needs: Not on file  Physical Activity: Not on file  Stress: Not on file  Social Connections: Not on file  Intimate Partner Violence: Not on file     PHYSICAL EXAM:  VS: BP 134/70 (BP Location: Left Arm, Patient Position: Sitting, Cuff Size: Large)   Ht '5\' 11"'  (1.803 m)   Wt (!) 336 lb (152.4 kg)   BMI 46.86 kg/m  Physical Exam Gen: NAD, alert, cooperative with exam, well-appearing   Aspiration/Injection Procedure Note Glenn Lyons 02-26-1956  Procedure: Injection Indications: Right knee pain  Procedure Details Consent: Risks of procedure as well as the alternatives and risks of each were explained to the (patient/caregiver).  Consent for procedure obtained. Time Out: Verified patient identification, verified procedure, site/side was marked, verified correct patient position, special equipment/implants available, medications/allergies/relevent history reviewed, required imaging and test results available.  Performed.  The area was cleaned with iodine and alcohol swabs.    The right knee superior lateral suprapatellar pouch was injected using 4 cc of 1% lidocaine on a 21-gauge 2 inch needle.  The syringe was switched and a mixture containing 5 cc's of 32 mg Zilretta and 3 cc's of 0.25% bupivacaine was injected.  Ultrasound was used. Images were obtained in long views showing the injection.     A sterile dressing was applied.  Patient did  tolerate procedure well.  Aspiration/Injection Procedure Note Glenn Lyons May 20, 1956  Procedure: Injection Indications: Left knee pain  Procedure Details Consent: Risks of procedure as well as the alternatives and risks of each were explained to the (patient/caregiver).  Consent for procedure obtained. Time Out: Verified patient identification, verified procedure, site/side was marked, verified correct patient position, special equipment/implants available, medications/allergies/relevent history reviewed, required imaging and test results available.  Performed.  The area was cleaned with iodine and alcohol swabs.    The left knee superior lateral suprapatellar pouch was injected using 4 cc of 1% lidocaine on a 21-gauge 2 inch needle.  An 18-gauge 1-1/2 inch needle was used to achieve aspiration. The syringe was switched and a mixture containing 5 cc's of 32 mg Zilretta and 3 cc's of 0.25% bupivacaine was injected.  Ultrasound was used. Images were obtained in long views showing the injection.    Amount of Fluid Aspirated: 67m Character of Fluid: clear and straw colored Fluid  was sent for:n/a  A sterile dressing was applied.  Patient did tolerate procedure well.    ASSESSMENT & PLAN:   OA (osteoarthritis) of knee Acute on chronic in nature. -Bilateral Zilretta injections today.

## 2020-10-04 ENCOUNTER — Other Ambulatory Visit: Payer: Self-pay | Admitting: Family Medicine

## 2020-10-04 ENCOUNTER — Telehealth: Payer: Self-pay | Admitting: Family Medicine

## 2020-10-04 MED ORDER — TRAMADOL HCL 50 MG PO TABS: 50.0000 mg | ORAL_TABLET | Freq: Two times a day (BID) | ORAL | 0 refills | Status: DC | PRN

## 2020-10-04 NOTE — Telephone Encounter (Signed)
Provided tramadol.   Rosemarie Ax, MD [facility] 10/04/2020, 11:57 AM

## 2020-10-04 NOTE — Telephone Encounter (Signed)
  Patient called request to be put back on this medication says it greatly helps w/ his knee pain(since unable to take Ibuprofen  traMADol (ULTRAM) 50 MG tablet [[id number]] DISCONTINUED  Order Details Dose: 50 mg Route: Oral Frequency: Every 6 hours PRN for pain  Dispense Quantity: 10 tablet Refills: 0       Sig: Take 1 tablet (50 mg total) by mouth every 6 (six) hours as needed (pain).      Start Date: 04/06/20 End Date: 06/09/20  Discontinued by: Rosemarie Ax, MD on 06/09/2020 13:18   -Pt uses :  [id number] [address], [place] [address]  [place], [place] [address]  Phone:  [phone] Fax:  [phone]   --glh

## 2020-10-05 ENCOUNTER — Other Ambulatory Visit: Payer: Self-pay

## 2020-10-05 ENCOUNTER — Encounter: Payer: BC Managed Care – PPO | Attending: General Surgery | Admitting: Skilled Nursing Facility1

## 2020-10-05 DIAGNOSIS — E1140 Type 2 diabetes mellitus with diabetic neuropathy, unspecified: Secondary | ICD-10-CM | POA: Insufficient documentation

## 2020-10-05 DIAGNOSIS — Z6841 Body Mass Index (BMI) 40.0 and over, adult: Secondary | ICD-10-CM | POA: Diagnosis present

## 2020-10-05 NOTE — Progress Notes (Signed)
Bariatric Nutrition Follow-Up Visit Medical Nutrition Therapy   Post-Operative RYGB Surgery Surgery Date: 04/05/2020  NUTRITION ASSESSMENT  Anthropometrics  Surgery date: 04/05/2020 Surgery type: RYGB Start weight at [facility]: 399.7lbs Weight today: 312.4 lbs; 38.1% body fat, TBW% 42.8   Body Composition Scale Date  Total Body Fat % 41.6  Visceral Fat 43  Fat-Free Mass % 58.3   Total Body Water % 39.3   Muscle-Mass lbs 67.8  Body Fat Displacement          Torso  lbs 92.8         Left Leg  lbs 18.5         Right Leg  lbs 18.5         Left Arm  lbs 9.2         Right Arm   lbs 9.2    Clinical  Medical hx: diabetes Medications: no longer taking any insulin Labs: A1C 8.5 (novemeber 2021)   Lifestyle & Dietary Hx  Pt states he feels better if he does a protein shake in the morning. Pt state sugar free popcicles cause joy for him.  Pt states eventually he will have his knees replaced. Pt states he has enjoyed his traveling. Pt states he has had trouble getting in the non starchy vegetables because he does not like them and he has been traveling. Pt states she notices he eats too fast and does not chew well.  Pt states he does not have a taste for shrimp or fish anymore.  Pt states he has been eating oatmeal (recognizes it is outside of the phase). Pt sates he needs to eat oatmeal in the morning to tolerate foods better in the day.  Pt states he does not eat unless he is hungry mostly snacking: Dietitian educated pt on this behavior.  Pt has added in complex carbohydrates (understands these foods were outside of the phases). Pt states he would eat too much food or drink with meals or eat too fast causing stomach issues so now choosing carbohydrates because he knows hey will not hurt his stomach and to soothe it.  Pt states he is proud of himself for the success he has achieved so far.  Pt states he is disappointed his weight loss did not help with his knee pain and feels the weight loss  has made his knee pain worse.  Pt states he does not do arm chair exercise and states he travels a lot so that will not be feasible.   Pt states his blood sugars have been about 105-135, 140. Pt states he has needed his insulin a couple times but not regularly.   Estimated daily fluid intake: 80-100 oz Estimated daily protein intake: 80+ g Supplements: soft chew multi (without iron) and calcium  Current average weekly physical activity: ADL's due to knee pain   24-Hr Dietary Recall: banana every other day First Meal: oatmeal (adds butter)  + protein shake Snack: yogurt or peanuts or protein shake Second Meal: chicken or salmon  Snack: banana Third Meal: chicken or salmon Snack: peanuts Beverages: water, decaf unsweet tea, fruit smoothie, [employer] cold coffee  Post-Op Goals/ Signs/ Symptoms Using straws: no Drinking while eating: no Chewing/swallowing difficulties: no Changes in vision: no Changes to mood/headaches: no Hair loss/changes to skin/nails: no Difficulty focusing/concentrating: no Sweating: no Dizziness/lightheadedness: no Palpitations: no Carbonated/caffeinated beverages: no N/V/D/C/Gas: no Abdominal pain: no Dumping syndrome: no    NUTRITION DIAGNOSIS  Overweight/obesity ([place]-3.3) related to past poor dietary habits and  physical inactivity as evidenced by completed bariatric surgery and following dietary guidelines for continued weight loss and healthy nutrition status.     NUTRITION INTERVENTION Nutrition counseling (C-1) and education (E-2) to facilitate bariatric surgery goals, including: . The importance of consuming appropriate calories as well as certain nutrients daily due to the body's need for essential vitamins, minerals, and fats . The importance of daily physical activity and to reach a goal of at least 150 minutes of moderate to vigorous physical activity weekly: aiming to be as mobile as possible (or as directed by their physician) due to  benefits such as increased musculature and improved lab values . The importance of intuitive eating specifically learning hunger-satiety cues and understanding the importance of learning a new body: The importance of mindful eating to avoid grazing behaviors  . Avoidance of grazing behavior . 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 Assess adherence to pt chosen goals   MONITORING & EVALUATION Dietary intake, weekly physical activity, body weight  Next Steps Patient is to follow-up in 3-4 months

## 2020-10-07 ENCOUNTER — Ambulatory Visit (INDEPENDENT_AMBULATORY_CARE_PROVIDER_SITE_OTHER): Payer: BC Managed Care – PPO

## 2020-10-07 DIAGNOSIS — Z950 Presence of cardiac pacemaker: Secondary | ICD-10-CM | POA: Diagnosis not present

## 2020-10-08 ENCOUNTER — Telehealth: Payer: Self-pay | Admitting: Cardiology

## 2020-10-08 NOTE — Telephone Encounter (Signed)
I have called patient back to reassure him its fine he can send that transmission when he gets back in town. Patient is feeling fine.

## 2020-10-08 NOTE — Telephone Encounter (Signed)
Glenn Lyons is calling stating he is out of town and this is the reason for the missed transmission on 10/07/20. He states he received a call from the office requesting he send it in manually, but he is still out of town and will not be back until 10/13/20. He states he is aware we ask if you will be traveling away from home for more than 7 days to bring your monitor with you, but he completely forgot to do so. He is requesting a callback wanting to know if this is something he needs to schedule due to it not being sent until next week or if he should still send it manually when he gets back. Please advise.

## 2020-10-13 LAB — CUP PACEART REMOTE DEVICE CHECK
Battery Remaining Longevity: 131 mo
Battery Remaining Percentage: 95.5 %
Battery Voltage: 3.01 V
Brady Statistic AP VP Percent: 1 %
Brady Statistic AP VS Percent: 60 %
Brady Statistic AS VP Percent: 1 %
Brady Statistic AS VS Percent: 39 %
Brady Statistic RA Percent Paced: 59 %
Brady Statistic RV Percent Paced: 1 %
Date Time Interrogation Session: 20220518015025
Implantable Lead Implant Date: 20061219
Implantable Lead Implant Date: 20061219
Implantable Lead Location: 753859
Implantable Lead Location: 753860
Implantable Pulse Generator Implant Date: 20191204
Lead Channel Impedance Value: 1675 Ohm
Lead Channel Impedance Value: 460 Ohm
Lead Channel Pacing Threshold Amplitude: 1.125 V
Lead Channel Pacing Threshold Amplitude: 1.625 V
Lead Channel Pacing Threshold Pulse Width: 0.5 ms
Lead Channel Pacing Threshold Pulse Width: 0.8 ms
Lead Channel Sensing Intrinsic Amplitude: 5 mV
Lead Channel Sensing Intrinsic Amplitude: 7.6 mV
Lead Channel Setting Pacing Amplitude: 1.375
Lead Channel Setting Pacing Amplitude: 3.125
Lead Channel Setting Pacing Pulse Width: 0.5 ms
Lead Channel Setting Sensing Sensitivity: 2.5 mV
Pulse Gen Model: 2272
Pulse Gen Serial Number: 9090970

## 2020-10-29 NOTE — Progress Notes (Signed)
Remote pacemaker transmission.   

## 2020-11-15 ENCOUNTER — Telehealth: Payer: Self-pay | Admitting: Family Medicine

## 2020-11-15 NOTE — Telephone Encounter (Incomplete)
Patient called, states he will be here next week for only several days then heading back out to [place] til August --Needs his next Zilretta injection .  --Pt ask provider to send Rx to [place] for so it will be here possibly next week.

## 2020-11-17 ENCOUNTER — Other Ambulatory Visit: Payer: Self-pay | Admitting: *Deleted

## 2020-11-17 MED ORDER — ZILRETTA 32 MG IX SRER: INTRA_ARTICULAR | 0 refills | Status: DC

## 2020-11-19 ENCOUNTER — Other Ambulatory Visit: Payer: Self-pay

## 2020-11-19 ENCOUNTER — Emergency Department (HOSPITAL_COMMUNITY): Payer: BC Managed Care – PPO

## 2020-11-19 ENCOUNTER — Emergency Department (HOSPITAL_COMMUNITY)
Admission: EM | Admit: 2020-11-19 | Discharge: 2020-11-20 | Disposition: A | Discharge status: Home / Self Care | Payer: BC Managed Care – PPO | Attending: Emergency Medicine | Admitting: Emergency Medicine

## 2020-11-19 DIAGNOSIS — Z79899 Other long term (current) drug therapy: Secondary | ICD-10-CM | POA: Insufficient documentation

## 2020-11-19 DIAGNOSIS — E1140 Type 2 diabetes mellitus with diabetic neuropathy, unspecified: Secondary | ICD-10-CM | POA: Diagnosis not present

## 2020-11-19 DIAGNOSIS — Z950 Presence of cardiac pacemaker: Secondary | ICD-10-CM | POA: Insufficient documentation

## 2020-11-19 DIAGNOSIS — M542 Cervicalgia: Secondary | ICD-10-CM | POA: Diagnosis not present

## 2020-11-19 DIAGNOSIS — Z20822 Contact with and (suspected) exposure to covid-19: Secondary | ICD-10-CM | POA: Insufficient documentation

## 2020-11-19 DIAGNOSIS — Z87891 Personal history of nicotine dependence: Secondary | ICD-10-CM | POA: Diagnosis not present

## 2020-11-19 DIAGNOSIS — I10 Essential (primary) hypertension: Secondary | ICD-10-CM | POA: Diagnosis not present

## 2020-11-19 DIAGNOSIS — M545 Low back pain, unspecified: Secondary | ICD-10-CM | POA: Diagnosis not present

## 2020-11-19 DIAGNOSIS — R55 Syncope and collapse: Secondary | ICD-10-CM | POA: Insufficient documentation

## 2020-11-19 DIAGNOSIS — R519 Headache, unspecified: Secondary | ICD-10-CM | POA: Diagnosis not present

## 2020-11-19 DIAGNOSIS — M549 Dorsalgia, unspecified: Secondary | ICD-10-CM

## 2020-11-19 DIAGNOSIS — M546 Pain in thoracic spine: Secondary | ICD-10-CM | POA: Insufficient documentation

## 2020-11-19 DIAGNOSIS — R531 Weakness: Secondary | ICD-10-CM | POA: Diagnosis not present

## 2020-11-19 LAB — COMPREHENSIVE METABOLIC PANEL
ALT: 26 U/L (ref 0–44)
AST: 25 U/L (ref 15–41)
Albumin: 3.7 g/dL (ref 3.5–5.0)
Alkaline Phosphatase: 82 U/L (ref 38–126)
Anion gap: 7 (ref 5–15)
BUN: 8 mg/dL (ref 8–23)
CO2: 24 mmol/L (ref 22–32)
Calcium: 9.6 mg/dL (ref 8.9–10.3)
Chloride: 106 mmol/L (ref 98–111)
Creatinine, Ser: 0.78 mg/dL (ref 0.61–1.24)
GFR, Estimated: >60 mL/min (ref >60)
Glucose, Bld: 127 mg/dL — ABNORMAL HIGH (ref 70–99)
Potassium: 3.7 mmol/L (ref 3.5–5.1)
Sodium: 137 mmol/L (ref 135–145)
Total Bilirubin: 0.7 mg/dL (ref 0.3–1.2)
Total Protein: 6.3 g/dL — ABNORMAL LOW (ref 6.5–8.1)

## 2020-11-19 LAB — CBC WITH DIFFERENTIAL/PLATELET
Abs Immature Granulocytes: 0.06 10*3/uL (ref 0.00–0.07)
Basophils Absolute: 0.1 10*3/uL (ref 0.0–0.1)
Basophils Relative: 0 %
Eosinophils Absolute: 0.2 10*3/uL (ref 0.0–0.5)
Eosinophils Relative: 1 %
HCT: 46.8 % (ref 39.0–52.0)
Hemoglobin: 15.4 g/dL (ref 13.0–17.0)
Immature Granulocytes: 0 %
Lymphocytes Relative: 7 %
Lymphs Abs: 1.2 10*3/uL (ref 0.7–4.0)
MCH: 29.8 pg (ref 26.0–34.0)
MCHC: 32.9 g/dL (ref 30.0–36.0)
MCV: 90.5 fL (ref 80.0–100.0)
Monocytes Absolute: 0.9 10*3/uL (ref 0.1–1.0)
Monocytes Relative: 6 %
Neutro Abs: 14.3 10*3/uL — ABNORMAL HIGH (ref 1.7–7.7)
Neutrophils Relative %: 86 %
Platelets: 252 10*3/uL (ref 150–400)
RBC: 5.17 MIL/uL (ref 4.22–5.81)
RDW: 13.5 % (ref 11.5–15.5)
WBC: 16.7 10*3/uL — ABNORMAL HIGH (ref 4.0–10.5)
nRBC: 0 % (ref 0.0–0.2)

## 2020-11-19 LAB — CBG MONITORING, ED: Glucose-Capillary: 156 mg/dL — ABNORMAL HIGH (ref 70–99)

## 2020-11-19 LAB — TROPONIN I (HIGH SENSITIVITY): Troponin I (High Sensitivity): 12 ng/L (ref <18)

## 2020-11-19 MED ORDER — FENTANYL CITRATE (PF) 100 MCG/2ML IJ SOLN
50.0000 ug | Freq: Once | INTRAMUSCULAR | Status: AC
  Administered 2020-11-20: 50 ug via INTRAVENOUS
  Filled 2020-11-19: qty 2

## 2020-11-19 NOTE — ED Notes (Signed)
Pt found by NT to be unresponsive during EKG, pale & diaphoretic. EKG taken to [facility], ruled not STEMI. PA Shari requesting room for pt ASAP, charge made aware

## 2020-11-19 NOTE — ED Provider Notes (Signed)
[facility] EMERGENCY DEPARTMENT Provider Note  CSN: [id number] Arrival date & time: 11/19/20 2208  Chief Complaint(s) Back Pain  HPI Glenn Lyons is a 65 y.o. male    Back Pain Location:  Thoracic spine Quality:  Stabbing Radiates to: head, neck. Pain severity:  Severe Onset quality:  Sudden Duration:  2 hours Timing:  Constant Progression:  Improving Chronicity:  New Relieved by: hot shower. Worsened by:  Nothing Associated symptoms: headaches and weakness (generalized)   Associated symptoms: no abdominal pain, no chest pain, no fever, no leg pain, no numbness and no perianal numbness    Past Medical History Past Medical History:  Diagnosis Date   Achalasia    with prev eval at [facility].  No intervention as of 2012   Aspiration pneumonia ([place]) 10/05/2014   Backache, unspecified    Cardiac pacemaker St. Jude    ERI 2008   Depressive disorder, not elsewhere classified    Diabetes ([place])    Esophageal reflux    Morbid obesity ([place])    Obstructive sleep apnea    biapap settign 25-22   Presence of permanent cardiac pacemaker    [employer]. Jude- Dr. Caryl Comes follows -device Check 07-06-14   Sinus bradycardia /pauses    Stricture and stenosis of esophagus    Patient Active Problem List   Diagnosis Date Noted   S/P gastric bypass 04/05/2020   Need for shingles vaccine 01/28/2020   Foreskin problem 08/10/2019   HLD (hyperlipidemia) 08/05/2017   Routine general medical examination at a health care facility 07/13/2016   Advance care planning 07/13/2016   Type 2 diabetes mellitus with diabetic neuropathy, unspecified ([place]) 03/17/2016   Lower urinary tract symptoms (LUTS) 03/16/2016   Dysphagia    Essential hypertension    Hx of colonic polyps    Fatigue 08/04/2014   Rash and nonspecific skin eruption 10/21/2013   Morbid obesity with BMI of 50.0-59.9, adult ([place]) 05/24/2013   History of colonic polyps 04/29/2013   Anxiety state 03/10/2013   Pain in joint,  shoulder region 02/08/2013   Sinus bradycardia /pauses    ED (erectile dysfunction) 02/21/2012   Edema 01/31/2012   Skin lesion 01/08/2012   OA (osteoarthritis) of knee 06/15/2011   Paresthesia 01/08/2011   Achalasia 08/17/2010   DEPRESSION 01/21/2010   OSTEOARTHRITIS 01/21/2010   ANGINA, HX OF 01/21/2010   PACEMAKER, [employer] Judes 01/21/2010   BACK PAIN 01/05/2010   Obesity 12/07/2009   GERD 12/07/2009   OSA (obstructive sleep apnea) 12/07/2009   Home Medication(s) Prior to Admission medications   Medication Sig Start Date End Date Taking? Authorizing Provider  Acetaminophen (TYLENOL DISSOLVE PACKS PO) Take 1 packet by mouth 2 (two) times daily as needed (pain).   Yes [provider]  Blood Glucose Monitoring Suppl (ONETOUCH VERIO) w/Device KIT Inject 1 kit into the skin every morning. Use to test blood sugar each day before breakfast and 2 hours after a meal for 2 weeks.  Diagnosis:  E11.9  Non-insulin dependent. 06/13/16  Yes Tonia Ghent, MD  Calcium Carbonate (CALCIUM 500 PO) Take 1,500 mg by mouth daily.   Yes [provider]  Lancets (ONETOUCH ULTRASOFT) lancets USE TO TEST BLOOD SUGAR ONCE DAILY OR AS NEEDED 03/12/17  Yes Tonia Ghent, MD  Multiple Vitamins-Minerals (BARIATRIC MULTIVITAMINS/IRON PO) Take 2 tablets by mouth daily.   Yes [provider]  ONETOUCH VERIO test strip USE TO TEST BLOOD SUGAR ONCE DAILY OR AS INSTRUCTED 10/04/20  Yes Elsie Stain  S, MD  traMADol (ULTRAM) 50 MG tablet Take 1 tablet (50 mg total) by mouth every 12 (twelve) hours as needed. 1 tabs by mouth Q8 hours, maximum 6 tabs per day. Patient not taking: Reported on 11/20/2020 10/04/20   Rosemarie Ax, MD  Triamcinolone Acetonide (ZILRETTA) 32 MG SRER Injection 51m into the right knee and 354minto the left knee Patient not taking: No sig reported 11/17/20   ScRosemarie AxMD  enoxaparin (LOVENOX) 60 MG/0.6ML injection Inject 0.6 mLs (60 mg total) into the skin 2  (two) times daily for 28 days. 04/06/20 07/22/20  WiGreer PickerelMD  gabapentin (NEURONTIN) 100 MG capsule Take 2 capsules (200 mg total) by mouth every 12 (twelve) hours. 04/06/20 04/13/20  WiGreer PickerelMD  pantoprazole (PROTONIX) 40 MG tablet Take 1 tablet (40 mg total) by mouth daily. Patient not taking: Reported on 07/15/2020 04/06/20 07/22/20  WiGreer PickerelMD                                                                                                                                    Past Surgical History Past Surgical History:  Procedure Laterality Date   BALLOON DILATION N/A 10/06/2015   Procedure: BALLOON DILATION;  Surgeon: KaMauri PoleMD;  [facility];  Service: Endoscopy;  Laterality: N/A;   BIOPSY  05/06/2018   Procedure: BIOPSY;  Surgeon: MaRush LandmarkaTelford Nab MD;  [facility] Service: Gastroenterology;;   COLONOSCOPY W/ POLYPECTOMY  11/01/2010   COLONOSCOPY WITH PROPOFOL N/A 10/05/2014   Procedure: COLONOSCOPY WITH PROPOFOL;  Surgeon: CaGatha MayerMD;  [facility];  Service: Endoscopy;  Laterality: N/A;   COLONOSCOPY WITH PROPOFOL N/A 05/06/2018   Procedure: COLONOSCOPY WITH PROPOFOL;  Surgeon: MaRush LandmarkaTelford Nab MD;  [facility] Service: Gastroenterology;  Laterality: N/A;   ESOPHAGEAL DILATION     ESOPHAGEAL MANOMETRY N/A 08/23/2015   Procedure: ESOPHAGEAL MANOMETRY (EM);  Surgeon: KaMauri PoleMD;  [facility];  Service: Endoscopy;  Laterality: N/A;   ESOPHAGOGASTRODUODENOSCOPY N/A 10/09/2014   Procedure: ESOPHAGOGASTRODUODENOSCOPY (EGD);  Surgeon: CaGatha MayerMD;  [facility];  Service: Endoscopy;  Laterality: N/A;   ESOPHAGOGASTRODUODENOSCOPY (EGD) WITH PROPOFOL N/A 08/23/2015   Procedure: ESOPHAGOGASTRODUODENOSCOPY (EGD) WITH PROPOFOL;  Surgeon: KaMauri PoleMD;  [facility];  Service: Endoscopy;  Laterality: N/A;   ESOPHAGOGASTRODUODENOSCOPY (EGD) WITH PROPOFOL N/A  10/06/2015   Procedure: ESOPHAGOGASTRODUODENOSCOPY (EGD) WITH PROPOFOL;  Surgeon: KaMauri PoleMD;  [facility];  Service: Endoscopy;  Laterality: N/A;  Rigiflex ballon size 3035m5mm size 45 minute proc,need Fluro Gastografin esophagram 2 hrs post EGD    GASTRIC ROUX-EN-Y N/A 04/05/2020   Procedure: LAPAROSCOPIC ROUX-EN-Y GASTRIC BYPASS WITH UPPER ENDOSCOPY;  Surgeon: WilGreer PickerelD;  [facility];  Service: General;  Laterality: N/A;   PACEMAKER INSERTION     POLYPECTOMY  05/06/2018   Procedure: POLYPECTOMY;  Surgeon: Irving Copas., MD;  Location: [place];  Service: Gastroenterology;;   Betsy Pries GENERATOR CHANGEOUT N/A 05/01/2018   Procedure: PPM GENERATOR Tonita Cong;  Surgeon: Deboraha Sprang, MD;  Location: [place] CV LAB;  Service: Cardiovascular;  Laterality: N/A;   Family History Family History  Problem Relation Age of Onset   Heart attack Father    Heart disease Father    Cancer Father        multiple myeloma   Hypertension Father    Prostate cancer Father        possible dx   Hypertension Mother    Dementia Mother    Colon cancer Neg Hx     Social History Social History   Tobacco Use   Smoking status: Former    Packs/day: 1.00    Years: 20.00    Pack years: 20.00    Types: Cigarettes    Quit date: 05/29/2004    Years since quitting: 16.4   Smokeless tobacco: Never  Vaping Use   Vaping Use: Never used  Substance Use Topics   Alcohol use: Yes    Comment: rarely   Drug use: No   Allergies Morphine and Metformin and related  Review of Systems Review of Systems  Constitutional:  Negative for fever.  Cardiovascular:  Negative for chest pain.  Gastrointestinal:  Negative for abdominal pain.  Musculoskeletal:  Positive for back pain.  Neurological:  Positive for weakness (generalized) and headaches. Negative for numbness.  All other systems are reviewed and are negative for acute change except as noted in the HPI  Physical  Exam Vital Signs  I have reviewed the triage vital signs BP (!) 97/56   Pulse 60   Temp 98.2 F (36.8 C) (Oral)   Resp 10   SpO2 94%   Physical Exam Vitals reviewed.  Constitutional:      General: He is not in acute distress.    Appearance: He is well-developed. He is obese. He is not diaphoretic.  HENT:     Head: Normocephalic and atraumatic.     Nose: Nose normal.  Eyes:     General: No scleral icterus.       Right eye: No discharge.        Left eye: No discharge.     Conjunctiva/sclera: Conjunctivae normal.     Pupils: Pupils are equal, round, and reactive to light.  Cardiovascular:     Rate and Rhythm: Normal rate and regular rhythm.     Heart sounds: No murmur heard.   No friction rub. No gallop.  Pulmonary:     Effort: Pulmonary effort is normal. No respiratory distress.     Breath sounds: Normal breath sounds. No stridor. No rales.  Abdominal:     General: There is no distension.     Palpations: Abdomen is soft.     Tenderness: There is no abdominal tenderness.  Musculoskeletal:     Cervical back: Normal range of motion and neck supple. Tenderness present.     Thoracic back: Tenderness present.     Lumbar back: Tenderness present.  Skin:    General: Skin is warm and dry.     Findings: No erythema or rash.  Neurological:     Mental Status: He is alert and oriented to person, place, and time.    ED Results and Treatments Labs (all labs ordered are listed, but only abnormal results are displayed) Labs Reviewed  CBC WITH DIFFERENTIAL/PLATELET - Abnormal; Notable for the following components:  Result Value   WBC 16.7 (*)    Neutro Abs 14.3 (*)    All other components within normal limits  COMPREHENSIVE METABOLIC PANEL - Abnormal; Notable for the following components:   Glucose, Bld 127 (*)    Total Protein 6.3 (*)    All other components within normal limits  CBG MONITORING, ED - Abnormal; Notable for the following components:   Glucose-Capillary 156  (*)    All other components within normal limits  I-STAT CHEM 8, ED - Abnormal; Notable for the following components:   BUN 7 (*)    Glucose, Bld 141 (*)    All other components within normal limits  RESP PANEL BY RT-PCR (FLU A&B, COVID) [id number]  TROPONIN I (HIGH SENSITIVITY)  TROPONIN I (HIGH SENSITIVITY)                                                                                                                         EKG  EKG Interpretation  Date/Time:  Friday November 19 2020 23:03:51 EDT Ventricular Rate:  60 PR Interval:  196 QRS Duration: 208 QT Interval:  524 QTC Calculation: 524 R Axis:   -65 Text Interpretation: AV dual-paced rhythm Abnormal ECG Confirmed by Addison Lank [phone]) on 11/19/2020 11:10:27 PM        Radiology DG Chest 2 View  Result Date: 11/19/2020 CLINICAL DATA:  Chest pain, back pain EXAM: CHEST - 2 VIEW COMPARISON:  10/05/2014 FINDINGS: Lungs are well expanded, symmetric, and clear. No pneumothorax or pleural effusion. Cardiac size within normal limits. Right subclavian dual lead pacemaker is unchanged pulmonary vascularity is normal. Osseous structures are age-appropriate. No acute bone abnormality. IMPRESSION: No active cardiopulmonary disease. Electronically Signed   By: Fidela Salisbury MD   On: 11/19/2020 23:35   CT Angio Chest/Abd/Pel for Dissection W and/or Wo Contrast  Result Date: 11/20/2020 CLINICAL DATA:  Chest and back pain. EXAM: CT ANGIOGRAPHY CHEST, ABDOMEN AND PELVIS TECHNIQUE: Non-contrast CT of the chest was initially obtained. Multidetector CT imaging through the chest, abdomen and pelvis was performed using the standard protocol during bolus administration of intravenous contrast. Multiplanar reconstructed images and MIPs were obtained and reviewed to evaluate the vascular anatomy. CONTRAST:  135m OMNIPAQUE IOHEXOL 350 MG/ML SOLN COMPARISON:  Radiograph yesterday.  Chest CT 09/01/2015 FINDINGS: CTA CHEST FINDINGS Cardiovascular: No aortic  dissection. No evidence of aortic hematoma noncontrast exam. Mild aortic atherosclerosis without acute aortic syndrome. No aneurysm. Mild cardiomegaly with coronary artery calcifications. Right-sided pacemaker with lead overlying the right ventricle. There is no central pulmonary embolus to the segmental level. Mediastinum/Nodes: No mediastinal or hilar adenopathy. Patulous esophagus wall thickening, similar findings seen on prior exam. Small hiatal hernia. No thyroid nodule. Lungs/Pleura: Subsegmental atelectasis in the right lower lobe. The lungs are otherwise clear. No pneumonia or focal airspace disease. No pleural fluid. No findings of pulmonary edema. Trachea and central bronchi are patent. Musculoskeletal: Diffuse thoracic spondylosis with endplate spurring. There are no acute or suspicious osseous  abnormalities. Review of the MIP images confirms the above findings. CTA ABDOMEN AND PELVIS FINDINGS VASCULAR Aorta: Atherosclerosis, mild-to-moderate in degree. No aneurysm, dissection, or acute aortic findings. No evidence of vasculitis. Celiac: Patent without evidence of aneurysm, dissection, vasculitis or significant stenosis. SMA: Patent without evidence of aneurysm, dissection, vasculitis or significant stenosis. Renals: There are 2 bilateral renal arteries, Co dominant bilaterally. No dissection, aneurysm, significant stenosis or acute findings. IMA: Patent without evidence of aneurysm, dissection, vasculitis or significant stenosis. Inflow: Patent without evidence of aneurysm, dissection, vasculitis or significant stenosis. Veins: No obvious venous abnormality within the limitations of this arterial phase study. Review of the MIP images confirms the above findings. NON-VASCULAR Hepatobiliary: Suspected hepatic steatosis. No focal abnormality on this arterial phase exam. Possible intraluminal gallstones. No pericholecystic inflammation. No biliary dilatation. Pancreas: No ductal dilatation or inflammation.  Spleen: Normal in size without focal abnormality. Adrenals/Urinary Tract: Normal adrenal glands. No hydronephrosis. Symmetric renal enhancement. Mild bilateral perinephric edema stably cleat chronic. No focal renal abnormality on this unenhanced exam. Partially distended urinary bladder. Stomach/Bowel: Small hiatal hernia. Gastric bypass anatomy. Decompressed excluded gastric remnant. Nondilated Roux limb. There is no small bowel obstruction or inflammation. Normal appendix. Moderate volume of stool throughout the colon with mild colonic redundancy. Mild colonic diverticulosis without diverticulitis. Lymphatic: No enlarged lymph nodes in the abdomen or pelvis. Reproductive: Prominent prostate gland spans 5 cm. Other: No ascites or free air. There is a small fat containing umbilical hernia. 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 Lumbar degenerative change. There are no acute or suspicious osseous abnormalities. Review of the MIP images confirms the above findings. IMPRESSION: 1. Aortic atherosclerosis without aneurysm or acute aortic abnormality. No dissection. 2. No acute abnormality in the chest, abdomen, or pelvis. 3. Chronic esophageal wall thickening. Small hiatal hernia. 4. Colonic diverticulosis without diverticulitis. 5. Bilateral L5 pars interarticularis defects without listhesis. Aortic Atherosclerosis (ICD10-I70.0). Electronically Signed   By: Keith Rake M.D.   On: 11/20/2020 01:30    Pertinent labs & imaging results that were available during my care of the patient were reviewed by me and considered in my medical decision making (see chart for details).  Medications Ordered in ED Medications  fentaNYL (SUBLIMAZE) injection 50 mcg (50 mcg Intravenous Given 11/20/20 0025)  iohexol (OMNIPAQUE) 350 MG/ML injection 100 mL (100 mLs Intravenous Contrast Given 11/20/20 0109)  alum & mag hydroxide-simeth (MAALOX/MYLANTA) 200-200-20 MG/5ML suspension 30 mL (30 mLs  Oral Given 11/20/20 0143)    And  lidocaine (XYLOCAINE) 2 % viscous mouth solution 15 mL (15 mLs Oral Given 11/20/20 0143)                                                                                                                                    Procedures .1-3 Lead EKG Interpretation  Date/Time: 11/19/2020 11:51 PM Performed by: Fatima Blank, MD Authorized by: Fatima Blank, MD  Interpretation: normal     ECG rate:  60   ECG rate assessment: normal     Rhythm: paced     Ectopy: none     Conduction: normal    (including critical care time)  Medical Decision Making / ED Course I have reviewed the nursing notes for this encounter and the patient's prior records (if available in EHR or on provided paperwork).   Aseem Sessums Feeback was evaluated in Emergency Department on 11/20/2020 for the symptoms described in the history of present illness. He was evaluated in the context of the global COVID-19 pandemic, which necessitated consideration that the patient might be at risk for infection with the SARS-CoV-2 virus that causes COVID-19. Institutional protocols and algorithms that pertain to the evaluation of patients at risk for COVID-19 are in a state of rapid change based on information released by regulatory bodies including the CDC and federal and state organizations. These policies and algorithms were followed during the patient's care in the ED.  Sudden onset upper back pain Improved with hot shower. Near syncopal episode in triage. CBG 156 EKG notable for paced rhythm. Work-up ruled out aortic dissection, PE. Serial troponins negative and downtrending x2. Interrogation of his device noted patient had an episode of atrial tachycardia around the time of triage. Patient with leukocytosis but no infectious symptoms.  Likely demargination from his near syncopal episode.  No anemia.  No significant electrolyte derangements or renal sufficiency.  Patient monitored  for several hours.  Symptoms resolved.  No recurrence of his near syncopal episode.  Orthostatics reassuring. Discussed with cardiology who reviewed his interrogation, stable he was not concerning.      Final Clinical Impression(s) / ED Diagnoses Final diagnoses:  Acute upper back pain  Near syncope    The patient appears reasonably screened and/or stabilized for discharge and I doubt any other medical condition or other [facility] requiring further screening, evaluation, or treatment in the ED at this time prior to discharge. Safe for discharge with strict return precautions.  Disposition: Discharge  Condition: Good  I have discussed the results, Dx and Tx plan with the patient/family who expressed understanding and agree(s) with the plan. Discharge instructions discussed at length. The patient/family was given strict return precautions who verbalized understanding of the instructions. No further questions at time of discharge.    ED Discharge Orders     None       Follow Up: Tonia Ghent, MD [place] [address] [phone]  Call  to schedule an appointment for close follow up  Minus Breeding, [address] [place] [address] [phone]  Call  to schedule an appointment for close follow up     This chart was dictated using voice recognition software.  Despite best efforts to proofread,  errors can occur which can change the documentation meaning.    Fatima Blank, MD 11/20/20 [phone]

## 2020-11-19 NOTE — ED Provider Notes (Signed)
MSE was initiated and I personally evaluated the patient and placed orders (if any) at  10:47 PM on November 19, 2020.   Patient with history of T2DM, pacemaker, achalasia, GERD, OSA, gastric bypass presents for evaluation of sudden onset of sharp, intense, bilateral upper back pain about one hour prior to arrival. The pain lasted a brief period and then decreased. He had symptoms that radiated to both arm. No chest pain. No persistent SOB/DOE. No nausea. Just traveled to/from [place], [place]. No history of similar symptoms.   Today's Vitals   11/19/20 2216 11/19/20 2231  BP: 118/74   Pulse: 68   Resp: 15   Temp: 98.2 F (36.8 C)   TempSrc: Oral   SpO2: 97%   PainSc:  6    There is no height or weight on file to calculate BMI.  Well appearing, obese patient Back nontender FROM UE's without strength deficit RRR No carotid bruit Lungs clear  Likely muscular back spasm in patient with significant risk factors. Labs, EKG, CXR ordered. Consider D-dimer on full evaluation?  The patient appears stable so that the remainder of the MSE may be completed by another provider.   Charlann Lange, PA-C 11/19/20 2251    Varney Biles, MD 11/20/20 [phone]

## 2020-11-19 NOTE — ED Triage Notes (Signed)
Pt c/o "severe, tight" feeling in head/shoulder, back pain approx 2hrs. States pain is less, but still present. States hot shower helped, but still feels "a heavy feeling," sensations like hyperglycemia/ pins & needles. Pt flew back from [place] last night, under quite a bit of stress recently.

## 2020-11-20 ENCOUNTER — Emergency Department (HOSPITAL_COMMUNITY): Payer: BC Managed Care – PPO

## 2020-11-20 DIAGNOSIS — M546 Pain in thoracic spine: Secondary | ICD-10-CM | POA: Diagnosis not present

## 2020-11-20 LAB — I-STAT CHEM 8, ED
BUN: 7 mg/dL — ABNORMAL LOW (ref 8–23)
Calcium, Ion: 1.2 mmol/L (ref 1.15–1.40)
Chloride: 103 mmol/L (ref 98–111)
Creatinine, Ser: 0.9 mg/dL (ref 0.61–1.24)
Glucose, Bld: 141 mg/dL — ABNORMAL HIGH (ref 70–99)
HCT: 44 % (ref 39.0–52.0)
Hemoglobin: 15 g/dL (ref 13.0–17.0)
Potassium: 3.9 mmol/L (ref 3.5–5.1)
Sodium: 139 mmol/L (ref 135–145)
TCO2: 26 mmol/L (ref 22–32)

## 2020-11-20 LAB — RESP PANEL BY RT-PCR (FLU A&B, COVID) ARPGX2
Influenza A by PCR: NEGATIVE
Influenza B by PCR: NEGATIVE
SARS Coronavirus 2 by RT PCR: NEGATIVE

## 2020-11-20 LAB — TROPONIN I (HIGH SENSITIVITY): Troponin I (High Sensitivity): 9 ng/L (ref <18)

## 2020-11-20 MED ORDER — IOHEXOL 350 MG/ML SOLN
100.0000 mL | Freq: Once | INTRAVENOUS | Status: AC | PRN
  Administered 2020-11-20: 100 mL via INTRAVENOUS

## 2020-11-20 MED ORDER — LIDOCAINE VISCOUS HCL 2 % MT SOLN
15.0000 mL | Freq: Once | OROMUCOSAL | Status: AC
  Administered 2020-11-20: 15 mL via ORAL
  Filled 2020-11-20: qty 15

## 2020-11-20 MED ORDER — ALUM & MAG HYDROXIDE-SIMETH 200-200-20 MG/5ML PO SUSP
30.0000 mL | Freq: Once | ORAL | Status: AC
  Administered 2020-11-20: 30 mL via ORAL
  Filled 2020-11-20: qty 30

## 2020-11-23 ENCOUNTER — Ambulatory Visit (INDEPENDENT_AMBULATORY_CARE_PROVIDER_SITE_OTHER): Payer: BC Managed Care – PPO | Admitting: Family Medicine

## 2020-11-23 ENCOUNTER — Other Ambulatory Visit: Payer: Self-pay

## 2020-11-23 ENCOUNTER — Ambulatory Visit: Payer: Self-pay

## 2020-11-23 ENCOUNTER — Encounter: Payer: Self-pay | Admitting: Family Medicine

## 2020-11-23 VITALS — BP 130/78 | Ht 71.0 in | Wt 312.0 lb

## 2020-11-23 DIAGNOSIS — M170 Bilateral primary osteoarthritis of knee: Secondary | ICD-10-CM

## 2020-11-23 NOTE — Progress Notes (Signed)
Glenn Lyons - 65 y.o. male MRN [id number]  Date of birth: January 06, 1956  SUBJECTIVE:  Including CC & ROS.  No chief complaint on file.   Glenn Lyons is a 65 y.o. male that is presenting for bilateral Zilretta injections.   Review of Systems See HPI   HISTORY: Past Medical, Surgical, Social, and Family History Reviewed & Updated per EMR.   Pertinent Historical Findings include:  Past Medical History:  Diagnosis Date   Achalasia    with prev eval at [facility].  No intervention as of 2012   Aspiration pneumonia ([place]) 10/05/2014   Backache, unspecified    Cardiac pacemaker St. Jude    ERI 2008   Depressive disorder, not elsewhere classified    Diabetes ([place])    Esophageal reflux    Morbid obesity ([place])    Obstructive sleep apnea    biapap settign 25-22   Presence of permanent cardiac pacemaker    St. Jude- Dr. Caryl Comes follows -device Check 07-06-14   Sinus bradycardia /pauses    Stricture and stenosis of esophagus     Past Surgical History:  Procedure Laterality Date   BALLOON DILATION N/A 10/06/2015   Procedure: BALLOON DILATION;  Surgeon: Mauri Pole, MD;  Location: [place] ENDOSCOPY;  Service: Endoscopy;  Laterality: N/A;   BIOPSY  05/06/2018   Procedure: BIOPSY;  Surgeon: Rush Landmark Telford Nab., MD;  Location: [place];  Service: Gastroenterology;;   COLONOSCOPY W/ POLYPECTOMY  11/01/2010   COLONOSCOPY WITH PROPOFOL N/A 10/05/2014   Procedure: COLONOSCOPY WITH PROPOFOL;  Surgeon: Gatha Mayer, MD;  [facility];  Service: Endoscopy;  Laterality: N/A;   COLONOSCOPY WITH PROPOFOL N/A 05/06/2018   Procedure: COLONOSCOPY WITH PROPOFOL;  Surgeon: Rush Landmark Telford Nab., MD;  Location: [place];  Service: Gastroenterology;  Laterality: N/A;   ESOPHAGEAL DILATION     ESOPHAGEAL MANOMETRY N/A 08/23/2015   Procedure: ESOPHAGEAL MANOMETRY (EM);  Surgeon: Mauri Pole, MD;  [facility];  Service: Endoscopy;  Laterality: N/A;   ESOPHAGOGASTRODUODENOSCOPY  N/A 10/09/2014   Procedure: ESOPHAGOGASTRODUODENOSCOPY (EGD);  Surgeon: Gatha Mayer, MD;  Location: [facility];  Service: Endoscopy;  Laterality: N/A;   ESOPHAGOGASTRODUODENOSCOPY (EGD) WITH PROPOFOL N/A 08/23/2015   Procedure: ESOPHAGOGASTRODUODENOSCOPY (EGD) WITH PROPOFOL;  Surgeon: Mauri Pole, MD;  [facility];  Service: Endoscopy;  Laterality: N/A;   ESOPHAGOGASTRODUODENOSCOPY (EGD) WITH PROPOFOL N/A 10/06/2015   Procedure: ESOPHAGOGASTRODUODENOSCOPY (EGD) WITH PROPOFOL;  Surgeon: Mauri Pole, MD;  Location: [place] [facility];  Service: Endoscopy;  Laterality: N/A;  Rigiflex ballon size 19m-35mm size 45 minute proc,need Fluro Gastografin esophagram 2 hrs post EGD    GASTRIC ROUX-EN-Y N/A 04/05/2020   Procedure: LAPAROSCOPIC ROUX-EN-Y GASTRIC BYPASS WITH UPPER ENDOSCOPY;  Surgeon: WGreer Pickerel MD;  [facility];  Service: General;  Laterality: N/A;   PACEMAKER INSERTION     POLYPECTOMY  05/06/2018   Procedure: POLYPECTOMY;  Surgeon: MIrving Copas, MD;  [place]  Service: Gastroenterology;;   PBetsy PriesGENERATOR CHANGEOUT N/A 05/01/2018   Procedure: PPM GENERATOR CHANGEOUT;  Surgeon: KDeboraha Sprang MD;  [facility];  Service: Cardiovascular;  Laterality: N/A;    Family History  Problem Relation Age of Onset   Heart attack Father    Heart disease Father    Cancer Father        multiple myeloma   Hypertension Father    Prostate cancer Father        possible dx   Hypertension Mother  Dementia Mother    Colon cancer Neg Hx     Social History   Socioeconomic History   Marital status: Married    Spouse name: Not on file   Number of children: 2   Years of education: Not on file   Highest education level: Not on file  Occupational History   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   Occupation: singer    Employer: [employer]  Tobacco Use   Smoking status: Former    Packs/day: 1.00    Years: 20.00    Pack years: 20.00     Types: Cigarettes    Quit date: 05/29/2004    Years since quitting: 16.5   Smokeless tobacco: Never  Vaping Use   Vaping Use: Never used  Substance and Sexual Activity   Alcohol use: Yes    Comment: rarely   Drug use: No   Sexual activity: Yes  Other Topics Concern   Not on file  Social History Narrative   From Air cabin crew   Married   Social Determinants of Health   Financial Resource Strain: Not on file  Food Insecurity: Not on file  Transportation Needs: Not on file  Physical Activity: Not on file  Stress: Not on file  Social Connections: Not on file  Intimate Partner Violence: Not on file     PHYSICAL EXAM:  VS: BP 130/78 (BP Location: Left Arm, Patient Position: Sitting, Cuff Size: Large)   Ht '5\' 11"'  (1.803 m)   Wt (!) 312 lb (141.5 kg)   BMI 43.52 kg/m  Physical Exam Gen: NAD, alert, cooperative with exam, well-appearing    Aspiration/Injection Procedure Note Glenn Lyons 18-May-1956  Procedure: Injection Indications: left knee pain   Procedure Details Consent: Risks of procedure as well as the alternatives and risks of each were explained to the (patient/caregiver).  Consent for procedure obtained. Time Out: Verified patient identification, verified procedure, site/side was marked, verified correct patient position, special equipment/implants available, medications/allergies/relevent history reviewed, required imaging and test results available.  Performed.  The area was cleaned with iodine and alcohol swabs.    The left knee superior lateral suprapatellar pouch was injected using 3 cc of 1% lidocaine on a 21-gauge 2 inch needle.  The syringe was switched and a mixture containing 5 cc's of 32 mg Zilretta and 4 cc's of 0.25% bupivacaine was injected.  Ultrasound was used. Images were obtained in long views showing the injection.    A sterile dressing was applied.  Patient did tolerate procedure well.    Aspiration/Injection Procedure  Note Glenn Lyons 09-Aug-1955  Procedure: Injection Indications: right knee pain   Procedure Details Consent: Risks of procedure as well as the alternatives and risks of each were explained to the (patient/caregiver).  Consent for procedure obtained. Time Out: Verified patient identification, verified procedure, site/side was marked, verified correct patient position, special equipment/implants available, medications/allergies/relevent history reviewed, required imaging and test results available.  Performed.  The area was cleaned with iodine and alcohol swabs.    The right knee superior lateral suprapatellar pouch was injected using 3 cc of 1% lidocaine on a 21-gauge 2 inch needle.  The syringe was switched and a mixture containing 5 cc's of 32 mg Zilretta and 4 cc's of 0.25% bupivacaine was injected.  Ultrasound was used. Images were obtained in long views showing the injection.    A sterile dressing was applied.  Patient did tolerate procedure well.    ASSESSMENT & PLAN:  OA (osteoarthritis) of knee Acute on chronic in nature. -Counseled on home exercise therapy and supportive care. -Bilateral Zilretta injections. -Counseled on having a holiday from the injections prior to considering arthroplasty.

## 2020-11-24 NOTE — Assessment & Plan Note (Signed)
Acute on chronic in nature. -Counseled on home exercise therapy and supportive care. -Bilateral Zilretta injections. -Counseled on having a holiday from the injections prior to considering arthroplasty.

## 2020-12-15 ENCOUNTER — Telehealth: Payer: Self-pay

## 2020-12-15 NOTE — Telephone Encounter (Signed)
Agree with ER eval, please try to get update on patient tomorrow.  Thanks.

## 2020-12-15 NOTE — Telephone Encounter (Addendum)
Donzetta Matters front office mgr brought note pt had scheduled my chart appt with Dr Damita Dunnings on 01/06/21 at 3:30 with Dr Damita Dunnings with double vision. I spoke with pt; pt has double vision for awhile; pt is presently at [facility] the rest of wk. Double vision for 15 mins. Pt did not feel like could walk; the inside of mouth at roof of mouth was numb.pt flew back on sat from [place] stay for 45 days. Happened in June around 23rd.and at that time had shoulder and arm pain and pt was fearful of having heart attack. Pt went to ED and was advised syncope. Eyes feel heavy and sensitive to light; last H/a was 2 wks while in [place] had covid test and was neg x 2. Eyes bothered him while in [place] as well. Pt could not focus while looking at computer screen; pt covered one eye and could see OK but when looked out of both eyes pt had double vision 10 - 15 mins. At that point the phone rang and pt did not know if he could talk intelligently with caller and pt did not answer the phone.the same symptoms happened first of year in [place] but was not as severe as it was today. Pt had gastric bypass and has lost wt and is not taking any meds and FBS have been 110 - 130. No CP or SOB this morning. Now pt said he feels sleepy but feels decent. Light still bothers his eyes. No way to ck BP. Pt was by self during the episode at work in [place] and not sure if could have spoken during episode but there was no one there to talk with. Pt could not get up and walk to get help. Pt did not try to walk. Pt felt odd during the episode but difficult to describe. Pt will be in [place] until Sat night and Sunday afternoon leaves for 1 wk to [place]; pt then goes to [place] for another trip and that was why scheduled appt for 01/06/21. Pt said not usually under stress until recently. Pt will go to ED at [facility] now for eval and any needed testing/ pt said he knows this morning was not his normal and will cb on 12/16/20 with  update. Sending note to Dr Damita Dunnings and Dr Darnell Level who is in office. Pt will go to ED today but plans to keep appt already set up with Dr Damita Dunnings on 01/06/21 for brief time will be back home.

## 2020-12-15 NOTE — Telephone Encounter (Signed)
With double vision and other neurological changes agree with ER eval.

## 2020-12-16 NOTE — Telephone Encounter (Signed)
Please see if you can get a copy of his records in the meantime and schedule f/u when possible.  Thanks.

## 2020-12-16 NOTE — Telephone Encounter (Signed)
Vm from pt stating he was seen at ER, per triage nurse advice.  Had CT scan as well as other tests.  There was nothing out of ordinary.  Says ER recommended pt get with PCP about a neuro referral.  Pt can be reached at [phone].

## 2020-12-16 NOTE — Telephone Encounter (Signed)
Called patient to see if we could move up appt from 01/06/21 and also where he was seen. Patient was seen at [facility] in [facility]; patient travels for work and will not be back in town until 01/04/21. He has two more states he has to go to before he will return home. I am sending over records request to the medical center patient was seen at now and will await records. I advised patient if anything is needed before his appt on 01/06/21 I will let him know.

## 2020-12-17 NOTE — Telephone Encounter (Signed)
Agreed.  I will await the records.  Thanks.

## 2021-01-03 ENCOUNTER — Other Ambulatory Visit: Payer: Self-pay

## 2021-01-03 ENCOUNTER — Encounter: Payer: Self-pay | Admitting: Family Medicine

## 2021-01-03 ENCOUNTER — Ambulatory Visit (INDEPENDENT_AMBULATORY_CARE_PROVIDER_SITE_OTHER): Payer: BC Managed Care – PPO | Admitting: Family Medicine

## 2021-01-03 VITALS — BP 118/70 | HR 65 | Temp 96.9°F | Ht 71.0 in | Wt 309.0 lb

## 2021-01-03 DIAGNOSIS — E1140 Type 2 diabetes mellitus with diabetic neuropathy, unspecified: Secondary | ICD-10-CM | POA: Diagnosis not present

## 2021-01-03 DIAGNOSIS — H532 Diplopia: Secondary | ICD-10-CM

## 2021-01-03 NOTE — Progress Notes (Signed)
This visit occurred during the SARS-CoV-2 public health emergency.  Safety protocols were in place, including screening questions prior to the visit, additional usage of staff PPE, and extensive cleaning of exam room while observing appropriate contact time as indicated for disinfecting solutions.  Possible double vision prev, he thought likely/possibly lateral deviation  Not currently.  He had been on the road a lot recently, out of town.  Prev episode: "I couldn't focus on the computer screen."  No vision loss.  He has a lot of screen time at baseline, using reading glasses at baseline.  After the episode passed, he was back to baseline.  No speech changes. He was eating a wafer but he noted in the inside of his mouth was numb.  Episode passed after ~5-10 minutes.  Had a HA.  Went to ER that evening.  CT done per patient report w/o acute changes.  No sx in the meantime, in the last few weeks.  No arm or leg weakness.  He did have some neck and back pain (not chest pain) at the end of June.  Seen at ER 11/19/20.  Sugar has been ~110 in the AMs.  Rarely >130.  Not lightheaded now.  No syncope.  No h/o SZ.  No tongue biting, incontinence.    Gradual weight loss noted.  D/w pt.  H/o DM2, no meds.  A1c pending.   He is still putting up with knee pain.    Meds, vitals, and allergies reviewed.   ROS: Per HPI unless specifically indicated in ROS section   GEN: nad, alert and oriented HEENT: ncat NECK: supple w/o LA CV: rrr.  PULM: ctab, no inc wob ABD: soft, +bs EXT: no edema SKIN: no acute rash CN 2-12 wnl B, S/S wnl x4  30 minutes were devoted to patient care in this encounter (this includes time spent reviewing the patient's file/history, interviewing and examining the patient, counseling/reviewing plan with patient).

## 2021-01-03 NOTE — Patient Instructions (Addendum)
Please sign a record release for the facility in [facility].   Take care.  Glad to see you. Go to the lab on the way out.   If you have mychart we'll likely use that to update you.

## 2021-01-04 ENCOUNTER — Ambulatory Visit: Payer: BC Managed Care – PPO | Admitting: Family Medicine

## 2021-01-04 LAB — COMPREHENSIVE METABOLIC PANEL
ALT: 22 U/L (ref 0–53)
AST: 21 U/L (ref 0–37)
Albumin: 3.9 g/dL (ref 3.5–5.2)
Alkaline Phosphatase: 95 U/L (ref 39–117)
BUN: 13 mg/dL (ref 6–23)
CO2: 29 mEq/L (ref 19–32)
Calcium: 9.8 mg/dL (ref 8.4–10.5)
Chloride: 103 mEq/L (ref 96–112)
Creatinine, Ser: 0.83 mg/dL (ref 0.40–1.50)
GFR: 93.15 mL/min (ref >60.00)
Glucose, Bld: 94 mg/dL (ref 70–99)
Potassium: 4.4 mEq/L (ref 3.5–5.1)
Sodium: 139 mEq/L (ref 135–145)
Total Bilirubin: 0.5 mg/dL (ref 0.2–1.2)
Total Protein: 6.2 g/dL (ref 6.0–8.3)

## 2021-01-04 LAB — HEMOGLOBIN A1C: Hgb A1c MFr Bld: 6.6 % — ABNORMAL HIGH (ref 4.6–6.5)

## 2021-01-04 LAB — CBC WITH DIFFERENTIAL/PLATELET
Basophils Absolute: 0.1 10*3/uL (ref 0.0–0.1)
Basophils Relative: 0.9 % (ref 0.0–3.0)
Eosinophils Absolute: 0.3 10*3/uL (ref 0.0–0.7)
Eosinophils Relative: 2.2 % (ref 0.0–5.0)
HCT: 44.4 % (ref 39.0–52.0)
Hemoglobin: 14.6 g/dL (ref 13.0–17.0)
Lymphocytes Relative: 10.9 % — ABNORMAL LOW (ref 12.0–46.0)
Lymphs Abs: 1.3 10*3/uL (ref 0.7–4.0)
MCHC: 32.9 g/dL (ref 30.0–36.0)
MCV: 91.2 fl (ref 78.0–100.0)
Monocytes Absolute: 0.9 10*3/uL (ref 0.1–1.0)
Monocytes Relative: 7 % (ref 3.0–12.0)
Neutro Abs: 9.7 10*3/uL — ABNORMAL HIGH (ref 1.4–7.7)
Neutrophils Relative %: 79 % — ABNORMAL HIGH (ref 43.0–77.0)
Platelets: 255 10*3/uL (ref 150.0–400.0)
RBC: 4.87 Mil/uL (ref 4.22–5.81)
RDW: 13.9 % (ref 11.5–15.5)
WBC: 12.3 10*3/uL — ABNORMAL HIGH (ref 4.0–10.5)

## 2021-01-04 LAB — TSH: TSH: 1.61 u[IU]/mL (ref 0.35–5.50)

## 2021-01-05 DIAGNOSIS — H532 Diplopia: Secondary | ICD-10-CM | POA: Insufficient documentation

## 2021-01-05 NOTE — Assessment & Plan Note (Signed)
No symptoms currently.  Normal neurologic exam now.  Symptoms self resolved.  Requesting outside records.  Refer to neurology.  He does not have weakness suggestive of myasthenia gravis.  Unclear source.  Differential diagnosis discussed with patient.

## 2021-01-05 NOTE — Assessment & Plan Note (Signed)
History of, with gradual weight loss noted.  Due for follow-up A1c.  See notes on labs.

## 2021-01-06 ENCOUNTER — Ambulatory Visit: Payer: 59

## 2021-01-06 ENCOUNTER — Ambulatory Visit: Payer: BC Managed Care – PPO | Admitting: Family Medicine

## 2021-01-07 ENCOUNTER — Encounter: Payer: Self-pay | Admitting: Neurology

## 2021-01-10 ENCOUNTER — Encounter: Payer: BC Managed Care – PPO | Attending: General Surgery | Admitting: Skilled Nursing Facility1

## 2021-01-11 ENCOUNTER — Ambulatory Visit: Payer: BC Managed Care – PPO | Admitting: Family Medicine

## 2021-02-02 ENCOUNTER — Telehealth: Payer: Self-pay | Admitting: *Deleted

## 2021-02-02 NOTE — Telephone Encounter (Signed)
   [facility] Pre-operative Risk Assessment    Patient Name: Glenn Lyons  DOB: 04-09-1956 MRN: [id number]  HEARTCARE STAFF:  - IMPORTANT!!!!!! Under Visit Info/Reason for Call, type in Other and utilize the format Clearance MM/DD/YY or Clearance TBD. Do not use dashes or single digits. - Please review there is not already an duplicate clearance open for this procedure. - If request is for dental extraction, please clarify the # of teeth to be extracted. - If the patient is currently at the dentist's office, call Pre-Op Callback Staff (MA/nurse) to input urgent request.  - If the patient is not currently in the dentist office, please route to the Pre-Op pool.  Request for surgical clearance:  What type of surgery is being performed? LEFT TOTAL KNEE ARTHROPLASTY  When is this surgery scheduled? 04/25/21  What type of clearance is required (medical clearance vs. Pharmacy clearance to hold med vs. Both)? MEDICAL  Are there any medications that need to be held prior to surgery and how long?  NONE LISTED   Practice name and name of physician performing surgery? [facility]; DR. Gaynelle Arabian  What is the office phone number? [phone]   7.   What is the office fax number? [phone]  8.   Anesthesia type (None, local, MAC, general) ? CHOICE   Julaine Hua 02/02/2021, 4:29 PM  _________________________________________________________________   (provider comments below)

## 2021-02-03 ENCOUNTER — Encounter: Payer: BC Managed Care – PPO | Attending: General Surgery | Admitting: Skilled Nursing Facility1

## 2021-02-03 NOTE — Telephone Encounter (Signed)
   Name: Glenn Lyons  DOB: 06-30-1955  MRN: [id number]  Primary Cardiologist: Minus Breeding, MD  Chart reviewed as part of pre-operative protocol coverage. Because of Glenn Lyons's past medical history and time since last visit, he will require a follow-up visit in order to better assess preoperative cardiovascular risk.  Pre-op covering staff: - Please schedule appointment and call patient to inform them. If patient already had an upcoming appointment within acceptable timeframe, please add "pre-op clearance" to the appointment notes so provider is aware. - Please contact requesting surgeon's office via preferred method (i.e, phone, fax) to inform them of need for appointment prior to surgery.  If applicable, this message will also be routed to pharmacy pool and/or primary cardiologist for input on holding anticoagulant/antiplatelet agent as requested below so that this information is available to the clearing provider at time of patient's appointment.   [place], PA  02/03/2021, 7:24 AM

## 2021-02-03 NOTE — Telephone Encounter (Signed)
1st attempt to reach pt to schedule appt for preop clearance. No answer. Lvm

## 2021-02-04 NOTE — Telephone Encounter (Signed)
Pt has been scheduled to see Richardson Dopp, PA-C, 03/11/2021, clearance will be addressed at that time.  Will route back to the requesting surgeon's office to make them aware.

## 2021-02-07 ENCOUNTER — Telehealth: Payer: Self-pay | Admitting: Family Medicine

## 2021-02-07 NOTE — Telephone Encounter (Signed)
Patient called to request Rt knee Zilretta injection-- Buy & Bill--glh

## 2021-02-07 NOTE — Telephone Encounter (Signed)
Zilretta # 1 ordered with [facility] pharmacy for physician buy & bill. Will contact pt to schedule once medication is arrived in pharmacy.

## 2021-02-10 NOTE — Telephone Encounter (Signed)
Glenn Lyons is in [place]. Patient is scheduled for 02/14/21.

## 2021-02-14 ENCOUNTER — Ambulatory Visit: Payer: Self-pay

## 2021-02-14 ENCOUNTER — Other Ambulatory Visit: Payer: Self-pay

## 2021-02-14 ENCOUNTER — Ambulatory Visit (INDEPENDENT_AMBULATORY_CARE_PROVIDER_SITE_OTHER): Payer: BC Managed Care – PPO | Admitting: Family Medicine

## 2021-02-14 ENCOUNTER — Encounter: Payer: Self-pay | Admitting: Family Medicine

## 2021-02-14 VITALS — Ht 71.0 in | Wt 304.0 lb

## 2021-02-14 DIAGNOSIS — M1711 Unilateral primary osteoarthritis, right knee: Secondary | ICD-10-CM

## 2021-02-14 DIAGNOSIS — M1712 Unilateral primary osteoarthritis, left knee: Secondary | ICD-10-CM | POA: Diagnosis not present

## 2021-02-14 MED ORDER — HYDROCODONE-ACETAMINOPHEN 5-325 MG PO TABS: 1.0000 | ORAL_TABLET | Freq: Three times a day (TID) | ORAL | 0 refills | Status: DC | PRN

## 2021-02-14 NOTE — Progress Notes (Addendum)
Glenn Lyons - 65 y.o. male MRN [id number]  Date of birth: [birth year]  SUBJECTIVE:  Including CC & ROS.  No chief complaint on file.   Glenn Lyons is a 65 y.o. male that is presenting with acute on chronic right knee pain.  The pain is severe in nature.  He is having his left knee replaced in a few months.   Review of Systems See HPI   HISTORY: Past Medical, Surgical, Social, and Family History Reviewed & Updated per EMR.   Pertinent Historical Findings include:  Past Medical History:  Diagnosis Date   Achalasia    with prev eval at [facility].  No intervention as of 2012   Aspiration pneumonia ([place]) 10/05/2014   Backache, unspecified    Cardiac pacemaker St. Jude    ERI 2008   Depressive disorder, not elsewhere classified    Diabetes ([place])    Esophageal reflux    Morbid obesity ([place])    Obstructive sleep apnea    biapap settign 25-22   Presence of permanent cardiac pacemaker    St. Jude- Dr. Caryl Comes follows -device Check 07-06-14   Sinus bradycardia /pauses    Stricture and stenosis of esophagus     Past Surgical History:  Procedure Laterality Date   BALLOON DILATION N/A 10/06/2015   Procedure: BALLOON DILATION;  Surgeon: Mauri Pole, MD;  Location: [place] ENDOSCOPY;  Service: Endoscopy;  Laterality: N/A;   BIOPSY  05/06/2018   Procedure: BIOPSY;  Surgeon: Rush Landmark Telford Nab., MD;  Location: [place];  Service: Gastroenterology;;   COLONOSCOPY W/ POLYPECTOMY  11/01/2010   COLONOSCOPY WITH PROPOFOL N/A 10/05/2014   Procedure: COLONOSCOPY WITH PROPOFOL;  Surgeon: Gatha Mayer, MD;  [facility];  Service: Endoscopy;  Laterality: N/A;   COLONOSCOPY WITH PROPOFOL N/A 05/06/2018   Procedure: COLONOSCOPY WITH PROPOFOL;  Surgeon: Rush Landmark Telford Nab., MD;  Location: [place];  Service: Gastroenterology;  Laterality: N/A;   ESOPHAGEAL DILATION     ESOPHAGEAL MANOMETRY N/A 08/23/2015   Procedure: ESOPHAGEAL MANOMETRY (EM);  Surgeon: Mauri Pole, MD;   [facility];  Service: Endoscopy;  Laterality: N/A;   ESOPHAGOGASTRODUODENOSCOPY N/A 10/09/2014   Procedure: ESOPHAGOGASTRODUODENOSCOPY (EGD);  Surgeon: Gatha Mayer, MD;  Location: [facility];  Service: Endoscopy;  Laterality: N/A;   ESOPHAGOGASTRODUODENOSCOPY (EGD) WITH PROPOFOL N/A 08/23/2015   Procedure: ESOPHAGOGASTRODUODENOSCOPY (EGD) WITH PROPOFOL;  Surgeon: Mauri Pole, MD;  [facility];  Service: Endoscopy;  Laterality: N/A;   ESOPHAGOGASTRODUODENOSCOPY (EGD) WITH PROPOFOL N/A 10/06/2015   Procedure: ESOPHAGOGASTRODUODENOSCOPY (EGD) WITH PROPOFOL;  Surgeon: Mauri Pole, MD;  Location: [place] [facility];  Service: Endoscopy;  Laterality: N/A;  Rigiflex ballon size 70m-35mm size 45 minute proc,need Fluro Gastografin esophagram 2 hrs post EGD    GASTRIC ROUX-EN-Y N/A 04/05/2020   Procedure: LAPAROSCOPIC ROUX-EN-Y GASTRIC BYPASS WITH UPPER ENDOSCOPY;  Surgeon: WGreer Pickerel MD;  [facility];  Service: General;  Laterality: N/A;   PACEMAKER INSERTION     POLYPECTOMY  05/06/2018   Procedure: POLYPECTOMY;  Surgeon: MIrving Copas, MD;  [facility]  Service: Gastroenterology;;   PBetsy PriesGENERATOR CHANGEOUT N/A 05/01/2018   Procedure: PPM GENERATOR CHANGEOUT;  Surgeon: KDeboraha Sprang MD;  [facility];  Service: Cardiovascular;  Laterality: N/A;    Family History  Problem Relation Age of Onset   Heart attack Father    Heart disease Father    Cancer Father        multiple myeloma   Hypertension  Father    Prostate cancer Father        possible dx   Hypertension Mother    Dementia Mother    Colon cancer Neg Hx     Social History   Socioeconomic History   Marital status: Married    Spouse name: Not on file   Number of children: 2   Years of education: Not on file   Highest education level: Not on file  Occupational History   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   Occupation: singer    Employer: [employer]   Tobacco Use   Smoking status: Former    Packs/day: 1.00    Years: 20.00    Pack years: 20.00    Types: Cigarettes    Quit date: 05/29/2004    Years since quitting: 16.7   Smokeless tobacco: Never  Vaping Use   Vaping Use: Never used  Substance and Sexual Activity   Alcohol use: Yes    Comment: rarely   Drug use: No   Sexual activity: Yes  Other Topics Concern   Not on file  Social History Narrative   From Air cabin crew   Married   Social Determinants of Health   Financial Resource Strain: Not on file  Food Insecurity: Not on file  Transportation Needs: Not on file  Physical Activity: Not on file  Stress: Not on file  Social Connections: Not on file  Intimate Partner Violence: Not on file     PHYSICAL EXAM:  VS: Ht _0  (1.803 m)   Wt (!) 304 lb (137.9 kg)   BMI 42.40 kg/m  Physical Exam Gen: NAD, alert, cooperative with exam, well-appearing    Aspiration/Injection Procedure Note Glenn Lyons February 06, 1956  Procedure: Aspiration and Injection Indications: right knee pain   Procedure Details Consent: Risks of procedure as well as the alternatives and risks of each were explained to the (patient/caregiver).  Consent for procedure obtained. Time Out: Verified patient identification, verified procedure, site/side was marked, verified correct patient position, special equipment/implants available, medications/allergies/relevent history reviewed, required imaging and test results available.  Performed.  The area was cleaned with iodine and alcohol swabs.    The right knee superior lateral suprapatellar pouch was injected using 3 cc of 1% lidocaine on a 25-gauge 1-1/2 inch needle.  An 18-gauge 1-1/2 needle was used to achieve aspiration.  The syringe was switched and a mixture containing 5 cc's of 32 mg Zilretta and 4 cc's of 0.25% bupivacaine was injected.  Ultrasound was used. Images were obtained in long views showing the injection.   Amount of Fluid  Aspirated:  76m Character of Fluid: straw colored and red colored Fluid was sent for: n/a  A sterile dressing was applied.  Patient did tolerate procedure well.        ASSESSMENT & PLAN:   OA (osteoarthritis) of knee Acute on chronic in nature.  He is awaiting arthroplasty in the left knee and the right knee has gotten significantly more painful. -Counseled on home exercise therapy and supportive care. -Injection today. -Norco.   Degenerative arthritis of left knee Acute on chronic in nature.  Has tried previous gel injections and steroid injections with limited improvement.  Pain is acutely occurring and has arthroplasty scheduled at the end of November. -Counseled on home exercise therapy and supportive care. -Pursue gel injection.

## 2021-02-14 NOTE — Patient Instructions (Signed)
Good to see you Please try ice   Please send me a message in MyChart with any questions or updates.  Please see me back in 4 weeks or as needed if better.   --Dr. Raeford Razor

## 2021-02-15 NOTE — Assessment & Plan Note (Signed)
Acute on chronic in nature.  He is awaiting arthroplasty in the left knee and the right knee has gotten significantly more painful. -Counseled on home exercise therapy and supportive care. -Injection today. -Norco.

## 2021-02-17 NOTE — Assessment & Plan Note (Signed)
Acute on chronic in nature.  Has tried previous gel injections and steroid injections with limited improvement.  Pain is acutely occurring and has arthroplasty scheduled at the end of November. -Counseled on home exercise therapy and supportive care. -Pursue gel injection.

## 2021-02-21 ENCOUNTER — Telehealth: Payer: Self-pay | Admitting: *Deleted

## 2021-02-21 ENCOUNTER — Ambulatory Visit: Payer: BC Managed Care – PPO | Admitting: Neurology

## 2021-02-21 ENCOUNTER — Ambulatory Visit: Payer: BC Managed Care – PPO | Admitting: Family Medicine

## 2021-02-21 ENCOUNTER — Ambulatory Visit: Payer: Self-pay

## 2021-02-21 ENCOUNTER — Encounter: Payer: Self-pay | Admitting: Family Medicine

## 2021-02-21 VITALS — Ht 71.0 in | Wt 304.0 lb

## 2021-02-21 DIAGNOSIS — M1712 Unilateral primary osteoarthritis, left knee: Secondary | ICD-10-CM | POA: Diagnosis not present

## 2021-02-21 NOTE — Telephone Encounter (Signed)
Per [employer] [facility] pt's Durolane is covered at 100%. Patient has $94 copay. Precert is approved per [employer] effective 02/18/21-08/16/2021. Approved Case ID: [id number].  [employer] # [phone] Call ref #: [id number]  Patient informed of above at his 02/21/21 OV.

## 2021-02-21 NOTE — Progress Notes (Signed)
Glenn Lyons - 65 y.o. male MRN [id number]  Date of birth: 1956/03/08  SUBJECTIVE:  Including CC & ROS.  No chief complaint on file.   Glenn Lyons is a 65 y.o. male that is here for a gel injection.   Review of Systems See HPI   HISTORY: Past Medical, Surgical, Social, and Family History Reviewed & Updated per EMR.   Pertinent Historical Findings include:  Past Medical History:  Diagnosis Date   Achalasia    with prev eval at [facility].  No intervention as of 2012   Aspiration pneumonia ([place]) 10/05/2014   Backache, unspecified    Cardiac pacemaker St. Jude    ERI 2008   Depressive disorder, not elsewhere classified    Diabetes ([place])    Esophageal reflux    Morbid obesity ([place])    Obstructive sleep apnea    biapap settign 25-22   Presence of permanent cardiac pacemaker    St. Jude- Dr. Caryl Comes follows -device Check 07-06-14   Sinus bradycardia /pauses    Stricture and stenosis of esophagus     Past Surgical History:  Procedure Laterality Date   BALLOON DILATION N/A 10/06/2015   Procedure: BALLOON DILATION;  Surgeon: Mauri Pole, MD;  Location: [place] [facility];  Service: Endoscopy;  Laterality: N/A;   BIOPSY  05/06/2018   Procedure: BIOPSY;  Surgeon: Rush Landmark Telford Nab., MD;  Location: [place];  Service: Gastroenterology;;   COLONOSCOPY W/ POLYPECTOMY  11/01/2010   COLONOSCOPY WITH PROPOFOL N/A 10/05/2014   Procedure: COLONOSCOPY WITH PROPOFOL;  Surgeon: Gatha Mayer, MD;  Location: [facility];  Service: Endoscopy;  Laterality: N/A;   COLONOSCOPY WITH PROPOFOL N/A 05/06/2018   Procedure: COLONOSCOPY WITH PROPOFOL;  Surgeon: Rush Landmark Telford Nab., MD;  Location: [place];  Service: Gastroenterology;  Laterality: N/A;   ESOPHAGEAL DILATION     ESOPHAGEAL MANOMETRY N/A 08/23/2015   Procedure: ESOPHAGEAL MANOMETRY (EM);  Surgeon: Mauri Pole, MD;  [facility];  Service: Endoscopy;  Laterality: N/A;   ESOPHAGOGASTRODUODENOSCOPY N/A 10/09/2014    Procedure: ESOPHAGOGASTRODUODENOSCOPY (EGD);  Surgeon: Gatha Mayer, MD;  Location: [facility];  Service: Endoscopy;  Laterality: N/A;   ESOPHAGOGASTRODUODENOSCOPY (EGD) WITH PROPOFOL N/A 08/23/2015   Procedure: ESOPHAGOGASTRODUODENOSCOPY (EGD) WITH PROPOFOL;  Surgeon: Mauri Pole, MD;  [facility];  Service: Endoscopy;  Laterality: N/A;   ESOPHAGOGASTRODUODENOSCOPY (EGD) WITH PROPOFOL N/A 10/06/2015   Procedure: ESOPHAGOGASTRODUODENOSCOPY (EGD) WITH PROPOFOL;  Surgeon: Mauri Pole, MD;  Location: [place] [facility];  Service: Endoscopy;  Laterality: N/A;  Rigiflex ballon size 85m-35mm size 45 minute proc,need Fluro Gastografin esophagram 2 hrs post EGD    GASTRIC ROUX-EN-Y N/A 04/05/2020   Procedure: LAPAROSCOPIC ROUX-EN-Y GASTRIC BYPASS WITH UPPER ENDOSCOPY;  Surgeon: WGreer Pickerel MD;  [facility];  Service: General;  Laterality: N/A;   PACEMAKER INSERTION     POLYPECTOMY  05/06/2018   Procedure: POLYPECTOMY;  Surgeon: MIrving Copas, MD;  [place]  Service: Gastroenterology;;   PBetsy PriesGENERATOR CHANGEOUT N/A 05/01/2018   Procedure: PPM GENERATOR CHANGEOUT;  Surgeon: KDeboraha Sprang MD;  [facility];  Service: Cardiovascular;  Laterality: N/A;    Family History  Problem Relation Age of Onset   Heart attack Father    Heart disease Father    Cancer Father        multiple myeloma   Hypertension Father    Prostate cancer Father        possible dx   Hypertension Mother  Dementia Mother    Colon cancer Neg Hx     Social History   Socioeconomic History   Marital status: Married    Spouse name: Not on file   Number of children: 2   Years of education: Not on file   Highest education level: Not on file  Occupational History   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   Occupation: singer    Employer: [employer]  Tobacco Use   Smoking status: Former    Packs/day: 1.00    Years: 20.00    Pack years: 20.00    Types:  Cigarettes    Quit date: 05/29/2004    Years since quitting: 16.7   Smokeless tobacco: Never  Vaping Use   Vaping Use: Never used  Substance and Sexual Activity   Alcohol use: Yes    Comment: rarely   Drug use: No   Sexual activity: Yes  Other Topics Concern   Not on file  Social History Narrative   From Air cabin crew   Married   Social Determinants of Health   Financial Resource Strain: Not on file  Food Insecurity: Not on file  Transportation Needs: Not on file  Physical Activity: Not on file  Stress: Not on file  Social Connections: Not on file  Intimate Partner Violence: Not on file     PHYSICAL EXAM:  VS: Ht 5' 11" (1.803 m)   Wt (!) 304 lb (137.9 kg)   BMI 42.40 kg/m  Physical Exam Gen: NAD, alert, cooperative with exam, well-appearing    Aspiration/Injection Procedure Note Glenn Lyons 10-20-1955  Procedure: Aspiration and Injection Indications: left knee pain   Procedure Details Consent: Risks of procedure as well as the alternatives and risks of each were explained to the (patient/caregiver).  Consent for procedure obtained. Time Out: Verified patient identification, verified procedure, site/side was marked, verified correct patient position, special equipment/implants available, medications/allergies/relevent history reviewed, required imaging and test results available.  Performed.  The area was cleaned with iodine and alcohol swabs.    The left knee superior lateral suprapatellar pouch was injected using 4 cc's of 1% lidocaine with a 25 1 1/2" needle.  An 18-gauge 1-1/2 needle was used to achieve aspiration.  The syringe was switched and a 60 mg/61m was injected. Ultrasound was used. Images were obtained in  Long views showing the injection.    Amount of Fluid Aspirated:  357mCharacter of Fluid: clear and straw colored Fluid was sent for: n/a A sterile dressing was applied.  Patient did tolerate procedure well.     ASSESSMENT & PLAN:    Degenerative arthritis of left knee Completed gel injection today. -Can pursue genicular nerve block and ablation in the few weeks.

## 2021-02-21 NOTE — Assessment & Plan Note (Signed)
Completed gel injection today. -Can pursue genicular nerve block and ablation in the few weeks.

## 2021-02-28 ENCOUNTER — Ambulatory Visit: Payer: Self-pay

## 2021-02-28 ENCOUNTER — Ambulatory Visit: Payer: 59 | Admitting: Family Medicine

## 2021-02-28 VITALS — Ht 71.5 in | Wt 302.0 lb

## 2021-02-28 DIAGNOSIS — M1712 Unilateral primary osteoarthritis, left knee: Secondary | ICD-10-CM | POA: Diagnosis not present

## 2021-02-28 DIAGNOSIS — M170 Bilateral primary osteoarthritis of knee: Secondary | ICD-10-CM | POA: Diagnosis not present

## 2021-02-28 NOTE — Assessment & Plan Note (Addendum)
Acute worsening of his bilateral knee pain.  The right knee we have done Zilretta recently.  He has a left knee arthroplasty scheduled in November.  We are trying to get him through to that point. -Counseled on home exercise therapy and supportive care. -Bilateral genicular nerve block today.  HIs pain decreased greater than 50% in each knee after the nerve block.  We will refer for nerve ablation.

## 2021-02-28 NOTE — Progress Notes (Signed)
Glenn Lyons - 65 y.o. male MRN [id number]  Date of birth: 1955/09/28  SUBJECTIVE:  Including CC & ROS.  No chief complaint on file.   Glenn Lyons is a 65 y.o. male that is presenting with acute worsening of his bilateral knee pain.  He does have his arthroplasty of his left knee scheduled at the end of November.  We have done Zilretta in the right knee.   Review of Systems See HPI   HISTORY: Past Medical, Surgical, Social, and Family History Reviewed & Updated per EMR.   Pertinent Historical Findings include:  Past Medical History:  Diagnosis Date   Achalasia    with prev eval at [facility].  No intervention as of 2012   Aspiration pneumonia ([place]) 10/05/2014   Backache, unspecified    Cardiac pacemaker St. Jude    ERI 2008   Depressive disorder, not elsewhere classified    Diabetes ([place])    Esophageal reflux    Morbid obesity ([place])    Obstructive sleep apnea    biapap settign 25-22   Presence of permanent cardiac pacemaker    St. Jude- Dr. Caryl Comes follows -device Check 07-06-14   Sinus bradycardia /pauses    Stricture and stenosis of esophagus     Past Surgical History:  Procedure Laterality Date   BALLOON DILATION N/A 10/06/2015   Procedure: BALLOON DILATION;  Surgeon: Mauri Pole, MD;  Location: [place] [facility];  Service: Endoscopy;  Laterality: N/A;   BIOPSY  05/06/2018   Procedure: BIOPSY;  Surgeon: Rush Landmark Telford Nab., MD;  Location: [place];  Service: Gastroenterology;;   COLONOSCOPY W/ POLYPECTOMY  11/01/2010   COLONOSCOPY WITH PROPOFOL N/A 10/05/2014   Procedure: COLONOSCOPY WITH PROPOFOL;  Surgeon: Gatha Mayer, MD;  [facility];  Service: Endoscopy;  Laterality: N/A;   COLONOSCOPY WITH PROPOFOL N/A 05/06/2018   Procedure: COLONOSCOPY WITH PROPOFOL;  Surgeon: Rush Landmark Telford Nab., MD;  Location: [place];  Service: Gastroenterology;  Laterality: N/A;   ESOPHAGEAL DILATION     ESOPHAGEAL MANOMETRY N/A 08/23/2015   Procedure: ESOPHAGEAL  MANOMETRY (EM);  Surgeon: Mauri Pole, MD;  [facility];  Service: Endoscopy;  Laterality: N/A;   ESOPHAGOGASTRODUODENOSCOPY N/A 10/09/2014   Procedure: ESOPHAGOGASTRODUODENOSCOPY (EGD);  Surgeon: Gatha Mayer, MD;  [facility];  Service: Endoscopy;  Laterality: N/A;   ESOPHAGOGASTRODUODENOSCOPY (EGD) WITH PROPOFOL N/A 08/23/2015   Procedure: ESOPHAGOGASTRODUODENOSCOPY (EGD) WITH PROPOFOL;  Surgeon: Mauri Pole, MD;  [facility];  Service: Endoscopy;  Laterality: N/A;   ESOPHAGOGASTRODUODENOSCOPY (EGD) WITH PROPOFOL N/A 10/06/2015   Procedure: ESOPHAGOGASTRODUODENOSCOPY (EGD) WITH PROPOFOL;  Surgeon: Mauri Pole, MD;  Location: [place] [facility];  Service: Endoscopy;  Laterality: N/A;  Rigiflex ballon size 2m-35mm size 45 minute proc,need Fluro Gastografin esophagram 2 hrs post EGD    GASTRIC ROUX-EN-Y N/A 04/05/2020   Procedure: LAPAROSCOPIC ROUX-EN-Y GASTRIC BYPASS WITH UPPER ENDOSCOPY;  Surgeon: WGreer Pickerel MD;  [facility];  Service: General;  Laterality: N/A;   PACEMAKER INSERTION     POLYPECTOMY  05/06/2018   Procedure: POLYPECTOMY;  Surgeon: MIrving Copas, MD;  [place]  Service: Gastroenterology;;   PBetsy PriesGENERATOR CHANGEOUT N/A 05/01/2018   Procedure: PPM GENERATOR CHANGEOUT;  Surgeon: KDeboraha Sprang MD;  [facility];  Service: Cardiovascular;  Laterality: N/A;    Family History  Problem Relation Age of Onset   Heart attack Father    Heart disease Father    Cancer Father  multiple myeloma   Hypertension Father    Prostate cancer Father        possible dx   Hypertension Mother    Dementia Mother    Colon cancer Neg Hx     Social History   Socioeconomic History   Marital status: Married    Spouse name: Not on file   Number of children: 2   Years of education: Not on file   Highest education level: Not on file  Occupational History   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    Occupation: singer    Employer: [employer]  Tobacco Use   Smoking status: Former    Packs/day: 1.00    Years: 20.00    Pack years: 20.00    Types: Cigarettes    Quit date: 05/29/2004    Years since quitting: 16.7   Smokeless tobacco: Never  Vaping Use   Vaping Use: Never used  Substance and Sexual Activity   Alcohol use: Yes    Comment: rarely   Drug use: No   Sexual activity: Yes  Other Topics Concern   Not on file  Social History Narrative   From Air cabin crew   Married   Social Determinants of Health   Financial Resource Strain: Not on file  Food Insecurity: Not on file  Transportation Needs: Not on file  Physical Activity: Not on file  Stress: Not on file  Social Connections: Not on file  Intimate Partner Violence: Not on file     PHYSICAL EXAM:  VS: Ht 5' 11.5" (1.816 m)   Wt (!) 302 lb (137 kg)   BMI 41.53 kg/m  Physical Exam Gen: NAD, alert, cooperative with exam, well-appearing   Genicular Nerve Block Procedure Note Glenn Lyons 02-02-1956  Procedure: Injection Indications: right knee pain   Procedure Details Consent: Risks of procedure as well as the alternatives and risks of each were explained to the (patient/caregiver).  Consent for procedure obtained. Time Out: Verified patient identification, verified procedure, site/side was marked, verified correct patient position, special equipment/implants available, medications/allergies/relevent history reviewed, required imaging and test results available.  Performed.  The area was cleaned with iodine and alcohol swabs.    The right knee superomedial, inferomedial, and superolateral genicular nerves was injected using 2 cc's of 1% lidocaine and 3 cc's of 0.25% bupivacaine with a 25 1 1/2" needle.  Ultrasound was used. Images were obtained in short views showing the injection.     A sterile dressing was applied.  Patient did tolerate procedure well.  Genicular Nerve Block Procedure  Note Glenn Lyons 1956/01/31  Procedure: Injection Indications: left knee pain   Procedure Details Consent: Risks of procedure as well as the alternatives and risks of each were explained to the (patient/caregiver).  Consent for procedure obtained. Time Out: Verified patient identification, verified procedure, site/side was marked, verified correct patient position, special equipment/implants available, medications/allergies/relevent history reviewed, required imaging and test results available.  Performed.  The area was cleaned with iodine and alcohol swabs.    The left knee superomedial, inferomedial, and superolateral genicular nerves was injected using 2 cc's of 1% lidocaine and 3 cc's of 0.25% bupivacaine with a 25 1 1/2" needle.  Ultrasound was used. Images were obtained in short views showing the injection.     A sterile dressing was applied.  Patient did tolerate procedure well.      ASSESSMENT & PLAN:   OA (osteoarthritis) of knee Acute worsening of his bilateral knee pain.  The right knee we have done Zilretta recently.  He has a left knee arthroplasty scheduled in November.  We are trying to get him through to that point. -Counseled on home exercise therapy and supportive care. -Bilateral genicular nerve block today.  HIs pain decreased greater than 50% in each knee after the nerve block.  We will refer for nerve ablation.

## 2021-02-28 NOTE — Patient Instructions (Signed)
Good to see you Please use ice as needed  Please send me a message in MyChart with any questions or updates.  Please see me back as needed.   --Dr. Jarryd Gratz  

## 2021-03-11 ENCOUNTER — Ambulatory Visit: Payer: BC Managed Care – PPO | Admitting: Physician Assistant

## 2021-04-05 NOTE — Progress Notes (Signed)
Cardiology Office Note    Date:  04/13/2021   ID:  Glenn Lyons, DOB 1956-01-27, MRN [id number]   PCP:  Tonia Ghent, MD   [place]  Cardiologist:  Virl Axe, MD   Advanced Practice Provider:  No care team member to display Electrophysiologist:  None   [phone]   Chief Complaint  Patient presents with   Pre-op Exam    History of Present Illness:  Glenn Lyons is a 65 y.o. male with history of hypertension, DM 2, OSA on CPAP, sinus node dysfunction status post permanent pacemaker.  Patient saw Dr. Percival Spanish 10/16/2019 for preoperative clearance before undergoing bariatric surgery.  He had poor functional capacity and was not able to walk on level ground without shortness of breath.  Lexiscan Myoview 11/13/2019 low risk no ischemia and he was cleared for surgery.  Patient on my schedule today for cardiac clearance before undergoing knee arthroplasty 04/25/2021 by Dr. Wynelle Link. Patient has lost about 100 lbs since bariatric surgery. Not as mobile with knee pain. Denies chest pain, dyspnea, Palpitations, dizziness or presyncope.     Past Medical History:  Diagnosis Date   Achalasia    with prev eval at [facility]  No intervention as of 2012   Aspiration pneumonia ([place]) 10/05/2014   Backache, unspecified    Cardiac pacemaker St. Jude    ERI 2008   Depressive disorder, not elsewhere classified    Diabetes ([place])    Esophageal reflux    Morbid obesity ([place])    Obstructive sleep apnea    biapap settign 25-22   Presence of permanent cardiac pacemaker    St. Jude- Dr. Caryl Comes follows -device Check 07-06-14   Sinus bradycardia /pauses    Stricture and stenosis of esophagus     Past Surgical History:  Procedure Laterality Date   BALLOON DILATION N/A 10/06/2015   Procedure: BALLOON DILATION;  Surgeon: Mauri Pole, MD;  Location: [place] [facility];  Service: Endoscopy;  Laterality: N/A;   BIOPSY  05/06/2018   Procedure: BIOPSY;  Surgeon: Rush Landmark  Telford Nab., MD;  Location: [place];  Service: Gastroenterology;;   COLONOSCOPY W/ POLYPECTOMY  11/01/2010   COLONOSCOPY WITH PROPOFOL N/A 10/05/2014   Procedure: COLONOSCOPY WITH PROPOFOL;  Surgeon: Gatha Mayer, MD;  Location: [facility];  Service: Endoscopy;  Laterality: N/A;   COLONOSCOPY WITH PROPOFOL N/A 05/06/2018   Procedure: COLONOSCOPY WITH PROPOFOL;  Surgeon: Rush Landmark Telford Nab., MD;  Location: [place];  Service: Gastroenterology;  Laterality: N/A;   ESOPHAGEAL DILATION     ESOPHAGEAL MANOMETRY N/A 08/23/2015   Procedure: ESOPHAGEAL MANOMETRY (EM);  Surgeon: Mauri Pole, MD;  Location: [facility];  Service: Endoscopy;  Laterality: N/A;   ESOPHAGOGASTRODUODENOSCOPY N/A 10/09/2014   Procedure: ESOPHAGOGASTRODUODENOSCOPY (EGD);  Surgeon: Gatha Mayer, MD;  Location: [facility];  Service: Endoscopy;  Laterality: N/A;   ESOPHAGOGASTRODUODENOSCOPY (EGD) WITH PROPOFOL N/A 08/23/2015   Procedure: ESOPHAGOGASTRODUODENOSCOPY (EGD) WITH PROPOFOL;  Surgeon: Mauri Pole, MD;  [facility];  Service: Endoscopy;  Laterality: N/A;   ESOPHAGOGASTRODUODENOSCOPY (EGD) WITH PROPOFOL N/A 10/06/2015   Procedure: ESOPHAGOGASTRODUODENOSCOPY (EGD) WITH PROPOFOL;  Surgeon: Mauri Pole, MD;  Location: [facility] [facility];  Service: Endoscopy;  Laterality: N/A;  Rigiflex ballon size 70m-35mm size 45 minute proc,need Fluro Gastografin esophagram 2 hrs post EGD    GASTRIC ROUX-EN-Y N/A 04/05/2020   Procedure: LAPAROSCOPIC ROUX-EN-Y GASTRIC BYPASS WITH UPPER ENDOSCOPY;  Surgeon: WGreer Pickerel MD;  Location: [facility];  Service: General;  Laterality:  N/A;   PACEMAKER INSERTION     POLYPECTOMY  05/06/2018   Procedure: POLYPECTOMY;  Surgeon: Mansouraty, Telford Nab., MD;  Location: [place];  Service: Gastroenterology;;   Betsy Pries GENERATOR CHANGEOUT N/A 05/01/2018   Procedure: PPM GENERATOR CHANGEOUT;  Surgeon: Deboraha Sprang, MD;  Location: [facility];  Service:  Cardiovascular;  Laterality: N/A;    Current Medications: Current Meds  Medication Sig   Blood Glucose Monitoring Suppl (ONETOUCH VERIO) w/Device KIT Inject 1 kit into the skin every morning. Use to test blood sugar each day before breakfast and 2 hours after a meal for 2 weeks.  Diagnosis:  E11.9  Non-insulin dependent.   Calcium Carbonate (CALCIUM 500 PO) Take 1,500 mg by mouth daily.   Camphor-Menthol-Methyl Sal (SALONPAS) 3.06-03-08 % PTCH Apply 1 patch topically daily as needed (pain).   HYDROcodone-acetaminophen (NORCO/VICODIN) 5-325 MG tablet Take 1 tablet by mouth every 8 (eight) hours as needed.   Lancets (ONETOUCH ULTRASOFT) lancets USE TO TEST BLOOD SUGAR ONCE DAILY OR AS NEEDED   Multiple Vitamins-Minerals (BARIATRIC MULTIVITAMINS/IRON PO) Take 2 tablets by mouth daily.   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   Occupation: singer    Employer: [employer]  Tobacco Use   Smoking status: Former    Packs/day: 1.00    Years: 20.00    Pack years: 20.00    Types: Cigarettes    Quit date: 05/29/2004    Years since quitting: 16.8   Smokeless tobacco: Never  Vaping Use   Vaping Use: Never used  Substance and Sexual Activity   Alcohol use: Yes    Comment: rarely   Drug use: No   Sexual activity: Yes  Other Topics Concern   Not on file  Social History Narrative   From Air cabin crew   Married   Social Determinants of Health   Financial Resource Strain: Not on file  Food Insecurity: Not on file  Transportation Needs: Not on file  Physical Activity: Not on file  Stress: Not on file  Social Connections: Not on file      Family History:  The patient's  family history includes Cancer in his father; Dementia in his mother; Heart attack in his father; Heart disease in his father; Hypertension in his father and mother; Prostate cancer in his father.   ROS:   Please see the history of present illness.    ROS All other systems reviewed and are negative.   PHYSICAL EXAM:   VS:  BP 140/74   Pulse 60   Ht 5' 11.5" (1.816 m)   Wt (!) 315 lb (142.9 kg)   SpO2 98%   BMI 43.32 kg/m   Physical Exam  GEN: Obese, in no acute distress  Neck: no JVD, carotid bruits, or masses Cardiac:RRR; no murmurs, rubs, or gallops  Respiratory:  clear to auscultation bilaterally, normal work of breathing GI: soft, nontender, nondistended, + BS Ext: without cyanosis, clubbing, or edema, Good distal pulses bilaterally Neuro:  Alert and Oriented x 3,  Psych: euthymic mood, full affect  Wt Readings  from Last 3 Encounters:  04/13/21 (!) 315 lb (142.9 kg)  02/28/21 (!) 302 lb (137 kg)  02/21/21 (!) 304 lb (137.9 kg)      Studies/Labs Reviewed:   EKG:  EKG is  ordered today.  The ekg ordered today demonstrates atrial paced unchanged  Recent Labs: 01/03/2021: ALT 22; BUN 13; Creatinine, Ser 0.83; Hemoglobin 14.6; Platelets 255.0; Potassium 4.4; Sodium 139; TSH 1.61   Lipid Panel    Component Value Date/Time   CHOL 163 08/07/2019 0834   TRIG 384.0 (H) 08/07/2019 0834   HDL 32.60 (L) 08/07/2019 0834   CHOLHDL 5 08/07/2019 0834   VLDL 76.8 (H) 08/07/2019 0834   LDLDIRECT 80.0 08/07/2019 0834    Additional studies/ records that were reviewed today include:  Lexiscan Myoview 11/13/2019 The left ventricular ejection fraction is normal (55-65%). Nuclear stress EF: 63%. There was no ST segment deviation noted during stress. The study is normal. This is a low risk study   Risk Assessment/Calculations:         ASSESSMENT:    1. Preop cardiovascular exam   2. PACEMAKER, St Judes   3. Essential hypertension   4.  Morbid obesity ([place])   5. Type 2 diabetes mellitus with diabetic neuropathy, without long-term current use of insulin (HCC)      PLAN:  In order of problems listed above:  Preoperative clearance for total knee arthroplasty 04/25/2021 by Dr. Gaynelle Arabian. Patient without cardiac complaints or history of CAD. Does have pacemaker-standard preop pacemaker protocol. METS if >6. Patient can proceed with surgery without further cardiac work up. According to the Revised Cardiac Risk Index (RCRI), his Perioperative Risk of Major Cardiac Event is (%): 0.4  His Functional Capacity in METs is: 6.02 according to the [facility] Activity Status Index (DASI).   Sinus node dysfunction status post pacemaker, pacer check 10/13/2020 battery life is good lead measurement unchanged and histograms were appropriate. He is due for a remote check that he missed while out of town  Morbid obesity bariatric surgery-has lost close to 100 lbs  Hypertension BP slightly up today but usually normal  DM2-no longer on meds since weight loss  HLD-recommend FLP -to have at PCP   Shared Decision Making/Informed Consent        Medication Adjustments/Labs and Tests Ordered: Current medicines are reviewed at length with the patient today.  Concerns regarding medicines are outlined above.  Medication changes, Labs and Tests ordered today are listed in the Patient Instructions below. Patient Instructions  Medication Instructions:  Your physician recommends that you continue on your current medications as directed. Please refer to the Current Medication list given to you today.  *If you need a refill on your cardiac medications before your next appointment, please call your pharmacy*   Lab Work: None If you have labs (blood work) drawn today and your tests are completely normal, you will receive your results only by: [place] (if you have MyChart) OR A paper copy in the mail If you have any lab test that is abnormal  or we need to change your treatment, we will call you to review the results.   Follow-Up: At [facility], you and your health needs are our priority.  As part of our continuing mission to provide you with exceptional heart care, we have created designated Provider Care Teams.  These Care Teams include your primary Cardiologist (physician) and Advanced Practice Providers (APPs -  Physician Assistants and Nurse Practitioners) who all work together to provide  you with the care you need, when you need it.   Your next appointment:   6 month(s)  The format for your next appointment:   In Person  Provider:   Dr Minus Breeding     Signed, Ermalinda Barrios, PA-C  04/13/2021 [address], [place], [place]  [address] Phone: [phone]; Fax: [phone]

## 2021-04-07 ENCOUNTER — Ambulatory Visit: Payer: 59

## 2021-04-11 ENCOUNTER — Telehealth: Payer: Self-pay | Admitting: Family Medicine

## 2021-04-11 MED ORDER — HYDROCODONE-ACETAMINOPHEN 5-325 MG PO TABS: 1.0000 | ORAL_TABLET | Freq: Three times a day (TID) | ORAL | 0 refills | Status: DC | PRN

## 2021-04-11 NOTE — Telephone Encounter (Signed)
Refilled norco.   Rosemarie Ax, MD [facility] 04/11/2021, 10:50 AM

## 2021-04-11 NOTE — Telephone Encounter (Signed)
Forwarding request to provider for Rx refill of  : (Took last one today from 02/14/21 HYDROcodone-acetaminophen (NORCO/VICODIN) 5-325 MG tablet [[id number]]    Order Details Dose: 1 tablet Route: Oral Frequency: Every 8 hours PRN  Dispense Quantity: 15 tablet Refills: 0        Sig: Take 1 tablet by mouth every 8 (eight) hours as needed.        --Send to :    Pharmacy  [id number] - [place], [address]  [place], [place] [address]  Phone:  [phone]  Fax:  [phone]   --Dion Body

## 2021-04-13 ENCOUNTER — Encounter: Payer: Self-pay | Admitting: Physician Assistant

## 2021-04-13 ENCOUNTER — Ambulatory Visit (INDEPENDENT_AMBULATORY_CARE_PROVIDER_SITE_OTHER): Payer: BC Managed Care – PPO | Admitting: Physician Assistant

## 2021-04-13 ENCOUNTER — Other Ambulatory Visit: Payer: Self-pay

## 2021-04-13 VITALS — BP 140/74 | HR 60 | Ht 71.5 in | Wt 315.0 lb

## 2021-04-13 DIAGNOSIS — E1140 Type 2 diabetes mellitus with diabetic neuropathy, unspecified: Secondary | ICD-10-CM

## 2021-04-13 DIAGNOSIS — Z01810 Encounter for preprocedural cardiovascular examination: Secondary | ICD-10-CM

## 2021-04-13 DIAGNOSIS — Z950 Presence of cardiac pacemaker: Secondary | ICD-10-CM

## 2021-04-13 DIAGNOSIS — I10 Essential (primary) hypertension: Secondary | ICD-10-CM

## 2021-04-13 NOTE — Progress Notes (Signed)
Your procedure is scheduled on:    04/25/21   Report to [facility] Main  Entrance   Report to admitting at   0730am      Call this number if you have problems the morning of surgery [phone]    REMEMBER: NO  SOLID FOOD CANDY OR GUM AFTER MIDNIGHT. CLEAR LIQUIDS UNTIL    1030am        . NOTHING BY MOUTH EXCEPT CLEAR LIQUIDS UNTIL 1030am    . PLEASE FINISH ENSURE DRINK PER SURGEON ORDER  WHICH NEEDS TO BE COMPLETED AT      .  1030am     CLEAR LIQUID DIET   Foods Allowed                                                                    Coffee and tea, regular and decaf                            Fruit ices (not with fruit pulp)                                      Iced Popsicles                                    Carbonated beverages, regular and diet                                    Cranberry, grape and apple juices Sports drinks like Gatorade Lightly seasoned clear broth or consume(fat free) Sugar, honey syrup ___________________________________________________________________      BRUSH YOUR TEETH MORNING OF SURGERY AND RINSE YOUR MOUTH OUT, NO CHEWING GUM CANDY OR MINTS.     Take these medicines the morning of surgery with A SIP OF WATER:   none   DO NOT TAKE ANY DIABETIC MEDICATIONS DAY OF YOUR SURGERY                               You may not have any metal on your body including hair pins and              piercings  Do not wear jewelry, make-up, lotions, powders or perfumes, deodorant             Do not wear nail polish on your fingernails.  Do not shave  48 hours prior to surgery.              Men may shave face and neck.   Do not bring valuables to the hospital. [place].  Contacts, dentures or bridgework may not be worn into surgery.  Leave suitcase in the car. After surgery it may be brought to your room.     Patients discharged the day of surgery will not  be allowed to drive home.  IF YOU ARE HAVING SURGERY AND GOING HOME THE SAME DAY, YOU MUST HAVE AN ADULT TO DRIVE YOU HOME AND BE WITH YOU FOR 24 HOURS. YOU MAY GO HOME BY TAXI OR UBER OR ORTHERWISE, BUT AN ADULT MUST ACCOMPANY YOU HOME AND STAY WITH YOU FOR 24 HOURS.  Name and phone number of your driver:  Special Instructions: N/A              Please read over the following fact sheets you were given: _____________________________________________________________________  [facility] - Preparing for Surgery Before surgery, you can play an important role.  Because skin is not sterile, your skin needs to be as free of germs as possible.  You can reduce the number of germs on your skin by washing with CHG (chlorahexidine gluconate) soap before surgery.  CHG is an antiseptic cleaner which kills germs and bonds with the skin to continue killing germs even after washing. Please DO NOT use if you have an allergy to CHG or antibacterial soaps.  If your skin becomes reddened/irritated stop using the CHG and inform your nurse when you arrive at Short Stay. Do not shave (including legs and underarms) for at least 48 hours prior to the first CHG shower.  You may shave your face/neck. Please follow these instructions carefully:  1.  Shower with CHG Soap the night before surgery and the  morning of Surgery.  2.  If you choose to wash your hair, wash your hair first as usual with your  normal  shampoo.  3.  After you shampoo, rinse your hair and body thoroughly to remove the  shampoo.                           4.  Use CHG as you would any other liquid soap.  You can apply chg directly  to the skin and wash                       Gently with a scrungie or clean washcloth.  5.  Apply the CHG Soap to your body ONLY FROM THE NECK DOWN.   Do not use on face/ open                           Wound or open sores. Avoid contact with eyes, ears mouth and genitals (private parts).                       Wash face,  Genitals (private parts) with your  normal soap.             6.  Wash thoroughly, paying special attention to the area where your surgery  will be performed.  7.  Thoroughly rinse your body with warm water from the neck down.  8.  DO NOT shower/wash with your normal soap after using and rinsing off  the CHG Soap.                9.  Pat yourself dry with a clean towel.            10.  Wear clean pajamas.            11.  Place clean sheets on your bed the night of your first shower and do not  sleep with pets. Day of Surgery : Do not apply  any lotions/deodorants the morning of surgery.  Please wear clean clothes to the hospital/surgery center.  FAILURE TO FOLLOW THESE INSTRUCTIONS MAY RESULT IN THE CANCELLATION OF YOUR SURGERY PATIENT SIGNATURE_________________________________  NURSE SIGNATURE__________________________________  ________________________________________________________________________

## 2021-04-13 NOTE — Progress Notes (Addendum)
Anesthesia Review:  PCP: DR Elsie Stain  Cardiologist : DR Caryl Comes  04/13/21- preop eval by Estella Husk- PA  Pacemaker Last device check on 10/13/20 and on 04/15/21  and 04/18/21 per pt  Device orders requested on 04/18/21  Chest x-ray :11/19/20  EKG :12/15/20  10/13/20- Device check  Echo : Stress test:11/13/19  Cardiac Cath :  Activity level: can doa  flight of stairs iwthout difficulty  Sleep Study/ CPAP : has bipap  Fasting Blood Sugar :      / Checks Blood Sugar -- times a day:   Blood Thinner/ Instructions /Last Dose: ASA / Instructions/ Last Dose :   Covid test DOS- Arrive 0730  DM- type 2- checks glucose once per week  Currently on no meds for diabetes  Hgba1c-04/18/21-5.9

## 2021-04-13 NOTE — Patient Instructions (Signed)
Medication Instructions:  Your physician recommends that you continue on your current medications as directed. Please refer to the Current Medication list given to you today.  *If you need a refill on your cardiac medications before your next appointment, please call your pharmacy*   Lab Work: None If you have labs (blood work) drawn today and your tests are completely normal, you will receive your results only by: [place] (if you have MyChart) OR A paper copy in the mail If you have any lab test that is abnormal or we need to change your treatment, we will call you to review the results.   Follow-Up: At [facility], you and your health needs are our priority.  As part of our continuing mission to provide you with exceptional heart care, we have created designated Provider Care Teams.  These Care Teams include your primary Cardiologist (physician) and Advanced Practice Providers (APPs -  Physician Assistants and Nurse Practitioners) who all work together to provide you with the care you need, when you need it.   Your next appointment:   6 month(s)  The format for your next appointment:   In Person  Provider:   Dr Minus Breeding

## 2021-04-18 ENCOUNTER — Encounter: Payer: Self-pay | Admitting: Internal Medicine

## 2021-04-18 ENCOUNTER — Ambulatory Visit (INDEPENDENT_AMBULATORY_CARE_PROVIDER_SITE_OTHER): Payer: BC Managed Care – PPO

## 2021-04-18 ENCOUNTER — Encounter (HOSPITAL_COMMUNITY): Payer: Self-pay

## 2021-04-18 ENCOUNTER — Encounter (HOSPITAL_COMMUNITY)
Admission: RE | Admit: 2021-04-18 | Discharge: 2021-04-18 | Disposition: A | Discharge status: Home / Self Care | Payer: BC Managed Care – PPO | Source: Ambulatory Visit | Attending: Orthopedic Surgery | Admitting: Orthopedic Surgery

## 2021-04-18 ENCOUNTER — Other Ambulatory Visit: Payer: Self-pay

## 2021-04-18 VITALS — BP 141/89 | HR 59 | Temp 98.0°F | Resp 16 | Ht 72.0 in | Wt 315.0 lb

## 2021-04-18 DIAGNOSIS — I480 Paroxysmal atrial fibrillation: Secondary | ICD-10-CM

## 2021-04-18 DIAGNOSIS — E1140 Type 2 diabetes mellitus with diabetic neuropathy, unspecified: Secondary | ICD-10-CM | POA: Diagnosis not present

## 2021-04-18 DIAGNOSIS — Z01812 Encounter for preprocedural laboratory examination: Secondary | ICD-10-CM | POA: Diagnosis present

## 2021-04-18 DIAGNOSIS — Z01818 Encounter for other preprocedural examination: Secondary | ICD-10-CM

## 2021-04-18 HISTORY — DX: Unspecified osteoarthritis, unspecified site: M19.90

## 2021-04-18 LAB — CBC
HCT: 48.5 % (ref 39.0–52.0)
Hemoglobin: 15.9 g/dL (ref 13.0–17.0)
MCH: 30 pg (ref 26.0–34.0)
MCHC: 32.8 g/dL (ref 30.0–36.0)
MCV: 91.5 fL (ref 80.0–100.0)
Platelets: 224 10*3/uL (ref 150–400)
RBC: 5.3 MIL/uL (ref 4.22–5.81)
RDW: 13.2 % (ref 11.5–15.5)
WBC: 8 10*3/uL (ref 4.0–10.5)
nRBC: 0 % (ref 0.0–0.2)

## 2021-04-18 LAB — SURGICAL PCR SCREEN
MRSA, PCR: NEGATIVE
Staphylococcus aureus: NEGATIVE

## 2021-04-18 LAB — COMPREHENSIVE METABOLIC PANEL
ALT: 23 U/L (ref 0–44)
AST: 21 U/L (ref 15–41)
Albumin: 3.9 g/dL (ref 3.5–5.0)
Alkaline Phosphatase: 99 U/L (ref 38–126)
Anion gap: 5 (ref 5–15)
BUN: 9 mg/dL (ref 8–23)
CO2: 26 mmol/L (ref 22–32)
Calcium: 9.6 mg/dL (ref 8.9–10.3)
Chloride: 107 mmol/L (ref 98–111)
Creatinine, Ser: 0.71 mg/dL (ref 0.61–1.24)
GFR, Estimated: >60 mL/min (ref >60)
Glucose, Bld: 134 mg/dL — ABNORMAL HIGH (ref 70–99)
Potassium: 4.2 mmol/L (ref 3.5–5.1)
Sodium: 138 mmol/L (ref 135–145)
Total Bilirubin: 0.8 mg/dL (ref 0.3–1.2)
Total Protein: 6.9 g/dL (ref 6.5–8.1)

## 2021-04-18 LAB — HEMOGLOBIN A1C
Hgb A1c MFr Bld: 5.9 % — ABNORMAL HIGH (ref 4.8–5.6)
Mean Plasma Glucose: 122.63 mg/dL

## 2021-04-18 LAB — PROTIME-INR
INR: 1 (ref 0.8–1.2)
Prothrombin Time: 13.6 seconds (ref 11.4–15.2)

## 2021-04-18 LAB — GLUCOSE, CAPILLARY: Glucose-Capillary: 128 mg/dL — ABNORMAL HIGH (ref 70–99)

## 2021-04-18 NOTE — Progress Notes (Signed)
PERIOPERATIVE PRESCRIPTION FOR IMPLANTED CARDIAC DEVICE PROGRAMMING  Patient Information: Name:  Glenn Lyons  DOB:  1956/03/26  MRN:  [id number]    Planned Procedure:  left total knee replacement  Surgeon:  Dr Gaynelle Arabian  Date of Procedure:  04/25/21  Cautery will be used.  Position during surgery:   Please send documentation back to:  [facility] (Fax # [phone]) Device Information:  Clinic EP Physician:  Virl Axe, MD   Device Type:  Pacemaker Manufacturer and Phone #:  St. Jude/Abbott: [phone] Pacemaker Dependent?:  No. Date of Last Device Check:  07/15/20 Normal Device Function?:  Yes.    Electrophysiologist's Recommendations:  Have magnet available. Provide continuous ECG monitoring when magnet is used or reprogramming is to be performed.  Procedure should not interfere with device function.  No device programming or magnet placement needed.  Per Device Clinic Standing Orders, Wanda Plump, RN  1:04 PM 04/18/2021

## 2021-04-19 ENCOUNTER — Encounter: Payer: Self-pay | Admitting: Family Medicine

## 2021-04-19 ENCOUNTER — Telehealth: Payer: Self-pay | Admitting: Family Medicine

## 2021-04-19 LAB — CUP PACEART REMOTE DEVICE CHECK
Battery Remaining Longevity: 97 mo
Battery Remaining Percentage: 78 %
Battery Voltage: 2.99 V
Brady Statistic AP VP Percent: 1 %
Brady Statistic AP VS Percent: 60 %
Brady Statistic AS VP Percent: 1 %
Brady Statistic AS VS Percent: 40 %
Brady Statistic RA Percent Paced: 59 %
Brady Statistic RV Percent Paced: 1 %
Date Time Interrogation Session: 20221121080222
Implantable Lead Implant Date: 20061219
Implantable Lead Implant Date: 20061219
Implantable Lead Location: 753859
Implantable Lead Location: 753860
Implantable Pulse Generator Implant Date: 20191204
Lead Channel Impedance Value: 1275 Ohm
Lead Channel Impedance Value: 490 Ohm
Lead Channel Pacing Threshold Amplitude: 1.125 V
Lead Channel Pacing Threshold Amplitude: 1.375 V
Lead Channel Pacing Threshold Pulse Width: 0.5 ms
Lead Channel Pacing Threshold Pulse Width: 0.8 ms
Lead Channel Sensing Intrinsic Amplitude: 5 mV
Lead Channel Sensing Intrinsic Amplitude: 8.6 mV
Lead Channel Setting Pacing Amplitude: 1.375
Lead Channel Setting Pacing Amplitude: 2.375
Lead Channel Setting Pacing Pulse Width: 0.5 ms
Lead Channel Setting Sensing Sensitivity: 2.5 mV
Pulse Gen Model: 2272
Pulse Gen Serial Number: 9090970

## 2021-04-19 NOTE — Telephone Encounter (Signed)
Patient called states his Knee replacement surgery was delayed because of Weight gain beyond the allow safety requirement.  -- Patient ask if he can get a Zilretta injection  Left knee due to this delay.Pt is willing to wait the required time between Injection & rescheduled Replacement.  Per patient  Rt knee Zilretta has lasted for months (given 02/14/21)and says he is really impressed that it helped so long.  -- Advised pt would forward message to provider --  Patient says you can let him know t Decision thru [place].   -- [facility] appt scheduled pt for Monday 11/28 for Zilretta.   --glh

## 2021-04-25 ENCOUNTER — Ambulatory Visit (HOSPITAL_COMMUNITY)
Admission: RE | Admit: 2021-04-25 | Payer: BC Managed Care – PPO | Source: Home / Self Care | Admitting: Orthopedic Surgery

## 2021-04-25 ENCOUNTER — Ambulatory Visit: Payer: Self-pay

## 2021-04-25 ENCOUNTER — Ambulatory Visit: Payer: BC Managed Care – PPO | Admitting: Family Medicine

## 2021-04-25 ENCOUNTER — Encounter: Payer: Self-pay | Admitting: Family Medicine

## 2021-04-25 ENCOUNTER — Encounter (HOSPITAL_COMMUNITY): Admission: RE | Payer: Self-pay | Source: Home / Self Care

## 2021-04-25 VITALS — BP 138/88 | Ht 72.0 in | Wt 305.0 lb

## 2021-04-25 DIAGNOSIS — M1712 Unilateral primary osteoarthritis, left knee: Secondary | ICD-10-CM

## 2021-04-25 LAB — TYPE AND SCREEN
ABO/RH(D): A POS
Antibody Screen: NEGATIVE

## 2021-04-25 SURGERY — ARTHROPLASTY, KNEE, TOTAL
Anesthesia: Choice | Site: Knee | Laterality: Left

## 2021-04-25 NOTE — Assessment & Plan Note (Signed)
Acute on chronic in nature. -Counseled on home exercise therapy and supportive care. -Zilretta injection today.

## 2021-04-25 NOTE — Patient Instructions (Signed)
Good to see you Please use ice as needed  Please send me a message in MyChart with any questions or updates.  Please see me back as needed.   --Dr. Layson Bertsch  

## 2021-04-25 NOTE — Progress Notes (Signed)
COSTANTINO Lyons - 65 y.o. male MRN [id number]  Date of birth: [birth year]  SUBJECTIVE:  Including CC & ROS.  No chief complaint on file.   Glenn Lyons is a 65 y.o. male that is presenting with acute on chronic left knee pain.  Pain is severe in nature and over the medial compartment.  Surgery has been delayed at this time.   Review of Systems See HPI   HISTORY: Past Medical, Surgical, Social, and Family History Reviewed & Updated per EMR.   Pertinent Historical Findings include:  Past Medical History:  Diagnosis Date   Achalasia    with prev eval at [facility].  No intervention as of 2012   Arthritis    Backache, unspecified    Cardiac pacemaker St. Jude    ERI 2008   Depressive disorder, not elsewhere classified    Diabetes ([place])    Morbid obesity ([place])    Obstructive sleep apnea    biapap settign 25-22   Presence of permanent cardiac pacemaker    St. Jude- Dr. Caryl Comes follows -device Check 07-06-14   Sinus bradycardia /pauses    Stricture and stenosis of esophagus     Past Surgical History:  Procedure Laterality Date   BALLOON DILATION N/A 10/06/2015   Procedure: BALLOON DILATION;  Surgeon: Mauri Pole, MD;  Location: [place] [facility];  Service: Endoscopy;  Laterality: N/A;   BIOPSY  05/06/2018   Procedure: BIOPSY;  Surgeon: Rush Landmark Telford Nab., MD;  Location: [place];  Service: Gastroenterology;;   COLONOSCOPY W/ POLYPECTOMY  11/01/2010   COLONOSCOPY WITH PROPOFOL N/A 10/05/2014   Procedure: COLONOSCOPY WITH PROPOFOL;  Surgeon: Gatha Mayer, MD;  [facility];  Service: Endoscopy;  Laterality: N/A;   COLONOSCOPY WITH PROPOFOL N/A 05/06/2018   Procedure: COLONOSCOPY WITH PROPOFOL;  Surgeon: Rush Landmark Telford Nab., MD;  Location: [place];  Service: Gastroenterology;  Laterality: N/A;   ESOPHAGEAL DILATION     ESOPHAGEAL MANOMETRY N/A 08/23/2015   Procedure: ESOPHAGEAL MANOMETRY (EM);  Surgeon: Mauri Pole, MD;  [facility];  Service:  Endoscopy;  Laterality: N/A;   ESOPHAGOGASTRODUODENOSCOPY N/A 10/09/2014   Procedure: ESOPHAGOGASTRODUODENOSCOPY (EGD);  Surgeon: Gatha Mayer, MD;  Location[facility];  Service: Endoscopy;  Laterality: N/A;   ESOPHAGOGASTRODUODENOSCOPY (EGD) WITH PROPOFOL N/A 08/23/2015   Procedure: ESOPHAGOGASTRODUODENOSCOPY (EGD) WITH PROPOFOL;  Surgeon: Mauri Pole, MD;  [facility];  Service: Endoscopy;  Laterality: N/A;   ESOPHAGOGASTRODUODENOSCOPY (EGD) WITH PROPOFOL N/A 10/06/2015   Procedure: ESOPHAGOGASTRODUODENOSCOPY (EGD) WITH PROPOFOL;  Surgeon: Mauri Pole, MD;  Location: [place] [facility];  Service: Endoscopy;  Laterality: N/A;  Rigiflex ballon size 63m-35mm size 45 minute proc,need Fluro Gastografin esophagram 2 hrs post EGD    GASTRIC ROUX-EN-Y N/A 04/05/2020   Procedure: LAPAROSCOPIC ROUX-EN-Y GASTRIC BYPASS WITH UPPER ENDOSCOPY;  Surgeon: WGreer Pickerel MD;  [facility];  Service: General;  Laterality: N/A;   PACEMAKER INSERTION     POLYPECTOMY  05/06/2018   Procedure: POLYPECTOMY;  Surgeon: MIrving Copas, MD;  [place]  Service: Gastroenterology;;   PBetsy PriesGENERATOR CHANGEOUT N/A 05/01/2018   Procedure: PPM GENERATOR CHANGEOUT;  Surgeon: KDeboraha Sprang MD;  [facility];  Service: Cardiovascular;  Laterality: N/A;    Family History  Problem Relation Age of Onset   Heart attack Father    Heart disease Father    Cancer Father        multiple myeloma   Hypertension Father    Prostate cancer Father  possible dx   Hypertension Mother    Dementia Mother    Colon cancer Neg Hx     Social History   Socioeconomic History   Marital status: Married    Spouse name: Not on file   Number of children: 2   Years of education: Not on file   Highest education level: Not on file  Occupational History   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   Occupation: singer    Employer: [employer]  Tobacco Use   Smoking status: Former     Packs/day: 1.00    Years: 20.00    Pack years: 20.00    Types: Cigarettes    Quit date: 05/29/2004    Years since quitting: 16.9   Smokeless tobacco: Never  Vaping Use   Vaping Use: Never used  Substance and Sexual Activity   Alcohol use: Never   Drug use: No   Sexual activity: Yes  Other Topics Concern   Not on file  Social History Narrative   From Air cabin crew   Married   Social Determinants of Health   Financial Resource Strain: Not on file  Food Insecurity: Not on file  Transportation Needs: Not on file  Physical Activity: Not on file  Stress: Not on file  Social Connections: Not on file  Intimate Partner Violence: Not on file     PHYSICAL EXAM:  VS: BP 138/88 (BP Location: Left Arm, Patient Position: Sitting)   Ht 6' (1.829 m)   Wt (!) 305 lb (138.3 kg)   BMI 41.37 kg/m  Physical Exam Gen: NAD, alert, cooperative with exam, well-appearing    Aspiration/Injection Procedure Note Glenn Lyons [birth year]  Procedure: Aspiration and Injection Indications: left knee pain   Procedure Details Consent: Risks of procedure as well as the alternatives and risks of each were explained to the (patient/caregiver).  Consent for procedure obtained. Time Out: Verified patient identification, verified procedure, site/side was marked, verified correct patient position, special equipment/implants available, medications/allergies/relevent history reviewed, required imaging and test results available.  Performed.  The area was cleaned with iodine and alcohol swabs.    The left knee superior lateral suprapatellar pouch was injected using 3 cc of 1% lidocaine on a 25-gauge 1-1/2 inch needle.  An 18-gauge 1-1/2 needle was used to achieve aspiration.  The syringe was switched and a mixture containing 5 cc's of 32 mg Zilretta and 4 cc's of 0.25% bupivacaine was injected.  Ultrasound was used. Images were obtained in long views showing the injection.   Amount of Fluid  Aspirated:  79m Character of Fluid: clear and straw colored Fluid was sent for: n/a  A sterile dressing was applied.  Patient did tolerate procedure well.     ASSESSMENT & PLAN:   OA (osteoarthritis) of knee Acute on chronic in nature. -Counseled on home exercise therapy and supportive care. -Zilretta injection today.

## 2021-04-27 NOTE — Progress Notes (Signed)
Remote pacemaker transmission.   

## 2021-05-05 ENCOUNTER — Ambulatory Visit: Payer: BC Managed Care – PPO | Admitting: Family Medicine

## 2021-05-05 ENCOUNTER — Ambulatory Visit: Payer: Self-pay

## 2021-05-05 ENCOUNTER — Encounter: Payer: Self-pay | Admitting: Family Medicine

## 2021-05-05 VITALS — Ht 72.0 in | Wt 305.0 lb

## 2021-05-05 DIAGNOSIS — M1711 Unilateral primary osteoarthritis, right knee: Secondary | ICD-10-CM | POA: Diagnosis not present

## 2021-05-05 NOTE — Progress Notes (Signed)
Glenn Lyons - 65 y.o. male MRN [id number]  Date of birth: [birth year]  SUBJECTIVE:  Including CC & ROS.  No chief complaint on file.   Glenn Lyons is a 65 y.o. male that is  here for zilretta injections.   Review of Systems See HPI   HISTORY: Past Medical, Surgical, Social, and Family History Reviewed & Updated per EMR.   Pertinent Historical Findings include:  Past Medical History:  Diagnosis Date   Achalasia    with prev eval at [facility].  No intervention as of 2012   Arthritis    Backache, unspecified    Cardiac pacemaker St. Jude    ERI 2008   Depressive disorder, not elsewhere classified    Diabetes ([place])    Morbid obesity ([place])    Obstructive sleep apnea    biapap settign 25-22   Presence of permanent cardiac pacemaker    St. Jude- Dr. Caryl Comes follows -device Check 07-06-14   Sinus bradycardia /pauses    Stricture and stenosis of esophagus     Past Surgical History:  Procedure Laterality Date   BALLOON DILATION N/A 10/06/2015   Procedure: BALLOON DILATION;  Surgeon: Mauri Pole, MD;  Location: [place] [facility];  Service: Endoscopy;  Laterality: N/A;   BIOPSY  05/06/2018   Procedure: BIOPSY;  Surgeon: Rush Landmark Telford Nab., MD;  Location: [place];  Service: Gastroenterology;;   COLONOSCOPY W/ POLYPECTOMY  11/01/2010   COLONOSCOPY WITH PROPOFOL N/A 10/05/2014   Procedure: COLONOSCOPY WITH PROPOFOL;  Surgeon: Gatha Mayer, MD;  [facility];  Service: Endoscopy;  Laterality: N/A;   COLONOSCOPY WITH PROPOFOL N/A 05/06/2018   Procedure: COLONOSCOPY WITH PROPOFOL;  Surgeon: Rush Landmark Telford Nab., MD;  Location: [place];  Service: Gastroenterology;  Laterality: N/A;   ESOPHAGEAL DILATION     ESOPHAGEAL MANOMETRY N/A 08/23/2015   Procedure: ESOPHAGEAL MANOMETRY (EM);  Surgeon: Mauri Pole, MD;  [facility];  Service: Endoscopy;  Laterality: N/A;   ESOPHAGOGASTRODUODENOSCOPY N/A 10/09/2014   Procedure: ESOPHAGOGASTRODUODENOSCOPY (EGD);   Surgeon: Gatha Mayer, MD;  Location[facility];  Service: Endoscopy;  Laterality: N/A;   ESOPHAGOGASTRODUODENOSCOPY (EGD) WITH PROPOFOL N/A 08/23/2015   Procedure: ESOPHAGOGASTRODUODENOSCOPY (EGD) WITH PROPOFOL;  Surgeon: Mauri Pole, MD;  [facility];  Service: Endoscopy;  Laterality: N/A;   ESOPHAGOGASTRODUODENOSCOPY (EGD) WITH PROPOFOL N/A 10/06/2015   Procedure: ESOPHAGOGASTRODUODENOSCOPY (EGD) WITH PROPOFOL;  Surgeon: Mauri Pole, MD;  Location: [place] [facility];  Service: Endoscopy;  Laterality: N/A;  Rigiflex ballon size 24m-35mm size 45 minute proc,need Fluro Gastografin esophagram 2 hrs post EGD    GASTRIC ROUX-EN-Y N/A 04/05/2020   Procedure: LAPAROSCOPIC ROUX-EN-Y GASTRIC BYPASS WITH UPPER ENDOSCOPY;  Surgeon: WGreer Pickerel MD;  [facility];  Service: General;  Laterality: N/A;   PACEMAKER INSERTION     POLYPECTOMY  05/06/2018   Procedure: POLYPECTOMY;  Surgeon: MIrving Copas, MD;  [place]  Service: Gastroenterology;;   PBetsy PriesGENERATOR CHANGEOUT N/A 05/01/2018   Procedure: PPM GENERATOR CHANGEOUT;  Surgeon: KDeboraha Sprang MD;  [facility];  Service: Cardiovascular;  Laterality: N/A;    Family History  Problem Relation Age of Onset   Heart attack Father    Heart disease Father    Cancer Father        multiple myeloma   Hypertension Father    Prostate cancer Father        possible dx   Hypertension Mother    Dementia Mother    Colon  cancer Neg Hx     Social History   Socioeconomic History   Marital status: Married    Spouse name: Not on file   Number of children: 2   Years of education: Not on file   Highest education level: Not on file  Occupational History   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   Occupation: singer    Employer: [employer]  Tobacco Use   Smoking status: Former    Packs/day: 1.00    Years: 20.00    Pack years: 20.00    Types: Cigarettes    Quit date: 05/29/2004    Years since  quitting: 16.9   Smokeless tobacco: Never  Vaping Use   Vaping Use: Never used  Substance and Sexual Activity   Alcohol use: Never   Drug use: No   Sexual activity: Yes  Other Topics Concern   Not on file  Social History Narrative   From Air cabin crew   Married   Social Determinants of Health   Financial Resource Strain: Not on file  Food Insecurity: Not on file  Transportation Needs: Not on file  Physical Activity: Not on file  Stress: Not on file  Social Connections: Not on file  Intimate Partner Violence: Not on file     PHYSICAL EXAM:  VS: Ht 6' (1.829 m)   Wt (!) 305 lb (138.3 kg)   BMI 41.37 kg/m  Physical Exam Gen: NAD, alert, cooperative with exam, well-appearing    Aspiration/Injection Procedure Note Glenn Lyons Oct 06, 1955  Procedure: Injection Indications: right knee pain   Procedure Details Consent: Risks of procedure as well as the alternatives and risks of each were explained to the (patient/caregiver).  Consent for procedure obtained. Time Out: Verified patient identification, verified procedure, site/side was marked, verified correct patient position, special equipment/implants available, medications/allergies/relevent history reviewed, required imaging and test results available.  Performed.  The area was cleaned with iodine and alcohol swabs.    The right knee superior lateral suprapatellar pouch was injected using 3 cc of 1% lidocaine on a 21-gauge 2 inch needle.    The syringe was switched and a mixture containing 5 cc's of 32 mg Zilretta and 4 cc's of 0.25% bupivacaine was injected.  Ultrasound was used. Images were obtained in long views showing the injection.    A sterile dressing was applied.  Patient did tolerate procedure well.     ASSESSMENT & PLAN:   OA (osteoarthritis) of knee Completed right knee zilretta injection today.

## 2021-05-05 NOTE — Assessment & Plan Note (Signed)
Completed right knee zilretta injection today.

## 2021-05-16 ENCOUNTER — Telehealth: Payer: Self-pay | Admitting: Family Medicine

## 2021-05-16 MED ORDER — HYDROCODONE-ACETAMINOPHEN 5-325 MG PO TABS: 1.0000 | ORAL_TABLET | Freq: Three times a day (TID) | ORAL | 0 refills | Status: DC | PRN

## 2021-05-16 NOTE — Telephone Encounter (Signed)
Refilled norco.   Rosemarie Ax, MD [facility] 05/16/2021, 3:43 PM

## 2021-05-16 NOTE — Telephone Encounter (Signed)
Pt's wife called states he is out of town but ask for Rx refills of :   HYDROcodone-acetaminophen (NORCO/VICODIN) 5-325 MG tablet [[id number]]    Order Details Dose: 1 tablet Route: Oral Frequency: Every 8 hours PRN  Dispense Quantity: 15 tablet Refills: 0        Sig: Take 1 tablet by mouth every 8 (eight) hours as needed.         If approved send refill order to:   Pharmacy  [id number] - [place], [address]  [place], [place] [address]  Phone:  [phone]  Fax:  [phone]     --glh

## 2021-05-31 NOTE — Progress Notes (Deleted)
[place] [facility] Clinic Note - Initial Visit   Date: 05/31/21  Glenn Lyons MRN: [id number] DOB: 1956-04-07   Dear Dr Marland KitchenDamita Dunnings, Elveria Rising, MD:  Thank you for your kind referral of Glenn Lyons for consultation of ***. Although his history is well known to you, please allow [place] to reiterate it for the purpose of our medical record. The patient was accompanied to the clinic by *** who also provides collateral information.     History of Present Illness: Glenn Lyons is a 66 y.o. ***-handed male with diabetes mellitus, depression, sinus bradycardia s/p PPM, and esophageal stricture presenting for evaluation of double vision ***.    Out-side paper records, electronic medical record, and images have been reviewed where available and summarized as: *** Lab Results  Component Value Date   HGBA1C 5.9 (H) 04/18/2021   No results found for: [id number] Lab Results  Component Value Date   TSH 1.61 01/03/2021   No results found for: ESRSEDRATE, POCTSEDRATE  Past Medical History:  Diagnosis Date   Achalasia    with prev eval at [facility].  No intervention as of 2012   Arthritis    Backache, unspecified    Cardiac pacemaker St. Jude    ERI 2008   Depressive disorder, not elsewhere classified    Diabetes ([place])    Morbid obesity ([place])    Obstructive sleep apnea    biapap settign 25-22   Presence of permanent cardiac pacemaker    St. Jude- Dr. Caryl Comes follows -device Check 07-06-14   Sinus bradycardia /pauses    Stricture and stenosis of esophagus     Past Surgical History:  Procedure Laterality Date   BALLOON DILATION N/A 10/06/2015   Procedure: BALLOON DILATION;  Surgeon: Mauri Pole, MD;  Location: [place] [facility];  Service: Endoscopy;  Laterality: N/A;   BIOPSY  05/06/2018   Procedure: BIOPSY;  Surgeon: Rush Landmark Telford Nab., MD;  Location: [place];  Service: Gastroenterology;;   COLONOSCOPY W/ POLYPECTOMY  11/01/2010   COLONOSCOPY WITH PROPOFOL N/A  10/05/2014   Procedure: COLONOSCOPY WITH PROPOFOL;  Surgeon: Gatha Mayer, MD;  [facility];  Service: Endoscopy;  Laterality: N/A;   COLONOSCOPY WITH PROPOFOL N/A 05/06/2018   Procedure: COLONOSCOPY WITH PROPOFOL;  Surgeon: Rush Landmark Telford Nab., MD;  Location: [place];  Service: Gastroenterology;  Laterality: N/A;   ESOPHAGEAL DILATION     ESOPHAGEAL MANOMETRY N/A 08/23/2015   Procedure: ESOPHAGEAL MANOMETRY (EM);  Surgeon: Mauri Pole, MD;  [facility];  Service: Endoscopy;  Laterality: N/A;   ESOPHAGOGASTRODUODENOSCOPY N/A 10/09/2014   Procedure: ESOPHAGOGASTRODUODENOSCOPY (EGD);  Surgeon: Gatha Mayer, MD;  Location[facility];  Service: Endoscopy;  Laterality: N/A;   ESOPHAGOGASTRODUODENOSCOPY (EGD) WITH PROPOFOL N/A 08/23/2015   Procedure: ESOPHAGOGASTRODUODENOSCOPY (EGD) WITH PROPOFOL;  Surgeon: Mauri Pole, MD;  [facility];  Service: Endoscopy;  Laterality: N/A;   ESOPHAGOGASTRODUODENOSCOPY (EGD) WITH PROPOFOL N/A 10/06/2015   Procedure: ESOPHAGOGASTRODUODENOSCOPY (EGD) WITH PROPOFOL;  Surgeon: Mauri Pole, MD;  Location: [place] [facility];  Service: Endoscopy;  Laterality: N/A;  Rigiflex ballon size 6m-35mm size 45 minute proc,need Fluro Gastografin esophagram 2 hrs post EGD    GASTRIC ROUX-EN-Y N/A 04/05/2020   Procedure: LAPAROSCOPIC ROUX-EN-Y GASTRIC BYPASS WITH UPPER ENDOSCOPY;  Surgeon: WGreer Pickerel MD;  [facility];  Service: General;  Laterality: N/A;   PACEMAKER INSERTION     POLYPECTOMY  05/06/2018   Procedure: POLYPECTOMY;  Surgeon: MIrving Copas, MD;  [place]  Service:  Gastroenterology;;   [facility] GENERATOR CHANGEOUT N/A 05/01/2018   Procedure: PPM GENERATOR CHANGEOUT;  Surgeon: Deboraha Sprang, MD;  Location: [place] [facility];  Service: Cardiovascular;  Laterality: N/A;     Medications:  Outpatient Encounter Medications as of 06/01/2021  Medication Sig   Blood Glucose Monitoring Suppl  (ONETOUCH VERIO) w/Device KIT Inject 1 kit into the skin every morning. Use to test blood sugar each day before breakfast and 2 hours after a meal for 2 weeks.  Diagnosis:  E11.9  Non-insulin dependent.   Calcium Carbonate (CALCIUM 500 PO) Take 1,500 mg by mouth daily.   Camphor-Menthol-Methyl Sal (SALONPAS) 3.06-03-08 % PTCH Apply 1 patch topically daily as needed (pain).   HYDROcodone-acetaminophen (NORCO/VICODIN) 5-325 MG tablet Take 1 tablet by mouth every 8 (eight) hours as needed.   Lancets (ONETOUCH ULTRASOFT) lancets USE TO TEST BLOOD SUGAR ONCE DAILY OR AS NEEDED   Multiple Vitamins-Minerals (BARIATRIC MULTIVITAMINS/IRON PO) Take 2 tablets by mouth daily.   ONETOUCH VERIO test strip USE TO TEST BLOOD SUGAR ONCE DAILY OR AS INSTRUCTED   [DISCONTINUED] enoxaparin (LOVENOX) 60 MG/0.6ML injection Inject 0.6 mLs (60 mg total) into the skin 2 (two) times daily for 28 days.   [DISCONTINUED] gabapentin (NEURONTIN) 100 MG capsule Take 2 capsules (200 mg total) by mouth every 12 (twelve) hours.   [DISCONTINUED] pantoprazole (PROTONIX) 40 MG tablet Take 1 tablet (40 mg total) by mouth daily. (Patient not taking: No sig reported)   No facility-administered encounter medications on file as of 06/01/2021.    Allergies:  Allergies  Allergen Reactions   Morphine Other (See Comments)    Cardiac Arrest   Metformin And Related     Intolerant of any dose due to aches    Family History: Family History  Problem Relation Age of Onset   Heart attack Father    Heart disease Father    Cancer Father        multiple myeloma   Hypertension Father    Prostate cancer Father        possible dx   Hypertension Mother    Dementia Mother    Colon cancer Neg Hx     Social History: Social History   Tobacco Use   Smoking status: Former    Packs/day: 1.00    Years: 20.00    Pack years: 20.00    Types: Cigarettes    Quit date: 05/29/2004    Years since quitting: 17.0   Smokeless tobacco: Never  Vaping  Use   Vaping Use: Never used  Substance Use Topics   Alcohol use: Never   Drug use: No   Social History   Social History Narrative   From Air cabin crew   Married    Vital Signs:  There were no vitals taken for this visit.   General Medical Exam:  *** General:  Well appearing, comfortable.   Eyes/ENT: see cranial nerve examination.   Neck:   No carotid bruits. Respiratory:  Clear to auscultation, good air entry bilaterally.   Cardiac:  Regular rate and rhythm, no murmur.   Extremities:  No deformities, edema, or skin discoloration.  Skin:  No rashes or lesions.  Neurological Exam: MENTAL STATUS including orientation to time, place, person, recent and remote memory, attention span and concentration, language, and fund of knowledge is ***normal.  Speech is not dysarthric.  CRANIAL NERVES: II:  No visual field defects.  Unremarkable fundi.   III-IV-VI: Pupils equal round and reactive to light.  Normal conjugate, extra-ocular eye movements in all directions of gaze.  No nystagmus.  No ptosis***.   V:  Normal facial sensation.    VII:  Normal facial symmetry and movements.   VIII:  Normal hearing and vestibular function.   IX-X:  Normal palatal movement.   XI:  Normal shoulder shrug and head rotation.   XII:  Normal tongue strength and range of motion, no deviation or fasciculation.  MOTOR:  No atrophy, fasciculations or abnormal movements.  No pronator drift.   Upper Extremity:  Right  Left  Deltoid  5/5   5/5   Biceps  5/5   5/5   Triceps  5/5   5/5   Infraspinatus 5/5  5/5  Medial pectoralis 5/5  5/5  Wrist extensors  5/5   5/5   Wrist flexors  5/5   5/5   Finger extensors  5/5   5/5   Finger flexors  5/5   5/5   Dorsal interossei  5/5   5/5   Abductor pollicis  5/5   5/5   Tone (Ashworth scale)  0  0   Lower Extremity:  Right  Left  Hip flexors  5/5   5/5   Hip extensors  5/5   5/5   Adductor 5/5  5/5  Abductor 5/5  5/5  Knee flexors  5/5   5/5    Knee extensors  5/5   5/5   Dorsiflexors  5/5   5/5   Plantarflexors  5/5   5/5   Toe extensors  5/5   5/5   Toe flexors  5/5   5/5   Tone (Ashworth scale)  0  0   MSRs:  Right        Left                  brachioradialis 2+  2+  biceps 2+  2+  triceps 2+  2+  patellar 2+  2+  ankle jerk 2+  2+  Hoffman no  no  plantar response down  down   SENSORY:  Normal and symmetric perception of light touch, pinprick, vibration, and proprioception.  Romberg's sign absent.   COORDINATION/GAIT: Normal finger-to- nose-finger and heel-to-shin.  Intact rapid alternating movements bilaterally.  Able to rise from a chair without using arms.  Gait narrow based and stable. Tandem and stressed gait intact.    IMPRESSION: ***  PLAN/RECOMMENDATIONS:  *** Return to clinic in *** months.  Total time spent: ***   Thank you for allowing me to participate in patient's care.  If I can answer any additional questions, I would be pleased to do so.    Sincerely,    Darrelyn Morro K. Posey Pronto, DO

## 2021-06-01 ENCOUNTER — Ambulatory Visit: Payer: BC Managed Care – PPO | Admitting: Neurology

## 2021-06-01 ENCOUNTER — Telehealth: Payer: Self-pay | Admitting: *Deleted

## 2021-06-01 NOTE — Telephone Encounter (Signed)
° °  Pre-operative Risk Assessment    Patient Name: Glenn Lyons  DOB: 06-Oct-1955 MRN: [id number]      Request for Surgical Clearance    Procedure:   LEFT TOTAL KNEE ARTHROPLASTY  Date of Surgery:  Clearance 08/15/21                                 Surgeon:  DR. Gaynelle Arabian Surgeon's Group or Practice Name:  [facility] Phone number:  [phone] Fax number:  [phone] ATTN: [place]   Type of Clearance Requested:   - Medical    Type of Anesthesia:   CHOICE   Additional requests/questions:    Jiles Prows   06/01/2021, 12:08 PM

## 2021-06-01 NOTE — Telephone Encounter (Signed)
Patient was cleared for the same procedure in November.  It appears procedure was canceled and moved to March instead.  I left the patient a message for him to call back and speak to the on-call preop APP of the day, as long as he is not having new cardiac issues, he should be cleared as well.

## 2021-06-09 NOTE — Telephone Encounter (Signed)
° °  Name: LAYTH CEREZO  DOB: January 09, 1956  MRN: [id number]   Primary Cardiologist: Virl Axe, MD  Chart reviewed as part of pre-operative protocol coverage. Patient was contacted 06/09/2021 in reference to pre-operative risk assessment for pending surgery as outlined below.  Matis Monnier [place] was last seen on 04/13/2021 by Estella Husk PA-C.  Since that day, ROBERTS BON has done well without exertional chest pain or worsening dyspnea.  According to the Revised Cardiac Risk Index (RCRI), his Perioperative Risk of Major Cardiac Event is (%): 0.4  Per previous device clinic recommendation: Have magnet available. Provide continuous ECG monitoring when magnet is used or reprogramming is to be performed.  Procedure should not interfere with device function.  No device programming or magnet placement needed.  Therefore, based on ACC/AHA guidelines, the patient would be at acceptable risk for the planned procedure without further cardiovascular testing.   The patient was advised that if he develops new symptoms prior to surgery to contact our office to arrange for a follow-up visit, and he verbalized understanding.  I will route this recommendation to the requesting party via Epic fax function and remove from pre-op pool. Please call with questions.  [place], [place] 06/09/2021, 9:11 AM

## 2021-06-23 ENCOUNTER — Telehealth: Payer: Self-pay | Admitting: Family Medicine

## 2021-06-23 NOTE — Telephone Encounter (Signed)
Pt cld on his way to [place] and says now Rt knee (which is being replaced in 2 months is now in severe pain wonders if he can get a Zilretta injection for it too on same day the Left knee .  -Forwarding request to provider for review.  --glh

## 2021-06-27 ENCOUNTER — Ambulatory Visit: Payer: Self-pay

## 2021-06-27 ENCOUNTER — Ambulatory Visit: Payer: BC Managed Care – PPO | Admitting: Family Medicine

## 2021-06-27 VITALS — BP 130/70 | Ht 72.0 in | Wt 302.0 lb

## 2021-06-27 DIAGNOSIS — M170 Bilateral primary osteoarthritis of knee: Secondary | ICD-10-CM

## 2021-06-27 NOTE — Progress Notes (Signed)
Glenn Lyons - 66 y.o. male MRN [id number]  Date of birth: Apr 05, 1956  SUBJECTIVE:  Including CC & ROS.  No chief complaint on file.   Glenn Lyons is a 66 y.o. male that is here for Zilretta injection.    Review of Systems See HPI   HISTORY: Past Medical, Surgical, Social, and Family History Reviewed & Updated per EMR.   Pertinent Historical Findings include:  Past Medical History:  Diagnosis Date   Achalasia    with prev eval at [facility].  No intervention as of 2012   Arthritis    Backache, unspecified    Cardiac pacemaker St. Jude    ERI 2008   Depressive disorder, not elsewhere classified    Diabetes ([place])    Morbid obesity ([place])    Obstructive sleep apnea    biapap settign 25-22   Presence of permanent cardiac pacemaker    St. Jude- Dr. Caryl Comes follows -device Check 07-06-14   Sinus bradycardia /pauses    Stricture and stenosis of esophagus     Past Surgical History:  Procedure Laterality Date   BALLOON DILATION N/A 10/06/2015   Procedure: BALLOON DILATION;  Surgeon: Mauri Pole, MD;  Location: [facility];  Service: Endoscopy;  Laterality: N/A;   BIOPSY  05/06/2018   Procedure: BIOPSY;  Surgeon: Rush Landmark Telford Nab., MD;  Location: [place];  Service: Gastroenterology;;   COLONOSCOPY W/ POLYPECTOMY  11/01/2010   COLONOSCOPY WITH PROPOFOL N/A 10/05/2014   Procedure: COLONOSCOPY WITH PROPOFOL;  Surgeon: Gatha Mayer, MD;  Location: [facility];  Service: Endoscopy;  Laterality: N/A;   COLONOSCOPY WITH PROPOFOL N/A 05/06/2018   Procedure: COLONOSCOPY WITH PROPOFOL;  Surgeon: Rush Landmark Telford Nab., MD;  Location: [place];  Service: Gastroenterology;  Laterality: N/A;   ESOPHAGEAL DILATION     ESOPHAGEAL MANOMETRY N/A 08/23/2015   Procedure: ESOPHAGEAL MANOMETRY (EM);  Surgeon: Mauri Pole, MD;  Location: [facility];  Service: Endoscopy;  Laterality: N/A;   ESOPHAGOGASTRODUODENOSCOPY N/A 10/09/2014   Procedure: ESOPHAGOGASTRODUODENOSCOPY (EGD);   Surgeon: Gatha Mayer, MD;  Location: [facility];  Service: Endoscopy;  Laterality: N/A;   ESOPHAGOGASTRODUODENOSCOPY (EGD) WITH PROPOFOL N/A 08/23/2015   Procedure: ESOPHAGOGASTRODUODENOSCOPY (EGD) WITH PROPOFOL;  Surgeon: Mauri Pole, MD;  Location: [facility];  Service: Endoscopy;  Laterality: N/A;   ESOPHAGOGASTRODUODENOSCOPY (EGD) WITH PROPOFOL N/A 10/06/2015   Procedure: ESOPHAGOGASTRODUODENOSCOPY (EGD) WITH PROPOFOL;  Surgeon: Mauri Pole, MD;  Location: [facility];  Service: Endoscopy;  Laterality: N/A;  Rigiflex ballon size 64mm-35mm size 45 minute proc,need Fluro Gastografin esophagram 2 hrs post EGD    GASTRIC ROUX-EN-Y N/A 04/05/2020   Procedure: LAPAROSCOPIC ROUX-EN-Y GASTRIC BYPASS WITH UPPER ENDOSCOPY;  Surgeon: Greer Pickerel, MD;  Location: [facility];  Service: General;  Laterality: N/A;   PACEMAKER INSERTION     POLYPECTOMY  05/06/2018   Procedure: POLYPECTOMY;  Surgeon: Irving Copas., MD;  Location: [place];  Service: Gastroenterology;;   Betsy Pries GENERATOR CHANGEOUT N/A 05/01/2018   Procedure: PPM GENERATOR CHANGEOUT;  Surgeon: Deboraha Sprang, MD;  Location: [facility] [facility];  Service: Cardiovascular;  Laterality: N/A;     PHYSICAL EXAM:  VS: BP 130/70    Ht 6' (1.829 m)    Wt (!) 302 lb (137 kg)    BMI 40.96 kg/m  Physical Exam Gen: NAD, alert, cooperative with exam, well-appearing MSK:  Neurovascularly intact     Aspiration/Injection Procedure Note Glenn Lyons 09-Dec-1955  Procedure: Aspiration and Injection Indications: left knee pain  Procedure Details Consent: Risks of procedure as well as the alternatives and risks of each were explained to the (patient/caregiver).  Consent for procedure obtained. Time Out: Verified patient identification, verified procedure, site/side was marked, verified correct patient position, special equipment/implants available, medications/allergies/relevent history reviewed, required imaging and test  results available.  Performed.  The area was cleaned with iodine and alcohol swabs.    The left knee superior lateral suprapatellar pouch was injected using 3 cc of 1% lidocaine on a 25-gauge 1-1/2 inch needle.  An 18-gauge 1-1/2 needle was used to achieve aspiration.  The syringe was switched and a mixture containing 5 cc's of 32 mg Zilretta and 4 cc's of 0.25% bupivacaine was injected.  Ultrasound was used. Images were obtained in long views showing the injection.   Amount of Fluid Aspirated: 55mL Character of Fluid: clear and straw colored Fluid was sent for: n/a  A sterile dressing was applied.  Patient did tolerate procedure well.     Aspiration/Injection Procedure Note Glenn Lyons [birth year]  Procedure: Injection Indications: right knee pain   Procedure Details Consent: Risks of procedure as well as the alternatives and risks of each were explained to the (patient/caregiver).  Consent for procedure obtained. Time Out: Verified patient identification, verified procedure, site/side was marked, verified correct patient position, special equipment/implants available, medications/allergies/relevent history reviewed, required imaging and test results available.  Performed.  The area was cleaned with iodine and alcohol swabs.    The right knee superior lateral suprapatellar pouch was injected using 3 cc of 1% lidocaine on a 25-gauge 1-1/2 inch needle.  The syringe was switched and a mixture containing 5 cc's of 32 mg Zilretta and 4 cc's of 0.25% bupivacaine was injected.  Ultrasound was used. Images were obtained in long views showing the injection.    A sterile dressing was applied.  Patient did tolerate procedure well.       ASSESSMENT & PLAN:   OA (osteoarthritis) of knee Completed zilretta injections today.

## 2021-06-27 NOTE — Assessment & Plan Note (Signed)
Completed zilretta injections today.

## 2021-06-27 NOTE — Patient Instructions (Signed)
Good to see you Please use ice as needed  Please send me a message in MyChart with any questions or updates.  Please see me back as needed.   --Dr. Cuong Moorman  

## 2021-07-18 ENCOUNTER — Ambulatory Visit (INDEPENDENT_AMBULATORY_CARE_PROVIDER_SITE_OTHER): Payer: BC Managed Care – PPO

## 2021-07-18 DIAGNOSIS — Z950 Presence of cardiac pacemaker: Secondary | ICD-10-CM

## 2021-07-18 LAB — CUP PACEART REMOTE DEVICE CHECK
Battery Remaining Longevity: 94 mo
Battery Remaining Percentage: 76 %
Battery Voltage: 2.99 V
Brady Statistic AP VP Percent: 1 %
Brady Statistic AP VS Percent: 59 %
Brady Statistic AS VP Percent: 1 %
Brady Statistic AS VS Percent: 41 %
Brady Statistic RA Percent Paced: 58 %
Brady Statistic RV Percent Paced: 1 %
Date Time Interrogation Session: 20230220020012
Implantable Lead Implant Date: 20061219
Implantable Lead Implant Date: 20061219
Implantable Lead Location: 753859
Implantable Lead Location: 753860
Implantable Pulse Generator Implant Date: 20191204
Lead Channel Impedance Value: 1225 Ohm
Lead Channel Impedance Value: 460 Ohm
Lead Channel Pacing Threshold Amplitude: 1.125 V
Lead Channel Pacing Threshold Amplitude: 1.5 V
Lead Channel Pacing Threshold Pulse Width: 0.5 ms
Lead Channel Pacing Threshold Pulse Width: 0.8 ms
Lead Channel Sensing Intrinsic Amplitude: 5 mV
Lead Channel Sensing Intrinsic Amplitude: 8.1 mV
Lead Channel Setting Pacing Amplitude: 1.375
Lead Channel Setting Pacing Amplitude: 2.5 V
Lead Channel Setting Pacing Pulse Width: 0.5 ms
Lead Channel Setting Sensing Sensitivity: 2.5 mV
Pulse Gen Model: 2272
Pulse Gen Serial Number: 9090970

## 2021-07-22 NOTE — Progress Notes (Signed)
Remote pacemaker transmission.   

## 2021-07-29 ENCOUNTER — Ambulatory Visit: Payer: BC Managed Care – PPO | Admitting: Neurology

## 2021-07-29 ENCOUNTER — Encounter: Payer: Self-pay | Admitting: Neurology

## 2021-07-29 DIAGNOSIS — Z029 Encounter for administrative examinations, unspecified: Secondary | ICD-10-CM

## 2021-08-02 ENCOUNTER — Telehealth: Payer: Self-pay | Admitting: Internal Medicine

## 2021-08-02 NOTE — Telephone Encounter (Signed)
Inbound call from patient's wife requesting to schedule colonoscopy at the hospital for patient.  Please advise. ?

## 2021-08-03 NOTE — Telephone Encounter (Signed)
Pt stated that he is ready to schedule Colonoscopy at the [facility] notified that there is very limited availability for procedures at the hospital. Pt stated that he understands that it can take some time: ?Pt was notified that Dr. Carlean Purl will be made aware of his request: ?Pt verbalized understanding with all questions answered.  ? ?

## 2021-08-08 NOTE — Telephone Encounter (Signed)
Mr. Glenn Lyons is needs an office visit with me before we do anything.  It looks like he is having knee surgery this month so we should schedule that for sometime after the knee surgery ?

## 2021-08-09 ENCOUNTER — Other Ambulatory Visit: Payer: Self-pay

## 2021-08-09 NOTE — Telephone Encounter (Signed)
Pt made aware of Dr. Carlean Purl recommendations: ?Pt scheduled for 08/31/2021 at 10:50 AM to see Dr. Carlean Purl   Pt made aware ?Pt verbalized understanding with all questions answered.  ?

## 2021-08-10 NOTE — Patient Instructions (Addendum)
DUE TO COVID-19 ONLY ONE VISITOR  (aged 65 and older)  IS ALLOWED TO COME WITH YOU AND STAY IN THE WAITING ROOM ONLY DURING PRE OP AND PROCEDURE.   ?**NO VISITORS ARE ALLOWED IN THE SHORT STAY AREA OR RECOVERY ROOM!!** ? ?IF YOU WILL BE ADMITTED INTO THE HOSPITAL YOU ARE ALLOWED ONLY TWO SUPPORT PEOPLE DURING VISITATION HOURS ONLY (7 AM -8PM)   ?The support person(s) must pass our screening, gel in and out, and wear a mask at all times, including in the patient?s room. ?Patients must also wear a mask when staff or their support person are in the room. ?Visitors GUEST BADGE MUST BE WORN VISIBLY  ?One adult visitor may remain with you overnight and MUST be in the room by 8 P.M.  ? ?You are not required to quarantine, however you are required to wear a well-fitted mask when you are out and around people not in your household.  ?Hand Hygiene often ?Do NOT share personal items ?Notify your provider if you are in close contact with someone who has COVID or you develop fever 100.4 or greater, new onset of sneezing, cough, sore throat, shortness of breath or body aches. ? ?     ? Your procedure is scheduled on: Monday, 08-15-21 ? ? Report to [facility] Main Entrance ? ?  Report to admitting at 6:40 AM ? ? Call this number if you have problems the morning of surgery [phone] ? ? Do not eat food :After Midnight. ? ? After Midnight you may have the following liquids until 6:20 AM DAY OF SURGERY ? ?Water ?Black Coffee (sugar ok, NO MILK/CREAM OR CREAMERS)  ?Tea (sugar ok, NO MILK/CREAM OR CREAMERS) regular and decaf                             ?Plain Jell-O (NO RED)                                           ?Fruit ices (not with fruit pulp, NO RED)                                     ?Popsicles (NO RED)                                                                  ?Juice: apple, WHITE grape, WHITE cranberry ?Sports drinks like Gatorade (NO RED) ?Clear broth(vegetable,chicken,beef) ? ?             ?Drink 1 G2  drink AT 6:20 AM the morning of surgery. ? ?  ?  ?The day of surgery:  ?Drink ONE (1) Pre-Surger G2 at 6:20 AM the morning of surgery. Drink in one sitting. Do not sip.  ?This drink was given to you during your hospital  ?pre-op appointment visit. ?Nothing else to drink after completing the Pre-Surgery  G2. ?  ?       If you have questions, please contact your surgeon?s office. ? ? ?FOLLOW ANY ADDITIONAL PRE OP INSTRUCTIONS  YOU RECEIVED FROM YOUR SURGEON'S OFFICE!!! ?  ?  ?Oral Hygiene is also important to reduce your risk of infection.                                    ?Remember - BRUSH YOUR TEETH THE MORNING OF SURGERY WITH YOUR REGULAR TOOTHPASTE ? ? Do NOT smoke after Midnight ? ? Take these medicines the morning of surgery with A SIP OF WATER: Hydrocodone if needed ? ?DO NOT TAKE ANY ORAL DIABETIC MEDICATIONS DAY OF YOUR SURGERY ? ?Bring CPAP mask and tubing day of surgery. ?                  ?           You may not have any metal on your body including jewelry, and body piercing ? ?           Do not wear  lotions, powders, cologne, or deodorant ? ?            Men may shave face and neck. ? ? Do not bring valuables to the hospital. [place]. ? ? Contacts, dentures or bridgework may not be worn into surgery. ? ? Bring small overnight bag day of surgery. ?  ?Special Instructions: Bring a copy of your healthcare power of attorney and living will documents  the day of surgery if you haven't scanned them before. ? ?Please read over the following fact sheets you were given: IF [place]  ? ?[place] - Preparing for Surgery ?Before surgery, you can play an important role.  Because skin is not sterile, your skin needs to be as free of germs as possible.  You can reduce the number of germs on your skin by washing with CHG (chlorahexidine gluconate) soap before surgery.  CHG is an antiseptic cleaner which kills  germs and bonds with the skin to continue killing germs even after washing. ?Please DO NOT use if you have an allergy to CHG or antibacterial soaps.  If your skin becomes reddened/irritated stop using the CHG and inform your nurse when you arrive at Short Stay. ?Do not shave (including legs and underarms) for at least 48 hours prior to the first CHG shower.  You may shave your face/neck. ? ?Please follow these instructions carefully: ? 1.  Shower with CHG Soap the night before surgery and the  morning of surgery. ? 2.  If you choose to wash your hair, wash your hair first as usual with your normal  shampoo. ? 3.  After you shampoo, rinse your hair and body thoroughly to remove the shampoo.                            ? 4.  Use CHG as you would any other liquid soap.  You can apply chg directly to the skin and wash.  Gently with a scrungie or clean washcloth. ? 5.  Apply the CHG Soap to your body ONLY FROM THE NECK DOWN.   Do   not use on face/ open      ?                     Wound or open sores. Avoid contact with eyes, ears mouth and  genitals (private parts).  ?                     Production manager,  Genitals (private parts) with your normal soap. ?            6.  Wash thoroughly, paying special attention to the area where your    surgery  will be performed. ? 7.  Thoroughly rinse your body with warm water from the neck down. ? 8.  DO NOT shower/wash with your normal soap after using and rinsing off the CHG Soap. ?               9.  Pat yourself dry with a clean towel. ?           10.  Wear clean pajamas. ?           11.  Place clean sheets on your bed the night of your first shower and do not  sleep with pets. ?Day of Surgery : ?Do not apply any lotions/deodorants the morning of surgery.  Please wear clean clothes to the hospital/surgery center. ? ?FAILURE TO FOLLOW THESE INSTRUCTIONS MAY RESULT IN THE CANCELLATION OF YOUR SURGERY ? ?PATIENT SIGNATURE_________________________________ ? ?NURSE  SIGNATURE__________________________________ ? ?________________________________________________________________________  ?  ? ?Incentive Spirometer ? ?An incentive spirometer is a tool that can help keep your lungs clear and active. This tool measures how well you are filling your lungs with each breath. Taking long deep breaths may help reverse or decrease the chance of developing breathing (pulmonary) problems (especially infection) following: ?A long period of time when you are unable to move or be active. ?BEFORE THE PROCEDURE  ?If the spirometer includes an indicator to show your best effort, your nurse or respiratory therapist will set it to a desired goal. ?If possible, sit up straight or lean slightly forward. Try not to slouch. ?Hold the incentive spirometer in an upright position. ?INSTRUCTIONS FOR USE  ?Sit on the edge of your bed if possible, or sit up as far as you can in bed or on a chair. ?Hold the incentive spirometer in an upright position. ?Breathe out normally. ?Place the mouthpiece in your mouth and seal your lips tightly around it. ?Breathe in slowly and as deeply as possible, raising the piston or the ball toward the top of the column. ?Hold your breath for 3-5 seconds or for as long as possible. Allow the piston or ball to fall to the bottom of the column. ?Remove the mouthpiece from your mouth and breathe out normally. ?Rest for a few seconds and repeat Steps 1 through 7 at least 10 times every 1-2 hours when you are awake. Take your time and take a few normal breaths between deep breaths. ?The spirometer may include an indicator to show your best effort. Use the indicator as a goal to work toward during each repetition. ?After each set of 10 deep breaths, practice coughing to be sure your lungs are clear. If you have an incision (the cut made at the time of surgery), support your incision when coughing by placing a pillow or rolled up towels firmly against it. ?Once you are able to get out  of bed, walk around indoors and cough well. You may stop using the incentive spirometer when instructed by your caregiver.  ?RISKS AND COMPLICATIONS ?Take your time so you do not get dizzy or light-headed. ?If you are in pain, you

## 2021-08-10 NOTE — Progress Notes (Addendum)
COVID swab appointment:  N/A ? ?COVID Vaccine Completed:  Yes x2 ?Date COVID Vaccine completed:  08-09-19 08-30-19 ?Has received booster:  Yes x2 03-14-20 10-04-20 ?COVID vaccine manufacturer: Pfizer     ? ?Date of COVID positive in last 90 days:  No ? ?PCP - Elsie Stain, MD ?Cardiologist - Virl Axe, MD ? ?Cardiac clearance in Epic dated 06-09-21 by Almyra Deforest, PA ? ?Chest x-ray - 11-19-20 Epic ?EKG - 04-13-21 Epic ?Stress Test - 11-13-19 Epic ?ECHO - greater than 2 years Epic ?Cardiac Cath - N/A ?Pacemaker/ICD device last checked: 07-18-21 ?Spinal Cord Stimulator: ? ?Bowel Prep - N/A ? ?Sleep Study -  Yes, +sleep apnea ?CPAP - Yes ? ?Fasting Blood Sugar - Less than 110 ?Checks Blood Sugar - 1 time a day ? ?Blood Thinner Instructions:  N/A ?Aspirin Instructions: ?Last Dose: ? ?Activity level:   Can go up a flight of stairs and perform activities of daily living without stopping and without symptoms of chest pain or shortness of breath. ?  ?Anesthesia review:  Sinus bradycardia, OSA, DM, sinus node dysfunction, pacemaker.  Achalasia with history of aspiration pneumonia after colonoscopy (swallowing has improved after dilation) ? ?Patient denies shortness of breath, fever, cough and chest pain at PAT appointment ? ? ?Patient verbalized understanding of instructions that were given to them at the PAT appointment. Patient was also instructed that they will need to review over the PAT instructions again at home before surgery.  ?

## 2021-08-12 ENCOUNTER — Encounter (HOSPITAL_COMMUNITY)
Admission: RE | Admit: 2021-08-12 | Discharge: 2021-08-12 | Disposition: A | Discharge status: Home / Self Care | Payer: BC Managed Care – PPO | Source: Ambulatory Visit | Attending: Orthopedic Surgery | Admitting: Orthopedic Surgery

## 2021-08-12 ENCOUNTER — Encounter (HOSPITAL_COMMUNITY): Payer: Self-pay

## 2021-08-12 ENCOUNTER — Other Ambulatory Visit: Payer: Self-pay

## 2021-08-12 VITALS — BP 121/81 | HR 62 | Temp 98.0°F | Resp 18 | Ht 72.0 in | Wt 301.0 lb

## 2021-08-12 DIAGNOSIS — Z87891 Personal history of nicotine dependence: Secondary | ICD-10-CM | POA: Insufficient documentation

## 2021-08-12 DIAGNOSIS — E119 Type 2 diabetes mellitus without complications: Secondary | ICD-10-CM | POA: Insufficient documentation

## 2021-08-12 DIAGNOSIS — G4733 Obstructive sleep apnea (adult) (pediatric): Secondary | ICD-10-CM | POA: Insufficient documentation

## 2021-08-12 DIAGNOSIS — M1712 Unilateral primary osteoarthritis, left knee: Secondary | ICD-10-CM | POA: Diagnosis not present

## 2021-08-12 DIAGNOSIS — Z01818 Encounter for other preprocedural examination: Secondary | ICD-10-CM

## 2021-08-12 DIAGNOSIS — Z950 Presence of cardiac pacemaker: Secondary | ICD-10-CM | POA: Diagnosis not present

## 2021-08-12 DIAGNOSIS — Z01812 Encounter for preprocedural laboratory examination: Secondary | ICD-10-CM | POA: Diagnosis present

## 2021-08-12 DIAGNOSIS — Z6841 Body Mass Index (BMI) 40.0 and over, adult: Secondary | ICD-10-CM | POA: Insufficient documentation

## 2021-08-12 LAB — CBC
HCT: 47.3 % (ref 39.0–52.0)
Hemoglobin: 16.1 g/dL (ref 13.0–17.0)
MCH: 31.1 pg (ref 26.0–34.0)
MCHC: 34 g/dL (ref 30.0–36.0)
MCV: 91.3 fL (ref 80.0–100.0)
Platelets: 244 10*3/uL (ref 150–400)
RBC: 5.18 MIL/uL (ref 4.22–5.81)
RDW: 13.6 % (ref 11.5–15.5)
WBC: 9 10*3/uL (ref 4.0–10.5)
nRBC: 0 % (ref 0.0–0.2)

## 2021-08-12 LAB — COMPREHENSIVE METABOLIC PANEL
ALT: 28 U/L (ref 0–44)
AST: 26 U/L (ref 15–41)
Albumin: 3.9 g/dL (ref 3.5–5.0)
Alkaline Phosphatase: 74 U/L (ref 38–126)
Anion gap: 7 (ref 5–15)
BUN: 14 mg/dL (ref 8–23)
CO2: 24 mmol/L (ref 22–32)
Calcium: 9.4 mg/dL (ref 8.9–10.3)
Chloride: 103 mmol/L (ref 98–111)
Creatinine, Ser: 0.77 mg/dL (ref 0.61–1.24)
GFR, Estimated: >60 mL/min (ref >60)
Glucose, Bld: 155 mg/dL — ABNORMAL HIGH (ref 70–99)
Potassium: 4.1 mmol/L (ref 3.5–5.1)
Sodium: 134 mmol/L — ABNORMAL LOW (ref 135–145)
Total Bilirubin: 0.7 mg/dL (ref 0.3–1.2)
Total Protein: 7.1 g/dL (ref 6.5–8.1)

## 2021-08-12 LAB — HEMOGLOBIN A1C
Hgb A1c MFr Bld: 6.4 % — ABNORMAL HIGH (ref 4.8–5.6)
Mean Plasma Glucose: 136.98 mg/dL

## 2021-08-12 LAB — GLUCOSE, CAPILLARY: Glucose-Capillary: 193 mg/dL — ABNORMAL HIGH (ref 70–99)

## 2021-08-12 LAB — PROTIME-INR
INR: 1.1 (ref 0.8–1.2)
Prothrombin Time: 13.7 seconds (ref 11.4–15.2)

## 2021-08-12 LAB — SURGICAL PCR SCREEN
MRSA, PCR: NEGATIVE
Staphylococcus aureus: NEGATIVE

## 2021-08-12 NOTE — Anesthesia Preprocedure Evaluation (Addendum)
Anesthesia Evaluation  ?Patient identified by MRN, date of birth, ID band ?Patient awake ? ? ? ?Reviewed: ?Allergy & Precautions, H&P , NPO status , Patient's Chart, lab work & pertinent test results ? ?Airway ?Mallampati: III ? ?TM Distance: >3 FB ?Neck ROM: Full ? ? ? Dental ?no notable dental hx. ?(+) Teeth Intact, Dental Advisory Given ?  ?Pulmonary ?sleep apnea and Continuous Positive Airway Pressure Ventilation , former smoker,  ?  ?Pulmonary exam normal ?breath sounds clear to auscultation ? ? ? ? ? ? Cardiovascular ?hypertension, negative cardio ROS ? ?+ pacemaker  ?Rhythm:Regular Rate:Normal ? ? ?  ?Neuro/Psych ?Anxiety Depression negative neurological ROS ?   ? GI/Hepatic ?Neg liver ROS, GERD  ,  ?Endo/Other  ?negative endocrine ROSdiabetesMorbid obesity ? Renal/GU ?negative Renal ROS  ?negative genitourinary ?  ?Musculoskeletal ? ?(+) Arthritis , Osteoarthritis,   ? Abdominal ?  ?Peds ? Hematology ?negative hematology ROS ?(+)   ?Anesthesia Other Findings ? ? Reproductive/Obstetrics ?negative OB ROS ? ?  ? ? ? ? ? ? ? ? ? ? ? ? ? ?  ?  ? ? ? ? ? ? ?Anesthesia Physical ?Anesthesia Plan ? ?ASA: 3 ? ?Anesthesia Plan: Spinal  ? ?Post-op Pain Management: Regional block* and Ofirmev IV (intra-op)*  ? ?Induction: Intravenous ? ?PONV Risk Score and Plan: 2 and Propofol infusion, Ondansetron and Dexamethasone ? ?Airway Management Planned: Natural Airway and Simple Face Mask ? ?Additional Equipment:  ? ?Intra-op Plan:  ? ?Post-operative Plan:  ? ?Informed Consent: I have reviewed the patients History and Physical, chart, labs and discussed the procedure including the risks, benefits and alternatives for the proposed anesthesia with the patient or authorized representative who has indicated his/her understanding and acceptance.  ? ? ? ?Dental advisory given ? ?Plan Discussed with: CRNA ? ?Anesthesia Plan Comments: (See PAT note 08/12/2021, Konrad Felix Ward, PA-C)   ? ? ? ? ? ?Anesthesia Quick Evaluation ? ?

## 2021-08-12 NOTE — Progress Notes (Signed)
Anesthesia Chart Review ? ? Case: [id number] Date/Time: 08/15/21 0910  ? Procedure: TOTAL KNEE ARTHROPLASTY (Left: Knee)  ? Anesthesia type: Choice  ? Pre-op diagnosis: left knee osteoarthritis  ? [facility] 09 / [facility]  ? Surgeons: Gaynelle Arabian, MD  ? ?  ? ? ?DISCUSSION:66 y.o. former smoker with h/o DM II, pacemaker in place, OSA, left knee OA scheduled for above procedure 08/15/2021 with Dr. Hector Shade.  ? ?Per cardiology preoperative evaluation 06/09/2021, "Chart reviewed as part of pre-operative protocol coverage. Patient was contacted 06/09/2021 in reference to pre-operative risk assessment for pending surgery as outlined below.  Glenn Lyons was last seen on 04/13/2021 by Estella Husk PA-C.  Since that day, Glenn Lyons has done well without exertional chest pain or worsening dyspnea. ?  ?According to the Revised Cardiac Risk Index (RCRI), his Perioperative Risk of Major Cardiac Event is (%): 0.4 ?  ?Per previous device clinic recommendation: ?Have magnet available. ?Provide continuous ECG monitoring when magnet is used or reprogramming is to be performed.  ?Procedure should not interfere with device function.  No device programming or magnet placement needed. ?  ?Therefore, based on ACC/AHA guidelines, the patient would be at acceptable risk for the planned procedure without further cardiovascular testing. " ? ?Anticipate pt can proceed with planned procedure barring acute status change.   ?VS: BP 121/81   Pulse 62   Temp 36.7 ?C (Oral)   Resp 18   Ht 6' (1.829 m)   Wt (!) 136.5 kg   SpO2 96%   BMI 40.82 kg/m?  ? ?PROVIDERS: ?Tonia Ghent, MD is PCP  ? ?Cardiologist - Virl Axe, MD ?LABS: Labs reviewed: Acceptable for surgery. ?(all labs ordered are listed, but only abnormal results are displayed) ? ?Labs Reviewed  ?COMPREHENSIVE METABOLIC PANEL - Abnormal; Notable for the following components:  ?    Result Value  ? Sodium 134 (*)   ? Glucose, Bld 155 (*)   ? All other components  within normal limits  ?GLUCOSE, CAPILLARY - Abnormal; Notable for the following components:  ? Glucose-Capillary 193 (*)   ? All other components within normal limits  ?SURGICAL PCR SCREEN  ?CBC  ?PROTIME-INR  ?HEMOGLOBIN A1C  ?TYPE AND SCREEN  ? ? ? ?IMAGES: ? ? ?EKG: ?04/13/2021 ?Rate 60 bpm  ?Atrial paced ? ?CV: ?Myocardial Perfusion 11/13/2019 ?The left ventricular ejection fraction is normal (55-65%). ?Nuclear stress EF: 63%. ?There was no ST segment deviation noted during stress. ?The study is normal. ?This is a low risk study. ? ?Echo 03/13/2017 ?Study Conclusions  ? ?- Left ventricle: The cavity size was normal. Wall thickness was  ?  increased in a pattern of moderate LVH. Systolic function was  ?  normal. The estimated ejection fraction was in the range of 60%  ?  to 65%. Features are consistent with a pseudonormal left  ?  ventricular filling pattern, with concomitant abnormal relaxation  ?  and increased filling pressure (grade 2 diastolic dysfunction).  ?- Right atrium: The atrium was mildly dilated.  ? ?Impressions:  ? ?- Extremely technically difficult echo .  ?Past Medical History:  ?Diagnosis Date  ? Achalasia   ? with prev eval at [facility].  No intervention as of 2012  ? Arthritis   ? Aspiration pneumonia ([place])   ? Backache, unspecified   ? Cardiac pacemaker St. Jude   ? ERI 2008  ? Depressive disorder, not elsewhere classified   ? Diabetes ([place])   ?  Morbid obesity ([place])   ? Obstructive sleep apnea   ? biapap settign 25-22  ? Presence of permanent cardiac pacemaker   ? St. Jude- Dr. Caryl Comes follows -device Check 07-06-14  ? Sinus bradycardia /pauses   ? Stricture and stenosis of esophagus   ? ? ?Past Surgical History:  ?Procedure Laterality Date  ? BALLOON DILATION N/A 10/06/2015  ? Procedure: BALLOON DILATION;  Surgeon: Mauri Pole, MD;  Location: [place] [facility];  Service: Endoscopy;  Laterality: N/A;  ? BIOPSY  05/06/2018  ? Procedure: BIOPSY;  Surgeon: Irving Copas., MD;  Location: [place];  Service: Gastroenterology;;  ? COLONOSCOPY W/ POLYPECTOMY  11/01/2010  ? COLONOSCOPY WITH PROPOFOL N/A 10/05/2014  ? Procedure: COLONOSCOPY WITH PROPOFOL;  Surgeon: Gatha Mayer, MD;  [facility];  Service: Endoscopy;  Laterality: N/A;  ? COLONOSCOPY WITH PROPOFOL N/A 05/06/2018  ? Procedure: COLONOSCOPY WITH PROPOFOL;  Surgeon: Mansouraty, Telford Nab., MD;  Location: [place];  Service: Gastroenterology;  Laterality: N/A;  ? ESOPHAGEAL DILATION    ? ESOPHAGEAL MANOMETRY N/A 08/23/2015  ? Procedure: ESOPHAGEAL MANOMETRY (EM);  Surgeon: Mauri Pole, MD;  [facility];  Service: Endoscopy;  Laterality: N/A;  ? ESOPHAGOGASTRODUODENOSCOPY N/A 10/09/2014  ? Procedure: ESOPHAGOGASTRODUODENOSCOPY (EGD);  Surgeon: Gatha Mayer, MD;  Location: [facility];  Service: Endoscopy;  Laterality: N/A;  ? ESOPHAGOGASTRODUODENOSCOPY (EGD) WITH PROPOFOL N/A 08/23/2015  ? Procedure: ESOPHAGOGASTRODUODENOSCOPY (EGD) WITH PROPOFOL;  Surgeon: Mauri Pole, MD;  [facility];  Service: Endoscopy;  Laterality: N/A;  ? ESOPHAGOGASTRODUODENOSCOPY (EGD) WITH PROPOFOL N/A 10/06/2015  ? Procedure: ESOPHAGOGASTRODUODENOSCOPY (EGD) WITH PROPOFOL;  Surgeon: Mauri Pole, MD;  Location: [place] [facility];  Service: Endoscopy;  Laterality: N/A;  Rigiflex ballon size 80m-35mm size 45 minute proc,need Fluro ?Gastografin esophagram 2 hrs post EGD ?  ? GASTRIC ROUX-EN-Y N/A 04/05/2020  ? Procedure: LAPAROSCOPIC ROUX-EN-Y GASTRIC BYPASS WITH UPPER ENDOSCOPY;  Surgeon: WGreer Pickerel MD;  [facility];  Service: General;  Laterality: N/A;  ? PACEMAKER INSERTION    ? POLYPECTOMY  05/06/2018  ? Procedure: POLYPECTOMY;  Surgeon: MRush LandmarkGTelford Nab, MD;  [place]  Service: Gastroenterology;;  ? PJonesN/A 05/01/2018  ? Procedure: PPM GENERATOR CHANGEOUT;  Surgeon: KDeboraha Sprang MD;  [facility];  Service: Cardiovascular;  Laterality: N/A;   ? ? ?MEDICATIONS: ? acetaminophen (TYLENOL) 500 MG tablet  ? Aromatic Inhalants (VICKS VAPOR INHALER IN)  ? Blood Glucose Monitoring Suppl (ONETOUCH VERIO) w/Device KIT  ? Calcium Carbonate (CALCIUM 500 PO)  ? HYDROcodone-acetaminophen (NORCO/VICODIN) 5-325 MG tablet  ? Lancets (ONETOUCH ULTRASOFT) lancets  ? Multiple Vitamins-Minerals (BARIATRIC MULTIVITAMINS/IRON PO)  ? ONETOUCH VERIO test strip  ? ?No current facility-administered medications for this encounter.  ? ? ? ?JKonrad FelixWard, PA-C ?[facility] ?[phone]? ? ? ? ? ?

## 2021-08-15 ENCOUNTER — Observation Stay (HOSPITAL_COMMUNITY)
Admission: RE | Admit: 2021-08-15 | Discharge: 2021-08-16 | Disposition: A | Discharge status: Home / Self Care | Payer: BC Managed Care – PPO | Attending: Orthopedic Surgery | Admitting: Orthopedic Surgery

## 2021-08-15 ENCOUNTER — Ambulatory Visit (HOSPITAL_COMMUNITY): Payer: BC Managed Care – PPO | Admitting: Physician Assistant

## 2021-08-15 ENCOUNTER — Encounter (HOSPITAL_COMMUNITY): Payer: Self-pay | Admitting: Orthopedic Surgery

## 2021-08-15 ENCOUNTER — Other Ambulatory Visit: Payer: Self-pay

## 2021-08-15 ENCOUNTER — Ambulatory Visit (HOSPITAL_COMMUNITY): Payer: BC Managed Care – PPO | Admitting: Anesthesiology

## 2021-08-15 ENCOUNTER — Encounter (HOSPITAL_COMMUNITY)
Admission: RE | Disposition: A | Discharge status: Home / Self Care | Payer: Self-pay | Source: Home / Self Care | Attending: Orthopedic Surgery

## 2021-08-15 DIAGNOSIS — Z950 Presence of cardiac pacemaker: Secondary | ICD-10-CM | POA: Diagnosis not present

## 2021-08-15 DIAGNOSIS — M1712 Unilateral primary osteoarthritis, left knee: Principal | ICD-10-CM | POA: Diagnosis present

## 2021-08-15 DIAGNOSIS — Z87891 Personal history of nicotine dependence: Secondary | ICD-10-CM | POA: Insufficient documentation

## 2021-08-15 DIAGNOSIS — Z79899 Other long term (current) drug therapy: Secondary | ICD-10-CM | POA: Insufficient documentation

## 2021-08-15 DIAGNOSIS — E119 Type 2 diabetes mellitus without complications: Secondary | ICD-10-CM | POA: Insufficient documentation

## 2021-08-15 DIAGNOSIS — M1710 Unilateral primary osteoarthritis, unspecified knee: Secondary | ICD-10-CM | POA: Diagnosis present

## 2021-08-15 DIAGNOSIS — I10 Essential (primary) hypertension: Secondary | ICD-10-CM | POA: Diagnosis not present

## 2021-08-15 HISTORY — PX: TOTAL KNEE ARTHROPLASTY: SHX125

## 2021-08-15 LAB — GLUCOSE, CAPILLARY
Glucose-Capillary: 144 mg/dL — ABNORMAL HIGH (ref 70–99)
Glucose-Capillary: 204 mg/dL — ABNORMAL HIGH (ref 70–99)
Glucose-Capillary: 238 mg/dL — ABNORMAL HIGH (ref 70–99)
Glucose-Capillary: 240 mg/dL — ABNORMAL HIGH (ref 70–99)

## 2021-08-15 LAB — TYPE AND SCREEN
ABO/RH(D): A POS
Antibody Screen: NEGATIVE

## 2021-08-15 SURGERY — ARTHROPLASTY, KNEE, TOTAL
Anesthesia: Spinal | Site: Knee | Laterality: Left

## 2021-08-15 MED ORDER — BUPIVACAINE IN DEXTROSE 0.75-8.25 % IT SOLN
INTRATHECAL | Status: DC | PRN
  Administered 2021-08-15: 1.8 mL via INTRATHECAL

## 2021-08-15 MED ORDER — LACTATED RINGERS IV SOLN: INTRAVENOUS | Status: DC

## 2021-08-15 MED ORDER — CHLORHEXIDINE GLUCONATE 0.12 % MT SOLN
15.0000 mL | Freq: Once | OROMUCOSAL | Status: AC
  Administered 2021-08-15: 15 mL via OROMUCOSAL

## 2021-08-15 MED ORDER — ONDANSETRON HCL 4 MG/2ML IJ SOLN: 4.0000 mg | Freq: Four times a day (QID) | INTRAMUSCULAR | Status: DC | PRN

## 2021-08-15 MED ORDER — HYDROMORPHONE HCL 1 MG/ML IJ SOLN: 0.5000 mg | INTRAMUSCULAR | Status: DC | PRN

## 2021-08-15 MED ORDER — PHENOL 1.4 % MT LIQD: 1.0000 | OROMUCOSAL | Status: DC | PRN

## 2021-08-15 MED ORDER — GABAPENTIN 300 MG PO CAPS
300.0000 mg | ORAL_CAPSULE | Freq: Three times a day (TID) | ORAL | Status: DC
  Administered 2021-08-15 – 2021-08-16 (×3): 300 mg via ORAL
  Filled 2021-08-15 (×3): qty 1

## 2021-08-15 MED ORDER — PROPOFOL 1000 MG/100ML IV EMUL
INTRAVENOUS | Status: AC
  Filled 2021-08-15: qty 100

## 2021-08-15 MED ORDER — TRANEXAMIC ACID-NACL 1000-0.7 MG/100ML-% IV SOLN
1000.0000 mg | INTRAVENOUS | Status: AC
  Administered 2021-08-15: 1000 mg via INTRAVENOUS
  Filled 2021-08-15: qty 100

## 2021-08-15 MED ORDER — ACETAMINOPHEN 10 MG/ML IV SOLN
1000.0000 mg | Freq: Four times a day (QID) | INTRAVENOUS | Status: DC
  Administered 2021-08-15: 1000 mg via INTRAVENOUS
  Filled 2021-08-15: qty 100

## 2021-08-15 MED ORDER — HYDROMORPHONE HCL 1 MG/ML IJ SOLN: 0.2500 mg | INTRAMUSCULAR | Status: DC | PRN

## 2021-08-15 MED ORDER — RIVAROXABAN 10 MG PO TABS
10.0000 mg | ORAL_TABLET | Freq: Every day | ORAL | Status: DC
  Administered 2021-08-16: 10 mg via ORAL
  Filled 2021-08-15: qty 1

## 2021-08-15 MED ORDER — PHENYLEPHRINE HCL-NACL 20-0.9 MG/250ML-% IV SOLN
INTRAVENOUS | Status: DC | PRN
  Administered 2021-08-15: 50 ug/min via INTRAVENOUS

## 2021-08-15 MED ORDER — SODIUM CHLORIDE 0.9 % IR SOLN
Status: DC | PRN
  Administered 2021-08-15 (×2): 1000 mL

## 2021-08-15 MED ORDER — CEFAZOLIN SODIUM-DEXTROSE 2-4 GM/100ML-% IV SOLN
2.0000 g | Freq: Four times a day (QID) | INTRAVENOUS | Status: AC
  Administered 2021-08-15 (×2): 2 g via INTRAVENOUS
  Filled 2021-08-15 (×2): qty 100

## 2021-08-15 MED ORDER — SODIUM CHLORIDE (PF) 0.9 % IJ SOLN
INTRAMUSCULAR | Status: AC
  Filled 2021-08-15: qty 10

## 2021-08-15 MED ORDER — BUPIVACAINE LIPOSOME 1.3 % IJ SUSP
INTRAMUSCULAR | Status: AC
  Filled 2021-08-15: qty 20

## 2021-08-15 MED ORDER — OXYCODONE HCL 5 MG PO TABS
10.0000 mg | ORAL_TABLET | ORAL | Status: DC | PRN
  Administered 2021-08-15 – 2021-08-16 (×3): 15 mg via ORAL
  Filled 2021-08-15 (×3): qty 3

## 2021-08-15 MED ORDER — OXYCODONE HCL 5 MG PO TABS
5.0000 mg | ORAL_TABLET | ORAL | Status: DC | PRN
  Administered 2021-08-15 – 2021-08-16 (×2): 10 mg via ORAL
  Filled 2021-08-15 (×2): qty 2

## 2021-08-15 MED ORDER — ONDANSETRON HCL 4 MG/2ML IJ SOLN
INTRAMUSCULAR | Status: DC | PRN
  Administered 2021-08-15: 4 mg via INTRAVENOUS

## 2021-08-15 MED ORDER — MENTHOL 3 MG MT LOZG: 1.0000 | LOZENGE | OROMUCOSAL | Status: DC | PRN

## 2021-08-15 MED ORDER — ONDANSETRON HCL 4 MG/2ML IJ SOLN
INTRAMUSCULAR | Status: AC
  Filled 2021-08-15: qty 2

## 2021-08-15 MED ORDER — PROPOFOL 500 MG/50ML IV EMUL
INTRAVENOUS | Status: DC | PRN
  Administered 2021-08-15: 130 ug/kg/min via INTRAVENOUS

## 2021-08-15 MED ORDER — POVIDONE-IODINE 10 % EX SWAB
2.0000 "application " | Freq: Once | CUTANEOUS | Status: AC
  Administered 2021-08-15: 2 via TOPICAL

## 2021-08-15 MED ORDER — SODIUM CHLORIDE (PF) 0.9 % IJ SOLN
INTRAMUSCULAR | Status: AC
  Filled 2021-08-15: qty 50

## 2021-08-15 MED ORDER — FENTANYL CITRATE PF 50 MCG/ML IJ SOSY
PREFILLED_SYRINGE | INTRAMUSCULAR | Status: AC
  Administered 2021-08-15: 50 ug
  Filled 2021-08-15: qty 1

## 2021-08-15 MED ORDER — INSULIN ASPART 100 UNIT/ML IJ SOLN
0.0000 [IU] | Freq: Three times a day (TID) | INTRAMUSCULAR | Status: DC
  Administered 2021-08-15 (×2): 5 [IU] via SUBCUTANEOUS
  Administered 2021-08-16: 3 [IU] via SUBCUTANEOUS
  Administered 2021-08-16: 2 [IU] via SUBCUTANEOUS

## 2021-08-15 MED ORDER — METHOCARBAMOL 500 MG IVPB - SIMPLE MED
500.0000 mg | Freq: Four times a day (QID) | INTRAVENOUS | Status: DC | PRN
  Filled 2021-08-15: qty 50

## 2021-08-15 MED ORDER — DEXAMETHASONE SODIUM PHOSPHATE 10 MG/ML IJ SOLN
INTRAMUSCULAR | Status: AC
  Filled 2021-08-15: qty 1

## 2021-08-15 MED ORDER — SODIUM CHLORIDE 0.9 % IV SOLN
INTRAVENOUS | Status: DC | PRN
  Administered 2021-08-15: 80 mL

## 2021-08-15 MED ORDER — ONDANSETRON HCL 4 MG PO TABS: 4.0000 mg | ORAL_TABLET | Freq: Four times a day (QID) | ORAL | Status: DC | PRN

## 2021-08-15 MED ORDER — ACETAMINOPHEN 500 MG PO TABS
1000.0000 mg | ORAL_TABLET | Freq: Four times a day (QID) | ORAL | Status: AC
  Administered 2021-08-15 – 2021-08-16 (×4): 1000 mg via ORAL
  Filled 2021-08-15 (×4): qty 2

## 2021-08-15 MED ORDER — PROPOFOL 500 MG/50ML IV EMUL
INTRAVENOUS | Status: AC
  Filled 2021-08-15: qty 50

## 2021-08-15 MED ORDER — DOCUSATE SODIUM 100 MG PO CAPS
100.0000 mg | ORAL_CAPSULE | Freq: Two times a day (BID) | ORAL | Status: DC
  Administered 2021-08-15 – 2021-08-16 (×2): 100 mg via ORAL
  Filled 2021-08-15 (×2): qty 1

## 2021-08-15 MED ORDER — MIDAZOLAM HCL 2 MG/2ML IJ SOLN
INTRAMUSCULAR | Status: AC
  Administered 2021-08-15: 2 mg
  Filled 2021-08-15: qty 2

## 2021-08-15 MED ORDER — BUPIVACAINE LIPOSOME 1.3 % IJ SUSP: 20.0000 mL | Freq: Once | INTRAMUSCULAR | Status: DC

## 2021-08-15 MED ORDER — METHOCARBAMOL 500 MG PO TABS
500.0000 mg | ORAL_TABLET | Freq: Four times a day (QID) | ORAL | Status: DC | PRN
  Administered 2021-08-15 – 2021-08-16 (×3): 500 mg via ORAL
  Filled 2021-08-15 (×3): qty 1

## 2021-08-15 MED ORDER — INSULIN ASPART 100 UNIT/ML IJ SOLN
0.0000 [IU] | Freq: Every day | INTRAMUSCULAR | Status: DC
  Administered 2021-08-15: 2 [IU] via SUBCUTANEOUS

## 2021-08-15 MED ORDER — DEXAMETHASONE SODIUM PHOSPHATE 10 MG/ML IJ SOLN
8.0000 mg | Freq: Once | INTRAMUSCULAR | Status: AC
  Administered 2021-08-15: 8 mg via INTRAVENOUS

## 2021-08-15 MED ORDER — SODIUM CHLORIDE 0.9 % IV SOLN: INTRAVENOUS | Status: DC

## 2021-08-15 MED ORDER — TRAMADOL HCL 50 MG PO TABS: 50.0000 mg | ORAL_TABLET | Freq: Four times a day (QID) | ORAL | Status: DC | PRN

## 2021-08-15 MED ORDER — CEFAZOLIN IN SODIUM CHLORIDE 3-0.9 GM/100ML-% IV SOLN
3.0000 g | INTRAVENOUS | Status: AC
  Administered 2021-08-15: 3 g via INTRAVENOUS
  Filled 2021-08-15: qty 100

## 2021-08-15 MED ORDER — FENTANYL CITRATE PF 50 MCG/ML IJ SOSY
PREFILLED_SYRINGE | INTRAMUSCULAR | Status: AC
  Filled 2021-08-15: qty 1

## 2021-08-15 MED ORDER — METOCLOPRAMIDE HCL 5 MG/ML IJ SOLN: 5.0000 mg | Freq: Three times a day (TID) | INTRAMUSCULAR | Status: DC | PRN

## 2021-08-15 MED ORDER — PROPOFOL 10 MG/ML IV BOLUS
INTRAVENOUS | Status: DC | PRN
  Administered 2021-08-15: 20 mg via INTRAVENOUS

## 2021-08-15 MED ORDER — ORAL CARE MOUTH RINSE: 15.0000 mL | Freq: Once | OROMUCOSAL | Status: AC

## 2021-08-15 MED ORDER — METOCLOPRAMIDE HCL 5 MG PO TABS: 5.0000 mg | ORAL_TABLET | Freq: Three times a day (TID) | ORAL | Status: DC | PRN

## 2021-08-15 MED ORDER — BUPIVACAINE-EPINEPHRINE (PF) 0.5% -1:200000 IJ SOLN
INTRAMUSCULAR | Status: DC | PRN
  Administered 2021-08-15: 20 mL via PERINEURAL

## 2021-08-15 SURGICAL SUPPLY — 53 items
ATTUNE MED DOME PAT 41 KNEE (Knees) ×1 IMPLANT
ATTUNE PS FEM LT SZ 7 CEM KNEE (Femur) ×1 IMPLANT
ATTUNE PSRP INSR SZ7 10 KNEE (Insert) ×1 IMPLANT
BAG COUNTER SPONGE SURGICOUNT (BAG) ×1 IMPLANT
BAG SPEC THK2 15X12 ZIP CLS (MISCELLANEOUS) ×1
BAG SPNG CNTER NS LX DISP (BAG) ×1
BAG ZIPLOCK 12X15 (MISCELLANEOUS) ×2 IMPLANT
BASE TIBIAL ROT PLAT SZ 7 KNEE (Knees) IMPLANT
BLADE SAG 18X100X1.27 (BLADE) ×2 IMPLANT
BLADE SAW SGTL 11.0X1.19X90.0M (BLADE) ×2 IMPLANT
BNDG ELASTIC 6X5.8 VLCR STR LF (GAUZE/BANDAGES/DRESSINGS) ×2 IMPLANT
BOWL SMART MIX CTS (DISPOSABLE) ×2 IMPLANT
BSPLAT TIB 7 CMNT ROT PLAT STR (Knees) ×1 IMPLANT
CEMENT HV SMART SET (Cement) ×4 IMPLANT
COVER SURGICAL LIGHT HANDLE (MISCELLANEOUS) ×2 IMPLANT
CUFF TOURN SGL QUICK 34 (TOURNIQUET CUFF) ×2
CUFF TRNQT CYL 34X4.125X (TOURNIQUET CUFF) ×1 IMPLANT
DRAPE INCISE IOBAN 66X45 STRL (DRAPES) ×2 IMPLANT
DRAPE U-SHAPE 47X51 STRL (DRAPES) ×2 IMPLANT
DRSG AQUACEL AG ADV 3.5X10 (GAUZE/BANDAGES/DRESSINGS) ×2 IMPLANT
DURAPREP 26ML APPLICATOR (WOUND CARE) ×2 IMPLANT
ELECT REM PT RETURN 15FT ADLT (MISCELLANEOUS) ×2 IMPLANT
GLOVE SRG 8 PF TXTR STRL LF DI (GLOVE) ×1 IMPLANT
GLOVE SURG ENC MOIS LTX SZ6.5 (GLOVE) ×2 IMPLANT
GLOVE SURG ENC MOIS LTX SZ8 (GLOVE) ×4 IMPLANT
GLOVE SURG UNDER POLY LF SZ7 (GLOVE) ×2 IMPLANT
GLOVE SURG UNDER POLY LF SZ8 (GLOVE) ×2
GLOVE SURG UNDER POLY LF SZ8.5 (GLOVE) ×2 IMPLANT
GOWN STRL REUS W/ TWL LRG LVL3 (GOWN DISPOSABLE) ×1 IMPLANT
GOWN STRL REUS W/TWL LRG LVL3 (GOWN DISPOSABLE) ×2
HANDPIECE INTERPULSE COAX TIP (DISPOSABLE) ×2
HOLDER FOLEY CATH W/STRAP (MISCELLANEOUS) ×1 IMPLANT
IMMOBILIZER KNEE 20 (SOFTGOODS) ×2
IMMOBILIZER KNEE 20 THIGH 36 (SOFTGOODS) ×1 IMPLANT
KIT TURNOVER KIT A (KITS) IMPLANT
MANIFOLD NEPTUNE II (INSTRUMENTS) ×2 IMPLANT
NS IRRIG 1000ML POUR BTL (IV SOLUTION) ×2 IMPLANT
PACK TOTAL KNEE CUSTOM (KITS) ×2 IMPLANT
PADDING CAST COTTON 6X4 STRL (CAST SUPPLIES) ×3 IMPLANT
PROTECTOR NERVE ULNAR (MISCELLANEOUS) ×2 IMPLANT
SET HNDPC FAN SPRY TIP SCT (DISPOSABLE) ×1 IMPLANT
SPIKE FLUID TRANSFER (MISCELLANEOUS) ×1 IMPLANT
STRIP CLOSURE SKIN 1/2X4 (GAUZE/BANDAGES/DRESSINGS) ×4 IMPLANT
SUT MNCRL AB 4-0 PS2 18 (SUTURE) ×2 IMPLANT
SUT STRATAFIX 0 PDS 27 VIOLET (SUTURE) ×2
SUT VIC AB 2-0 CT1 27 (SUTURE) ×6
SUT VIC AB 2-0 CT1 TAPERPNT 27 (SUTURE) ×3 IMPLANT
SUTURE STRATFX 0 PDS 27 VIOLET (SUTURE) ×1 IMPLANT
TIBIAL BASE ROT PLAT SZ 7 KNEE (Knees) ×2 IMPLANT
TRAY FOLEY MTR SLVR 16FR STAT (SET/KITS/TRAYS/PACK) ×2 IMPLANT
TUBE SUCTION HIGH CAP CLEAR NV (SUCTIONS) ×2 IMPLANT
WATER STERILE IRR 1000ML POUR (IV SOLUTION) ×4 IMPLANT
WRAP KNEE MAXI GEL POST OP (GAUZE/BANDAGES/DRESSINGS) ×2 IMPLANT

## 2021-08-15 NOTE — Progress Notes (Signed)
Assisted Dr. Edmond Fitzgerald with left, ultrasound guided, adductor canal block. Side rails up, monitors on throughout procedure. See vital signs in flow sheet. Tolerated Procedure well. 

## 2021-08-15 NOTE — Progress Notes (Signed)
Orthopedic Tech Progress Note ?Patient Details:  ?Glenn Lyons ?06/24/1955 ?[id number] ? ?CPM Left Knee ?CPM Left Knee: On ?Left Knee Flexion (Degrees): 40 ?Left Knee Extension (Degrees): 10 ? ?Post Interventions ?Patient Tolerated: Well ?Instructions Provided: Care of device, Adjustment of device ? ?[place] Pink ?08/15/2021, 11:03 AM ? ?

## 2021-08-15 NOTE — Op Note (Signed)
OPERATIVE REPORT-TOTAL KNEE ARTHROPLASTY ? ? ?Pre-operative diagnosis- Osteoarthritis  Left knee(s) ? ?Post-operative diagnosis- Osteoarthritis Left knee(s) ? ?Procedure-  Left  Total Knee Arthroplasty ? ?Surgeon- Glenn Plover. Takiera Mayo, MD ? ?Assistant- Theresa Duty, PA-C  ? ?Anesthesia-   Adductor canal block and spinal ? ?EBL-20 mL ?  ?Drains None ? ?Tourniquet time- 48 minutes @ 300 mm Hg ? ?Complications- None ? ?Condition-PACU - hemodynamically stable.  ? ?Brief Clinical Note   Glenn Lyons is a 66 y.o. year old male with end stage OA of his left knee with progressively worsening pain and dysfunction. He has constant pain, with activity and at rest and significant functional deficits with difficulties even with ADLs. He has had extensive non-op management including analgesics, injections of cortisone and viscosupplements, and home exercise program, but remains in significant pain with significant dysfunction. Radiographs show bone on bone arthritis medial and patellofemoral. He presents now for left Total Knee Arthroplasty.    ? ?Procedure in detail--- ? ? The patient is brought into the operating room and positioned supine on the operating table. After successful administration of  Adductor canal block and spinal,   a tourniquet is placed high on the  Left thigh(s) and the lower extremity is prepped and draped in the usual sterile fashion. Time out is performed by the operating team and then the  Left lower extremity is wrapped in Esmarch, knee flexed and the tourniquet inflated to 300 mmHg.  ?     A midline incision is made with a ten blade through the subcutaneous tissue to the level of the extensor mechanism. A fresh blade is used to make a medial parapatellar arthrotomy. Soft tissue over the proximal medial tibia is subperiosteally elevated to the joint line with a knife and into the semimembranosus bursa with a Cobb elevator. Soft tissue over the proximal lateral tibia is elevated with attention  being paid to avoiding the patellar tendon on the tibial tubercle. The patella is everted, knee flexed 90 degrees and the ACL and PCL are removed. Findings are bone on bone medial and patellofemoral with large global osteophytes   ?     The drill is used to create a starting hole in the distal femur and the canal is thoroughly irrigated with sterile saline to remove the fatty contents. The 5 degree Left  valgus alignment guide is placed into the femoral canal and the distal femoral cutting block is pinned to remove 9 mm off the distal femur. Resection is made with an oscillating saw. ?     The tibia is subluxed forward and the menisci are removed. The extramedullary alignment guide is placed referencing proximally at the medial aspect of the tibial tubercle and distally along the second metatarsal axis and tibial crest. The block is pinned to remove 1m off the more deficient medial  side. Resection is made with an oscillating saw. Size 7is the most appropriate size for the tibia and the proximal tibia is prepared with the modular drill and keel punch for that size. ?     The femoral sizing guide is placed and size 7 is most appropriate. Rotation is marked off the epicondylar axis and confirmed by creating a rectangular flexion gap at 90 degrees. The size 7 cutting block is pinned in this rotation and the anterior, posterior and chamfer cuts are made with the oscillating saw. The intercondylar block is then placed and that cut is made. ?     Trial size 7 tibial component, trial  size 7 posterior stabilized femur and a 10  mm posterior stabilized rotating platform insert trial is placed. Full extension is achieved with excellent varus/valgus and anterior/posterior balance throughout full range of motion. The patella is everted and thickness measured to be 27  mm. Free hand resection is taken to 15 mm, a 41 template is placed, lug holes are drilled, trial patella is placed, and it tracks normally. Osteophytes are  removed off the posterior femur with the trial in place. All trials are removed and the cut bone surfaces prepared with pulsatile lavage. Cement is mixed and once ready for implantation, the size 7 tibial implant, size  7 posterior stabilized femoral component, and the size 41 patella are cemented in place and the patella is held with the clamp. The trial insert is placed and the knee held in full extension. The Exparel (20 ml mixed with 60 ml saline) is injected into the extensor mechanism, posterior capsule, medial and lateral gutters and subcutaneous tissues.  All extruded cement is removed and once the cement is hard the permanent 10 mm posterior stabilized rotating platform insert is placed into the tibial tray. ?     The wound is copiously irrigated with saline solution and the extensor mechanism closed with # 0 Stratofix suture. The tourniquet is released for a total tourniquet time of 48  minutes. Flexion against gravity is 140 degrees and the patella tracks normally. Subcutaneous tissue is closed with 2.0 vicryl and subcuticular with running 4.0 Monocryl. The incision is cleaned and dried and steri-strips and a bulky sterile dressing are applied. The limb is placed into a knee immobilizer and the patient is awakened and transported to recovery in stable condition. ?     Please note that a surgical assistant was a medical necessity for this procedure in order to perform it in a safe and expeditious manner. Surgical assistant was necessary to retract the ligaments and vital neurovascular structures to prevent injury to them and also necessary for proper positioning of the limb to allow for anatomic placement of the prosthesis. ? ? ?Glenn Plover Athaliah Baumbach, MD ? ? ? ?08/15/2021, 10:33 AM ? ? ?

## 2021-08-15 NOTE — H&P (Signed)
TOTAL KNEE ADMISSION H&P ? ?Patient is being admitted for left total knee arthroplasty. ? ?Subjective: ? ?Chief Complaint: Left knee pain. ? ?HPI: Glenn Lyons, 66 y.o. male has a history of pain and functional disability in the left knee due to arthritis and has failed non-surgical conservative treatments for greater than 12 weeks to include corticosteriod injections and activity modification. Onset of symptoms was gradual, starting  several  years ago with gradually worsening course since that time. The patient noted no past surgery on the left knee.  Patient currently rates pain in the left knee at 7 out of 10 with activity. Patient has night pain, worsening of pain with activity and weight bearing, pain with passive range of motion, and crepitus. Patient has evidence of subchondral cysts, periarticular osteophytes, and joint space narrowing by imaging studies.There is no active infection. ? ?Patient Active Problem List  ? Diagnosis Date Noted  ? Double vision 01/05/2021  ? S/P gastric bypass 04/05/2020  ? Need for shingles vaccine 01/28/2020  ? Foreskin problem 08/10/2019  ? HLD (hyperlipidemia) 08/05/2017  ? Routine general medical examination at a health care facility 07/13/2016  ? Advance care planning 07/13/2016  ? Type 2 diabetes mellitus with diabetic neuropathy, unspecified ([place]) 03/17/2016  ? Lower urinary tract symptoms (LUTS) 03/16/2016  ? Dysphagia   ? Essential hypertension   ? Hx of colonic polyps   ? Fatigue 08/04/2014  ? Rash and nonspecific skin eruption 10/21/2013  ? Morbid obesity with BMI of 50.0-59.9, adult ([place]) 05/24/2013  ? History of colonic polyps 04/29/2013  ? Anxiety state 03/10/2013  ? Pain in joint, shoulder region 02/08/2013  ? Sinus bradycardia /pauses   ? ED (erectile dysfunction) 02/21/2012  ? Edema 01/31/2012  ? Skin lesion 01/08/2012  ? OA (osteoarthritis) of knee 06/15/2011  ? Paresthesia 01/08/2011  ? Achalasia 08/17/2010  ? DEPRESSION 01/21/2010  ? Degenerative arthritis  of left knee 01/21/2010  ? ANGINA, HX OF 01/21/2010  ? PACEMAKER, [employer] Judes 01/21/2010  ? BACK PAIN 01/05/2010  ? Obesity 12/07/2009  ? GERD 12/07/2009  ? OSA (obstructive sleep apnea) 12/07/2009  ? ? ?Past Medical History:  ?Diagnosis Date  ? Achalasia   ? with prev eval at [facility].  No intervention as of 2012  ? Arthritis   ? Aspiration pneumonia ([place])   ? Backache, unspecified   ? Cardiac pacemaker St. Jude   ? ERI 2008  ? Depressive disorder, not elsewhere classified   ? Diabetes ([place])   ? Morbid obesity ([place])   ? Obstructive sleep apnea   ? biapap settign 25-22  ? Presence of permanent cardiac pacemaker   ? St. Jude- Dr. Caryl Comes follows -device Check 07-06-14  ? Sinus bradycardia /pauses   ? Stricture and stenosis of esophagus   ? ? ?Past Surgical History:  ?Procedure Laterality Date  ? BALLOON DILATION N/A 10/06/2015  ? Procedure: BALLOON DILATION;  Surgeon: Mauri Pole, MD;  Location: [place] [facility];  Service: Endoscopy;  Laterality: N/A;  ? BIOPSY  05/06/2018  ? Procedure: BIOPSY;  Surgeon: Irving Copas., MD;  Location: [place];  Service: Gastroenterology;;  ? COLONOSCOPY W/ POLYPECTOMY  11/01/2010  ? COLONOSCOPY WITH PROPOFOL N/A 10/05/2014  ? Procedure: COLONOSCOPY WITH PROPOFOL;  Surgeon: Gatha Mayer, MD;  [facility];  Service: Endoscopy;  Laterality: N/A;  ? COLONOSCOPY WITH PROPOFOL N/A 05/06/2018  ? Procedure: COLONOSCOPY WITH PROPOFOL;  Surgeon: Mansouraty, Telford Nab., MD;  Location: [place];  Service: Gastroenterology;  Laterality: N/A;  ?  ESOPHAGEAL DILATION    ? ESOPHAGEAL MANOMETRY N/A 08/23/2015  ? Procedure: ESOPHAGEAL MANOMETRY (EM);  Surgeon: Mauri Pole, MD;  [facility];  Service: Endoscopy;  Laterality: N/A;  ? ESOPHAGOGASTRODUODENOSCOPY N/A 10/09/2014  ? Procedure: ESOPHAGOGASTRODUODENOSCOPY (EGD);  Surgeon: Gatha Mayer, MD;  Location: [facility];  Service: Endoscopy;  Laterality: N/A;  ? ESOPHAGOGASTRODUODENOSCOPY (EGD) WITH PROPOFOL N/A  08/23/2015  ? Procedure: ESOPHAGOGASTRODUODENOSCOPY (EGD) WITH PROPOFOL;  Surgeon: Mauri Pole, MD;  [facility];  Service: Endoscopy;  Laterality: N/A;  ? ESOPHAGOGASTRODUODENOSCOPY (EGD) WITH PROPOFOL N/A 10/06/2015  ? Procedure: ESOPHAGOGASTRODUODENOSCOPY (EGD) WITH PROPOFOL;  Surgeon: Mauri Pole, MD;  Location: [place] [facility];  Service: Endoscopy;  Laterality: N/A;  Rigiflex ballon size 38mm-35mm size 45 minute proc,need Fluro ?Gastografin esophagram 2 hrs post EGD ?  ? GASTRIC ROUX-EN-Y N/A 04/05/2020  ? Procedure: LAPAROSCOPIC ROUX-EN-Y GASTRIC BYPASS WITH UPPER ENDOSCOPY;  Surgeon: Greer Pickerel, MD;  [facility];  Service: General;  Laterality: N/A;  ? PACEMAKER INSERTION    ? POLYPECTOMY  05/06/2018  ? Procedure: POLYPECTOMY;  Surgeon: Rush Landmark Telford Nab., MD;  Location: [place];  Service: Gastroenterology;;  ? [place] N/A 05/01/2018  ? Procedure: PPM GENERATOR CHANGEOUT;  Surgeon: Deboraha Sprang, MD;  Location: [place] [facility];  Service: Cardiovascular;  Laterality: N/A;  ? ? ?Prior to Admission medications   ?Medication Sig Start Date End Date Taking? Authorizing Provider  ?acetaminophen (TYLENOL) 500 MG tablet Take 1,000 mg by mouth every 6 (six) hours as needed for moderate pain.   Yes [provider]  ?Aromatic Inhalants (VICKS VAPOR INHALER IN) Place 1 spray into both nostrils daily as needed (congestion).   Yes [provider]  ?Calcium Carbonate (CALCIUM 500 PO) Take 1,500 mg by mouth daily.   Yes [provider]  ?Multiple Vitamins-Minerals (BARIATRIC MULTIVITAMINS/IRON PO) Take 2 tablets by mouth daily.   Yes [provider]  ?Blood Glucose Monitoring Suppl (ONETOUCH VERIO) w/Device KIT Inject 1 kit into the skin every morning. Use to test blood sugar each day before breakfast and 2 hours after a meal for 2 weeks.  ?Diagnosis:  E11.9  Non-insulin dependent. 06/13/16   Tonia Ghent, MD   ?HYDROcodone-acetaminophen (NORCO/VICODIN) 5-325 MG tablet Take 1 tablet by mouth every 8 (eight) hours as needed. ?Patient not taking: Reported on 08/10/2021 05/16/21   Rosemarie Ax, MD  ?Lancets [facility] ULTRASOFT) lancets USE TO TEST BLOOD SUGAR ONCE DAILY OR AS NEEDED 03/12/17   Tonia Ghent, MD  ?[facility] VERIO test strip USE TO TEST BLOOD SUGAR ONCE DAILY OR AS INSTRUCTED 10/04/20   Tonia Ghent, MD  ?enoxaparin (LOVENOX) 60 MG/0.6ML injection Inject 0.6 mLs (60 mg total) into the skin 2 (two) times daily for 28 days. 04/06/20 07/22/20  Greer Pickerel, MD  ?gabapentin (NEURONTIN) 100 MG capsule Take 2 capsules (200 mg total) by mouth every 12 (twelve) hours. 04/06/20 04/13/20  Greer Pickerel, MD  ?pantoprazole (PROTONIX) 40 MG tablet Take 1 tablet (40 mg total) by mouth daily. ?Patient not taking: No sig reported 04/06/20 07/22/20  Greer Pickerel, MD  ? ? ?Allergies  ?Allergen Reactions  ? Morphine Other (See Comments)  ?  Cardiac Arrest  ? Metformin And Related   ?  Intolerant of any dose due to aches  ? ? ?Social History  ? ?Socioeconomic History  ? Marital status: Married  ?  Spouse name: Not on file  ? Number of children: 2  ? Years of  education: Not on file  ? Highest education level: Not on file  ?Occupational History  ? 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 Occupation: singer  ?  Employer: [employer]  ?Tobacco Use  ? Smoking status: Former  ?  Packs/day: 1.00  ?  Years: 20.00  ?  Pack years: 20.00  ?  Types: Cigarettes  ?  Quit date: 05/29/2004  ?  Years since quitting: 17.2  ? Smokeless tobacco: Never  ?Vaping Use  ? Vaping Use: Never used  ?Substance and Sexual Activity  ? Alcohol use: Never  ? Drug use: No  ? Sexual activity: Yes  ?Other Topics Concern  ? Not on file  ?Social History Narrative  ? From [place]  ? Teacher, early years/pre  ? Married  ? ?Social Determinants of Health  ? ?Financial Resource Strain: Not on file  ?Food Insecurity: Not on file  ?Transportation Needs: Not on file  ?Physical Activity: Not on  file  ?Stress: Not on file  ?Social Connections: Not on file  ?Intimate Partner Violence: Not on file  ? ? ?Tobacco Use: Medium Risk  ? Smoking Tobacco Use: Former  ? Smokeless Tobacco Use: Never  ? Passive Exposure: Not on file

## 2021-08-15 NOTE — Discharge Instructions (Addendum)
? ?Gaynelle Arabian, MD ?Total Joint Specialist ?[facility] ?[address] ?[place], [place] [address] ?[phone] ? ?TOTAL KNEE REPLACEMENT POSTOPERATIVE DIRECTIONS ? ? ? ?Knee Rehabilitation, Guidelines Following Surgery  ?Results after knee surgery are often greatly improved when you follow the exercise, range of motion and muscle strengthening exercises prescribed by your doctor. Safety measures are also important to protect the knee from further injury. If any of these exercises cause you to have increased pain or swelling in your knee joint, decrease the amount until you are comfortable again and slowly increase them. If you have problems or questions, call your caregiver or physical therapist for advice.  ? ?BLOOD CLOT PREVENTION ?Take a 10 mg Xarelto once a day for three weeks following surgery.  ?You may resume your vitamins/supplements once you have discontinued the Xarelto. ?Do not take any NSAIDs (Advil, Aleve, Ibuprofen, Meloxicam, etc.) until you have discontinued the Xarelto.  ? ?HOME CARE INSTRUCTIONS  ?Remove items at home which could result in a fall. This includes throw rugs or furniture in walking pathways.  ?ICE to the affected knee as much as tolerated. Icing helps control swelling. If the swelling is well controlled you will be more comfortable and rehab easier. Continue to use ice on the knee for pain and swelling from surgery. You may notice swelling that will progress down to the foot and ankle. This is normal after surgery. Elevate the leg when you are not up walking on it.    ?Continue to use the breathing machine which will help keep your temperature down. It is common for your temperature to cycle up and down following surgery, especially at night when you are not up moving around and exerting yourself. The breathing machine keeps your lungs expanded and your temperature down. ?Do not place pillow under the operative knee, focus on keeping the knee straight  while resting ? ?DIET ?You may resume your previous home diet once you are discharged from the hospital. ? ?DRESSING / WOUND CARE / SHOWERING ?Keep your bulky bandage on for 2 days. On the third post-operative day you may remove the Ace bandage and gauze. There is a waterproof adhesive bandage on your skin which will stay in place until your first follow-up appointment. Once you remove this you will not need to place another bandage ?You may begin showering 3 days following surgery, but do not submerge the incision under water. ? ?ACTIVITY ?For the first 5 days, the key is rest and control of pain and swelling ?Do your home exercises twice a day starting on post-operative day 3. On the days you go to physical therapy, just do the home exercises once that day. ?You should rest, ice and elevate the leg for 50 minutes out of every hour. Get up and walk/stretch for 10 minutes per hour. After 5 days you can increase your activity slowly as tolerated. ?Walk with your walker as instructed. Use the walker until you are comfortable transitioning to a cane. Walk with the cane in the opposite hand of the operative leg. You may discontinue the cane once you are comfortable and walking steadily. ?Avoid periods of inactivity such as sitting longer than an hour when not asleep. This helps prevent blood clots.  ?You may discontinue the knee immobilizer once you are able to perform a straight leg raise while lying down. ?You may resume a sexual relationship in one month or when given the OK by your doctor.  ?You may return to work once  you are cleared by your doctor.  ?Do not drive a car for 6 weeks or until released by your surgeon.  ?Do not drive while taking narcotics. ? ?TED HOSE STOCKINGS ?Wear the elastic stockings on both legs for three weeks following surgery during the day. You may remove them at night for sleeping. ? ?WEIGHT BEARING ?Weight bearing as tolerated with assist device (walker, cane, etc) as directed, use it as  long as suggested by your surgeon or therapist, typically at least 4-6 weeks. ? ?POSTOPERATIVE CONSTIPATION PROTOCOL ?Constipation - defined medically as fewer than three stools per week and severe constipation as less than one stool per week. ? ?One of the most common issues patients have following surgery is constipation.  Even if you have a regular bowel pattern at home, your normal regimen is likely to be disrupted due to multiple reasons following surgery.  Combination of anesthesia, postoperative narcotics, change in appetite and fluid intake all can affect your bowels.  In order to avoid complications following surgery, here are some recommendations in order to help you during your recovery period. ? ?Colace (docusate) - Pick up an over-the-counter form of Colace or another stool softener and take twice a day as long as you are requiring postoperative pain medications.  Take with a full glass of water daily.  If you experience loose stools or diarrhea, hold the colace until you stool forms back up. If your symptoms do not get better within 1 week or if they get worse, check with your doctor. ?Dulcolax (bisacodyl) - Pick up over-the-counter and take as directed by the product packaging as needed to assist with the movement of your bowels.  Take with a full glass of water.  Use this product as needed if not relieved by Colace only.  ?MiraLax (polyethylene glycol) - Pick up over-the-counter to have on hand. MiraLax is a solution that will increase the amount of water in your bowels to assist with bowel movements.  Take as directed and can mix with a glass of water, juice, soda, coffee, or tea. Take if you go more than two days without a movement. Do not use MiraLax more than once per day. Call your doctor if you are still constipated or irregular after using this medication for 7 days in a row. ? ?If you continue to have problems with postoperative constipation, please contact the office for further assistance  and recommendations.  If you experience "the worst abdominal pain ever" or develop nausea or vomiting, please contact the office immediatly for further recommendations for treatment. ? ?ITCHING ?If you experience itching with your medications, try taking only a single pain pill, or even half a pain pill at a time.  You can also use Benadryl over the counter for itching or also to help with sleep.  ? ?MEDICATIONS ?See your medication summary on the ?After Visit Summary? that the nursing staff will review with you prior to discharge.  You may have some home medications which will be placed on hold until you complete the course of blood thinner medication.  It is important for you to complete the blood thinner medication as prescribed by your surgeon.  Continue your approved medications as instructed at time of discharge. ? ?Information on my medicine - XARELTO? (Rivaroxaban) ? ?Why was Xarelto? prescribed for you? ?Xarelto? was prescribed for you to reduce the risk of blood clots forming after orthopedic surgery. The medical term for these abnormal blood clots is venous thromboembolism (VTE). ? ?What do you  need to know about xarelto? ? ?Take your Xarelto? ONCE DAILY at the same time every day. ?You may take it either with or without food. ? ?If you have difficulty swallowing the tablet whole, you may crush it and mix in applesauce just prior to taking your dose. ? ?Take Xarelto? exactly as prescribed by your doctor and DO NOT stop taking Xarelto? without talking to the doctor who prescribed the medication.  Stopping without other VTE prevention medication to take the place of Xarelto? may increase your risk of developing a clot. ? ?After discharge, you should have regular check-up appointments with your healthcare provider that is prescribing your Xarelto?.   ? ?What do you do if you miss a dose? ?If you miss a dose, take it as soon as you remember on the same day then continue your regularly scheduled once daily  regimen the next day. Do not take two doses of Xarelto? on the same day.  ? ?Important Safety Information ?A possible side effect of Xarelto? is bleeding. You should call your healthcare provider right aw

## 2021-08-15 NOTE — Anesthesia Procedure Notes (Signed)
Anesthesia Regional Block: Adductor canal block  ? ?Pre-Anesthetic Checklist: , timeout performed,  Correct Patient, Correct Site, Correct Laterality,  Correct Procedure, Correct Position, site marked,  Risks and benefits discussed,  Pre-op evaluation,  At surgeon's request and post-op pain management ? ?Laterality: Left ? ?Prep: Maximum Sterile Barrier Precautions used, chloraprep     ?  ?Needles:  ?Injection technique: Single-shot ? ?Needle Type: Echogenic Stimulator Needle   ? ? ?Needle Length: 9cm  ?Needle Gauge: 21  ? ? ? ?Additional Needles: ? ? ?Procedures:,,,, ultrasound used (permanent image in chart),,    ?Narrative:  ?Start time: 08/15/2021 8:22 AM ?End time: 08/15/2021 8:32 AM ?Injection made incrementally with aspirations every 5 mL. ? ?Performed by: Personally  ?Anesthesiologist: Roderic Palau, MD ? ?Additional Notes: ?  ? ? ? ? ?

## 2021-08-15 NOTE — Evaluation (Signed)
Physical Therapy Evaluation ?Patient Details ?Name: Glenn Lyons ?MRN: [id number] ?DOB: [birth year] ?Today's Date: 08/15/2021 ? ?History of Present Illness ? 66 YO male S/P LTKA 08/15/21. PMH: PPM, DM, Neropathy.  ?Clinical Impression ? Patient ambulated 42 ' using the Rw. Patient did feel a bit weak ans woozy after ambulation distance. ? Patient should progress to Dc home tomorrow. ?Pt admitted with above diagnosis.  Pt currently with functional limitations due to the deficits listed below (see PT Problem List). Pt will benefit from skilled PT to increase their independence and safety with mobility to allow discharge to the venue listed below.   ?  ?   ? ?Recommendations for follow up therapy are one component of a multi-disciplinary discharge planning process, led by the attending physician.  Recommendations may be updated based on patient status, additional functional criteria and insurance authorization. ? ?Follow Up Recommendations Follow physician's recommendations for discharge plan and follow up therapies ? ?  ?Assistance Recommended at Discharge Intermittent Supervision/Assistance  ?Patient can return home with the following ? A little help with walking and/or transfers;Assist for transportation;A little help with bathing/dressing/bathroom;Help with stairs or ramp for entrance ? ?  ?Equipment Recommendations None recommended by PT  ?Recommendations for Other Services ?    ?  ?Functional Status Assessment Patient has had a recent decline in their functional status and demonstrates the ability to make significant improvements in function in a reasonable and predictable amount of time.  ? ?  ?Precautions / Restrictions Precautions ?Precautions: Knee ?Required Braces or Orthoses: Knee Immobilizer - Right ?Knee Immobilizer - Right: Discontinue once straight leg raise with < 10 degree lag ?Restrictions ?Weight Bearing Restrictions: No  ? ?  ? ?Mobility ? Bed Mobility ?Overal bed mobility: Needs Assistance ?Bed  Mobility: Supine to Sit ?  ?  ?Supine to sit: Min assist, HOB elevated ?  ?  ?General bed mobility comments: support left lweg to edge ?  ? ?Transfers ?Overall transfer level: Needs assistance ?Equipment used: Rolling walker (2 wheels) ?Transfers: Sit to/from Stand ?Sit to Stand: Min assist ?  ?  ?  ?  ?  ?General transfer comment: cues for hand nd left leg position ?  ? ?Ambulation/Gait ?Ambulation/Gait assistance: Min assist, +2 safety/equipment ?Gait Distance (Feet): 40 Feet ?Assistive device: Rolling walker (2 wheels) ?Gait Pattern/deviations: Step-to pattern, Step-through pattern ?Gait velocity: decr ?  ?  ?General Gait Details: slow and steady, cues for sequence, patient feldizziness at end of distnace ? ?Stairs ?  ?  ?  ?  ?  ? ?Wheelchair Mobility ?  ? ?Modified Rankin (Stroke Patients Only) ?  ? ?  ? ?Balance Overall balance assessment: Needs assistance ?Sitting-balance support: No upper extremity supported, Bilateral upper extremity supported ?Sitting balance-Leahy Scale: Good ?  ?  ?Standing balance support: During functional activity, Bilateral upper extremity supported, Reliant on assistive device for balance ?Standing balance-Leahy Scale: Fair ?  ?  ?  ?  ?  ?  ?  ?  ?  ?  ?  ?  ?   ? ? ? ?Pertinent Vitals/Pain Pain Assessment ?Pain Assessment: 0-10 ?Pain Score: 7  ?Pain Location: left knee ?Pain Descriptors / Indicators: Aching, Discomfort ?Pain Intervention(s): Premedicated before session, Monitored during session, Ice applied  ? ? ?Home Living Family/patient expects to be discharged to:: Private residence ?Living Arrangements: Spouse/significant other ?Available Help at Discharge: Family ?Type of Home: House ?Home Access: Stairs to enter ?Entrance Stairs-Rails: None ?Entrance Stairs-Number of Steps: 1-2 ?  ?Home Layout:  One level ?Home Equipment: Conservation officer, nature (2 wheels);Rollator (4 wheels) (upright) ?   ?  ?Prior Function Prior Level of Function : Independent/Modified Independent ?  ?  ?  ?  ?  ?   ?  ?  ?  ? ? ?Hand Dominance  ?   ? ?  ?Extremity/Trunk Assessment  ? Upper Extremity Assessment ?Upper Extremity Assessment: Overall WFL for tasks assessed ?  ? ?Lower Extremity Assessment ?Lower Extremity Assessment: LLE deficits/detail ?LLE Deficits / Details: very small  SLR, Knee flexed to ` 30 ?  ? ?Cervical / Trunk Assessment ?Cervical / Trunk Assessment: Normal  ?Communication  ? Communication: No difficulties  ?Cognition Arousal/Alertness: Awake/alert ?Behavior During Therapy: [facility] for tasks assessed/performed ?Overall Cognitive Status: Within Functional Limits for tasks assessed ?  ?  ?  ?  ?  ?  ?  ?  ?  ?  ?  ?  ?  ?  ?  ?  ?  ?  ?  ? ?  ?General Comments   ? ?  ?Exercises Total Joint Exercises ?Ankle Circles/Pumps: AROM, Both, 10 reps ?Quad Sets: AROM, Both, 10 reps  ? ?Assessment/Plan  ?  ?PT Assessment Patient needs continued PT services  ?PT Problem List Decreased strength;Decreased mobility;Decreased range of motion;Decreased knowledge of precautions;Pain ? ?   ?  ?PT Treatment Interventions DME instruction;Therapeutic activities;Gait training;Therapeutic exercise;Patient/family education;Stair training;Functional mobility training   ? ?PT Goals (Current goals can be found in the Care Plan section)  ?Acute Rehab PT Goals ?Patient Stated Goal: to go home ?PT Goal Formulation: With patient/family ?Time For Goal Achievement: 08/22/21 ?Potential to Achieve Goals: Good ? ?  ?Frequency 7X/week ?  ? ? ?Co-evaluation   ?  ?  ?  ?  ? ? ?  ?AM-PAC PT "6 Clicks" Mobility  ?Outcome Measure Help needed turning from your back to your side while in a flat bed without using bedrails?: A Little ?Help needed moving from lying on your back to sitting on the side of a flat bed without using bedrails?: A Little ?Help needed moving to and from a bed to a chair (including a wheelchair)?: A Little ?Help needed standing up from a chair using your arms (e.g., wheelchair or bedside chair)?: A Little ?Help needed to walk in  hospital room?: A Little ?Help needed climbing 3-5 steps with a railing? : A Lot ?6 Click Score: 17 ? ?  ?End of Session Equipment Utilized During Treatment: Gait belt;Left knee immobilizer ?Activity Tolerance: Patient tolerated treatment well ?Patient left: in chair;with call bell/phone within reach;with chair alarm set;with family/visitor present ?Nurse Communication: Mobility status ?PT Visit Diagnosis: Unsteadiness on feet (R26.81);Difficulty in walking, not elsewhere classified (R26.2);Pain ?Pain - Right/Left: Left ?Pain - part of body: Knee ?  ? ?Time: 9373-4287 ?PT Time Calculation (min) (ACUTE ONLY): 24 min ? ? ?Charges:   PT Evaluation ?$PT Eval Low Complexity: 1 Low ?PT Treatments ?$Gait Training: 8-22 mins ?  ?   ? ? ?Tresa Endo PT ?Acute Rehabilitation Services ?Pager [phone] ?Office [phone] ? ? ?Claretha Cooper ?08/15/2021, 5:20 PM ? ?

## 2021-08-15 NOTE — Transfer of Care (Signed)
Immediate Anesthesia Transfer of Care Note ? ?Patient: Glenn Lyons ? ?Procedure(s) Performed: TOTAL KNEE ARTHROPLASTY (Left: Knee) ? ?Patient Location: PACU ? ?Anesthesia Type:Regional and Spinal ? ?Level of Consciousness: sedated ? ?Airway & Oxygen Therapy: Patient Spontanous Breathing and Patient connected to face mask oxygen ? ?Post-op Assessment: Report given to RN and Post -op Vital signs reviewed and stable ? ?Post vital signs: Reviewed and stable ? ?Last Vitals:  ?Vitals Value Taken Time  ?BP    ?Temp    ?Pulse 60 08/15/21 1058  ?Resp 11 08/15/21 1058  ?SpO2 98 % 08/15/21 1058  ?Vitals shown include unvalidated device data. ? ?Last Pain:  ?Vitals:  ? 08/15/21 0830  ?TempSrc:   ?PainSc: 6   ?   ? ?Patients Stated Pain Goal: 6 (08/15/21 0830) ? ?Complications: No notable events documented. ?

## 2021-08-15 NOTE — Interval H&P Note (Signed)
History and Physical Interval Note: ? ?08/15/2021 ?7:14 AM ? ?Glenn Lyons  has presented today for surgery, with the diagnosis of left knee osteoarthritis.  The various methods of treatment have been discussed with the patient and family. After consideration of risks, benefits and other options for treatment, the patient has consented to  Procedure(s): ?TOTAL KNEE ARTHROPLASTY (Left) as a surgical intervention.  The patient's history has been reviewed, patient examined, no change in status, stable for surgery.  I have reviewed the patient's chart and labs.  Questions were answered to the patient's satisfaction.   ? ? ?Glenn Lyons ? ? ?

## 2021-08-15 NOTE — Anesthesia Procedure Notes (Addendum)
Spinal ? ?Patient location during procedure: OR ?Start time: 08/15/2021 9:14 AM ?End time: 08/15/2021 9:17 AM ?Reason for block: surgical anesthesia ?Staffing ?Performed: resident/CRNA  ?Anesthesiologist: Roderic Palau, MD ?Resident/CRNA: Lind Covert, CRNA ?Preanesthetic Checklist ?Completed: patient identified, IV checked, site marked, risks and benefits discussed, surgical consent, monitors and equipment checked, pre-op evaluation and timeout performed ?Spinal Block ?Patient position: sitting ?Prep: DuraPrep ?Patient monitoring: heart rate, cardiac monitor, continuous pulse ox and blood pressure ?Approach: midline ?Location: L3-4 ?Injection technique: single-shot ?Needle ?Needle type: Pencan  ?Needle gauge: 24 G ?Needle length: 10 cm ?Needle insertion depth: 8 cm ?Assessment ?Sensory level: T6 ?Events: CSF return ?Additional Notes ?Timeout performed. Patient in sitting position L 3-4 identified SAB without difficulty. Patient to supine position. ? ? ? ?

## 2021-08-15 NOTE — Anesthesia Postprocedure Evaluation (Signed)
Anesthesia Post Note ? ?Patient: Jervis Trapani Diers ? ?Procedure(s) Performed: TOTAL KNEE ARTHROPLASTY (Left: Knee) ? ?  ? ?Patient location during evaluation: PACU ?Anesthesia Type: Spinal and Regional ?Level of consciousness: oriented and awake and alert ?Pain management: pain level controlled ?Vital Signs Assessment: post-procedure vital signs reviewed and stable ?Respiratory status: spontaneous breathing and respiratory function stable ?Cardiovascular status: blood pressure returned to baseline and stable ?Postop Assessment: no headache, no backache, no apparent nausea or vomiting, spinal receding and patient able to bend at knees ?Anesthetic complications: no ? ? ?No notable events documented. ? ?Last Vitals:  ?Vitals:  ? 08/15/21 1145 08/15/21 1209  ?BP: (!) 147/84 (!) 144/86  ?Pulse: 60 (!) 59  ?Resp: 11 16  ?Temp: 36.5 ?C 36.4 ?C  ?SpO2: 98% 99%  ?  ?Last Pain:  ?Vitals:  ? 08/15/21 1209  ?TempSrc: Oral  ?PainSc:   ? ? ?  ?  ?  ?  ?  ?  ? ?Marisella Puccio,W. EDMOND ? ? ? ? ?

## 2021-08-16 ENCOUNTER — Encounter (HOSPITAL_COMMUNITY): Payer: Self-pay | Admitting: Orthopedic Surgery

## 2021-08-16 DIAGNOSIS — M1712 Unilateral primary osteoarthritis, left knee: Secondary | ICD-10-CM | POA: Diagnosis not present

## 2021-08-16 LAB — BASIC METABOLIC PANEL
Anion gap: 6 (ref 5–15)
BUN: 14 mg/dL (ref 8–23)
CO2: 25 mmol/L (ref 22–32)
Calcium: 9.2 mg/dL (ref 8.9–10.3)
Chloride: 103 mmol/L (ref 98–111)
Creatinine, Ser: 0.79 mg/dL (ref 0.61–1.24)
GFR, Estimated: >60 mL/min (ref >60)
Glucose, Bld: 180 mg/dL — ABNORMAL HIGH (ref 70–99)
Potassium: 4.9 mmol/L (ref 3.5–5.1)
Sodium: 134 mmol/L — ABNORMAL LOW (ref 135–145)

## 2021-08-16 LAB — CBC
HCT: 44.7 % (ref 39.0–52.0)
Hemoglobin: 14.9 g/dL (ref 13.0–17.0)
MCH: 31.3 pg (ref 26.0–34.0)
MCHC: 33.3 g/dL (ref 30.0–36.0)
MCV: 93.9 fL (ref 80.0–100.0)
Platelets: 209 10*3/uL (ref 150–400)
RBC: 4.76 MIL/uL (ref 4.22–5.81)
RDW: 13.6 % (ref 11.5–15.5)
WBC: 19 10*3/uL — ABNORMAL HIGH (ref 4.0–10.5)
nRBC: 0 % (ref 0.0–0.2)

## 2021-08-16 LAB — GLUCOSE, CAPILLARY
Glucose-Capillary: 185 mg/dL — ABNORMAL HIGH (ref 70–99)
Glucose-Capillary: 144 mg/dL — ABNORMAL HIGH (ref 70–99)

## 2021-08-16 MED ORDER — RIVAROXABAN 10 MG PO TABS: 10.0000 mg | ORAL_TABLET | Freq: Every day | ORAL | 0 refills | Status: DC

## 2021-08-16 MED ORDER — METHOCARBAMOL 500 MG PO TABS: 500.0000 mg | ORAL_TABLET | Freq: Four times a day (QID) | ORAL | 0 refills | Status: DC | PRN

## 2021-08-16 MED ORDER — GABAPENTIN 300 MG PO CAPS: ORAL_CAPSULE | ORAL | 0 refills | Status: DC

## 2021-08-16 MED ORDER — OXYCODONE HCL 5 MG PO TABS: 5.0000 mg | ORAL_TABLET | Freq: Four times a day (QID) | ORAL | 0 refills | Status: DC | PRN

## 2021-08-16 MED ORDER — TRAMADOL HCL 50 MG PO TABS
50.0000 mg | ORAL_TABLET | Freq: Four times a day (QID) | ORAL | 0 refills | Status: DC | PRN
Start: 2021-08-16 — End: 2022-01-12

## 2021-08-16 NOTE — Plan of Care (Signed)
Pt ready to DC home with wife. 

## 2021-08-16 NOTE — Plan of Care (Signed)
  Problem: Education: Goal: Knowledge of the prescribed therapeutic regimen will improve Outcome: Progressing   Problem: Activity: Goal: Range of joint motion will improve Outcome: Progressing   Problem: Pain Management: Goal: Pain level will decrease with appropriate interventions Outcome: Progressing   Problem: Safety: Goal: Ability to remain free from injury will improve Outcome: Progressing   

## 2021-08-16 NOTE — TOC Transition Note (Signed)
Transition of Care (TOC) - CM/SW Discharge Note ? ?Patient Details  ?Name: Nehemias Sauceda Skillern ?MRN: [id number] ?Date of Birth: [birth year] ? ?Transition of Care (TOC) CM/SW Contact:  ?Sherie Don, LCSW ?Phone Number: ?08/16/2021, 10:29 AM ? ?Clinical Narrative: Patient is expected to discharge home after working with PT. CSW spoke with patient to confirm discharge plan and needs. Patient will go home with OPPT at [facility]. Patient has a rollator he private paid for at home, but will need a bariatric rolling walker. [employer] delivered walker to patient's room. TOC signing off. ? ?Final next level of care: OP Rehab ?Barriers to Discharge: No Barriers Identified ? ?Patient Goals and CMS Choice ?Patient states their goals for this hospitalization and ongoing recovery are:: Discharge home with OPPT at [facility] ?CMS Medicare.gov Compare Post Acute Care list provided to:: Patient ?Choice offered to / list presented to : Patient ? ?Discharge Plan and Services        ?DME Arranged: Walker rolling (Bariatric walker) ?DME Agency: [employer] ?Date DME Agency Contacted: 08/16/21 ?Representative spoke with at DME Agency: Wells Guiles ? ?Readmission Risk Interventions ?No flowsheet data found. ? ?

## 2021-08-16 NOTE — Progress Notes (Signed)
Physical Therapy Treatment ?Patient Details ?Name: Glenn Lyons ?MRN: [id number] ?DOB: May 01, 1956 ?Today's Date: 08/16/2021 ? ? ?History of Present Illness 65 YO male S/P LTKA 08/15/21. PMH: PPM, DM, Neropathy. ? ?  ?PT Comments  ? ? The patient is making progress, has met PT goals for Dc. ?   ?Recommendations for follow up therapy are one component of a multi-disciplinary discharge planning process, led by the attending physician.  Recommendations may be updated based on patient status, additional functional criteria and insurance authorization. ? ?Follow Up Recommendations ? Follow physician's recommendations for discharge plan and follow up therapies ?  ?  ?Assistance Recommended at Discharge Intermittent Supervision/Assistance  ?Patient can return home with the following A little help with walking and/or transfers;Assist for transportation;A little help with bathing/dressing/bathroom;Help with stairs or ramp for entrance ?  ?Equipment Recommendations ? None recommended by PT  ?  ?Recommendations for Other Services   ? ? ?  ?Precautions / Restrictions Precautions ?Precautions: Knee ?Required Braces or Orthoses: Knee Immobilizer - Right ?Knee Immobilizer - Right: Discontinue once straight leg raise with < 10 degree lag  ?  ? ?Mobility ? Bed Mobility ?  ?Bed Mobility: Supine to Sit ?  ?  ?Supine to sit: Supervision ?  ?  ?  ?  ? ?Transfers ?Overall transfer level: Needs assistance ?Equipment used: Rolling walker (2 wheels) ?Transfers: Sit to/from Stand ?Sit to Stand: Min guard ?  ?  ?  ?  ?  ?General transfer comment: cues for hand nd left leg position ?  ? ?Ambulation/Gait ?Ambulation/Gait assistance: Supervision ?Gait Distance (Feet): 120 Feet ?Assistive device: Rolling walker (2 wheels) ?Gait Pattern/deviations: Step-to pattern, Step-through pattern ?  ?  ?  ?General Gait Details: slow and steady, cues for sequence, patient feldizziness at end of distnace ? ? ?Stairs ?Stairs: Yes ?Stairs assistance: Min  assist ?Stair Management: No rails, Step to pattern, Forwards, Backwards, With walker ?Number of Stairs: 1 ?General stair comments: practiced both forward and backward, provided handout, family not present ? ? ?Wheelchair Mobility ?  ? ?Modified Rankin (Stroke Patients Only) ?  ? ? ?  ?Balance Overall balance assessment: Needs assistance ?  ?Sitting balance-Leahy Scale: Good ?  ?  ?Standing balance support: During functional activity, Bilateral upper extremity supported, Reliant on assistive device for balance ?Standing balance-Leahy Scale: Good ?  ?  ?  ?  ?  ?  ?  ?  ?  ?  ?  ?  ?  ? ?  ?Cognition Arousal/Alertness: Awake/alert ?  ?  ?  ?  ?  ?  ?  ?  ?  ?  ?  ?  ?  ?  ?  ?  ?  ?  ?  ?  ?  ? ?  ?Exercises Total Joint Exercises ?Ankle Circles/Pumps: AROM, Both, 10 reps ?Quad Sets: AROM, Both, 10 reps ?Knee Flexion: AROM, Left, 5 reps ? ?  ?General Comments   ?  ?  ? ?Pertinent Vitals/Pain Pain Assessment ?Pain Score: 5  ?Pain Location: left knee ?Pain Descriptors / Indicators: Aching, Discomfort ?Pain Intervention(s): Monitored during session, Premedicated before session, Ice applied  ? ? ?Home Living   ?  ?  ?  ?  ?  ?  ?  ?  ?  ?   ?  ?Prior Function    ?  ?  ?   ? ?PT Goals (current goals can now be found in the care plan section) Progress towards PT goals: Progressing  toward goals ? ?  ?Frequency ? ? ? 7X/week ? ? ? ?  ?PT Plan Current plan remains appropriate  ? ? ?Co-evaluation   ?  ?  ?  ?  ? ?  ?AM-PAC PT "6 Clicks" Mobility   ?Outcome Measure ? Help needed turning from your back to your side while in a flat bed without using bedrails?: A Little ?Help needed moving from lying on your back to sitting on the side of a flat bed without using bedrails?: A Little ?Help needed moving to and from a bed to a chair (including a wheelchair)?: A Little ?Help needed standing up from a chair using your arms (e.g., wheelchair or bedside chair)?: A Little ?Help needed to walk in hospital room?: A Little ?Help needed  climbing 3-5 steps with a railing? : A Little ?6 Click Score: 18 ? ?  ?End of Session Equipment Utilized During Treatment: Gait belt;Left knee immobilizer ?Activity Tolerance: Patient tolerated treatment well ?Patient left: in chair;with call bell/phone within reach ?Nurse Communication: Mobility status ?PT Visit Diagnosis: Unsteadiness on feet (R26.81);Difficulty in walking, not elsewhere classified (R26.2);Pain ?Pain - Right/Left: Left ?Pain - part of body: Knee ?  ? ? ?Time: 1103-1140 ?PT Time Calculation (min) (ACUTE ONLY): 37 min ? ?Charges:  $Gait Training: 8-22 mins ?$Therapeutic Exercise: 8-22 mins          ?          ? ?Tresa Endo PT ?Acute Rehabilitation Services ?Pager [phone] ?Office [phone] ? ? ? ?Claretha Cooper ?08/16/2021, 1:19 PM ? ?

## 2021-08-16 NOTE — Progress Notes (Signed)
? ?Subjective: ?1 Day Post-Op Procedure(s) (LRB): ?TOTAL KNEE ARTHROPLASTY (Left) ?Patient reports pain as 4 on 0-10 scale.   ?Patient seen in rounds by Dr. Wynelle Link. ?Patient is well, and has had no acute complaints or problems.Denies SOB, chest pain, or calf pain. No acute overnight events. Ambulated 40 feet with therapy yesterday. Will continue therapy today.  ? ? ? ?Objective: ?Vital signs in last 24 hours: ?Temp:  [97.3 ?F (36.3 ?C)-98.7 ?F (37.1 ?C)] 97.7 ?F (36.5 ?C) (03/21 0551) ?Pulse Rate:  [59-74] 60 (03/21 0551) ?Resp:  [8-22] 16 (03/21 0551) ?BP: (111-177)/(61-89) 111/61 (03/21 0551) ?SpO2:  [94 %-100 %] 95 % (03/21 0551) ? ?Intake/Output from previous day: ? ?Intake/Output Summary (Last 24 hours) at 08/16/2021 0750 ?Last data filed at 08/16/2021 0500 ?Gross per 24 hour  ?Intake 2359.9 ml  ?Output 4045 ml  ?Net -1685.1 ml  ?  ? ?Intake/Output this shift: ?No intake/output data recorded. ? ?Labs: ?Recent Labs  ?  08/16/21 ?0316  ?HGB 14.9  ? ?Recent Labs  ?  08/16/21 ?0316  ?WBC 19.0*  ?RBC 4.76  ?HCT 44.7  ?PLT 209  ? ?Recent Labs  ?  08/16/21 ?0316  ?NA 134*  ?K 4.9  ?CL 103  ?CO2 25  ?BUN 14  ?CREATININE 0.79  ?GLUCOSE 180*  ?CALCIUM 9.2  ? ?No results for input(s): LABPT, INR in the last 72 hours. ? ?Exam: ?General - Patient is Alert and Oriented ?Extremity - Neurologically intact ?Neurovascular intact ?Intact pulses distally ?Dorsiflexion/Plantar flexion intact ?Dressing - dressing C/D/I ?Motor Function - intact, moving foot and toes well on exam.  ? ?Past Medical History:  ?Diagnosis Date  ? Achalasia   ? with prev eval at [facility].  No intervention as of 2012  ? Arthritis   ? Aspiration pneumonia ([place])   ? Backache, unspecified   ? Cardiac pacemaker St. Jude   ? ERI 2008  ? Depressive disorder, not elsewhere classified   ? Diabetes ([place])   ? Morbid obesity ([place])   ? Obstructive sleep apnea   ? biapap settign 25-22  ? Presence of permanent cardiac pacemaker   ? St. Jude- Dr. Caryl Comes follows -device Check  07-06-14  ? Sinus bradycardia /pauses   ? Stricture and stenosis of esophagus   ? ? ?Assessment/Plan: ?1 Day Post-Op Procedure(s) (LRB): ?TOTAL KNEE ARTHROPLASTY (Left) ?Principal Problem: ?  Degenerative arthritis of left knee ?Active Problems: ?  Primary osteoarthritis of left knee ? ?Estimated body mass index is 40.82 kg/m? as calculated from the following: ?  Height as of this encounter: 6' (1.829 m). ?  Weight as of this encounter: 136.5 kg. ?Up with therapy ? ? ?Patient's anticipated LOS is less than 2 midnights, meeting these requirements: ?- Younger than 38 ?- Lives within 1 hour of care ?- Has a competent adult at home to recover with post-op recover ?- NO history of ? - Chronic pain requiring opiods ? - Diabetes ? - Coronary Artery Disease ? - Heart failure ? - Heart attack ? - Stroke ? - DVT/VTE ? - Cardiac arrhythmia ? - Respiratory Failure/COPD ? - Renal failure ? - Anemia ? - Advanced Liver disease ? ?  ? ?DVT Prophylaxis - Xarelto and TED hose ?Weight bearing as tolerated. ?Continue therapy. ? ?Plan is to go Home after hospital stay. ? ?Plan for two sessions with PT this morning, and if meeting goals, will plan for discharge this afternoon.  ? ?Patient to follow up in two weeks with Dr. Wynelle Link in  clinic.  ? ?The PDMP database was reviewed today prior to any opioid medications being prescribed to this patient.. ? ? ?Fenton Foy, MBA, PA-C ?Orthopedic Surgery ?08/16/2021, 7:50 AM  ?

## 2021-08-18 ENCOUNTER — Other Ambulatory Visit: Payer: Self-pay

## 2021-08-18 ENCOUNTER — Encounter: Payer: Self-pay | Admitting: Physical Therapy

## 2021-08-18 ENCOUNTER — Ambulatory Visit: Payer: BC Managed Care – PPO | Attending: Orthopedic Surgery | Admitting: Physical Therapy

## 2021-08-18 DIAGNOSIS — M25662 Stiffness of left knee, not elsewhere classified: Secondary | ICD-10-CM | POA: Insufficient documentation

## 2021-08-18 DIAGNOSIS — R600 Localized edema: Secondary | ICD-10-CM | POA: Diagnosis present

## 2021-08-18 DIAGNOSIS — M25562 Pain in left knee: Secondary | ICD-10-CM | POA: Insufficient documentation

## 2021-08-18 DIAGNOSIS — R262 Difficulty in walking, not elsewhere classified: Secondary | ICD-10-CM | POA: Diagnosis present

## 2021-08-18 NOTE — Patient Instructions (Signed)
?  Access Code: [email or web address] ?URL: [email or web address] ?Date: 08/18/2021 ?Prepared by: Lum Babe ? ?Exercises ?- Supine Short Arc Quad  - 2 x daily - 7 x weekly - 3 sets - 10 reps - 3 hold ?- Seated Knee Flexion Stretch  - 2 x daily - 7 x weekly - 1 sets - 10 reps - 30 hold ?- Seated Knee Extension Stretch with Chair  - 2 x daily - 7 x weekly - 1 sets - 10 reps - 30 hold ?- Seated Ankle Pumps  - 2 x daily - 7 x weekly - 2 sets - 10 reps - 3 hold ? ?

## 2021-08-18 NOTE — Therapy (Signed)
[facility] ?[address] ?[place], [place], [address] ?Phone: [phone]   Fax:  [phone] ? ?Physical Therapy Evaluation ? ?Patient Details  ?Name: Glenn Lyons ?MRN: [id number] ?Date of Birth: [birth year] ?Referring Provider (PT): Aluisio ? ? ?Encounter Date: 08/18/2021 ? ? PT End of Session - 08/18/21 1441   ? ? Visit Number 1   ? Date for PT Re-Evaluation 11/18/21   ? Authorization Type BCBS   ? PT Start Time 1355   ? PT Stop Time 9833   ? PT Time Calculation (min) 50 min   ? Activity Tolerance Patient tolerated treatment well   ? Behavior During Therapy [facility] for tasks assessed/performed   ? ?  ?  ? ?  ? ? ?Past Medical History:  ?Diagnosis Date  ? Achalasia   ? with prev eval at [facility].  No intervention as of 2012  ? Arthritis   ? Aspiration pneumonia ([place])   ? Backache, unspecified   ? Cardiac pacemaker St. Jude   ? ERI 2008  ? Depressive disorder, not elsewhere classified   ? Diabetes ([place])   ? Morbid obesity ([place])   ? Obstructive sleep apnea   ? biapap settign 25-22  ? Presence of permanent cardiac pacemaker   ? St. Jude- Dr. Caryl Comes follows -device Check 07-06-14  ? Sinus bradycardia /pauses   ? Stricture and stenosis of esophagus   ? ? ?Past Surgical History:  ?Procedure Laterality Date  ? BALLOON DILATION N/A 10/06/2015  ? Procedure: BALLOON DILATION;  Surgeon: Mauri Pole, MD;  Location: [place] [facility];  Service: Endoscopy;  Laterality: N/A;  ? BIOPSY  05/06/2018  ? Procedure: BIOPSY;  Surgeon: Irving Copas., MD;  Location: [place];  Service: Gastroenterology;;  ? COLONOSCOPY W/ POLYPECTOMY  11/01/2010  ? COLONOSCOPY WITH PROPOFOL N/A 10/05/2014  ? Procedure: COLONOSCOPY WITH PROPOFOL;  Surgeon: Gatha Mayer, MD;  Location: [facility];  Service: Endoscopy;  Laterality: N/A;  ? COLONOSCOPY WITH PROPOFOL N/A 05/06/2018  ? Procedure: COLONOSCOPY WITH PROPOFOL;  Surgeon: Mansouraty, Telford Nab., MD;  Location: [place];  Service:  Gastroenterology;  Laterality: N/A;  ? ESOPHAGEAL DILATION    ? ESOPHAGEAL MANOMETRY N/A 08/23/2015  ? Procedure: ESOPHAGEAL MANOMETRY (EM);  Surgeon: Mauri Pole, MD;  Location: [facility];  Service: Endoscopy;  Laterality: N/A;  ? ESOPHAGOGASTRODUODENOSCOPY N/A 10/09/2014  ? Procedure: ESOPHAGOGASTRODUODENOSCOPY (EGD);  Surgeon: Gatha Mayer, MD;  Location: [facility];  Service: Endoscopy;  Laterality: N/A;  ? ESOPHAGOGASTRODUODENOSCOPY (EGD) WITH PROPOFOL N/A 08/23/2015  ? Procedure: ESOPHAGOGASTRODUODENOSCOPY (EGD) WITH PROPOFOL;  Surgeon: Mauri Pole, MD;  [facility];  Service: Endoscopy;  Laterality: N/A;  ? ESOPHAGOGASTRODUODENOSCOPY (EGD) WITH PROPOFOL N/A 10/06/2015  ? Procedure: ESOPHAGOGASTRODUODENOSCOPY (EGD) WITH PROPOFOL;  Surgeon: Mauri Pole, MD;  Location: [place] [facility];  Service: Endoscopy;  Laterality: N/A;  Rigiflex ballon size 6m-35mm size 45 minute proc,need Fluro ?Gastografin esophagram 2 hrs post EGD ?  ? GASTRIC ROUX-EN-Y N/A 04/05/2020  ? Procedure: LAPAROSCOPIC ROUX-EN-Y GASTRIC BYPASS WITH UPPER ENDOSCOPY;  Surgeon: WGreer Pickerel MD;  [facility];  Service: General;  Laterality: N/A;  ? PACEMAKER INSERTION    ? POLYPECTOMY  05/06/2018  ? Procedure: POLYPECTOMY;  Surgeon: MRush LandmarkGTelford Nab, MD;  [place]  Service: Gastroenterology;;  ? [place]/A 05/01/2018  ? Procedure: PPM GENERATOR CHANGEOUT;  Surgeon: KDeboraha Sprang MD;  [place];  Service: Cardiovascular;  Laterality: N/A;  ?  TOTAL KNEE ARTHROPLASTY Left 08/15/2021  ? Procedure: TOTAL KNEE ARTHROPLASTY;  Surgeon: Gaynelle Arabian, MD;  [facility];  Service: Orthopedics;  Laterality: Left;  ? ? ?There were no vitals filed for this visit. ? ? ? Subjective Assessment - 08/18/21 1404   ? ? Subjective Patient underwent a left TKA on 08/15/21.  Reports years of left knee pain, reports that he was using a cane prior.  Also using a upright  walker.  Had one night in the hospital   ? Pertinent History pacemaker   ? Limitations Lifting;Standing;Walking;House hold activities   ? Patient Stated Goals walk, have good ROM, get back to some work and travel   ? Currently in Pain? Yes   ? Pain Score 3    ? Pain Location Knee   ? Pain Orientation Left   ? Pain Descriptors / Indicators Aching;Sore   ? Pain Type Acute pain;Surgical pain   ? Aggravating Factors  pain up to 10/10, bending, trying to get up   ? Pain Relieving Factors ice pain meds, elevates pain at best 3/10   ? Effect of Pain on Daily Activities limits everything   ? ?  ?  ? ?  ? ? ? ? ? [facility] PT Assessment - 08/18/21 0001   ? ?  ? Assessment  ? Medical Diagnosis s/p left TKA   ? Referring Provider (PT) Aluisio   ? Onset Date/Surgical Date 08/15/21   ? Prior Therapy no   ?  ? Precautions  ? Precautions ICD/Pacemaker   ?  ? Balance Screen  ? Has the patient fallen in the past 6 months No   ? Has the patient had a decrease in activity level because of a fear of falling?  No   ? Is the patient reluctant to leave their home because of a fear of falling?  No   ?  ? Home Environment  ? Additional Comments one step into the home   ?  ? Prior Function  ? Level of Independence Independent with community mobility with device   ? Vocation Full time employment   ? Vocation Requirements travels, mostly sedentary   ? Leisure no exercise   ?  ? Observation/Other Assessments-Edema   ? Edema Circumferential   ?  ? Circumferential Edema  ? Circumferential - Right 54cm mid patella   ? Circumferential - Left  48 cm   ?  ? ROM / Strength  ? AROM / PROM / Strength AROM;PROM;Strength   ?  ? AROM  ? AROM Assessment Site Knee   ? Right/Left Knee Left   ? Left Knee Extension 40   ? Left Knee Flexion 50   ?  ? PROM  ? PROM Assessment Site Knee   ? Right/Left Knee Left   ? Left Knee Extension 10   ? Left Knee Flexion 60   ?  ? Ambulation/Gait  ? Gait Comments with FWW, very low, he was stooped over, I adjusted, step to patter,  very stiff   ?  ? Standardized Balance Assessment  ? Standardized Balance Assessment Timed Up and Go Test   ?  ? Timed Up and Go Test  ? Normal TUG (seconds) 62   ? TUG Comments FWW   ? ?  ?  ? ?  ? ? ? ? ? ? ? ? ? ? ? ? ? ?Objective measurements completed on examination: See above findings.  ? ? ? ? ? ? ? ? ? ? ? ? ? ? ? ?  PT Short Term Goals - 08/18/21 1511   ? ?  ? PT SHORT TERM GOAL #1  ? Title indepednent with initial HEP   ? Time 2   ? Period Weeks   ? Status New   ? ?  ?  ? ?  ? ? ? ? PT Long Term Goals - 08/18/21 1512   ? ?  ? PT LONG TERM GOAL #1  ? Title understand RICE   ? Time 12   ? Period Weeks   ? Status New   ?  ? PT LONG TERM GOAL #2  ? Title increase AROM of the left knee to 10-115 degrees   ? Time 12   ? Period Weeks   ? Status New   ?  ? PT LONG TERM GOAL #3  ? Title decrease TUG time to 25 seconds   ? Time 12   ? Period Weeks   ? Status New   ?  ? PT LONG TERM GOAL #4  ? Title walk with SPC or LRAD x 300 feet   ? Time 12   ? Period Weeks   ? Status New   ?  ? PT LONG TERM GOAL #5  ? Title sit to stand without use of the UE's   ? Time 12   ? Period Weeks   ? Status New   ? ?  ?  ? ?  ? ? ? ? ? ? ? ? ? Plan - 08/18/21 1442   ? ? Clinical Impression Statement Patient reports years of knee pain, has a pacemaker and had gastric bypass two years ago has lost over 100 punds.  He underwent left TKA on 08/15/21, one night hospital stay.  Has very poor and slow gait, has very poor AROM 40-50 degrees flexion.  He has pain but seems to be tolerating it well.  TUG was 60 seconds   ? Stability/Clinical Decision Making Evolving/Moderate complexity   ? Clinical Decision Making Low   ? Rehab Potential Good   ? PT Frequency 3x / week   ? PT Duration 12 weeks   ? PT Treatment/Interventions ADLs/Self Care Home Management;Cryotherapy;Gait training;Stair training;Functional mobility training;Therapeutic activities;Therapeutic exercise;Balance training;Neuromuscular re-education;Manual techniques;Patient/family  education;Vasopneumatic Device   ? PT Next Visit Plan progress knee ROM, strength and functional gait   ? Consulted and Agree with Plan of Care Patient   ? ?  ?  ? ?  ? ? ?Patient will benefit from skilled therapeutic inter

## 2021-08-22 ENCOUNTER — Ambulatory Visit: Payer: BC Managed Care – PPO | Admitting: Physical Therapy

## 2021-08-24 ENCOUNTER — Ambulatory Visit: Payer: BC Managed Care – PPO | Admitting: Physical Therapy

## 2021-08-26 ENCOUNTER — Ambulatory Visit: Payer: BC Managed Care – PPO | Admitting: Physical Therapy

## 2021-08-26 ENCOUNTER — Encounter: Payer: Self-pay | Admitting: Physical Therapy

## 2021-08-26 DIAGNOSIS — M25562 Pain in left knee: Secondary | ICD-10-CM

## 2021-08-26 DIAGNOSIS — M25662 Stiffness of left knee, not elsewhere classified: Secondary | ICD-10-CM

## 2021-08-26 DIAGNOSIS — R262 Difficulty in walking, not elsewhere classified: Secondary | ICD-10-CM

## 2021-08-26 NOTE — Therapy (Signed)
[place] ?[address] ?[place], [place], [address] ?Phone: [phone]   Fax:  [phone] ? ?Physical Therapy Treatment ? ?Patient Details  ?Name: Glenn Lyons ?MRN: [id number] ?Date of Birth: May 10, 1956 ?Referring Provider (PT): Aluisio ? ? ?Encounter Date: 08/26/2021 ? ? PT End of Session - 08/26/21 1052   ? ? Visit Number 2   ? Date for PT Re-Evaluation 11/18/21   ? Authorization Type BCBS   ? PT Start Time 1015   ? PT Stop Time 1100   ? PT Time Calculation (min) 45 min   ? Activity Tolerance Patient tolerated treatment well;Patient limited by pain   ? Behavior During Therapy [facility] for tasks assessed/performed   ? ?  ?  ? ?  ? ? ?Past Medical History:  ?Diagnosis Date  ? Achalasia   ? with prev eval at [facility].  No intervention as of 2012  ? Arthritis   ? Aspiration pneumonia ([place])   ? Backache, unspecified   ? Cardiac pacemaker St. Jude   ? ERI 2008  ? Depressive disorder, not elsewhere classified   ? Diabetes ([place])   ? Morbid obesity ([place])   ? Obstructive sleep apnea   ? biapap settign 25-22  ? Presence of permanent cardiac pacemaker   ? St. Jude- Dr. Caryl Comes follows -device Check 07-06-14  ? Sinus bradycardia /pauses   ? Stricture and stenosis of esophagus   ? ? ?Past Surgical History:  ?Procedure Laterality Date  ? BALLOON DILATION N/A 10/06/2015  ? Procedure: BALLOON DILATION;  Surgeon: Mauri Pole, MD;  Location: [facility];  Service: Endoscopy;  Laterality: N/A;  ? BIOPSY  05/06/2018  ? Procedure: BIOPSY;  Surgeon: Irving Copas., MD;  Location: [place];  Service: Gastroenterology;;  ? COLONOSCOPY W/ POLYPECTOMY  11/01/2010  ? COLONOSCOPY WITH PROPOFOL N/A 10/05/2014  ? Procedure: COLONOSCOPY WITH PROPOFOL;  Surgeon: Gatha Mayer, MD;  [facility];  Service: Endoscopy;  Laterality: N/A;  ? COLONOSCOPY WITH PROPOFOL N/A 05/06/2018  ? Procedure: COLONOSCOPY WITH PROPOFOL;  Surgeon: Mansouraty, Telford Nab., MD;  Location: [facility];   Service: Gastroenterology;  Laterality: N/A;  ? ESOPHAGEAL DILATION    ? ESOPHAGEAL MANOMETRY N/A 08/23/2015  ? Procedure: ESOPHAGEAL MANOMETRY (EM);  Surgeon: Mauri Pole, MD;  [facility];  Service: Endoscopy;  Laterality: N/A;  ? ESOPHAGOGASTRODUODENOSCOPY N/A 10/09/2014  ? Procedure: ESOPHAGOGASTRODUODENOSCOPY (EGD);  Surgeon: Gatha Mayer, MD;  Location: [facility];  Service: Endoscopy;  Laterality: N/A;  ? ESOPHAGOGASTRODUODENOSCOPY (EGD) WITH PROPOFOL N/A 08/23/2015  ? Procedure: ESOPHAGOGASTRODUODENOSCOPY (EGD) WITH PROPOFOL;  Surgeon: Mauri Pole, MD;  [facility];  Service: Endoscopy;  Laterality: N/A;  ? ESOPHAGOGASTRODUODENOSCOPY (EGD) WITH PROPOFOL N/A 10/06/2015  ? Procedure: ESOPHAGOGASTRODUODENOSCOPY (EGD) WITH PROPOFOL;  Surgeon: Mauri Pole, MD;  Location: [facility];  Service: Endoscopy;  Laterality: N/A;  Rigiflex ballon size 3m-35mm size 45 minute proc,need Fluro ?Gastografin esophagram 2 hrs post EGD ?  ? GASTRIC ROUX-EN-Y N/A 04/05/2020  ? Procedure: LAPAROSCOPIC ROUX-EN-Y GASTRIC BYPASS WITH UPPER ENDOSCOPY;  Surgeon: WGreer Pickerel MD;  [facility];  Service: General;  Laterality: N/A;  ? PACEMAKER INSERTION    ? POLYPECTOMY  05/06/2018  ? Procedure: POLYPECTOMY;  Surgeon: MRush LandmarkGTelford Nab, MD;  [facility]  Service: Gastroenterology;;  ? PSan MateoN/A 05/01/2018  ? Procedure: PPM GENERATOR CHANGEOUT;  Surgeon: KDeboraha Sprang MD;  [facility];  Service: Cardiovascular;  Laterality:  N/A;  ? TOTAL KNEE ARTHROPLASTY Left 08/15/2021  ? Procedure: TOTAL KNEE ARTHROPLASTY;  Surgeon: Gaynelle Arabian, MD;  [facility];  Service: Orthopedics;  Laterality: Left;  ? ? ?There were no vitals filed for this visit. ? ? Subjective Assessment - 08/26/21 1021   ? ? Subjective pain and stiffness after last session   ? Currently in Pain? Yes   ? Pain Score 5    ? Pain Location Knee   ? Pain Orientation Left    ? ?  ?  ? ?  ? ? ? ? ? ? ? ? ? ? ? ? ? ? ? ? ? ? ? ? [place] Adult PT Treatment/Exercise - 08/26/21 0001   ? ?  ? Exercises  ? Exercises Knee/Hip   ?  ? Knee/Hip Exercises: Aerobic  ? Nustep L3 x 5 min   ?  ? Knee/Hip Exercises: Seated  ? Long Arc Quad AROM;Left;3 sets;5 reps   ? Hamstring Curl Strengthening;2 sets;10 reps;Left   ? Hamstring Limitations red   ?  ? Modalities  ? Modalities Vasopneumatic   ?  ? Vasopneumatic  ? Number Minutes Vasopneumatic  10 minutes   ? Vasopnuematic Location  Knee   ? Vasopneumatic Pressure Low   ? Vasopneumatic Temperature  34   ?  ? Manual Therapy  ? Manual Therapy Passive ROM   ? Passive ROM L knee flex and ext   ? ?  ?  ? ?  ? ? ? ? ? ? ? ? ? ? ? ? PT Short Term Goals - 08/18/21 1511   ? ?  ? PT SHORT TERM GOAL #1  ? Title indepednent with initial HEP   ? Time 2   ? Period Weeks   ? Status New   ? ?  ?  ? ?  ? ? ? ? PT Long Term Goals - 08/18/21 1512   ? ?  ? PT LONG TERM GOAL #1  ? Title understand RICE   ? Time 12   ? Period Weeks   ? Status New   ?  ? PT LONG TERM GOAL #2  ? Title increase AROM of the left knee to 10-115 degrees   ? Time 12   ? Period Weeks   ? Status New   ?  ? PT LONG TERM GOAL #3  ? Title decrease TUG time to 25 seconds   ? Time 12   ? Period Weeks   ? Status New   ?  ? PT LONG TERM GOAL #4  ? Title walk with SPC or LRAD x 300 feet   ? Time 12   ? Period Weeks   ? Status New   ?  ? PT LONG TERM GOAL #5  ? Title sit to stand without use of the UE's   ? Time 12   ? Period Weeks   ? Status New   ? ?  ?  ? ?  ? ? ? ? ? ? ? ? Plan - 08/26/21 1052   ? ? Clinical Impression Statement Pt enters clinic feeling well but report increase in pain after last session Explained to pt that having pain and is normal at this time with his diagnosis. Gentle PROM to start off session to assess pt tolerance. He was able to complete some intervention but required some effort. Decrease TKE with LAQ, pt was very hesitant with the first set of HS curls. Modality for edema control  and pain.   ? Rehab Potential Good   ? PT Frequency 3x / week   ? PT Duration 12 weeks   ? PT Treatment/Interventions ADLs/Self Care Home Management;Cryotherapy;Gait training;Stair training;Functional mobility training;Therapeutic activities;Therapeutic exercise;Balance training;Neuromuscular re-education;Manual techniques;Patient/family education;Vasopneumatic Device   ? PT Next Visit Plan progress knee ROM, strength and functional gait   ? ?  ?  ? ?  ? ? ?Patient will benefit from skilled therapeutic intervention in order to improve the following deficits and impairments:  Difficulty walking, Abnormal gait, Decreased coordination, Decreased range of motion, Cardiopulmonary status limiting activity, Decreased activity tolerance, Pain, Impaired flexibility, Decreased scar mobility, Decreased balance, Decreased mobility, Decreased strength, Increased edema ? ?Visit Diagnosis: ?Acute pain of left knee ? ?Difficulty in walking, not elsewhere classified ? ?Stiffness of left knee, not elsewhere classified ? ? ? ? ?Problem List ?Patient Active Problem List  ? Diagnosis Date Noted  ? Primary osteoarthritis of left knee 08/15/2021  ? Double vision 01/05/2021  ? S/P gastric bypass 04/05/2020  ? Need for shingles vaccine 01/28/2020  ? Foreskin problem 08/10/2019  ? HLD (hyperlipidemia) 08/05/2017  ? Routine general medical examination at a health care facility 07/13/2016  ? Advance care planning 07/13/2016  ? Type 2 diabetes mellitus with diabetic neuropathy, unspecified ([place]) 03/17/2016  ? Lower urinary tract symptoms (LUTS) 03/16/2016  ? Dysphagia   ? Essential hypertension   ? Hx of colonic polyps   ? Fatigue 08/04/2014  ? Rash and nonspecific skin eruption 10/21/2013  ? Morbid obesity with BMI of 50.0-59.9, adult ([place]) 05/24/2013  ? History of colonic polyps 04/29/2013  ? Anxiety state 03/10/2013  ? Pain in joint, shoulder region 02/08/2013  ? Sinus bradycardia /pauses   ? ED (erectile dysfunction) 02/21/2012  ? Edema  01/31/2012  ? Skin lesion 01/08/2012  ? OA (osteoarthritis) of knee 06/15/2011  ? Paresthesia 01/08/2011  ? Achalasia 08/17/2010  ? DEPRESSION 01/21/2010  ? Degenerative arthritis of left knee 01/21/2010

## 2021-08-29 ENCOUNTER — Encounter: Payer: Self-pay | Admitting: Physical Therapy

## 2021-08-29 ENCOUNTER — Ambulatory Visit: Payer: BC Managed Care – PPO | Attending: Orthopedic Surgery | Admitting: Physical Therapy

## 2021-08-29 DIAGNOSIS — R600 Localized edema: Secondary | ICD-10-CM | POA: Diagnosis present

## 2021-08-29 DIAGNOSIS — M25562 Pain in left knee: Secondary | ICD-10-CM | POA: Diagnosis present

## 2021-08-29 DIAGNOSIS — R262 Difficulty in walking, not elsewhere classified: Secondary | ICD-10-CM | POA: Diagnosis present

## 2021-08-29 DIAGNOSIS — M25662 Stiffness of left knee, not elsewhere classified: Secondary | ICD-10-CM | POA: Insufficient documentation

## 2021-08-29 NOTE — Therapy (Signed)
[place] ?[address] ?[place], [place], [address] ?Phone: [phone]   Fax:  [phone] ? ?Physical Therapy Treatment ? ?Patient Details  ?Name: Glenn Lyons ?MRN: [id number] ?Date of Birth: [birth year] ?Referring Provider (PT): Aluisio ? ? ?Encounter Date: 08/29/2021 ? ? PT End of Session - 08/29/21 1440   ? ? Visit Number 3   ? Date for PT Re-Evaluation 11/18/21   ? Authorization Type BCBS   ? PT Start Time 6160   ? PT Stop Time 1455   ? PT Time Calculation (min) 58 min   ? Equipment Utilized During Treatment Gait belt   ? Activity Tolerance Patient tolerated treatment well   ? Behavior During Therapy [facility] for tasks assessed/performed   ? ?  ?  ? ?  ? ? ?Past Medical History:  ?Diagnosis Date  ? Achalasia   ? with prev eval at [facility].  No intervention as of 2012  ? Arthritis   ? Aspiration pneumonia ([place])   ? Backache, unspecified   ? Cardiac pacemaker St. Jude   ? ERI 2008  ? Depressive disorder, not elsewhere classified   ? Diabetes ([place])   ? Morbid obesity ([place])   ? Obstructive sleep apnea   ? biapap settign 25-22  ? Presence of permanent cardiac pacemaker   ? St. Jude- Dr. Caryl Comes follows -device Check 07-06-14  ? Sinus bradycardia /pauses   ? Stricture and stenosis of esophagus   ? ? ?Past Surgical History:  ?Procedure Laterality Date  ? BALLOON DILATION N/A 10/06/2015  ? Procedure: BALLOON DILATION;  Surgeon: Mauri Pole, MD;  Location: [facility];  Service: Endoscopy;  Laterality: N/A;  ? BIOPSY  05/06/2018  ? Procedure: BIOPSY;  Surgeon: Irving Copas., MD;  Location: [facility];  Service: Gastroenterology;;  ? COLONOSCOPY W/ POLYPECTOMY  11/01/2010  ? COLONOSCOPY WITH PROPOFOL N/A 10/05/2014  ? Procedure: COLONOSCOPY WITH PROPOFOL;  Surgeon: Gatha Mayer, MD;  [facility];  Service: Endoscopy;  Laterality: N/A;  ? COLONOSCOPY WITH PROPOFOL N/A 05/06/2018  ? Procedure: COLONOSCOPY WITH PROPOFOL;  Surgeon: Mansouraty, Telford Nab., MD;   Location: [facility];  Service: Gastroenterology;  Laterality: N/A;  ? ESOPHAGEAL DILATION    ? ESOPHAGEAL MANOMETRY N/A 08/23/2015  ? Procedure: ESOPHAGEAL MANOMETRY (EM);  Surgeon: Mauri Pole, MD;  [facility];  Service: Endoscopy;  Laterality: N/A;  ? ESOPHAGOGASTRODUODENOSCOPY N/A 10/09/2014  ? Procedure: ESOPHAGOGASTRODUODENOSCOPY (EGD);  Surgeon: Gatha Mayer, MD;  [facility];  Service: Endoscopy;  Laterality: N/A;  ? ESOPHAGOGASTRODUODENOSCOPY (EGD) WITH PROPOFOL N/A 08/23/2015  ? Procedure: ESOPHAGOGASTRODUODENOSCOPY (EGD) WITH PROPOFOL;  Surgeon: Mauri Pole, MD;  [facility];  Service: Endoscopy;  Laterality: N/A;  ? ESOPHAGOGASTRODUODENOSCOPY (EGD) WITH PROPOFOL N/A 10/06/2015  ? Procedure: ESOPHAGOGASTRODUODENOSCOPY (EGD) WITH PROPOFOL;  Surgeon: Mauri Pole, MD;  Location: [facility];  Service: Endoscopy;  Laterality: N/A;  Rigiflex ballon size 44m-35mm size 45 minute proc,need Fluro ?Gastografin esophagram 2 hrs post EGD ?  ? GASTRIC ROUX-EN-Y N/A 04/05/2020  ? Procedure: LAPAROSCOPIC ROUX-EN-Y GASTRIC BYPASS WITH UPPER ENDOSCOPY;  Surgeon: WGreer Pickerel MD;  [facility];  Service: General;  Laterality: N/A;  ? PACEMAKER INSERTION    ? POLYPECTOMY  05/06/2018  ? Procedure: POLYPECTOMY;  Surgeon: MRush LandmarkGTelford Nab, MD;  [place]  Service: Gastroenterology;;  ? [place]/A 05/01/2018  ? Procedure: PPM GENERATOR CHANGEOUT;  Surgeon: KDeboraha Sprang MD;  [facility];  Service: Cardiovascular;  Laterality: N/A;  ? TOTAL KNEE ARTHROPLASTY Left 08/15/2021  ? Procedure: TOTAL KNEE ARTHROPLASTY;  Surgeon: Gaynelle Arabian, MD;  [facility];  Service: Orthopedics;  Laterality: Left;  ? ? ?There were no vitals filed for this visit. ? ? Subjective Assessment - 08/29/21 1435   ? ? Subjective Patient reports sore after last treatment, will see MD tomorrow   ? Currently in Pain? Yes   ? Pain Score 5     ? Pain Location Knee   ? Pain Orientation Left   ? Pain Descriptors / Indicators Aching   ? Pain Relieving Factors liked the vaso   ? ?  ?  ? ?  ? ? ? ? ? [facility] PT Assessment - 08/29/21 0001   ? ?  ? AROM  ? Left Knee Extension 20   ? Left Knee Flexion 92   ?  ? PROM  ? Left Knee Extension 10   ? Left Knee Flexion 98   ? ?  ?  ? ?  ? ? ? ? ? ? ? ? ? ? ? ? ? ? ? ? [facility] Adult PT Treatment/Exercise - 08/29/21 0001   ? ?  ? Ambulation/Gait  ? Gait Comments started with SPC, using gait belt CGA, some cues for sequencing, did well and we talked about SPC in the home at times   ?  ? Knee/Hip Exercises: Aerobic  ? Nustep L4 x 6 min   ?  ? Knee/Hip Exercises: Machines for Strengthening  ? Cybex Knee Flexion 25# 2x15 both legs   ?  ? Knee/Hip Exercises: Standing  ? Heel Raises Both;2 sets;15 reps   ?  ? Knee/Hip Exercises: Seated  ? Long Arc Quad AROM;Left;3 sets;5 reps   ?  ? Vasopneumatic  ? Number Minutes Vasopneumatic  10 minutes   ? Vasopnuematic Location  Knee   ? Vasopneumatic Pressure Medium   ? Vasopneumatic Temperature  35   ?  ? Manual Therapy  ? Manual Therapy Passive ROM;Joint mobilization   ? Joint Mobilization gentle leg distraction, some flexion tibia on femur anteriorly   ? Passive ROM L knee flex and ext   ? ?  ?  ? ?  ? ? ? ? ? ? ? ? ? ? ? ? PT Short Term Goals - 08/18/21 1511   ? ?  ? PT SHORT TERM GOAL #1  ? Title indepednent with initial HEP   ? Time 2   ? Period Weeks   ? Status New   ? ?  ?  ? ?  ? ? ? ? PT Long Term Goals - 08/29/21 1443   ? ?  ? PT LONG TERM GOAL #1  ? Title understand RICE   ? Status Partially Met   ?  ? PT LONG TERM GOAL #2  ? Title increase AROM of the left knee to 10-115 degrees   ? Status On-going   ?  ? PT LONG TERM GOAL #3  ? Title decrease TUG time to 25 seconds   ? Status On-going   ? ?  ?  ? ?  ? ? ? ? ? ? ? ? Plan - 08/29/21 1441   ? ? Clinical Impression Statement Patient overall feeling better with his pain, still very painful and has diffiuclty with PROM into flexion,  has made very good progress with this even though he missed two visits due to pain.  Tried walking with a  SPC today, did well with some cues, CGA- supervision.  Added some joint mobs, see MD tomorrow   ? PT Next Visit Plan proress as tolerated see what the MD says   ? Consulted and Agree with Plan of Care Patient   ? ?  ?  ? ?  ? ? ?Patient will benefit from skilled therapeutic intervention in order to improve the following deficits and impairments:  Difficulty walking, Abnormal gait, Decreased coordination, Decreased range of motion, Cardiopulmonary status limiting activity, Decreased activity tolerance, Pain, Impaired flexibility, Decreased scar mobility, Decreased balance, Decreased mobility, Decreased strength, Increased edema ? ?Visit Diagnosis: ?Acute pain of left knee ? ?Difficulty in walking, not elsewhere classified ? ?Stiffness of left knee, not elsewhere classified ? ?Localized edema ? ? ? ? ?Problem List ?Patient Active Problem List  ? Diagnosis Date Noted  ? Primary osteoarthritis of left knee 08/15/2021  ? Double vision 01/05/2021  ? S/P gastric bypass 04/05/2020  ? Need for shingles vaccine 01/28/2020  ? Foreskin problem 08/10/2019  ? HLD (hyperlipidemia) 08/05/2017  ? Routine general medical examination at a health care facility 07/13/2016  ? Advance care planning 07/13/2016  ? Type 2 diabetes mellitus with diabetic neuropathy, unspecified ([place]) 03/17/2016  ? Lower urinary tract symptoms (LUTS) 03/16/2016  ? Dysphagia   ? Essential hypertension   ? Hx of colonic polyps   ? Fatigue 08/04/2014  ? Rash and nonspecific skin eruption 10/21/2013  ? Morbid obesity with BMI of 50.0-59.9, adult ([place]) 05/24/2013  ? History of colonic polyps 04/29/2013  ? Anxiety state 03/10/2013  ? Pain in joint, shoulder region 02/08/2013  ? Sinus bradycardia /pauses   ? ED (erectile dysfunction) 02/21/2012  ? Edema 01/31/2012  ? Skin lesion 01/08/2012  ? OA (osteoarthritis) of knee 06/15/2011  ? Paresthesia 01/08/2011  ?  Achalasia 08/17/2010  ? DEPRESSION 01/21/2010  ? Degenerative arthritis of left knee 01/21/2010  ? ANGINA, HX OF 01/21/2010  ? PACEMAKER, [employer] Judes 01/21/2010  ? BACK PAIN 01/05/2010  ? Obesity 12/08/18

## 2021-08-31 ENCOUNTER — Ambulatory Visit: Payer: BC Managed Care – PPO | Admitting: Physical Therapy

## 2021-08-31 ENCOUNTER — Encounter: Payer: Self-pay | Admitting: Physical Therapy

## 2021-08-31 ENCOUNTER — Ambulatory Visit: Payer: 59 | Admitting: Internal Medicine

## 2021-08-31 DIAGNOSIS — M25562 Pain in left knee: Secondary | ICD-10-CM | POA: Diagnosis not present

## 2021-08-31 DIAGNOSIS — R600 Localized edema: Secondary | ICD-10-CM

## 2021-08-31 DIAGNOSIS — M25662 Stiffness of left knee, not elsewhere classified: Secondary | ICD-10-CM

## 2021-08-31 DIAGNOSIS — R262 Difficulty in walking, not elsewhere classified: Secondary | ICD-10-CM

## 2021-08-31 NOTE — Discharge Summary (Signed)
Physician Discharge Summary  ? ?Patient ID: ?Glenn Lyons ?MRN: [id number] ?DOB/AGE: [birth year] 66 y.o. ? ?Admit date: 08/15/2021 ?Discharge date: 08/16/2021 ? ?Primary Diagnosis: Osteoarthritis of left knee  ? ?Admission Diagnoses:  ?Past Medical History:  ?Diagnosis Date  ? Achalasia   ? with prev eval at [facility].  No intervention as of 2012  ? Arthritis   ? Aspiration pneumonia ([place])   ? Backache, unspecified   ? Cardiac pacemaker St. Jude   ? ERI 2008  ? Depressive disorder, not elsewhere classified   ? Diabetes ([place])   ? Morbid obesity ([place])   ? Obstructive sleep apnea   ? biapap settign 25-22  ? Presence of permanent cardiac pacemaker   ? St. Jude- Dr. Caryl Comes follows -device Check 07-06-14  ? Sinus bradycardia /pauses   ? Stricture and stenosis of esophagus   ? ?Discharge Diagnoses:   ?Principal Problem: ?  Degenerative arthritis of left knee ?Active Problems: ?  Primary osteoarthritis of left knee ? ?Estimated body mass index is 40.82 kg/m? as calculated from the following: ?  Height as of this encounter: 6' (1.829 m). ?  Weight as of this encounter: 136.5 kg. ? ?Procedure:  ?Procedure(s) (LRB): ?TOTAL KNEE ARTHROPLASTY (Left)  ? ?Consults: None ? ?HPI: Glenn Lyons is a 66 y.o. year old male with end stage OA of his left knee with progressively worsening pain and dysfunction. He has constant pain, with activity and at rest and significant functional deficits with difficulties even with ADLs. He has had extensive non-op management including analgesics, injections of cortisone and viscosupplements, and home exercise program, but remains in significant pain with significant dysfunction. Radiographs show bone on bone arthritis medial and patellofemoral. He presents now for left Total Knee Arthroplasty.   ? ?Laboratory Data: ?Admission on 08/15/2021, Discharged on 08/16/2021  ?Component Date Value Ref Range Status  ? Glucose-Capillary 08/15/2021 144 (H)  70 - 99 mg/dL Final  ? Glucose reference range applies only to  samples taken after fasting for at least 8 hours.  ? Glucose-Capillary 08/15/2021 204 (H)  70 - 99 mg/dL Final  ? Glucose reference range applies only to samples taken after fasting for at least 8 hours.  ? WBC 08/16/2021 19.0 (H)  4.0 - 10.5 K/uL Final  ? RBC 08/16/2021 4.76  4.22 - 5.81 MIL/uL Final  ? Hemoglobin 08/16/2021 14.9  13.0 - 17.0 g/dL Final  ? HCT 08/16/2021 44.7  39.0 - 52.0 % Final  ? MCV 08/16/2021 93.9  80.0 - 100.0 fL Final  ? MCH 08/16/2021 31.3  26.0 - 34.0 pg Final  ? MCHC 08/16/2021 33.3  30.0 - 36.0 g/dL Final  ? RDW 08/16/2021 13.6  11.5 - 15.5 % Final  ? Platelets 08/16/2021 209  150 - 400 K/uL Final  ? nRBC 08/16/2021 0.0  0.0 - 0.2 % Final  ? Performed at [facility], [place] [address] [place], [place] [address]  ? Sodium 08/16/2021 134 (L)  135 - 145 mmol/L Final  ? Potassium 08/16/2021 4.9  3.5 - 5.1 mmol/L Final  ? Chloride 08/16/2021 103  98 - 111 mmol/L Final  ? CO2 08/16/2021 25  22 - 32 mmol/L Final  ? Glucose, Bld 08/16/2021 180 (H)  70 - 99 mg/dL Final  ? Glucose reference range applies only to samples taken after fasting for at least 8 hours.  ? BUN 08/16/2021 14  8 - 23 mg/dL Final  ? Creatinine, Ser 08/16/2021 0.79  0.61 - 1.24  mg/dL Final  ? Calcium 08/16/2021 9.2  8.9 - 10.3 mg/dL Final  ? GFR, Estimated 08/16/2021 >60  >60 mL/min Final  ? Comment: (NOTE) ?Calculated using the CKD-EPI Creatinine Equation (2021) ?  ? Anion gap 08/16/2021 6  5 - 15 Final  ? Performed at [facility], [place] [address] [place], [place] [address]  ? Glucose-Capillary 08/15/2021 238 (H)  70 - 99 mg/dL Final  ? Glucose reference range applies only to samples taken after fasting for at least 8 hours.  ? Glucose-Capillary 08/15/2021 240 (H)  70 - 99 mg/dL Final  ? Glucose reference range applies only to samples taken after fasting for at least 8 hours.  ? Glucose-Capillary 08/16/2021 185 (H)  70 - 99 mg/dL Final  ? Glucose reference range applies only to samples  taken after fasting for at least 8 hours.  ? Glucose-Capillary 08/16/2021 144 (H)  70 - 99 mg/dL Final  ? Glucose reference range applies only to samples taken after fasting for at least 8 hours.  ?Hospital Outpatient Visit on 08/12/2021  ?Component Date Value Ref Range Status  ? MRSA, PCR 08/12/2021 NEGATIVE  NEGATIVE Final  ? Staphylococcus aureus 08/12/2021 NEGATIVE  NEGATIVE Final  ? Comment: (NOTE) ?The Xpert SA Assay (FDA approved for NASAL specimens in patients 29 ?years of age and older), is one component of a comprehensive ?surveillance program. It is not intended to diagnose infection nor to ?guide or monitor treatment. ?Performed at [facility], [place] [address] ?[place], [place] [address] ?  ? WBC 08/12/2021 9.0  4.0 - 10.5 K/uL Final  ? RBC 08/12/2021 5.18  4.22 - 5.81 MIL/uL Final  ? Hemoglobin 08/12/2021 16.1  13.0 - 17.0 g/dL Final  ? HCT 08/12/2021 47.3  39.0 - 52.0 % Final  ? MCV 08/12/2021 91.3  80.0 - 100.0 fL Final  ? MCH 08/12/2021 31.1  26.0 - 34.0 pg Final  ? MCHC 08/12/2021 34.0  30.0 - 36.0 g/dL Final  ? RDW 08/12/2021 13.6  11.5 - 15.5 % Final  ? Platelets 08/12/2021 244  150 - 400 K/uL Final  ? nRBC 08/12/2021 0.0  0.0 - 0.2 % Final  ? Performed at [facility], [place] [address], [place], [place] [address]  ? Sodium 08/12/2021 134 (L)  135 - 145 mmol/L Final  ? Potassium 08/12/2021 4.1  3.5 - 5.1 mmol/L Final  ? Chloride 08/12/2021 103  98 - 111 mmol/L Final  ? CO2 08/12/2021 24  22 - 32 mmol/L Final  ? Glucose, Bld 08/12/2021 155 (H)  70 - 99 mg/dL Final  ? Glucose reference range applies only to samples taken after fasting for at least 8 hours.  ? BUN 08/12/2021 14  8 - 23 mg/dL Final  ? Creatinine, Ser 08/12/2021 0.77  0.61 - 1.24 mg/dL Final  ? Calcium 08/12/2021 9.4  8.9 - 10.3 mg/dL Final  ? Total Protein 08/12/2021 7.1  6.5 - 8.1 g/dL Final  ? Albumin 08/12/2021 3.9  3.5 - 5.0 g/dL Final  ? AST 08/12/2021 26  15 - 41 U/L Final  ? ALT 08/12/2021  28  0 - 44 U/L Final  ? Alkaline Phosphatase 08/12/2021 74  38 - 126 U/L Final  ? Total Bilirubin 08/12/2021 0.7  0.3 - 1.2 mg/dL Final  ? GFR, Estimated 08/12/2021 >60  >60 mL/min Final  ? Comment: (NOTE) ?Calculated using the CKD-EPI Creatinine Equation (2021) ?  ? Anion gap 08/12/2021 7  5 -  15 Final  ? Performed at [facility], [place] [address] [place], [place] [address]  ? ABO/RH(D) 08/12/2021 A POS   Final  ? Antibody Screen 08/12/2021 NEG   Final  ? Sample Expiration 08/12/2021 08/18/2021,2359   Final  ? Extend sample reason 08/12/2021    Final  ?                 Value:NO TRANSFUSIONS OR PREGNANCY IN THE PAST 3 MONTHS ?Performed at [facility], [place] [address], [place], [place] [address] ?  ? Prothrombin Time 08/12/2021 13.7  11.4 - 15.2 seconds Final  ? INR 08/12/2021 1.1  0.8 - 1.2 Final  ? Comment: (NOTE) ?INR goal varies based on device and disease states. ?Performed at [facility], [place] [address] ?[place], [place] [address] ?  ? Hgb A1c MFr Bld 08/12/2021 6.4 (H)  4.8 - 5.6 % Final  ? Comment: (NOTE) ?Pre diabetes:          5.7%-6.4% ? ?Diabetes:              >6.4% ? ?Glycemic control for   <7.0% ?adults with diabetes ?  ? Mean Plasma Glucose 08/12/2021 136.98  mg/dL Final  ? Performed at [place] [address] [place],  [address]  ? Glucose-Capillary 08/12/2021 193 (H)  70 - 99 mg/dL Final  ? Glucose reference range applies only to samples taken after fasting for at least 8 hours.  ?Clinical Support on 07/18/2021  ?Component Date Value Ref Range Status  ? Date Time Interrogation Session 07/18/2021 [id number]   Final  ? Pulse Generator Manufacturer 07/18/2021 SJCR   Final  ? Pulse Gen Model 07/18/2021 2272 Assurity MRI   Final  ? Pulse Gen Serial Number 07/18/2021 [id number]   Final  ? Clinic Name 07/18/2021 [facility]   Final  ? Implantable Pulse Generator Type 07/18/2021 Implantable Pulse Generator   Final  ?  Implantable Pulse Generator Implan* 07/18/2021 [id number]   Final  ? Implantable Lead Manufacturer 07/18/2021 PACE   Final  ? Implantable Lead Model 07/18/2021 [id number] Tendril SDX   Final  ? Implantable Lead Serial Numbe

## 2021-08-31 NOTE — Therapy (Signed)
[place] [facility] [address] ?[place], [place], [address] ?Phone: [phone]   Fax:  [phone] ? ?Physical Therapy Treatment ? ?Patient Details  ?Name: Glenn Lyons ?MRN: [id number] ?Date of Birth: Aug 05, 1955 ?Referring Provider (PT): Aluisio ? ? ?Encounter Date: 08/31/2021 ? ? PT End of Session - 08/31/21 1441   ? ? Visit Number 4   ? Date for PT Re-Evaluation 11/18/21   ? Authorization Type BCBS   ? PT Start Time 1350   ? PT Stop Time 1455   ? PT Time Calculation (min) 65 min   ? Activity Tolerance Patient tolerated treatment well   ? Behavior During Therapy [facility] for tasks assessed/performed   ? ?  ?  ? ?  ? ? ?Past Medical History:  ?Diagnosis Date  ? Achalasia   ? with prev eval at [facility].  No intervention as of 2012  ? Arthritis   ? Aspiration pneumonia ([place])   ? Backache, unspecified   ? Cardiac pacemaker St. Jude   ? ERI 2008  ? Depressive disorder, not elsewhere classified   ? Diabetes ([place])   ? Morbid obesity ([place])   ? Obstructive sleep apnea   ? biapap settign 25-22  ? Presence of permanent cardiac pacemaker   ? St. Jude- Dr. Caryl Comes follows -device Check 07-06-14  ? Sinus bradycardia /pauses   ? Stricture and stenosis of esophagus   ? ? ?Past Surgical History:  ?Procedure Laterality Date  ? BALLOON DILATION N/A 10/06/2015  ? Procedure: BALLOON DILATION;  Surgeon: Mauri Pole, MD;  Location: [facility];  Service: Endoscopy;  Laterality: N/A;  ? BIOPSY  05/06/2018  ? Procedure: BIOPSY;  Surgeon: Irving Copas., MD;  Location: [facility];  Service: Gastroenterology;;  ? COLONOSCOPY W/ POLYPECTOMY  11/01/2010  ? COLONOSCOPY WITH PROPOFOL N/A 10/05/2014  ? Procedure: COLONOSCOPY WITH PROPOFOL;  Surgeon: Gatha Mayer, MD;  [facility];  Service: Endoscopy;  Laterality: N/A;  ? COLONOSCOPY WITH PROPOFOL N/A 05/06/2018  ? Procedure: COLONOSCOPY WITH PROPOFOL;  Surgeon: Mansouraty, Telford Nab., MD;  Location: [facility];  Service:  Gastroenterology;  Laterality: N/A;  ? ESOPHAGEAL DILATION    ? ESOPHAGEAL MANOMETRY N/A 08/23/2015  ? Procedure: ESOPHAGEAL MANOMETRY (EM);  Surgeon: Mauri Pole, MD;  [facility];  Service: Endoscopy;  Laterality: N/A;  ? ESOPHAGOGASTRODUODENOSCOPY N/A 10/09/2014  ? Procedure: ESOPHAGOGASTRODUODENOSCOPY (EGD);  Surgeon: Gatha Mayer, MD;  [facility];  Service: Endoscopy;  Laterality: N/A;  ? ESOPHAGOGASTRODUODENOSCOPY (EGD) WITH PROPOFOL N/A 08/23/2015  ? Procedure: ESOPHAGOGASTRODUODENOSCOPY (EGD) WITH PROPOFOL;  Surgeon: Mauri Pole, MD;  [facility];  Service: Endoscopy;  Laterality: N/A;  ? ESOPHAGOGASTRODUODENOSCOPY (EGD) WITH PROPOFOL N/A 10/06/2015  ? Procedure: ESOPHAGOGASTRODUODENOSCOPY (EGD) WITH PROPOFOL;  Surgeon: Mauri Pole, MD;  Location: [facility];  Service: Endoscopy;  Laterality: N/A;  Rigiflex ballon size 56m-35mm size 45 minute proc,need Fluro ?Gastografin esophagram 2 hrs post EGD ?  ? GASTRIC ROUX-EN-Y N/A 04/05/2020  ? Procedure: LAPAROSCOPIC ROUX-EN-Y GASTRIC BYPASS WITH UPPER ENDOSCOPY;  Surgeon: WGreer Pickerel MD;  [facility];  Service: General;  Laterality: N/A;  ? PACEMAKER INSERTION    ? POLYPECTOMY  05/06/2018  ? Procedure: POLYPECTOMY;  Surgeon: MRush LandmarkGTelford Nab, MD;  Location: [facility]  Service: Gastroenterology;;  ? PIrwinN/A 05/01/2018  ? Procedure: PPM GENERATOR CHANGEOUT;  Surgeon: KDeboraha Sprang MD;  [facility];  Service: Cardiovascular;  Laterality: N/A;  ?  TOTAL KNEE ARTHROPLASTY Left 08/15/2021  ? Procedure: TOTAL KNEE ARTHROPLASTY;  Surgeon: Gaynelle Arabian, MD;  [facility];  Service: Orthopedics;  Laterality: Left;  ? ? ?There were no vitals filed for this visit. ? ? Subjective Assessment - 08/31/21 1402   ? ? Subjective Saw the MD, reports good report, he reports he is more sore today   ? Currently in Pain? Yes   ? Pain Score 5    ? Pain Location Knee   ? Pain  Orientation Left   ? Pain Descriptors / Indicators Sore;Aching   ? Pain Relieving Factors ice, vaso rally help and feel good   ? ?  ?  ? ?  ? ? ? ? ? ? ? ? ? ? ? ? ? ? ? ? ? ? ? ? [facility] Adult PT Treatment/Exercise - 08/31/21 0001   ? ?  ? Ambulation/Gait  ? Gait Comments gait with SPC and without cane in the clinic   ?  ? Knee/Hip Exercises: Aerobic  ? Nustep L4 x 6 min   ?  ? Knee/Hip Exercises: Machines for Strengthening  ? Cybex Knee Flexion 25# 2x15 both legs   ? Cybex Leg Press 20# both legs 2x 7, position #10, did without weight and going down to 6.5, then one leg only no weight   ?  ? Knee/Hip Exercises: Supine  ? Short Arc [employer] Left;2 sets;10 reps   ? Short Arc [employer] Limitations 3#   ? Other Supine Knee/Hip Exercises feet on ball K2C, small bridges   ?  ? Vasopneumatic  ? Number Minutes Vasopneumatic  10 minutes   ? Vasopnuematic Location  Knee   ? Vasopneumatic Pressure Medium   ? Vasopneumatic Temperature  35   ?  ? Manual Therapy  ? Manual Therapy Passive ROM;Joint mobilization;Soft tissue mobilization   ? Joint Mobilization gentle leg distraction, some flexion tibia on femur anteriorly   ? Soft tissue mobilization gentle scar and skin mobility of the left knee in sitting   ? Passive ROM L knee flex and ext   ? ?  ?  ? ?  ? ? ? ? ? ? ? ? ? ? ? ? PT Short Term Goals - 08/18/21 1511   ? ?  ? PT SHORT TERM GOAL #1  ? Title indepednent with initial HEP   ? Time 2   ? Period Weeks   ? Status New   ? ?  ?  ? ?  ? ? ? ? PT Long Term Goals - 08/31/21 1526   ? ?  ? PT LONG TERM GOAL #1  ? Title understand RICE   ? Status Achieved   ? ?  ?  ? ?  ? ? ? ? ? ? ? ? Plan - 08/31/21 1503   ? ? Clinical Impression Statement Patient overall felt better with his pain. Tried walking w/o assistive device, did well. He had stiffness with PROM into flexion, but has made good progress since last visit. He was able to add some strengthening of his quads w/o pain, tried bridge w/ ball but was unable he was able to do a  bridge w/o ball cues were needed for correct posture, engaging core, and to slow down motions.   ? PT Treatment/Interventions ADLs/Self Care Home Management;Cryotherapy;Gait training;Stair training;Functional mobility training;Therapeutic activities;Therapeutic exercise;Balance training;Neuromuscular re-education;Manual techniques;Patient/family education;Vasopneumatic Device   ? PT Next Visit Plan progress walking, ROM and strength as tolerated   ? Consulted  and Agree with Plan of Care Patient   ? ?  ?  ? ?  ? ? ?Patient will benefit from skilled therapeutic intervention in order to improve the following deficits and impairments:  Difficulty walking, Abnormal gait, Decreased coordination, Decreased range of motion, Cardiopulmonary status limiting activity, Decreased activity tolerance, Pain, Impaired flexibility, Decreased scar mobility, Decreased balance, Decreased mobility, Decreased strength, Increased edema ? ?Visit Diagnosis: ?Acute pain of left knee ? ?Difficulty in walking, not elsewhere classified ? ?Stiffness of left knee, not elsewhere classified ? ?Localized edema ? ? ? ? ?Problem List ?Patient Active Problem List  ? Diagnosis Date Noted  ? Primary osteoarthritis of left knee 08/15/2021  ? Double vision 01/05/2021  ? S/P gastric bypass 04/05/2020  ? Need for shingles vaccine 01/28/2020  ? Foreskin problem 08/10/2019  ? HLD (hyperlipidemia) 08/05/2017  ? Routine general medical examination at a health care facility 07/13/2016  ? Advance care planning 07/13/2016  ? Type 2 diabetes mellitus with diabetic neuropathy, unspecified ([place]) 03/17/2016  ? Lower urinary tract symptoms (LUTS) 03/16/2016  ? Dysphagia   ? Essential hypertension   ? Hx of colonic polyps   ? Fatigue 08/04/2014  ? Rash and nonspecific skin eruption 10/21/2013  ? Morbid obesity with BMI of 50.0-59.9, adult ([place]) 05/24/2013  ? History of colonic polyps 04/29/2013  ? Anxiety state 03/10/2013  ? Pain in joint, shoulder region 02/08/2013  ?  Sinus bradycardia /pauses   ? ED (erectile dysfunction) 02/21/2012  ? Edema 01/31/2012  ? Skin lesion 01/08/2012  ? OA (osteoarthritis) of knee 06/15/2011  ? Paresthesia 01/08/2011  ? Achalasia 08/17/2010  ? DEPRESSION

## 2021-09-02 ENCOUNTER — Ambulatory Visit: Payer: BC Managed Care – PPO | Admitting: Physical Therapy

## 2021-09-02 ENCOUNTER — Encounter: Payer: Self-pay | Admitting: Physical Therapy

## 2021-09-02 DIAGNOSIS — M25562 Pain in left knee: Secondary | ICD-10-CM | POA: Diagnosis not present

## 2021-09-02 DIAGNOSIS — R600 Localized edema: Secondary | ICD-10-CM

## 2021-09-02 DIAGNOSIS — R262 Difficulty in walking, not elsewhere classified: Secondary | ICD-10-CM

## 2021-09-02 DIAGNOSIS — M25662 Stiffness of left knee, not elsewhere classified: Secondary | ICD-10-CM

## 2021-09-02 NOTE — Therapy (Signed)
[place] ?[place] ?[address] ?[place], [place], [address] ?Phone: [phone]   Fax:  [phone] ? ?Physical Therapy Treatment ? ?Patient Details  ?Name: Glenn Lyons ?MRN: [id number] ?Date of Birth: [birth year] ?Referring Provider (PT): Aluisio ? ? ?Encounter Date: 09/02/2021 ? ? PT End of Session - 09/02/21 1138   ? ? Visit Number 5   ? Date for PT Re-Evaluation 11/18/21   ? Authorization Type BCBS   ? PT Start Time 1058   ? PT Stop Time 1147   ? PT Time Calculation (min) 49 min   ? Activity Tolerance Patient tolerated treatment well   ? Behavior During Therapy [facility] for tasks assessed/performed   ? ?  ?  ? ?  ? ? ?Past Medical History:  ?Diagnosis Date  ? Achalasia   ? with prev eval at [facility].  No intervention as of 2012  ? Arthritis   ? Aspiration pneumonia ([place])   ? Backache, unspecified   ? Cardiac pacemaker St. Jude   ? ERI 2008  ? Depressive disorder, not elsewhere classified   ? Diabetes ([place])   ? Morbid obesity ([place])   ? Obstructive sleep apnea   ? biapap settign 25-22  ? Presence of permanent cardiac pacemaker   ? St. Jude- Dr. Caryl Comes follows -device Check 07-06-14  ? Sinus bradycardia /pauses   ? Stricture and stenosis of esophagus   ? ? ?Past Surgical History:  ?Procedure Laterality Date  ? BALLOON DILATION N/A 10/06/2015  ? Procedure: BALLOON DILATION;  Surgeon: Mauri Pole, MD;  Location: [facility]OSCOPY;  Service: Endoscopy;  Laterality: N/A;  ? BIOPSY  05/06/2018  ? Procedure: BIOPSY;  Surgeon: Irving Copas., MD;  Location: [place];  Service: Gastroenterology;;  ? COLONOSCOPY W/ POLYPECTOMY  11/01/2010  ? COLONOSCOPY WITH PROPOFOL N/A 10/05/2014  ? Procedure: COLONOSCOPY WITH PROPOFOL;  Surgeon: Gatha Mayer, MD;  [facility];  Service: Endoscopy;  Laterality: N/A;  ? COLONOSCOPY WITH PROPOFOL N/A 05/06/2018  ? Procedure: COLONOSCOPY WITH PROPOFOL;  Surgeon: Mansouraty, Telford Nab., MD;  Location: [place];  Service:  Gastroenterology;  Laterality: N/A;  ? ESOPHAGEAL DILATION    ? ESOPHAGEAL MANOMETRY N/A 08/23/2015  ? Procedure: ESOPHAGEAL MANOMETRY (EM);  Surgeon: Mauri Pole, MD;  [facility];  Service: Endoscopy;  Laterality: N/A;  ? ESOPHAGOGASTRODUODENOSCOPY N/A 10/09/2014  ? Procedure: ESOPHAGOGASTRODUODENOSCOPY (EGD);  Surgeon: Gatha Mayer, MD;  Location: [facility];  Service: Endoscopy;  Laterality: N/A;  ? ESOPHAGOGASTRODUODENOSCOPY (EGD) WITH PROPOFOL N/A 08/23/2015  ? Procedure: ESOPHAGOGASTRODUODENOSCOPY (EGD) WITH PROPOFOL;  Surgeon: Mauri Pole, MD;  [facility];  Service: Endoscopy;  Laterality: N/A;  ? ESOPHAGOGASTRODUODENOSCOPY (EGD) WITH PROPOFOL N/A 10/06/2015  ? Procedure: ESOPHAGOGASTRODUODENOSCOPY (EGD) WITH PROPOFOL;  Surgeon: Mauri Pole, MD;  Location: [facility]OSCOPY;  Service: Endoscopy;  Laterality: N/A;  Rigiflex ballon size 16m-35mm size 45 minute proc,need Fluro ?Gastografin esophagram 2 hrs post EGD ?  ? GASTRIC ROUX-EN-Y N/A 04/05/2020  ? Procedure: LAPAROSCOPIC ROUX-EN-Y GASTRIC BYPASS WITH UPPER ENDOSCOPY;  Surgeon: WGreer Pickerel MD;  [facility];  Service: General;  Laterality: N/A;  ? PACEMAKER INSERTION    ? POLYPECTOMY  05/06/2018  ? Procedure: POLYPECTOMY;  Surgeon: MRush LandmarkGTelford Nab, MD;  Location: [place]  Service: Gastroenterology;;  ? [place] 05/01/2018  ? Procedure: PPM GENERATOR CHANGEOUT;  Surgeon: KDeboraha Sprang MD;  [facility];  Service: Cardiovascular;  Laterality: N/A;  ?  TOTAL KNEE ARTHROPLASTY Left 08/15/2021  ? Procedure: TOTAL KNEE ARTHROPLASTY;  Surgeon: Gaynelle Arabian, MD;  [facility];  Service: Orthopedics;  Laterality: Left;  ? ? ?There were no vitals filed for this visit. ? ? Subjective Assessment - 09/02/21 1058   ? ? Subjective "I am dong good"   ? Currently in Pain? Yes   ? Pain Score 4    ? Pain Location Knee   stiff  ? Pain Orientation Left   ? ?  ?  ? ?   ? ? ? ? ? ? ? ? ? ? ? ? ? ? ? ? ? ? ? ? [place] Adult PT Treatment/Exercise - 09/02/21 0001   ? ?  ? Knee/Hip Exercises: Aerobic  ? Nustep L5 x 6 min   ?  ? Knee/Hip Exercises: Machines for Strengthening  ? Cybex Knee Extension 5lb 2x10   ? Cybex Knee Flexion 25# 2x15 both legs   ? Cybex Leg Press 20 2x10, LLE TKE 20lb 2x10   ?  ? Knee/Hip Exercises: Seated  ? Sit to Sand 2 sets;10 reps;without UE support   ?  ? Vasopneumatic  ? Number Minutes Vasopneumatic  10 minutes   ? Vasopnuematic Location  Knee   ? Vasopneumatic Pressure Medium   ? Vasopneumatic Temperature  35   ?  ? Manual Therapy  ? Manual Therapy Passive ROM;Joint mobilization;Soft tissue mobilization   ? Joint Mobilization gentle leg distraction, some flexion tibia on femur anteriorly   ? Soft tissue mobilization gentle scar and skin mobility of the left knee in sitting   ? Passive ROM L knee flex and ext   ? ?  ?  ? ?  ? ? ? ? ? ? ? ? ? ? ? ? PT Short Term Goals - 08/18/21 1511   ? ?  ? PT SHORT TERM GOAL #1  ? Title indepednent with initial HEP   ? Time 2   ? Period Weeks   ? Status New   ? ?  ?  ? ?  ? ? ? ? PT Long Term Goals - 09/02/21 1139   ? ?  ? PT LONG TERM GOAL #1  ? Title understand RICE   ? Status Achieved   ? ?  ?  ? ?  ? ? ? ? ? ? ? ? Plan - 09/02/21 1139   ? ? Clinical Impression Statement Pt enters clinic ambulating with [facility] reporting that he was pleased with is progress. Progressed with machine level interventions without issue. Cue to really use LLE with seated curls and extensions. No issues with SL TKE on leg press. PT does tend to lean to his R with sit to stands.   ? Stability/Clinical Decision Making Evolving/Moderate complexity   ? Rehab Potential Good   ? PT Frequency 3x / week   ? PT Duration 12 weeks   ? PT Treatment/Interventions ADLs/Self Care Home Management;Cryotherapy;Gait training;Stair training;Functional mobility training;Therapeutic activities;Therapeutic exercise;Balance training;Neuromuscular re-education;Manual  techniques;Patient/family education;Vasopneumatic Device   ? PT Next Visit Plan progress walking, ROM and strength as tolerated   ? ?  ?  ? ?  ? ? ?Patient will benefit from skilled therapeutic intervention in order to improve the following deficits and impairments:  Difficulty walking, Abnormal gait, Decreased coordination, Decreased range of motion, Cardiopulmonary status limiting activity, Decreased activity tolerance, Pain, Impaired flexibility, Decreased scar mobility, Decreased balance, Decreased mobility, Decreased strength, Increased edema ? ?Visit Diagnosis: ?Acute pain of left knee ? ?  Stiffness of left knee, not elsewhere classified ? ?Localized edema ? ?Difficulty in walking, not elsewhere classified ? ? ? ? ?Problem List ?Patient Active Problem List  ? Diagnosis Date Noted  ? Primary osteoarthritis of left knee 08/15/2021  ? Double vision 01/05/2021  ? S/P gastric bypass 04/05/2020  ? Need for shingles vaccine 01/28/2020  ? Foreskin problem 08/10/2019  ? HLD (hyperlipidemia) 08/05/2017  ? Routine general medical examination at a health care facility 07/13/2016  ? Advance care planning 07/13/2016  ? Type 2 diabetes mellitus with diabetic neuropathy, unspecified ([place]) 03/17/2016  ? Lower urinary tract symptoms (LUTS) 03/16/2016  ? Dysphagia   ? Essential hypertension   ? Hx of colonic polyps   ? Fatigue 08/04/2014  ? Rash and nonspecific skin eruption 10/21/2013  ? Morbid obesity with BMI of 50.0-59.9, adult ([place]) 05/24/2013  ? History of colonic polyps 04/29/2013  ? Anxiety state 03/10/2013  ? Pain in joint, shoulder region 02/08/2013  ? Sinus bradycardia /pauses   ? ED (erectile dysfunction) 02/21/2012  ? Edema 01/31/2012  ? Skin lesion 01/08/2012  ? OA (osteoarthritis) of knee 06/15/2011  ? Paresthesia 01/08/2011  ? Achalasia 08/17/2010  ? DEPRESSION 01/21/2010  ? Degenerative arthritis of left knee 01/21/2010  ? ANGINA, HX OF 01/21/2010  ? PACEMAKER, [employer] Judes 01/21/2010  ? BACK PAIN 01/05/2010  ?  Obesity 12/07/2009  ? GERD 12/07/2009  ? OSA (obstructive sleep apnea) 12/07/2009  ? ? ?Scot Jun, PTA ?09/02/2021, 11:42 AM ? ?[place] ?[place] ?[address] ?[place], [place],

## 2021-09-05 ENCOUNTER — Ambulatory Visit: Payer: BC Managed Care – PPO | Admitting: Physical Therapy

## 2021-09-05 ENCOUNTER — Encounter: Payer: Self-pay | Admitting: Physical Therapy

## 2021-09-05 DIAGNOSIS — M25562 Pain in left knee: Secondary | ICD-10-CM

## 2021-09-05 DIAGNOSIS — M25662 Stiffness of left knee, not elsewhere classified: Secondary | ICD-10-CM

## 2021-09-05 DIAGNOSIS — R262 Difficulty in walking, not elsewhere classified: Secondary | ICD-10-CM

## 2021-09-05 DIAGNOSIS — R600 Localized edema: Secondary | ICD-10-CM

## 2021-09-05 NOTE — Therapy (Signed)
[place] ?[place] ?[place] ?[place], [place], [address] ?Phone: [phone]   Fax:  [phone] ? ?Physical Therapy Treatment ? ?Patient Details  ?Name: Glenn Lyons ?MRN: [id number] ?Date of Birth: August 12, 1955 ?Referring Provider (PT): Glenn Lyons ? ? ?Encounter Date: 09/05/2021 ? ? PT End of Session - 09/05/21 0931   ? ? Visit Number 6   ? Date for PT Re-Evaluation 11/18/21   ? Authorization Type BCBS   ? PT Start Time 0848   ? PT Stop Time 0929   ? PT Time Calculation (min) 41 min   ? Activity Tolerance Patient tolerated treatment well   ? Behavior During Therapy [facility] for tasks assessed/performed   ? ?  ?  ? ?  ? ? ?Past Medical History:  ?Diagnosis Date  ? Achalasia   ? with prev eval at [facility].  No intervention as of 2012  ? Arthritis   ? Aspiration pneumonia ([place])   ? Backache, unspecified   ? Cardiac pacemaker St. Jude   ? ERI 2008  ? Depressive disorder, not elsewhere classified   ? Diabetes ([place])   ? Morbid obesity ([place])   ? Obstructive sleep apnea   ? biapap settign 25-22  ? Presence of permanent cardiac pacemaker   ? St. Jude- [place] Lyons follows -device Check 07-06-14  ? Sinus bradycardia /pauses   ? Stricture and stenosis of esophagus   ? ? ?Past Surgical History:  ?Procedure Laterality Date  ? BALLOON DILATION N/A 10/06/2015  ? Procedure: BALLOON DILATION;  Surgeon: Glenn Pole, MD;  Location: [facility] ENDOSCOPY;  Service: Endoscopy;  Laterality: N/A;  ? BIOPSY  05/06/2018  ? Procedure: BIOPSY;  Surgeon: Glenn Copas., MD;  Location: [place];  Service: Gastroenterology;;  ? COLONOSCOPY W/ POLYPECTOMY  11/01/2010  ? COLONOSCOPY WITH PROPOFOL N/A 10/05/2014  ? Procedure: COLONOSCOPY WITH PROPOFOL;  Surgeon: Glenn Mayer, MD;  [facility];  Service: Endoscopy;  Laterality: N/A;  ? COLONOSCOPY WITH PROPOFOL N/A 05/06/2018  ? Procedure: COLONOSCOPY WITH PROPOFOL;  Surgeon: Mansouraty, [place] Lyons., MD;  Location: [place];  Service:  Gastroenterology;  Laterality: N/A;  ? ESOPHAGEAL DILATION    ? ESOPHAGEAL MANOMETRY N/A 08/23/2015  ? Procedure: ESOPHAGEAL MANOMETRY (EM);  Surgeon: Glenn Pole, MD;  [facility];  Service: Endoscopy;  Laterality: N/A;  ? ESOPHAGOGASTRODUODENOSCOPY N/A 10/09/2014  ? Procedure: ESOPHAGOGASTRODUODENOSCOPY (EGD);  Surgeon: Glenn Mayer, MD;  [facility];  Service: Endoscopy;  Laterality: N/A;  ? ESOPHAGOGASTRODUODENOSCOPY (EGD) WITH PROPOFOL N/A 08/23/2015  ? Procedure: ESOPHAGOGASTRODUODENOSCOPY (EGD) WITH PROPOFOL;  Surgeon: Glenn Pole, MD;  [facility];  Service: Endoscopy;  Laterality: N/A;  ? ESOPHAGOGASTRODUODENOSCOPY (EGD) WITH PROPOFOL N/A 10/06/2015  ? Procedure: ESOPHAGOGASTRODUODENOSCOPY (EGD) WITH PROPOFOL;  Surgeon: Glenn Pole, MD;  Location: [facility]COPY;  Service: Endoscopy;  Laterality: N/A;  Rigiflex ballon size 43m-35mm size 45 minute proc,need Fluro ?Gastografin esophagram 2 hrs post EGD ?  ? GASTRIC ROUX-EN-Y N/A 04/05/2020  ? Procedure: LAPAROSCOPIC ROUX-EN-Y GASTRIC BYPASS WITH UPPER ENDOSCOPY;  Surgeon: WGreer Pickerel MD;  [facility];  Service: General;  Laterality: N/A;  ? PACEMAKER INSERTION    ? POLYPECTOMY  05/06/2018  ? Procedure: POLYPECTOMY;  Surgeon: MRush LandmarkGTelford Nab, MD;  [place] [facility]  Service: Gastroenterology;;  ? [place] 05/01/2018  ? Procedure: PPM GENERATOR CHANGEOUT;  Surgeon: KDeboraha Sprang MD;  [facility];  Service: Cardiovascular;  Laterality: N/A;  ?  TOTAL KNEE ARTHROPLASTY Left 08/15/2021  ? Procedure: TOTAL KNEE ARTHROPLASTY;  Surgeon: Gaynelle Arabian, MD;  [facility];  Service: Orthopedics;  Laterality: Left;  ? ? ?There were no vitals filed for this visit. ? ? Subjective Assessment - 09/05/21 0855   ? ? Subjective Doing OK, this is the earliest I've been in. Little more sore,I [place] a [place] of [place]   ? Patient Stated Goals walk, have good  ROM, get back to some work and travel   ? Currently in Pain? Yes   ? Pain Score 6    ? Pain Location Knee   ? Pain Orientation Left   ? Pain Descriptors / Indicators Aching;Sore   ? Pain Type Acute pain   ? ?  ?  ? ?  ? ? ? ? ? ? ? ? ? ? ? ? ? ? ? ? ? ? ? ? [place] Adult PT Treatment/Exercise - 09/05/21 0001   ? ?  ? Knee/Hip Exercises: Stretches  ? Active Hamstring Stretch Left;3 reps;30 seconds   ? Other Knee/Hip Stretches knee flexion stretch on steps 10x10 second holds   ?  ? Knee/Hip Exercises: Aerobic  ? Nustep L5 x 6 min   ?  ? Knee/Hip Exercises: Standing  ? Terminal Knee Extension Left;1 set;15 reps   ? Theraband Level (Terminal Knee Extension) Level 4 (Blue)   ? Forward Step Up Left;1 set;10 reps;Hand Hold: 1;Step Height: 4"   ?  ? Manual Therapy  ? Joint Mobilization patella mobs   ? Soft tissue mobilization scar mobility, quad IASTM   ? ?  ?  ? ?  ? ? ? ? ? ? ? ? ? ? PT Education - 09/05/21 0931   ? ? Education Details exerices form, purpose of new activities   ? Person(s) Educated Patient   ? Methods Explanation   ? Comprehension Verbalized understanding   ? ?  ?  ? ?  ? ? ? PT Short Term Goals - 08/18/21 1511   ? ?  ? PT SHORT TERM GOAL #1  ? Title indepednent with initial HEP   ? Time 2   ? Period Weeks   ? Status New   ? ?  ?  ? ?  ? ? ? ? PT Long Term Goals - 09/02/21 1139   ? ?  ? PT LONG TERM GOAL #1  ? Title understand RICE   ? Status Achieved   ? ?  ?  ? ?  ? ? ? ? ? ? ? ? Plan - 09/05/21 0931   ? ? Clinical Impression Statement Mr. Glenn Lyons arrives today doing well, little more sore than usual because he [place] a lot of [place]. Warmed up on Nustep and worked on ROM and CKC functional strength otherwise. Also spent some time working on soft tissue restrictions in the quad area as well. Will continue efforts.   ? Stability/Clinical Decision Making Evolving/Moderate complexity   ? Clinical Decision Making Low   ? Rehab Potential Good   ? PT Frequency 3x / week   ? PT Duration 12 weeks   ? PT  Treatment/Interventions ADLs/Self Care Home Management;Cryotherapy;Gait training;Stair training;Functional mobility training;Therapeutic activities;Therapeutic exercise;Balance training;Neuromuscular re-education;Manual techniques;Patient/family education;Vasopneumatic Device   ? PT Next Visit Plan progress walking, ROM and strength as tolerated   ? Consulted and Agree with Plan of Care Patient   ? ?  ?  ? ?  ? ? ?Patient will benefit  from skilled therapeutic intervention in order to improve the following deficits and impairments:  Difficulty walking, Abnormal gait, Decreased coordination, Decreased range of motion, Cardiopulmonary status limiting activity, Decreased activity tolerance, Pain, Impaired flexibility, Decreased scar mobility, Decreased balance, Decreased mobility, Decreased strength, Increased edema ? ?Visit Diagnosis: ?Acute pain of left knee ? ?Stiffness of left knee, not elsewhere classified ? ?Localized edema ? ?Difficulty in walking, not elsewhere classified ? ? ? ? ?Problem List ?Patient Active Problem List  ? Diagnosis Date Noted  ? Primary osteoarthritis of left knee 08/15/2021  ? Double vision 01/05/2021  ? S/P gastric bypass 04/05/2020  ? Need for shingles vaccine 01/28/2020  ? Foreskin problem 08/10/2019  ? HLD (hyperlipidemia) 08/05/2017  ? Routine general medical examination at a health care facility 07/13/2016  ? Advance care planning 07/13/2016  ? Type 2 diabetes mellitus with diabetic neuropathy, unspecified ([place]) 03/17/2016  ? Lower urinary tract symptoms (LUTS) 03/16/2016  ? Dysphagia   ? Essential hypertension   ? Hx of colonic polyps   ? Fatigue 08/04/2014  ? Rash and nonspecific skin eruption 10/21/2013  ? Morbid obesity with BMI of 50.0-59.9, adult ([place]) 05/24/2013  ? History of colonic polyps 04/29/2013  ? Anxiety state 03/10/2013  ? Pain in joint, shoulder region 02/08/2013  ? Sinus bradycardia /pauses   ? ED (erectile dysfunction) 02/21/2012  ? Edema 01/31/2012  ? Skin lesion  01/08/2012  ? OA (osteoarthritis) of knee 06/15/2011  ? Paresthesia 01/08/2011  ? Achalasia 08/17/2010  ? DEPRESSION 01/21/2010  ? Degenerative arthritis of left knee 01/21/2010  ? ANGINA, HX OF 01/21/2010  ? PACEMAKER

## 2021-09-07 ENCOUNTER — Ambulatory Visit: Payer: BC Managed Care – PPO | Admitting: Physical Therapy

## 2021-09-09 ENCOUNTER — Ambulatory Visit: Payer: BC Managed Care – PPO | Admitting: Physical Therapy

## 2021-09-12 ENCOUNTER — Ambulatory Visit: Payer: BC Managed Care – PPO | Admitting: Physical Therapy

## 2021-09-12 ENCOUNTER — Institutional Professional Consult (permissible substitution): Payer: BC Managed Care – PPO | Admitting: Pulmonary Disease

## 2021-09-14 ENCOUNTER — Encounter: Payer: Self-pay | Admitting: Physical Therapy

## 2021-09-14 ENCOUNTER — Ambulatory Visit: Payer: BC Managed Care – PPO | Admitting: Physical Therapy

## 2021-09-14 DIAGNOSIS — M25662 Stiffness of left knee, not elsewhere classified: Secondary | ICD-10-CM

## 2021-09-14 DIAGNOSIS — R262 Difficulty in walking, not elsewhere classified: Secondary | ICD-10-CM

## 2021-09-14 DIAGNOSIS — R600 Localized edema: Secondary | ICD-10-CM

## 2021-09-14 DIAGNOSIS — M25562 Pain in left knee: Secondary | ICD-10-CM

## 2021-09-14 NOTE — Therapy (Signed)
[place] [facility] ?[address] ?[place], [place], [address] ?Phone: [phone]   Fax:  [phone] ? ?Physical Therapy Treatment ? ?Patient Details  ?Name: Glenn Lyons ?MRN: [id number] ?Date of Birth: 1955/12/14 ?Referring Provider (PT): Aluisio ? ? ?Encounter Date: 09/14/2021 ? ? PT End of Session - 09/14/21 1344   ? ? Visit Number 7   ? Date for PT Re-Evaluation 11/18/21   ? Authorization Type BCBS   ? PT Start Time 1300   ? PT Stop Time 1342   ? PT Time Calculation (min) 42 min   ? Activity Tolerance Patient tolerated treatment well   ? Behavior During Therapy [facility] for tasks assessed/performed   ? ?  ?  ? ?  ? ? ?Past Medical History:  ?Diagnosis Date  ? Achalasia   ? with prev eval at [facility].  No intervention as of 2012  ? Arthritis   ? Aspiration pneumonia ([place])   ? Backache, unspecified   ? Cardiac pacemaker St. Jude   ? ERI 2008  ? Depressive disorder, not elsewhere classified   ? Diabetes ([place])   ? Morbid obesity ([place])   ? Obstructive sleep apnea   ? biapap settign 25-22  ? Presence of permanent cardiac pacemaker   ? St. Jude- Dr. Caryl Comes follows -device Check 07-06-14  ? Sinus bradycardia /pauses   ? Stricture and stenosis of esophagus   ? ? ?Past Surgical History:  ?Procedure Laterality Date  ? BALLOON DILATION N/A 10/06/2015  ? Procedure: BALLOON DILATION;  Surgeon: Mauri Pole, MD;  Location: [place] [facility]OSCOPY;  Service: Endoscopy;  Laterality: N/A;  ? BIOPSY  05/06/2018  ? Procedure: BIOPSY;  Surgeon: Irving Copas., MD;  Location: [facility];  Service: Gastroenterology;;  ? COLONOSCOPY W/ POLYPECTOMY  11/01/2010  ? COLONOSCOPY WITH PROPOFOL N/A 10/05/2014  ? Procedure: COLONOSCOPY WITH PROPOFOL;  Surgeon: Gatha Mayer, MD;  [facility];  Service: Endoscopy;  Laterality: N/A;  ? COLONOSCOPY WITH PROPOFOL N/A 05/06/2018  ? Procedure: COLONOSCOPY WITH PROPOFOL;  Surgeon: Mansouraty, Telford Nab., MD;  Location: [facility];  Service:  Gastroenterology;  Laterality: N/A;  ? ESOPHAGEAL DILATION    ? ESOPHAGEAL MANOMETRY N/A 08/23/2015  ? Procedure: ESOPHAGEAL MANOMETRY (EM);  Surgeon: Mauri Pole, MD;  [facility];  Service: Endoscopy;  Laterality: N/A;  ? ESOPHAGOGASTRODUODENOSCOPY N/A 10/09/2014  ? Procedure: ESOPHAGOGASTRODUODENOSCOPY (EGD);  Surgeon: Gatha Mayer, MD;  Location: [facility];  Service: Endoscopy;  Laterality: N/A;  ? ESOPHAGOGASTRODUODENOSCOPY (EGD) WITH PROPOFOL N/A 08/23/2015  ? Procedure: ESOPHAGOGASTRODUODENOSCOPY (EGD) WITH PROPOFOL;  Surgeon: Mauri Pole, MD;  [facility];  Service: Endoscopy;  Laterality: N/A;  ? ESOPHAGOGASTRODUODENOSCOPY (EGD) WITH PROPOFOL N/A 10/06/2015  ? Procedure: ESOPHAGOGASTRODUODENOSCOPY (EGD) WITH PROPOFOL;  Surgeon: Mauri Pole, MD;  Location: [facility]COPY;  Service: Endoscopy;  Laterality: N/A;  Rigiflex ballon size 61m-35mm size 45 minute proc,need Fluro ?Gastografin esophagram 2 hrs post EGD ?  ? GASTRIC ROUX-EN-Y N/A 04/05/2020  ? Procedure: LAPAROSCOPIC ROUX-EN-Y GASTRIC BYPASS WITH UPPER ENDOSCOPY;  Surgeon: WGreer Pickerel MD;  [facility];  Service: General;  Laterality: N/A;  ? PACEMAKER INSERTION    ? POLYPECTOMY  05/06/2018  ? Procedure: POLYPECTOMY;  Surgeon: MRush LandmarkGTelford Nab, MD;  Location: [facility]  Service: Gastroenterology;;  ? [place]/A 05/01/2018  ? Procedure: PPM GENERATOR CHANGEOUT;  Surgeon: KDeboraha Sprang MD;  [facility];  Service: Cardiovascular;  Laterality: N/A;  ?  TOTAL KNEE ARTHROPLASTY Left 08/15/2021  ? Procedure: TOTAL KNEE ARTHROPLASTY;  Surgeon: Gaynelle Arabian, MD;  [facility];  Service: Orthopedics;  Laterality: Left;  ? ? ?There were no vitals filed for this visit. ? ? Subjective Assessment - 09/14/21 1307   ? ? Subjective I've been in real bad pain and my knee has been swollen. I had to cancel some appointments I wasn't able to get out of the house until today. I  almost cancelled today. Last time we worked on the stairs and I don't think I've used some of those muscles in a long time. The pain after the stairs felt similar to the pain I had after surgery. I have similar pain in both knees now. I'm not sure why.   ? Limitations Lifting;Standing;Walking;House hold activities   ? Patient Stated Goals walk, have good ROM, get back to some work and travel   ? Currently in Pain? Yes   ? Pain Score 5    ? Pain Location Knee   ? Pain Orientation Left   ? Pain Descriptors / Indicators Aching;Constant;Dull;Sore   ? ?  ?  ? ?  ? ? ? ? ? ? ? ? ? ? ? ? ? ? ? ? ? ? ? ? [facility] Adult PT Treatment/Exercise - 09/14/21 0001   ? ?  ? Ambulation/Gait  ? Gait Comments gait w/ SPC 2 laps inside building. Lap inside building w/ no AD inside building.   ?  ? Knee/Hip Exercises: Stretches  ? Passive Hamstring Stretch Left;4 reps;20 seconds   ? Quad Stretch Left;4 reps;20 seconds   ?  ? Knee/Hip Exercises: Aerobic  ? Nustep L5 x 6 min   ?  ? Knee/Hip Exercises: Standing  ? Terminal Knee Extension AROM;Strengthening;Left;2 sets;10 reps   red TB  ?  ? Knee/Hip Exercises: Seated  ? Long Arc Quad AROM;Strengthening;Left;2 sets;10 reps   3#  ? Marching AROM   ? Hamstring Curl AROM;Strengthening;Left;2 sets;10 reps   1x10 on the R LE; red TB  ? Sit to Sand 1 set;10 reps;without UE support   ? ?  ?  ? ?  ? ? ? ? ? ? ? ? ? ? ? ? PT Short Term Goals - 08/18/21 1511   ? ?  ? PT SHORT TERM GOAL #1  ? Title indepednent with initial HEP   ? Time 2   ? Period Weeks   ? Status New   ? ?  ?  ? ?  ? ? ? ? PT Long Term Goals - 09/02/21 1139   ? ?  ? PT LONG TERM GOAL #1  ? Title understand RICE   ? Status Achieved   ? ?  ?  ? ?  ? ? ? ? ? ? ? ? Plan - 09/14/21 1449   ? ? Clinical Impression Statement Patient stated he had increased pain during last session doing step ups and was finally able to get out of the house to come today. Patient was compliant and did well overall. Focused today's session on strengthening and  stretching R knee. Walked inside building w/ SPC and w/o AD because due to patient stating he felt unstable the past few days but seemed to do okay, no LOB.   ? Stability/Clinical Decision Making Evolving/Moderate complexity   ? Clinical Decision Making Low   ? Rehab Potential Good   ? PT Frequency 3x / week   ? PT Duration 12 weeks   ?  PT Treatment/Interventions ADLs/Self Care Home Management;Cryotherapy;Gait training;Stair training;Functional mobility training;Therapeutic activities;Therapeutic exercise;Balance training;Neuromuscular re-education;Manual techniques;Patient/family education;Vasopneumatic Device   ? PT Next Visit Plan progress as tolerated   ? Consulted and Agree with Plan of Care Patient   ? ?  ?  ? ?  ? ? ?Patient will benefit from skilled therapeutic intervention in order to improve the following deficits and impairments:  Difficulty walking, Abnormal gait, Decreased coordination, Decreased range of motion, Cardiopulmonary status limiting activity, Decreased activity tolerance, Pain, Impaired flexibility, Decreased scar mobility, Decreased balance, Decreased mobility, Decreased strength, Increased edema ? ?Visit Diagnosis: ?Acute pain of left knee ? ?Stiffness of left knee, not elsewhere classified ? ?Localized edema ? ?Difficulty in walking, not elsewhere classified ? ? ? ? ?Problem List ?Patient Active Problem List  ? Diagnosis Date Noted  ? Primary osteoarthritis of left knee 08/15/2021  ? Double vision 01/05/2021  ? S/P gastric bypass 04/05/2020  ? Need for shingles vaccine 01/28/2020  ? Foreskin problem 08/10/2019  ? HLD (hyperlipidemia) 08/05/2017  ? Routine general medical examination at a health care facility 07/13/2016  ? Advance care planning 07/13/2016  ? Type 2 diabetes mellitus with diabetic neuropathy, unspecified ([place]) 03/17/2016  ? Lower urinary tract symptoms (LUTS) 03/16/2016  ? Dysphagia   ? Essential hypertension   ? Hx of colonic polyps   ? Fatigue 08/04/2014  ? Rash and  nonspecific skin eruption 10/21/2013  ? Morbid obesity with BMI of 50.0-59.9, adult ([place]) 05/24/2013  ? History of colonic polyps 04/29/2013  ? Anxiety state 03/10/2013  ? Pain in joint, shoulder region 02/08/2013  ? Sin

## 2021-09-15 ENCOUNTER — Ambulatory Visit (INDEPENDENT_AMBULATORY_CARE_PROVIDER_SITE_OTHER): Payer: BC Managed Care – PPO | Admitting: Family Medicine

## 2021-09-15 ENCOUNTER — Ambulatory Visit: Payer: Self-pay

## 2021-09-15 DIAGNOSIS — M1711 Unilateral primary osteoarthritis, right knee: Secondary | ICD-10-CM

## 2021-09-15 NOTE — Assessment & Plan Note (Signed)
Completed zilretta injection today  

## 2021-09-15 NOTE — Progress Notes (Signed)
?Glenn Lyons - 66 y.o. male MRN [id number]  Date of birth: 12/09/1955 ? ?SUBJECTIVE:  Including CC & ROS.  ?No chief complaint on file. ? ? ?Glenn Lyons is a 66 y.o. male that is  here for zilretta injection. ? ? ? ?Review of Systems ?See HPI  ? ?HISTORY: Past Medical, Surgical, Social, and Family History Reviewed & Updated per EMR.   ?Pertinent Historical Findings include: ? ?Past Medical History:  ?Diagnosis Date  ? Achalasia   ? with prev eval at [facility].  No intervention as of 2012  ? Arthritis   ? Aspiration pneumonia ([place])   ? Backache, unspecified   ? Cardiac pacemaker St. Jude   ? ERI 2008  ? Depressive disorder, not elsewhere classified   ? Diabetes ([place])   ? Morbid obesity ([place])   ? Obstructive sleep apnea   ? biapap settign 25-22  ? Presence of permanent cardiac pacemaker   ? St. Jude- Dr. Caryl Comes follows -device Check 07-06-14  ? Sinus bradycardia /pauses   ? Stricture and stenosis of esophagus   ? ? ?Past Surgical History:  ?Procedure Laterality Date  ? BALLOON DILATION N/A 10/06/2015  ? Procedure: BALLOON DILATION;  Surgeon: Mauri Pole, MD;  Location: [place] ENDOSCOPY;  Service: Endoscopy;  Laterality: N/A;  ? BIOPSY  05/06/2018  ? Procedure: BIOPSY;  Surgeon: Irving Copas., MD;  Location: [place];  Service: Gastroenterology;;  ? COLONOSCOPY W/ POLYPECTOMY  11/01/2010  ? COLONOSCOPY WITH PROPOFOL N/A 10/05/2014  ? Procedure: COLONOSCOPY WITH PROPOFOL;  Surgeon: Gatha Mayer, MD;  [facility];  Service: Endoscopy;  Laterality: N/A;  ? COLONOSCOPY WITH PROPOFOL N/A 05/06/2018  ? Procedure: COLONOSCOPY WITH PROPOFOL;  Surgeon: Mansouraty, Telford Nab., MD;  Location: [place];  Service: Gastroenterology;  Laterality: N/A;  ? ESOPHAGEAL DILATION    ? ESOPHAGEAL MANOMETRY N/A 08/23/2015  ? Procedure: ESOPHAGEAL MANOMETRY (EM);  Surgeon: Mauri Pole, MD;  [facility];  Service: Endoscopy;  Laterality: N/A;  ? ESOPHAGOGASTRODUODENOSCOPY N/A 10/09/2014  ? Procedure:  ESOPHAGOGASTRODUODENOSCOPY (EGD);  Surgeon: Gatha Mayer, MD;  [facility];  Service: Endoscopy;  Laterality: N/A;  ? ESOPHAGOGASTRODUODENOSCOPY (EGD) WITH PROPOFOL N/A 08/23/2015  ? Procedure: ESOPHAGOGASTRODUODENOSCOPY (EGD) WITH PROPOFOL;  Surgeon: Mauri Pole, MD;  [facility];  Service: Endoscopy;  Laterality: N/A;  ? ESOPHAGOGASTRODUODENOSCOPY (EGD) WITH PROPOFOL N/A 10/06/2015  ? Procedure: ESOPHAGOGASTRODUODENOSCOPY (EGD) WITH PROPOFOL;  Surgeon: Mauri Pole, MD;  Location: [facility]OSCOPY;  Service: Endoscopy;  Laterality: N/A;  Rigiflex ballon size 20m-35mm size 45 minute proc,need Fluro ?Gastografin esophagram 2 hrs post EGD ?  ? GASTRIC ROUX-EN-Y N/A 04/05/2020  ? Procedure: LAPAROSCOPIC ROUX-EN-Y GASTRIC BYPASS WITH UPPER ENDOSCOPY;  Surgeon: WGreer Pickerel MD;  [facility];  Service: General;  Laterality: N/A;  ? PACEMAKER INSERTION    ? POLYPECTOMY  05/06/2018  ? Procedure: POLYPECTOMY;  Surgeon: MRush LandmarkGTelford Nab, MD;  [facility] [facility]  Service: Gastroenterology;;  ? PDawnN/A 05/01/2018  ? Procedure: PPM GENERATOR CHANGEOUT;  Surgeon: KDeboraha Sprang MD;  [facility];  Service: Cardiovascular;  Laterality: N/A;  ? TOTAL KNEE ARTHROPLASTY Left 08/15/2021  ? Procedure: TOTAL KNEE ARTHROPLASTY;  Surgeon: AGaynelle Arabian MD;  [facility];  Service: Orthopedics;  Laterality: Left;  ? ? ? ?PHYSICAL EXAM:  ?VS: There were no vitals taken for this visit. ?Physical Exam ?Gen: NAD, alert, cooperative with exam, well-appearing ?MSK:  ?Neurovascularly intact   ? ? ?  Aspiration/Injection Procedure Note ?Macrae L Minasyan ?01-12-1956 ? ?Procedure: Injection ?Indications: right knee pain ? ?Procedure Details ?Consent: Risks of procedure as well as the alternatives and risks of each were explained to the (patient/caregiver).  Consent for procedure obtained. ?Time Out: Verified patient identification, verified procedure, site/side was  marked, verified correct patient position, special equipment/implants available, medications/allergies/relevent history reviewed, required imaging and test results available.  Performed.  The area was cleaned with iodine and alcohol swabs.   ? ?The right knee superior lateral suprapatellar pouch was injected using 3 cc of 1% lidocaine on a 25-gauge 1-1/2 inch needle.  An 18-gauge 1-1/2 needle was used to achieve aspiration.  The syringe was switched and a mixture containing 5 cc's of 32 mg Zilretta and 4 cc's of 0.25% bupivacaine was injected.  Ultrasound was used. Images were obtained in long views showing the injection.  ? ?Amount of Fluid Aspirated:  16m ?Character of Fluid: clear and straw colored ?Fluid was sent for: n/a ? ?A sterile dressing was applied. ? ?Patient did tolerate procedure well. ? ?  ? ? ? ?ASSESSMENT & PLAN:  ? ?OA (osteoarthritis) of knee ?Completed zilretta injection today.  ? ? ? ? ?

## 2021-09-16 ENCOUNTER — Encounter: Payer: Self-pay | Admitting: Internal Medicine

## 2021-09-16 ENCOUNTER — Ambulatory Visit: Payer: BC Managed Care – PPO | Admitting: Physical Therapy

## 2021-09-16 ENCOUNTER — Encounter: Payer: Self-pay | Admitting: Physical Therapy

## 2021-09-16 ENCOUNTER — Ambulatory Visit (INDEPENDENT_AMBULATORY_CARE_PROVIDER_SITE_OTHER): Payer: BC Managed Care – PPO | Admitting: Internal Medicine

## 2021-09-16 VITALS — BP 154/86 | HR 62 | Ht 72.0 in | Wt 315.6 lb

## 2021-09-16 DIAGNOSIS — M25562 Pain in left knee: Secondary | ICD-10-CM

## 2021-09-16 DIAGNOSIS — R262 Difficulty in walking, not elsewhere classified: Secondary | ICD-10-CM

## 2021-09-16 DIAGNOSIS — R001 Bradycardia, unspecified: Secondary | ICD-10-CM

## 2021-09-16 DIAGNOSIS — Z950 Presence of cardiac pacemaker: Secondary | ICD-10-CM

## 2021-09-16 DIAGNOSIS — M25662 Stiffness of left knee, not elsewhere classified: Secondary | ICD-10-CM

## 2021-09-16 NOTE — Progress Notes (Signed)
? ? ? ? ?Patient Care Team: ?Tonia Ghent, MD as PCP - General ?Deboraha Sprang, MD as PCP - Cardiology (Cardiology) ? ? ?HPI ? ?Glenn Lyons is a 66 y.o. male seen in follow-up for pacemaker Abbott implanted 2006, GEN change 2019 for sinus node dysfunction ? ?Date Cr K Hgb  ?3/23 0.79 4.9 14.9  ?      ? ? ?DATE TEST EF   ?10/18 Echo   60-65 % LVH mod  17/50m  ?6/21 Myoview  63% No ischemia  ?     ? ? ? ? ?   ? ?Past Medical History:  ?Diagnosis Date  ? [facility]alasia   ? [facility]  No intervention as of 2012  ? Arthritis   ? [place]   ? Backache, unspecified   ? Cardiac pacemaker St. Jude   ? ERI 2008  ? Depressive disorder, not elsewhere classified   ? Diabetes ([place]   ? Morbid obesity ([place]   ? Obstructive sleep apnea   ? biapap settign 25-22  ? Presence of permanent cardiac pacemaker   ? St. Jude- Dr. KCaryl Comesfollows -device Check 07-06-14  ? Sinus bradycardia /pauses   ? Stricture and stenosis of esophagus   ? ? ?Past Surgical History:  ?Procedure Laterality Date  ? BALLOON DILATION N/A 10/06/2015  ? Procedure: BALLOON DILATION;  Surgeon: KMauri Pole MD;  [facility];  Service: Endoscopy;  Laterality: N/A;  ? BIOPSY  05/06/2018  ? Procedure: BIOPSY;  Surgeon: MIrving Copas, MD;  [place] [facility]  Service: Gastroenterology;;  ? COLONOSCOPY W/ POLYPECTOMY  11/01/2010  ? COLONOSCOPY WITH PROPOFOL N/A 10/05/2014  ? Procedure: COLONOSCOPY WITH PROPOFOL;  Surgeon: CGatha Mayer MD;  [facility];  Service: Endoscopy;  Laterality: N/A;  ? COLONOSCOPY WITH PROPOFOL N/A 05/06/2018  ? Procedure: COLONOSCOPY WITH PROPOFOL;  Surgeon: Mansouraty, GTelford Nab, MD;  [facility]  Service: Gastroenterology;  Laterality: N/A;  ? ESOPHAGEAL DILATION    ? ESOPHAGEAL MANOMETRY N/A 08/23/2015  ? Procedure: ESOPHAGEAL MANOMETRY (EM);  Surgeon: KMauri Pole MD;  [facility];  Service: Endoscopy;  Laterality: N/A;  ?  ESOPHAGOGASTRODUODENOSCOPY N/A 10/09/2014  ? Procedure: ESOPHAGOGASTRODUODENOSCOPY (EGD);  Surgeon: CGatha Mayer MD;  [facility];  Service: Endoscopy;  Laterality: N/A;  ? ESOPHAGOGASTRODUODENOSCOPY (EGD) WITH PROPOFOL N/A 08/23/2015  ? Procedure: ESOPHAGOGASTRODUODENOSCOPY (EGD) WITH PROPOFOL;  Surgeon: KMauri Pole MD;  [facility];  Service: Endoscopy;  Laterality: N/A;  ? ESOPHAGOGASTRODUODENOSCOPY (EGD) WITH PROPOFOL N/A 10/06/2015  ? Procedure: ESOPHAGOGASTRODUODENOSCOPY (EGD) WITH PROPOFOL;  Surgeon: KMauri Pole MD;  [facility];  Service: Endoscopy;  Laterality: N/A;  Rigiflex ballon size 345m35mm size 45 minute proc,need Fluro ?Gastografin esophagram 2 hrs post EGD ?  ? GASTRIC ROUX-EN-Y N/A 04/05/2020  ? Procedure: LAPAROSCOPIC ROUX-EN-Y GASTRIC BYPASS WITH UPPER ENDOSCOPY;  Surgeon: WiGreer PickerelMD;  [facility];  Service: General;  Laterality: N/A;  ? PACEMAKER INSERTION    ? POLYPECTOMY  05/06/2018  ? Procedure: POLYPECTOMY;  Surgeon: MaRush LandmarkaTelford Nab MD;  [facility] Service: Gastroenterology;;  ? [place]/A 05/01/2018  ? Procedure: PPM GENERATOR CHANGEOUT;  Surgeon: KlDeboraha SprangMD;  [facility];  Service: Cardiovascular;  Laterality: N/A;  ? TOTAL KNEE ARTHROPLASTY Left 08/15/2021  ? Procedure: TOTAL KNEE ARTHROPLASTY;  Surgeon: AlGaynelle ArabianMD;  [facility];  Service: Orthopedics;  Laterality: Left;  ? ? ?  Current Outpatient Medications  ?Medication Sig Dispense Refill  ? acetaminophen (TYLENOL) 500 MG tablet Take 1,000 mg by mouth every 6 (six) hours as needed for moderate pain.    ? Aromatic Inhalants (VICKS VAPOR INHALER IN) Place 1 spray into both nostrils daily as needed (congestion).    ? Blood Glucose Monitoring Suppl (ONETOUCH VERIO) w/Device KIT Inject 1 kit into the skin every morning. Use to test blood sugar each day before breakfast and 2 hours after a meal for 2 weeks.   ?Diagnosis:  E11.9  Non-insulin dependent. 1 kit 0  ? gabapentin (NEURONTIN) 300 MG capsule Take a 300 mg capsule three times a day for two weeks following surgery.Then take a 300 mg capsule two times a day for two weeks. Then take a 300 mg capsule once a day for two weeks. Then discontinue. (Patient taking differently: 2 (two) times daily. Take a 300 mg capsule three times a day for two weeks following surgery.Then take a 300 mg capsule two times a day for two weeks. Then take a 300 mg capsule once a day for two weeks. Then discontinue.) 84 capsule 0  ? Lancets (ONETOUCH ULTRASOFT) lancets USE TO TEST BLOOD SUGAR ONCE DAILY OR AS NEEDED 100 each 3  ? methocarbamol (ROBAXIN) 500 MG tablet Take 1 tablet (500 mg total) by mouth every 6 (six) hours as needed for muscle spasms. 40 tablet 0  ? ONETOUCH VERIO test strip USE TO TEST BLOOD SUGAR ONCE DAILY OR AS INSTRUCTED 100 strip 3  ? oxyCODONE (OXY IR/ROXICODONE) 5 MG immediate release tablet Take 1-2 tablets (5-10 mg total) by mouth every 6 (six) hours as needed for severe pain. (Patient taking differently: Take 5-10 mg by mouth as needed for severe pain.) 42 tablet 0  ? traMADol (ULTRAM) 50 MG tablet Take 1-2 tablets (50-100 mg total) by mouth every 6 (six) hours as needed for moderate pain. (Patient taking differently: Take 50-100 mg by mouth every 6 (six) hours as needed for moderate pain. As needed) 40 tablet 0  ? ?No current facility-administered medications for this visit.  ? ? ?Allergies  ?Allergen Reactions  ? Morphine Other (See Comments)  ?  Cardiac Arrest  ? Metformin And Related   ?  Intolerant of any dose due to aches  ? ? ?Review of Systems negative except from HPI and PMH ? ?Physical Exam ?BP (!) 154/86   Pulse 62   Ht 6' (1.829 m)   Wt (!) 315 lb 9.6 oz (143.2 kg)   SpO2 95%   BMI 42.80 kg/m?  ?Well developed and well nourished in no acute distress ?HENT normal ?Neck supple   ?Clear ?Device pocket well healed; without hematoma or erythema.  There  is no tethering  ?Regular rate and rhythm, no murmur ?Abd-soft  ?No Clubbing cyanosis tr edema ?Skin-warm and dry ?A & Oriented  Grossly normal sensory and motor function ? ?ECG sinus at 62 ?Intervals 20/11/41 ? ?Assessment and  Plan ? ?Sinus node dysfunction ? ?Pacemaker-St. Jude ? ?Atrial lead impedance variability ? ?Sleep apnea ? ?Obesity- morbid--status post gastric bypass with 100 pound weight ?HFpEF ? ? ? ?Euvolemic and his diuretics have been held.   ? ?Blood pressure at home he says in the 120 range.  I have asked him to check it regularly and let us know.   ? ?Atrial lead impedance variability may be pertaining lead failure but at this point sensing and pacing parameters are fine.  We will follow that along. ? ?  He is chronotropically limited, but he is more orthopedically limited at this juncture so we will hold off on reprogramming. ? ?   ? ?  ?

## 2021-09-16 NOTE — Therapy (Signed)
[facility] ?[place], [place], [address] ?Phone: [phone]   Fax:  [phone] ? ?Physical Therapy Treatment ? ?Patient Details  ?Name: Glenn Lyons ?MRN: [id number] ?Date of Birth: Jul 05, 1955 ?Referring Provider (PT): Aluisio ? ? ?Encounter Date: 09/16/2021 ? ? PT End of Session - 09/16/21 1141   ? ? Visit Number 8   ? Date for PT Re-Evaluation 11/18/21   ? Authorization Type BCBS   ? PT Start Time 1100   ? PT Stop Time 5909   ? PT Time Calculation (min) 42 min   ? Activity Tolerance Patient tolerated treatment well   ? Behavior During Therapy [facility] for tasks assessed/performed   ? ?  ?  ? ?  ? ? ?Past Medical History:  ?Diagnosis Date  ? Achalasia   ? with prev eval at [facility].  No intervention as of 2012  ? Arthritis   ? Aspiration pneumonia ([place])   ? Backache, unspecified   ? Cardiac pacemaker St. Jude   ? ERI 2008  ? Depressive disorder, not elsewhere classified   ? Diabetes ([place])   ? Morbid obesity ([place])   ? Obstructive sleep apnea   ? biapap settign 25-22  ? Presence of permanent cardiac pacemaker   ? St. Jude- Dr. Caryl Comes follows -device Check 07-06-14  ? Sinus bradycardia /pauses   ? Stricture and stenosis of esophagus   ? ? ?Past Surgical History:  ?Procedure Laterality Date  ? BALLOON DILATION N/A 10/06/2015  ? Procedure: BALLOON DILATION;  Surgeon: Mauri Pole, MD;  Location: [place] [facility];  Service: Endoscopy;  Laterality: N/A;  ? BIOPSY  05/06/2018  ? Procedure: BIOPSY;  Surgeon: Irving Copas., MD;  Location: [facility];  Service: Gastroenterology;;  ? COLONOSCOPY W/ POLYPECTOMY  11/01/2010  ? COLONOSCOPY WITH PROPOFOL N/A 10/05/2014  ? Procedure: COLONOSCOPY WITH PROPOFOL;  Surgeon: Gatha Mayer, MD;  [facility];  Service: Endoscopy;  Laterality: N/A;  ? COLONOSCOPY WITH PROPOFOL N/A 05/06/2018  ? Procedure: COLONOSCOPY WITH PROPOFOL;  Surgeon: Mansouraty, Telford Nab., MD;  Location: [place];  Service:  Gastroenterology;  Laterality: N/A;  ? ESOPHAGEAL DILATION    ? ESOPHAGEAL MANOMETRY N/A 08/23/2015  ? Procedure: ESOPHAGEAL MANOMETRY (EM);  Surgeon: Mauri Pole, MD;  [facility];  Service: Endoscopy;  Laterality: N/A;  ? ESOPHAGOGASTRODUODENOSCOPY N/A 10/09/2014  ? Procedure: ESOPHAGOGASTRODUODENOSCOPY (EGD);  Surgeon: Gatha Mayer, MD;  [facility];  Service: Endoscopy;  Laterality: N/A;  ? ESOPHAGOGASTRODUODENOSCOPY (EGD) WITH PROPOFOL N/A 08/23/2015  ? Procedure: ESOPHAGOGASTRODUODENOSCOPY (EGD) WITH PROPOFOL;  Surgeon: Mauri Pole, MD;  [facility];  Service: Endoscopy;  Laterality: N/A;  ? ESOPHAGOGASTRODUODENOSCOPY (EGD) WITH PROPOFOL N/A 10/06/2015  ? Procedure: ESOPHAGOGASTRODUODENOSCOPY (EGD) WITH PROPOFOL;  Surgeon: Mauri Pole, MD;  Location: [facility] [facility];  Service: Endoscopy;  Laterality: N/A;  Rigiflex ballon size 50m-35mm size 45 minute proc,need Fluro ?Gastografin esophagram 2 hrs post EGD ?  ? GASTRIC ROUX-EN-Y N/A 04/05/2020  ? Procedure: LAPAROSCOPIC ROUX-EN-Y GASTRIC BYPASS WITH UPPER ENDOSCOPY;  Surgeon: WGreer Pickerel MD;  [facility];  Service: General;  Laterality: N/A;  ? PACEMAKER INSERTION    ? POLYPECTOMY  05/06/2018  ? Procedure: POLYPECTOMY;  Surgeon: MRush LandmarkGTelford Nab, MD;  [place]  Service: Gastroenterology;;  ? [place] 05/01/2018  ? Procedure: PPM GENERATOR CHANGEOUT;  Surgeon: KDeboraha Sprang MD;  [facility];  Service: Cardiovascular;  Laterality: N/A;  ?  TOTAL KNEE ARTHROPLASTY Left 08/15/2021  ? Procedure: TOTAL KNEE ARTHROPLASTY;  Surgeon: Gaynelle Arabian, MD;  [facility];  Service: Orthopedics;  Laterality: Left;  ? ? ?There were no vitals filed for this visit. ? ? Subjective Assessment - 09/16/21 1058   ? ? Subjective "I feel tons better than I did Wednesday" Had a zilreta shot in his R knee yesterday   ? Currently in Pain? No/denies   ? ?  ?  ? ?   ? ? ? ? ? ? ? ? ? ? ? ? ? ? ? ? ? ? ? ? [place] Adult PT Treatment/Exercise - 09/16/21 0001   ? ?  ? Knee/Hip Exercises: Stretches  ? Gastroc Stretch Both;20 seconds;10 seconds;3 reps   ?  ? Knee/Hip Exercises: Aerobic  ? Nustep L3 x 6 min LE only   ?  ? Knee/Hip Exercises: Machines for Strengthening  ? Cybex Knee Extension 10lb x15, LLE  5lb 2x10   ? Cybex Knee Flexion 35# x15 both legs, LLE 20lb 2x10   ?  ? Knee/Hip Exercises: Standing  ? Heel Raises Both;2 sets;15 reps   ? Forward Step Up Left;2 sets;10 reps;Step Height: 4";Hand Hold: 2   ? Walking with Sports Cord 30lb 4 way x 3 each   ?  ? Knee/Hip Exercises: Seated  ? Sit to Sand 2 sets;10 reps;without UE support   ? ?  ?  ? ?  ? ? ? ? ? ? ? ? ? ? ? ? PT Short Term Goals - 08/18/21 1511   ? ?  ? PT SHORT TERM GOAL #1  ? Title indepednent with initial HEP   ? Time 2   ? Period Weeks   ? Status New   ? ?  ?  ? ?  ? ? ? ? PT Long Term Goals - 09/16/21 1141   ? ?  ? PT LONG TERM GOAL #2  ? Title increase AROM of the left knee to 10-115 degrees   ? Status On-going   ?  ? PT LONG TERM GOAL #3  ? Title decrease TUG time to 25 seconds   ? Status On-going   ?  ? PT LONG TERM GOAL #4  ? Title walk with SPC or LRAD x 300 feet   ? Status Partially Met   ? ?  ?  ? ?  ? ? ? ? ? ? ? ? Plan - 09/16/21 1142   ? ? Clinical Impression Statement Pt enter feeling well and has progressed towards goals. Today's session focused on functional strength. Added resisted gait with good stability, did require cues for step length. Some LLE weakness present with step ups requiring the use of UEs. Pt did well with seated leg curls and extensions   ? Stability/Clinical Decision Making Evolving/Moderate complexity   ? Rehab Potential Good   ? PT Frequency 3x / week   ? PT Duration 12 weeks   ? PT Treatment/Interventions ADLs/Self Care Home Management;Cryotherapy;Gait training;Stair training;Functional mobility training;Therapeutic activities;Therapeutic exercise;Balance training;Neuromuscular  re-education;Manual techniques;Patient/family education;Vasopneumatic Device   ? PT Next Visit Plan progress as tolerated   ? ?  ?  ? ?  ? ? ?Patient will benefit from skilled therapeutic intervention in order to improve the following deficits and impairments:  Difficulty walking, Abnormal gait, Decreased coordination, Decreased range of motion, Cardiopulmonary status limiting activity, Decreased activity tolerance, Pain, Impaired flexibility, Decreased scar mobility, Decreased balance, Decreased mobility, Decreased strength, Increased edema ? ?Visit Diagnosis: ?  Acute pain of left knee ? ?Stiffness of left knee, not elsewhere classified ? ?Difficulty in walking, not elsewhere classified ? ? ? ? ?Problem List ?Patient Active Problem List  ? Diagnosis Date Noted  ? Primary osteoarthritis of left knee 08/15/2021  ? Double vision 01/05/2021  ? S/P gastric bypass 04/05/2020  ? Need for shingles vaccine 01/28/2020  ? Foreskin problem 08/10/2019  ? HLD (hyperlipidemia) 08/05/2017  ? Routine general medical examination at a health care facility 07/13/2016  ? Advance care planning 07/13/2016  ? Type 2 diabetes mellitus with diabetic neuropathy, unspecified ([place]) 03/17/2016  ? Lower urinary tract symptoms (LUTS) 03/16/2016  ? Dysphagia   ? Essential hypertension   ? Hx of colonic polyps   ? Fatigue 08/04/2014  ? Rash and nonspecific skin eruption 10/21/2013  ? Morbid obesity with BMI of 50.0-59.9, adult ([place]) 05/24/2013  ? History of colonic polyps 04/29/2013  ? Anxiety state 03/10/2013  ? Pain in joint, shoulder region 02/08/2013  ? Sinus bradycardia /pauses   ? ED (erectile dysfunction) 02/21/2012  ? Edema 01/31/2012  ? Skin lesion 01/08/2012  ? OA (osteoarthritis) of knee 06/15/2011  ? Paresthesia 01/08/2011  ? Achalasia 08/17/2010  ? DEPRESSION 01/21/2010  ? Degenerative arthritis of left knee 01/21/2010  ? ANGINA, HX OF 01/21/2010  ? PACEMAKER, [employer] Judes 01/21/2010  ? BACK PAIN 01/05/2010  ? Obesity 12/07/2009  ? GERD  12/07/2009  ? OSA (obstructive sleep apnea) 12/07/2009  ? ? ?Scot Jun, PTA ?09/16/2021, 11:46 AM ? ?[facility] [address] ?[address] ?[place], [place], [address] ?Phone: [phone]

## 2021-09-16 NOTE — Patient Instructions (Signed)
Medication Instructions:  ?Your physician recommends that you continue on your current medications as directed. Please refer to the Current Medication list given to you today. ? ?*If you need a refill on your cardiac medications before your next appointment, please call your pharmacy* ? ? ?Lab Work: ?None ordered. ? ?If you have labs (blood work) drawn today and your tests are completely normal, you will receive your results only by: ?MyChart Message (if you have MyChart) OR ?A paper copy in the mail ?If you have any lab test that is abnormal or we need to change your treatment, we will call you to review the results. ? ? ?Testing/Procedures: ?None ordered. ? ? ? ?Follow-Up: ?At [facility], you and your health needs are our priority.  As part of our continuing mission to provide you with exceptional heart care, we have created designated Provider Care Teams.  These Care Teams include your primary Cardiologist (physician) and Advanced Practice Providers (APPs -  Physician Assistants and Nurse Practitioners) who all work together to provide you with the care you need, when you need it. ? ?We recommend signing up for the patient portal called "MyChart".  Sign up information is provided on this After Visit Summary.  MyChart is used to connect with patients for Virtual Visits (Telemedicine).  Patients are able to view lab/test results, encounter notes, upcoming appointments, etc.  Non-urgent messages can be sent to your provider as well.   ?To learn more about what you can do with MyChart, go to [email or web address].   ? ?Your next appointment:   ?12 month(s) ? ?The format for your next appointment:   ?In Person ? ?Provider:   ?Virl Axe, MD{ ? ?Important Information About [facility] ? ? ? ? ?  ?

## 2021-09-19 ENCOUNTER — Encounter: Payer: Self-pay | Admitting: Physical Therapy

## 2021-09-19 ENCOUNTER — Ambulatory Visit: Payer: BC Managed Care – PPO | Admitting: Physical Therapy

## 2021-09-19 DIAGNOSIS — R262 Difficulty in walking, not elsewhere classified: Secondary | ICD-10-CM

## 2021-09-19 DIAGNOSIS — R600 Localized edema: Secondary | ICD-10-CM

## 2021-09-19 DIAGNOSIS — M25562 Pain in left knee: Secondary | ICD-10-CM

## 2021-09-19 DIAGNOSIS — M25662 Stiffness of left knee, not elsewhere classified: Secondary | ICD-10-CM

## 2021-09-19 NOTE — Therapy (Signed)
[facility] ?[address] ?[address] ?[place], [place], [address] ?Phone: [phone]   Fax:  [phone] ? ?Physical Therapy Treatment ? ?Patient Details  ?Name: Glenn Lyons ?MRN: [id number] ?Date of Birth: 31-Jan-1956 ?Referring Provider (PT): Aluisio ? ? ?Encounter Date: 09/19/2021 ? ? PT End of Session - 09/19/21 1357   ? ? Visit Number 9   ? Date for PT Re-Evaluation 11/18/21   ? Authorization Type BCBS   ? PT Start Time 1318   ? PT Stop Time 3235   ? PT Time Calculation (min) 39 min   ? Activity Tolerance Patient tolerated treatment well   ? Behavior During Therapy [facility] for tasks assessed/performed   ? ?  ?  ? ?  ? ? ?Past Medical History:  ?Diagnosis Date  ? Achalasia   ? with prev eval at [facility].  No intervention as of 2012  ? Arthritis   ? Aspiration pneumonia ([place])   ? Backache, unspecified   ? Cardiac pacemaker St. Jude   ? ERI 2008  ? Depressive disorder, not elsewhere classified   ? Diabetes ([place])   ? Morbid obesity ([employer])   ? Obstructive sleep apnea   ? biapap settign 25-22  ? Presence of permanent cardiac pacemaker   ? St. Jude- Dr. Caryl Comes follows -device Check 07-06-14  ? Sinus bradycardia /pauses   ? Stricture and stenosis of esophagus   ? ? ?Past Surgical History:  ?Procedure Laterality Date  ? BALLOON DILATION N/A 10/06/2015  ? Procedure: BALLOON DILATION;  Surgeon: Mauri Pole, MD;  Location: [place] [facility]COPY;  Service: Endoscopy;  Laterality: N/A;  ? BIOPSY  05/06/2018  ? Procedure: BIOPSY;  Surgeon: Irving Copas., MD;  Location: [place];  Service: Gastroenterology;;  ? COLONOSCOPY W/ POLYPECTOMY  11/01/2010  ? COLONOSCOPY WITH PROPOFOL N/A 10/05/2014  ? Procedure: COLONOSCOPY WITH PROPOFOL;  Surgeon: Gatha Mayer, MD;  [facility];  Service: Endoscopy;  Laterality: N/A;  ? COLONOSCOPY WITH PROPOFOL N/A 05/06/2018  ? Procedure: COLONOSCOPY WITH PROPOFOL;  Surgeon: Mansouraty, Telford Nab., MD;  Location: [facility];  Service:  Gastroenterology;  Laterality: N/A;  ? ESOPHAGEAL DILATION    ? ESOPHAGEAL MANOMETRY N/A 08/23/2015  ? Procedure: ESOPHAGEAL MANOMETRY (EM);  Surgeon: Mauri Pole, MD;  [facility];  Service: Endoscopy;  Laterality: N/A;  ? ESOPHAGOGASTRODUODENOSCOPY N/A 10/09/2014  ? Procedure: ESOPHAGOGASTRODUODENOSCOPY (EGD);  Surgeon: Gatha Mayer, MD;  [facility];  Service: Endoscopy;  Laterality: N/A;  ? ESOPHAGOGASTRODUODENOSCOPY (EGD) WITH PROPOFOL N/A 08/23/2015  ? Procedure: ESOPHAGOGASTRODUODENOSCOPY (EGD) WITH PROPOFOL;  Surgeon: Mauri Pole, MD;  [facility];  Service: Endoscopy;  Laterality: N/A;  ? ESOPHAGOGASTRODUODENOSCOPY (EGD) WITH PROPOFOL N/A 10/06/2015  ? Procedure: ESOPHAGOGASTRODUODENOSCOPY (EGD) WITH PROPOFOL;  Surgeon: Mauri Pole, MD;  Location: [place] [facility];  Service: Endoscopy;  Laterality: N/A;  Rigiflex ballon size 75m-35mm size 45 minute proc,need Fluro ?Gastografin esophagram 2 hrs post EGD ?  ? GASTRIC ROUX-EN-Y N/A 04/05/2020  ? Procedure: LAPAROSCOPIC ROUX-EN-Y GASTRIC BYPASS WITH UPPER ENDOSCOPY;  Surgeon: WGreer Pickerel MD;  [facility];  Service: General;  Laterality: N/A;  ? PACEMAKER INSERTION    ? POLYPECTOMY  05/06/2018  ? Procedure: POLYPECTOMY;  Surgeon: MRush LandmarkGTelford Nab, MD;  [place]  Service: Gastroenterology;;  ? [place]A 05/01/2018  ? Procedure: PPM GENERATOR CHANGEOUT;  Surgeon: KDeboraha Sprang MD;  [facility];  Service: Cardiovascular;  Laterality: N/A;  ?  TOTAL KNEE ARTHROPLASTY Left 08/15/2021  ? Procedure: TOTAL KNEE ARTHROPLASTY;  Surgeon: Gaynelle Arabian, MD;  [facility];  Service: Orthopedics;  Laterality: Left;  ? ? ?There were no vitals filed for this visit. ? ? Subjective Assessment - 09/19/21 1320   ? ? Subjective The stuff you made me do last time made my knee really painful, I think it was just that its been so long since I've done a lot on it that that  caused it. I'm still icing the heck out of it.   ? Pertinent History pacemaker   ? Patient Stated Goals walk, have good ROM, get back to some work and travel   ? Currently in Pain? Yes   ? Pain Score 5    ? Pain Location Knee   ? Pain Orientation Left   ? Pain Descriptors / Indicators Tightness   ? ?  ?  ? ?  ? ? ? ? ? ? ? ? ? ? ? ? ? ? ? ? ? ? ? ? [place] Adult PT Treatment/Exercise - 09/19/21 0001   ? ?  ? Knee/Hip Exercises: Stretches  ? Active Hamstring Stretch Left;3 reps;30 seconds   ? Knee: Self-Stretch to increase Flexion Left;10 seconds   ? Knee: Self-Stretch Limitations x10   ?  ? Knee/Hip Exercises: Aerobic  ? Nustep L4 x 6 min LE only   ?  ? Knee/Hip Exercises: Standing  ? Heel Raises Both;1 set;20 reps   ? Knee Flexion Left;1 set;10 reps   ? Knee Flexion Limitations 2 second holds   ? Lateral Step Up Left;1 set;10 reps   ? Lateral Step Up Limitations 4 inch step UUE support   ? Forward Step Up Left;1 set;15 reps   ? Functional Squat 1 set;10 reps   ? Functional Squat Limitations mini-squat   mod cues for form  ?  ? Manual Therapy  ? Manual Therapy Soft tissue mobilization   ? Soft tissue mobilization quad IASTM   ? ?  ?  ? ?  ? ? ? ? ? ? ? ? ? ? PT Education - 09/19/21 1357   ? ? Education Details exericse form   ? Person(s) Educated Patient   ? Methods Explanation   ? Comprehension Verbalized understanding   ? ?  ?  ? ?  ? ? ? PT Short Term Goals - 08/18/21 1511   ? ?  ? PT SHORT TERM GOAL #1  ? Title indepednent with initial HEP   ? Time 2   ? Period Weeks   ? Status New   ? ?  ?  ? ?  ? ? ? ? PT Long Term Goals - 09/16/21 1141   ? ?  ? PT LONG TERM GOAL #2  ? Title increase AROM of the left knee to 10-115 degrees   ? Status On-going   ?  ? PT LONG TERM GOAL #3  ? Title decrease TUG time to 25 seconds   ? Status On-going   ?  ? PT LONG TERM GOAL #4  ? Title walk with SPC or LRAD x 300 feet   ? Status Partially Met   ? ?  ?  ? ?  ? ? ? ? ? ? ? ? Plan - 09/19/21 1357   ? ? Clinical Impression Statement Glenn Lyons arrives today doing ok, tells me he had a lot of pain after last time I saw him, but also tells me he had  missed some pain medication doses around that time as well waiting for refill to be delivered. After reviewing what I did with him last time, I think maybe missing the pain meds may have had a bigger role here than anything we did in therapy. Continued working on [employer], otherwise progressed as able and tolerated today. Will continue efforts.   ? Stability/Clinical Decision Making Evolving/Moderate complexity   ? Clinical Decision Making Low   ? Rehab Potential Good   ? PT Frequency 3x / week   ? PT Duration 12 weeks   ? PT Treatment/Interventions ADLs/Self Care Home Management;Cryotherapy;Gait training;Stair training;Functional mobility training;Therapeutic activities;Therapeutic exercise;Balance training;Neuromuscular re-education;Manual techniques;Patient/family education;Vasopneumatic Device   ? PT Next Visit Plan progress as tolerated   ? Consulted and Agree with Plan of Care Patient   ? ?  ?  ? ?  ? ? ?Patient will benefit from skilled therapeutic intervention in order to improve the following deficits and impairments:  Difficulty walking, Abnormal gait, Decreased coordination, Decreased range of motion, Cardiopulmonary status limiting activity, Decreased activity tolerance, Pain, Impaired flexibility, Decreased scar mobility, Decreased balance, Decreased mobility, Decreased strength, Increased edema ? ?Visit Diagnosis: ?Acute pain of left knee ? ?Difficulty in walking, not elsewhere classified ? ?Stiffness of left knee, not elsewhere classified ? ?Localized edema ? ? ? ? ?Problem List ?Patient Active Problem List  ? Diagnosis Date Noted  ? Primary osteoarthritis of left knee 08/15/2021  ? Double vision 01/05/2021  ? S/P gastric bypass 04/05/2020  ? Need for shingles vaccine 01/28/2020  ? Foreskin problem 08/10/2019  ? HLD (hyperlipidemia) 08/05/2017  ? Routine general medical examination at a  health care facility 07/13/2016  ? Advance care planning 07/13/2016  ? Type 2 diabetes mellitus with diabetic neuropathy, unspecified ([place]) 03/17/2016  ? Lower urinary tract symptoms (LUTS) 03/16/2016  ? Dysphagi

## 2021-09-21 ENCOUNTER — Ambulatory Visit: Payer: BC Managed Care – PPO | Admitting: Physical Therapy

## 2021-09-21 ENCOUNTER — Encounter: Payer: Self-pay | Admitting: Physical Therapy

## 2021-09-21 DIAGNOSIS — M25662 Stiffness of left knee, not elsewhere classified: Secondary | ICD-10-CM

## 2021-09-21 DIAGNOSIS — R262 Difficulty in walking, not elsewhere classified: Secondary | ICD-10-CM

## 2021-09-21 DIAGNOSIS — M25562 Pain in left knee: Secondary | ICD-10-CM | POA: Diagnosis not present

## 2021-09-21 DIAGNOSIS — R600 Localized edema: Secondary | ICD-10-CM

## 2021-09-21 NOTE — Therapy (Signed)
[facility] ?[place] ?[place], [place], [address] ?Phone: [phone]   Fax:  [phone] ? ?Physical Therapy Treatment ?Progress Note ?Reporting Period 08/15/21 to 09/21/21 ? ?See note below for Objective Data and Assessment of Progress/Goals.  ? ?  ?Patient Details  ?Name: Glenn Lyons ?MRN: [id number] ?Date of Birth: 1956/01/16 ?Referring Provider (PT): Aluisio ? ? ?Encounter Date: 09/21/2021 ? ? PT End of Session - 09/21/21 1141   ? ? Visit Number 10   ? PT Start Time 1103   ? PT Stop Time 1324   ? PT Time Calculation (min) 39 min   ? Activity Tolerance Patient tolerated treatment well   ? Behavior During Therapy [facility] for tasks assessed/performed   ? ?  ?  ? ?  ? ? ?Past Medical History:  ?Diagnosis Date  ? Achalasia   ? with prev eval at [facility].  No intervention as of 2012  ? Arthritis   ? Aspiration pneumonia ([place])   ? Backache, unspecified   ? Cardiac pacemaker St. Jude   ? ERI 2008  ? Depressive disorder, not elsewhere classified   ? Diabetes ([place])   ? Morbid obesity ([place])   ? Obstructive sleep apnea   ? biapap settign 25-22  ? Presence of permanent cardiac pacemaker   ? St. Jude- Dr. Caryl Comes follows -device Check 07-06-14  ? Sinus bradycardia /pauses   ? Stricture and stenosis of esophagus   ? ? ?Past Surgical History:  ?Procedure Laterality Date  ? BALLOON DILATION N/A 10/06/2015  ? Procedure: BALLOON DILATION;  Surgeon: Mauri Pole, MD;  Location: [facility];  Service: Endoscopy;  Laterality: N/A;  ? BIOPSY  05/06/2018  ? Procedure: BIOPSY;  Surgeon: Irving Copas., MD;  Location: [facility];  Service: Gastroenterology;;  ? COLONOSCOPY W/ POLYPECTOMY  11/01/2010  ? COLONOSCOPY WITH PROPOFOL N/A 10/05/2014  ? Procedure: COLONOSCOPY WITH PROPOFOL;  Surgeon: Gatha Mayer, MD;  [facility];  Service: Endoscopy;  Laterality: N/A;  ? COLONOSCOPY WITH PROPOFOL N/A 05/06/2018  ? Procedure: COLONOSCOPY WITH PROPOFOL;  Surgeon: Mansouraty,  Telford Nab., MD;  Location: [facility];  Service: Gastroenterology;  Laterality: N/A;  ? ESOPHAGEAL DILATION    ? ESOPHAGEAL MANOMETRY N/A 08/23/2015  ? Procedure: ESOPHAGEAL MANOMETRY (EM);  Surgeon: Mauri Pole, MD;  [facility];  Service: Endoscopy;  Laterality: N/A;  ? ESOPHAGOGASTRODUODENOSCOPY N/A 10/09/2014  ? Procedure: ESOPHAGOGASTRODUODENOSCOPY (EGD);  Surgeon: Gatha Mayer, MD;  Location: [facility];  Service: Endoscopy;  Laterality: N/A;  ? ESOPHAGOGASTRODUODENOSCOPY (EGD) WITH PROPOFOL N/A 08/23/2015  ? Procedure: ESOPHAGOGASTRODUODENOSCOPY (EGD) WITH PROPOFOL;  Surgeon: Mauri Pole, MD;  [facility];  Service: Endoscopy;  Laterality: N/A;  ? ESOPHAGOGASTRODUODENOSCOPY (EGD) WITH PROPOFOL N/A 10/06/2015  ? Procedure: ESOPHAGOGASTRODUODENOSCOPY (EGD) WITH PROPOFOL;  Surgeon: Mauri Pole, MD;  Location: [facility];  Service: Endoscopy;  Laterality: N/A;  Rigiflex ballon size 59m-35mm size 45 minute proc,need Fluro ?Gastografin esophagram 2 hrs post EGD ?  ? GASTRIC ROUX-EN-Y N/A 04/05/2020  ? Procedure: LAPAROSCOPIC ROUX-EN-Y GASTRIC BYPASS WITH UPPER ENDOSCOPY;  Surgeon: WGreer Pickerel MD;  [facility];  Service: General;  Laterality: N/A;  ? PACEMAKER INSERTION    ? POLYPECTOMY  05/06/2018  ? Procedure: POLYPECTOMY;  Surgeon: MRush LandmarkGTelford Nab, MD;  [place] [facility]  Service: Gastroenterology;;  ? [place]/A 05/01/2018  ? Procedure: PPM GENERATOR CHANGEOUT;  Surgeon: KDeboraha Sprang MD;  [place];  Service: Cardiovascular;  Laterality: N/A;  ? TOTAL KNEE ARTHROPLASTY Left 08/15/2021  ? Procedure: TOTAL KNEE ARTHROPLASTY;  Surgeon: Gaynelle Arabian, MD;  [facility];  Service: Orthopedics;  Laterality: Left;  ? ? ?There were no vitals filed for this visit. ? ? Subjective Assessment - 09/21/21 1107   ? ? Subjective Patient reports he went back tot he surgeon's office and the PA was pleased with his  progress. He was sore again after last treatment, but felt better the next day.   ? Pertinent History pacemaker   ? Limitations Lifting;Standing;Walking;House hold activities   ? Patient Stated Goals walk, have good ROM, get back to some work and travel   ? Currently in Pain? Yes   ? Pain Score 5    ? Pain Location Calf   ? Pain Orientation Left;Lateral;Proximal   ? Pain Descriptors / Indicators Tightness   ? Pain Type Acute pain   ? Pain Onset More than a month ago   ? Pain Frequency Intermittent   ? ?  ?  ? ?  ? ? ? ? ? [facility] PT Assessment - 09/21/21 0001   ? ?  ? PROM  ? Left Knee Extension 15   ? Left Knee Flexion 113   AROM  ?  ? Timed Up and Go Test  ? TUG Normal TUG   ? ?  ?  ? ?  ? ? ? ? ? ? ? ? ? ? ? ? ? ? ? ? [facility] Adult PT Treatment/Exercise - 09/21/21 0001   ? ?  ? Knee/Hip Exercises: Aerobic  ? Recumbent Bike L4 x 6 minutes   ?  ? Knee/Hip Exercises: Machines for Strengthening  ? Cybex Knee Flexion 35# x15 both legs, LLE 20lb 2x10   ?  ? Knee/Hip Exercises: Standing  ? Forward Step Up Left;1 set;10 reps;Hand Hold: 1;Step Height: 8"   Paitent had difficulty with the 8" step.  ?  ? Knee/Hip Exercises: Supine  ? Straight Leg Raises Strengthening;Left;1 set;10 reps   ? Straight Leg Raise with External Rotation Strengthening;Left;1 set;10 reps   ? ?  ?  ? ?  ? ? ? ? ? ? ? ? ? ? ? ? PT Short Term Goals - 09/21/21 1124   ? ?  ? PT SHORT TERM GOAL #1  ? Title indepednent with initial HEP   ? Time 2   ? Period Weeks   ? Status Achieved   ? ?  ?  ? ?  ? ? ? ? PT Long Term Goals - 09/21/21 1124   ? ?  ? PT LONG TERM GOAL #2  ? Title increase AROM of the left knee to 10-115 degrees   ? Baseline 15-113   ? Time 8   ? Period Weeks   ? Status On-going   ?  ? PT LONG TERM GOAL #3  ? Title decrease TUG time to 25 seconds   ? Baseline 9.16   ? Time 8   ? Period Weeks   ? Status Achieved   ?  ? PT LONG TERM GOAL #4  ? Title walk with SPC or LRAD x 300 feet   ? Status Partially Met   ?  ? PT LONG TERM GOAL #5  ? Title sit to  stand without use of the UE's   ? Status Achieved   ? ?  ?  ? ?  ? ? ? ? ? ? ? ? Plan - 09/21/21  1136   ? ? Clinical Impression Statement Patient returnewd from F/U Dr appointment, states that the PA was pleased with his progress. Re-assessed his status, he has progressed in all areas, with strength, ROM, and functional mobility, appears to be on track for his recovery.Treatment focused on continuing to progress his POC.   ? Stability/Clinical Decision Making Evolving/Moderate complexity   ? Clinical Decision Making Low   ? Rehab Potential Good   ? PT Frequency 3x / week   ? PT Duration 8 weeks   ? PT Treatment/Interventions ADLs/Self Care Home Management;Cryotherapy;Gait training;Stair training;Functional mobility training;Therapeutic activities;Therapeutic exercise;Balance training;Neuromuscular re-education;Manual techniques;Patient/family education;Vasopneumatic Device   ? PT Next Visit Plan progress as tolerated   ? Consulted and Agree with Plan of Care Patient   ? ?  ?  ? ?  ? ? ?Patient will benefit from skilled therapeutic intervention in order to improve the following deficits and impairments:  Difficulty walking, Abnormal gait, Decreased coordination, Decreased range of motion, Cardiopulmonary status limiting activity, Decreased activity tolerance, Pain, Impaired flexibility, Decreased scar mobility, Decreased balance, Decreased mobility, Decreased strength, Increased edema ? ?Visit Diagnosis: ?Acute pain of left knee ? ?Difficulty in walking, not elsewhere classified ? ?Stiffness of left knee, not elsewhere classified ? ?Localized edema ? ? ? ? ?Problem List ?Patient Active Problem List  ? Diagnosis Date Noted  ? Primary osteoarthritis of left knee 08/15/2021  ? Double vision 01/05/2021  ? S/P gastric bypass 04/05/2020  ? Need for shingles vaccine 01/28/2020  ? Foreskin problem 08/10/2019  ? HLD (hyperlipidemia) 08/05/2017  ? Routine general medical examination at a health care facility 07/13/2016  ?  Advance care planning 07/13/2016  ? Type 2 diabetes mellitus with diabetic neuropathy, unspecified ([place]) 03/17/2016  ? Lower urinary tract symptoms (LUTS) 03/16/2016  ? Dysphagia   ? Essential hypertension   ? Hx

## 2021-09-23 ENCOUNTER — Ambulatory Visit: Payer: BC Managed Care – PPO | Admitting: Physical Therapy

## 2021-09-26 ENCOUNTER — Ambulatory Visit: Payer: BC Managed Care – PPO | Attending: Orthopedic Surgery | Admitting: Physical Therapy

## 2021-09-26 ENCOUNTER — Encounter: Payer: Self-pay | Admitting: Physical Therapy

## 2021-09-26 DIAGNOSIS — R262 Difficulty in walking, not elsewhere classified: Secondary | ICD-10-CM | POA: Diagnosis present

## 2021-09-26 DIAGNOSIS — R600 Localized edema: Secondary | ICD-10-CM | POA: Insufficient documentation

## 2021-09-26 DIAGNOSIS — M25562 Pain in left knee: Secondary | ICD-10-CM | POA: Insufficient documentation

## 2021-09-26 DIAGNOSIS — M25662 Stiffness of left knee, not elsewhere classified: Secondary | ICD-10-CM | POA: Diagnosis present

## 2021-09-26 NOTE — Therapy (Signed)
[facility] ?[place] ?[address] ?[place], [place], [address] ?Phone: [phone]   Fax:  [phone] ? ?Physical Therapy Treatment ? ?Patient Details  ?Name: Glenn Lyons ?MRN: [id number] ?Date of Birth: Feb 14, 1956 ?Referring Provider (PT): Aluisio ? ? ?Encounter Date: 09/26/2021 ? ? PT End of Session - 09/26/21 1144   ? ? Visit Number 11   ? Date for PT Re-Evaluation 11/18/21   ? Authorization Type BCBS   ? PT Start Time 1100   ? PT Stop Time 1141   ? PT Time Calculation (min) 41 min   ? Activity Tolerance Patient tolerated treatment well   ? Behavior During Therapy [facility] for tasks assessed/performed   ? ?  ?  ? ?  ? ? ?Past Medical History:  ?Diagnosis Date  ? Achalasia   ? with prev eval at [facility].  No intervention as of 2012  ? Arthritis   ? Aspiration pneumonia ([place])   ? Backache, unspecified   ? Cardiac pacemaker St. Jude   ? ERI 2008  ? Depressive disorder, not elsewhere classified   ? Diabetes ([place])   ? Morbid obesity ([place])   ? Obstructive sleep apnea   ? biapap settign 25-22  ? Presence of permanent cardiac pacemaker   ? St. Jude- Dr. Caryl Comes follows -device Check 07-06-14  ? Sinus bradycardia /pauses   ? Stricture and stenosis of esophagus   ? ? ?Past Surgical History:  ?Procedure Laterality Date  ? BALLOON DILATION N/A 10/06/2015  ? Procedure: BALLOON DILATION;  Surgeon: Mauri Pole, MD;  Location: [facility];  Service: Endoscopy;  Laterality: N/A;  ? BIOPSY  05/06/2018  ? Procedure: BIOPSY;  Surgeon: Irving Copas., MD;  Location: [facility];  Service: Gastroenterology;;  ? COLONOSCOPY W/ POLYPECTOMY  11/01/2010  ? COLONOSCOPY WITH PROPOFOL N/A 10/05/2014  ? Procedure: COLONOSCOPY WITH PROPOFOL;  Surgeon: Gatha Mayer, MD;  [facility];  Service: Endoscopy;  Laterality: N/A;  ? COLONOSCOPY WITH PROPOFOL N/A 05/06/2018  ? Procedure: COLONOSCOPY WITH PROPOFOL;  Surgeon: Mansouraty, Telford Nab., MD;  Location: [facility];  Service:  Gastroenterology;  Laterality: N/A;  ? ESOPHAGEAL DILATION    ? ESOPHAGEAL MANOMETRY N/A 08/23/2015  ? Procedure: ESOPHAGEAL MANOMETRY (EM);  Surgeon: Mauri Pole, MD;  [facility];  Service: Endoscopy;  Laterality: N/A;  ? ESOPHAGOGASTRODUODENOSCOPY N/A 10/09/2014  ? Procedure: ESOPHAGOGASTRODUODENOSCOPY (EGD);  Surgeon: Gatha Mayer, MD;  [facility];  Service: Endoscopy;  Laterality: N/A;  ? ESOPHAGOGASTRODUODENOSCOPY (EGD) WITH PROPOFOL N/A 08/23/2015  ? Procedure: ESOPHAGOGASTRODUODENOSCOPY (EGD) WITH PROPOFOL;  Surgeon: Mauri Pole, MD;  [facility];  Service: Endoscopy;  Laterality: N/A;  ? ESOPHAGOGASTRODUODENOSCOPY (EGD) WITH PROPOFOL N/A 10/06/2015  ? Procedure: ESOPHAGOGASTRODUODENOSCOPY (EGD) WITH PROPOFOL;  Surgeon: Mauri Pole, MD;  Location: [facility];  Service: Endoscopy;  Laterality: N/A;  Rigiflex ballon size 35m-35mm size 45 minute proc,need Fluro ?Gastografin esophagram 2 hrs post EGD ?  ? GASTRIC ROUX-EN-Y N/A 04/05/2020  ? Procedure: LAPAROSCOPIC ROUX-EN-Y GASTRIC BYPASS WITH UPPER ENDOSCOPY;  Surgeon: WGreer Pickerel MD;  [facility];  Service: General;  Laterality: N/A;  ? PACEMAKER INSERTION    ? POLYPECTOMY  05/06/2018  ? Procedure: POLYPECTOMY;  Surgeon: MRush LandmarkGTelford Nab, MD;  [place] [facility]  Service: Gastroenterology;;  ? [place] 05/01/2018  ? Procedure: PPM GENERATOR CHANGEOUT;  Surgeon: KDeboraha Sprang MD;  [facility];  Service: Cardiovascular;  Laterality: N/A;  ?  TOTAL KNEE ARTHROPLASTY Left 08/15/2021  ? Procedure: TOTAL KNEE ARTHROPLASTY;  Surgeon: Gaynelle Arabian, MD;  [facility];  Service: Orthopedics;  Laterality: Left;  ? ? ?There were no vitals filed for this visit. ? ? Subjective Assessment - 09/26/21 1103   ? ? Subjective Patient stated he's doing good, yesterday he did more than usual with church and his pain went up to an 8/10. He rated his R knee pain a 3/10.    ? Pertinent History pacemaker   ? Limitations Lifting;Standing;Walking;House hold activities   ? Patient Stated Goals walk, have good ROM, get back to some work and travel   ? Currently in Pain? Yes   ? Pain Score 2    ? Pain Location Knee   ? Pain Descriptors / Indicators Aching;Tightness   ? ?  ?  ? ?  ? ? ? ? ? ? ? ? ? ? ? ? ? ? ? ? ? ? ? ? [facility] Adult PT Treatment/Exercise - 09/26/21 0001   ? ?  ? Knee/Hip Exercises: Stretches  ? Other Knee/Hip Stretches knee flex and ext 30 secs x3   ?  ? Knee/Hip Exercises: Aerobic  ? Nustep lvl 5 x 6 mins   ?  ? Knee/Hip Exercises: Machines for Strengthening  ? Cybex Knee Extension 2x10 B LE 20#   ? Cybex Knee Flexion 2x10 45# B LE   ? Other Machine sports cord 15# x4 fwd and backwards   ?  ? Knee/Hip Exercises: Standing  ? Terminal Knee Extension AROM;Strengthening;Left;2 sets;10 reps   ? Theraband Level (Terminal Knee Extension) Level 4 (Blue)   ? Lateral Step Up Both;1 set;10 reps   ? Lateral Step Up Limitations 6'   ? Forward Step Up Both;1 set;10 reps   ? Forward Step Up Limitations 6'   ? Other Standing Knee Exercises marches on airex w/ 3# on B LE   ?  ? Knee/Hip Exercises: Seated  ? Hamstring Curl AROM;Left;Strengthening;2 sets;10 reps   ? Hamstring Limitations blue TB   ? Sit to Sand 2 sets;10 reps;without UE support   10 reps w/ chest press 4#  ? ?  ?  ? ?  ? ? ? ? ? ? ? ? ? ? ? ? PT Short Term Goals - 09/21/21 1124   ? ?  ? PT SHORT TERM GOAL #1  ? Title indepednent with initial HEP   ? Time 2   ? Period Weeks   ? Status Achieved   ? ?  ?  ? ?  ? ? ? ? PT Long Term Goals - 09/21/21 1124   ? ?  ? PT LONG TERM GOAL #2  ? Title increase AROM of the left knee to 10-115 degrees   ? Baseline 15-113   ? Time 8   ? Period Weeks   ? Status On-going   ?  ? PT LONG TERM GOAL #3  ? Title decrease TUG time to 25 seconds   ? Baseline 9.16   ? Time 8   ? Period Weeks   ? Status Achieved   ?  ? PT LONG TERM GOAL #4  ? Title walk with SPC or LRAD x 300 feet   ? Status Partially Met    ?  ? PT LONG TERM GOAL #5  ? Title sit to stand without use of the UE's   ? Status Achieved   ? ?  ?  ? ?  ? ? ? ? ? ? ? ?  Plan - 09/26/21 1145   ? ? Clinical Impression Statement Patient came in doing well. He stated he was driving to [place] after session so educated patient about taking rest breaks. Focused today's session on strengthening and stretching. He was compliant and did well overall.   ? Stability/Clinical Decision Making Evolving/Moderate complexity   ? Clinical Decision Making Low   ? Rehab Potential Good   ? PT Frequency 3x / week   ? PT Duration 8 weeks   ? PT Treatment/Interventions ADLs/Self Care Home Management;Cryotherapy;Gait training;Stair training;Functional mobility training;Therapeutic activities;Therapeutic exercise;Balance training;Neuromuscular re-education;Manual techniques;Patient/family education;Vasopneumatic Device   ? PT Next Visit Plan progress as tolerated   ? Consulted and Agree with Plan of Care Patient   ? ?  ?  ? ?  ? ? ?Patient will benefit from skilled therapeutic intervention in order to improve the following deficits and impairments:  Difficulty walking, Abnormal gait, Decreased coordination, Decreased range of motion, Cardiopulmonary status limiting activity, Decreased activity tolerance, Pain, Impaired flexibility, Decreased scar mobility, Decreased balance, Decreased mobility, Decreased strength, Increased edema ? ?Visit Diagnosis: ?Acute pain of left knee ? ?Difficulty in walking, not elsewhere classified ? ?Stiffness of left knee, not elsewhere classified ? ?Localized edema ? ? ? ? ?Problem List ?Patient Active Problem List  ? Diagnosis Date Noted  ? Primary osteoarthritis of left knee 08/15/2021  ? Double vision 01/05/2021  ? S/P gastric bypass 04/05/2020  ? Need for shingles vaccine 01/28/2020  ? Foreskin problem 08/10/2019  ? HLD (hyperlipidemia) 08/05/2017  ? Routine general medical examination at a health care facility 07/13/2016  ? Advance care planning  07/13/2016  ? Type 2 diabetes mellitus with diabetic neuropathy, unspecified ([place]) 03/17/2016  ? Lower urinary tract symptoms (LUTS) 03/16/2016  ? Dysphagia   ? Essential hypertension   ? Hx of colonic polyps   ? F

## 2021-09-28 ENCOUNTER — Ambulatory Visit: Payer: 59 | Admitting: Internal Medicine

## 2021-09-28 ENCOUNTER — Ambulatory Visit: Payer: BC Managed Care – PPO | Admitting: Physical Therapy

## 2021-09-30 ENCOUNTER — Ambulatory Visit: Payer: BC Managed Care – PPO | Admitting: Physical Therapy

## 2021-10-03 ENCOUNTER — Ambulatory Visit: Payer: BC Managed Care – PPO | Admitting: Physical Therapy

## 2021-10-17 ENCOUNTER — Ambulatory Visit (INDEPENDENT_AMBULATORY_CARE_PROVIDER_SITE_OTHER): Payer: BC Managed Care – PPO

## 2021-10-17 DIAGNOSIS — R001 Bradycardia, unspecified: Secondary | ICD-10-CM | POA: Diagnosis not present

## 2021-10-17 LAB — CUP PACEART REMOTE DEVICE CHECK
Battery Remaining Longevity: 76 mo
Battery Remaining Percentage: 73 %
Battery Voltage: 2.99 V
Brady Statistic AP VP Percent: 1 %
Brady Statistic AP VS Percent: 55 %
Brady Statistic AS VP Percent: 1 %
Brady Statistic AS VS Percent: 45 %
Brady Statistic RA Percent Paced: 54 %
Brady Statistic RV Percent Paced: 1 %
Date Time Interrogation Session: 20230521020014
Implantable Lead Implant Date: 20061219
Implantable Lead Implant Date: 20061219
Implantable Lead Location: 753859
Implantable Lead Location: 753860
Implantable Pulse Generator Implant Date: 20191204
Lead Channel Impedance Value: 1225 Ohm
Lead Channel Impedance Value: 450 Ohm
Lead Channel Pacing Threshold Amplitude: 1.25 V
Lead Channel Pacing Threshold Amplitude: 2 V
Lead Channel Pacing Threshold Pulse Width: 0.5 ms
Lead Channel Pacing Threshold Pulse Width: 0.8 ms
Lead Channel Sensing Intrinsic Amplitude: 5 mV
Lead Channel Sensing Intrinsic Amplitude: 8.6 mV
Lead Channel Setting Pacing Amplitude: 1.5 V
Lead Channel Setting Pacing Amplitude: 3.5 V
Lead Channel Setting Pacing Pulse Width: 0.5 ms
Lead Channel Setting Sensing Sensitivity: 2.5 mV
Pulse Gen Model: 2272
Pulse Gen Serial Number: 9090970

## 2021-11-03 NOTE — Progress Notes (Signed)
Remote pacemaker transmission.   

## 2021-11-04 ENCOUNTER — Encounter (HOSPITAL_COMMUNITY): Payer: Self-pay | Admitting: *Deleted

## 2021-11-27 NOTE — Progress Notes (Deleted)
Cardiology Office Note   Date:  11/27/2021   ID:  Glenn Lyons, DOB 1956/02/15, MRN [id number]  PCP:  Tonia Ghent, MD  Cardiologist:   Virl Axe, MD Referring:  ***  No chief complaint on file.     History of Present Illness: Glenn Lyons is a 66 y.o. male who presents for ***     Past Medical History:  Diagnosis Date   Achalasia    with prev eval at [facility].  No intervention as of 2012   Arthritis    Aspiration pneumonia (HCC)    Backache, unspecified    Cardiac pacemaker St. Jude    ERI 2008   Depressive disorder, not elsewhere classified    Diabetes ([place])    Morbid obesity ([place])    Obstructive sleep apnea    biapap settign 25-22   Presence of permanent cardiac pacemaker    St. Jude- Dr. Caryl Comes follows -device Check 07-06-14   Sinus bradycardia /pauses    Stricture and stenosis of esophagus     Past Surgical History:  Procedure Laterality Date   BALLOON DILATION N/A 10/06/2015   Procedure: BALLOON DILATION;  Surgeon: Mauri Pole, MD;  Location: [place] [facility];  Service: Endoscopy;  Laterality: N/A;   BIOPSY  05/06/2018   Procedure: BIOPSY;  Surgeon: Rush Landmark Telford Nab., MD;  Location: [place];  Service: Gastroenterology;;   COLONOSCOPY W/ POLYPECTOMY  11/01/2010   COLONOSCOPY WITH PROPOFOL N/A 10/05/2014   Procedure: COLONOSCOPY WITH PROPOFOL;  Surgeon: Gatha Mayer, MD;  [facility];  Service: Endoscopy;  Laterality: N/A;   COLONOSCOPY WITH PROPOFOL N/A 05/06/2018   Procedure: COLONOSCOPY WITH PROPOFOL;  Surgeon: Rush Landmark Telford Nab., MD;  Location: [place];  Service: Gastroenterology;  Laterality: N/A;   ESOPHAGEAL DILATION     ESOPHAGEAL MANOMETRY N/A 08/23/2015   Procedure: ESOPHAGEAL MANOMETRY (EM);  Surgeon: Mauri Pole, MD;  [facility];  Service: Endoscopy;  Laterality: N/A;   ESOPHAGOGASTRODUODENOSCOPY N/A 10/09/2014   Procedure: ESOPHAGOGASTRODUODENOSCOPY (EGD);  Surgeon: Gatha Mayer, MD;  [facility];  Service: Endoscopy;  Laterality: N/A;   ESOPHAGOGASTRODUODENOSCOPY (EGD) WITH PROPOFOL N/A 08/23/2015   Procedure: ESOPHAGOGASTRODUODENOSCOPY (EGD) WITH PROPOFOL;  Surgeon: Mauri Pole, MD;  [facility];  Service: Endoscopy;  Laterality: N/A;   ESOPHAGOGASTRODUODENOSCOPY (EGD) WITH PROPOFOL N/A 10/06/2015   Procedure: ESOPHAGOGASTRODUODENOSCOPY (EGD) WITH PROPOFOL;  Surgeon: Mauri Pole, MD;  Location: [place] [facility];  Service: Endoscopy;  Laterality: N/A;  Rigiflex ballon size 1m-35mm size 45 minute proc,need Fluro Gastografin esophagram 2 hrs post EGD    GASTRIC ROUX-EN-Y N/A 04/05/2020   Procedure: LAPAROSCOPIC ROUX-EN-Y GASTRIC BYPASS WITH UPPER ENDOSCOPY;  Surgeon: WGreer Pickerel MD;  [facility];  Service: General;  Laterality: N/A;   PACEMAKER INSERTION     POLYPECTOMY  05/06/2018   Procedure: POLYPECTOMY;  Surgeon: MIrving Copas, MD;  [place]  Service: Gastroenterology;;   PBetsy PriesGENERATOR CHANGEOUT N/A 05/01/2018   Procedure: PPM GENERATOR CHANGEOUT;  Surgeon: KDeboraha Sprang MD;  [place]  Service: Cardiovascular;  Laterality: N/A;   TOTAL KNEE ARTHROPLASTY Left 08/15/2021   Procedure: TOTAL KNEE ARTHROPLASTY;  Surgeon: AGaynelle Arabian MD;  [facility];  Service: Orthopedics;  Laterality: Left;     Current Outpatient Medications  Medication Sig Dispense Refill   acetaminophen (TYLENOL) 500 MG tablet Take 1,000 mg by mouth every 6 (six) hours as needed for moderate pain.     Aromatic Inhalants (  VICKS VAPOR INHALER IN) Place 1 spray into both nostrils daily as needed (congestion).     Blood Glucose Monitoring Suppl (ONETOUCH VERIO) w/Device KIT Inject 1 kit into the skin every morning. Use to test blood sugar each day before breakfast and 2 hours after a meal for 2 weeks.  Diagnosis:  E11.9  Non-insulin dependent. 1 kit 0   gabapentin (NEURONTIN) 300 MG capsule Take a 300 mg capsule three times a  day for two weeks following surgery.Then take a 300 mg capsule two times a day for two weeks. Then take a 300 mg capsule once a day for two weeks. Then discontinue. (Patient taking differently: 2 (two) times daily. Take a 300 mg capsule three times a day for two weeks following surgery.Then take a 300 mg capsule two times a day for two weeks. Then take a 300 mg capsule once a day for two weeks. Then discontinue.) 84 capsule 0   Lancets (ONETOUCH ULTRASOFT) lancets USE TO TEST BLOOD SUGAR ONCE DAILY OR AS NEEDED 100 each 3   methocarbamol (ROBAXIN) 500 MG tablet Take 1 tablet (500 mg total) by mouth every 6 (six) hours as needed for muscle spasms. 40 tablet 0   ONETOUCH VERIO test strip USE TO TEST BLOOD SUGAR ONCE DAILY OR AS INSTRUCTED 100 strip 3   oxyCODONE (OXY IR/ROXICODONE) 5 MG immediate release tablet Take 1-2 tablets (5-10 mg total) by mouth every 6 (six) hours as needed for severe pain. (Patient taking differently: Take 5-10 mg by mouth as needed for severe pain.) 42 tablet 0   traMADol (ULTRAM) 50 MG tablet Take 1-2 tablets (50-100 mg total) by mouth every 6 (six) hours as needed for moderate pain. (Patient taking differently: Take 50-100 mg by mouth every 6 (six) hours as needed for moderate pain. As needed) 40 tablet 0   No current facility-administered medications for this visit.    Allergies:   Morphine and Metformin and related    Social History:  The patient  reports that he quit smoking about 17 years ago. His smoking use included cigarettes. He has a 20.00 pack-year smoking history. He has never used smokeless tobacco. He reports that he does not drink alcohol and does not use drugs.   Family History:  The patient's ***family history includes Cancer in his father; Dementia in his mother; Heart attack in his father; Heart disease in his father; Hypertension in his father and mother; Prostate cancer in his father.    ROS:  Please see the history of present illness.   Otherwise,  review of systems are positive for {NONE DEFAULTED:18576}.   All other systems are reviewed and negative.    PHYSICAL EXAM: VS:  There were no vitals taken for this visit. , BMI There is no height or weight on file to calculate BMI. GENERAL:  Well appearing HEENT:  Pupils equal round and reactive, fundi not visualized, oral mucosa unremarkable NECK:  No jugular venous distention, waveform within normal limits, carotid upstroke brisk and symmetric, no bruits, no thyromegaly LYMPHATICS:  No cervical, inguinal adenopathy LUNGS:  Clear to auscultation bilaterally BACK:  No CVA tenderness CHEST:  Unremarkable HEART:  PMI not displaced or sustained,S1 and S2 within normal limits, no S3, no S4, no clicks, no rubs, *** murmurs ABD:  Flat, positive bowel sounds normal in frequency in pitch, no bruits, no rebound, no guarding, no midline pulsatile mass, no hepatomegaly, no splenomegaly EXT:  2 plus pulses throughout, no edema, no cyanosis no clubbing SKIN:  No rashes no nodules NEURO:  Cranial nerves II through XII grossly intact, motor grossly intact throughout PSYCH:  Cognitively intact, oriented to person place and time    EKG:  EKG {ACTION; IS/IS FOA:55258948} ordered today. The ekg ordered today demonstrates ***   Recent Labs: 01/03/2021: TSH 1.61 08/12/2021: ALT 28 08/16/2021: BUN 14; Creatinine, Ser 0.79; Hemoglobin 14.9; Platelets 209; Potassium 4.9; Sodium 134    Lipid Panel    Component Value Date/Time   CHOL 163 08/07/2019 0834   TRIG 384.0 (H) 08/07/2019 0834   HDL 32.60 (L) 08/07/2019 0834   CHOLHDL 5 08/07/2019 0834   VLDL 76.8 (H) 08/07/2019 0834   LDLDIRECT 80.0 08/07/2019 0834      Wt Readings from Last 3 Encounters:  09/16/21 (!) 315 lb 9.6 oz (143.2 kg)  08/15/21 (!) 301 lb (136.5 kg)  08/12/21 (!) 301 lb (136.5 kg)      Other studies Reviewed: Additional studies/ records that were reviewed today include: ***. Review of the above records demonstrates:  Please  see elsewhere in the note.  ***   ASSESSMENT AND PLAN:  Sinus node dysfunction:  He is up to date with follow up.   I reviewed Dr. Olin Pia last note.  There has been some atrial lead impedance variability which they are following.  ***  Sleep apnea:  ***     Current medicines are reviewed at length with the patient today.  The patient {ACTIONS; HAS/DOES NOT HAVE:19233} concerns regarding medicines.  The following changes have been made:  {PLAN; NO CHANGE:13088:s}  Labs/ tests ordered today include: *** No orders of the defined types were placed in this encounter.    Disposition:   FU with ***    Signed, Minus Breeding, MD  11/27/2021 4:56 PM    [facility]

## 2021-11-28 ENCOUNTER — Ambulatory Visit: Payer: BC Managed Care – PPO | Admitting: Family Medicine

## 2021-11-28 ENCOUNTER — Ambulatory Visit: Payer: BC Managed Care – PPO | Admitting: Cardiology

## 2021-12-02 NOTE — Progress Notes (Deleted)
Cardiology Office Note   Date:  12/02/2021   ID:  Glenn Lyons, DOB 1955/12/02, MRN [id number]  PCP:  Tonia Ghent, MD  Cardiologist:   Virl Axe, MD Referring:  ***  No chief complaint on file.     History of Present Illness: Glenn Lyons is a 66 y.o. male who presents for follow up of hypertension, DM 2, OSA on CPAP, sinus node dysfunction status post permanent pacemaker. ***    Past Medical History:  Diagnosis Date   Achalasia    with prev eval at [facility].  No intervention as of 2012   Arthritis    Aspiration pneumonia (HCC)    Backache, unspecified    Cardiac pacemaker St. Jude    ERI 2008   Depressive disorder, not elsewhere classified    Diabetes ([place])    Morbid obesity ([place])    Obstructive sleep apnea    biapap settign 25-22   Presence of permanent cardiac pacemaker    St. Jude- Dr. Caryl Comes follows -device Check 07-06-14   Sinus bradycardia /pauses    Stricture and stenosis of esophagus     Past Surgical History:  Procedure Laterality Date   BALLOON DILATION N/A 10/06/2015   Procedure: BALLOON DILATION;  Surgeon: Mauri Pole, MD;  Location: [place] [facility];  Service: Endoscopy;  Laterality: N/A;   BIOPSY  05/06/2018   Procedure: BIOPSY;  Surgeon: Rush Landmark Telford Nab., MD;  Location: [place];  Service: Gastroenterology;;   COLONOSCOPY W/ POLYPECTOMY  11/01/2010   COLONOSCOPY WITH PROPOFOL N/A 10/05/2014   Procedure: COLONOSCOPY WITH PROPOFOL;  Surgeon: Gatha Mayer, MD;  [facility];  Service: Endoscopy;  Laterality: N/A;   COLONOSCOPY WITH PROPOFOL N/A 05/06/2018   Procedure: COLONOSCOPY WITH PROPOFOL;  Surgeon: Rush Landmark Telford Nab., MD;  Location: [place];  Service: Gastroenterology;  Laterality: N/A;   ESOPHAGEAL DILATION     ESOPHAGEAL MANOMETRY N/A 08/23/2015   Procedure: ESOPHAGEAL MANOMETRY (EM);  Surgeon: Mauri Pole, MD;  [facility];  Service: Endoscopy;  Laterality: N/A;   ESOPHAGOGASTRODUODENOSCOPY  N/A 10/09/2014   Procedure: ESOPHAGOGASTRODUODENOSCOPY (EGD);  Surgeon: Gatha Mayer, MD;  [facility];  Service: Endoscopy;  Laterality: N/A;   ESOPHAGOGASTRODUODENOSCOPY (EGD) WITH PROPOFOL N/A 08/23/2015   Procedure: ESOPHAGOGASTRODUODENOSCOPY (EGD) WITH PROPOFOL;  Surgeon: Mauri Pole, MD;  [facility];  Service: Endoscopy;  Laterality: N/A;   ESOPHAGOGASTRODUODENOSCOPY (EGD) WITH PROPOFOL N/A 10/06/2015   Procedure: ESOPHAGOGASTRODUODENOSCOPY (EGD) WITH PROPOFOL;  Surgeon: Mauri Pole, MD;  Location: [place] [facility];  Service: Endoscopy;  Laterality: N/A;  Rigiflex ballon size 24m-35mm size 45 minute proc,need Fluro Gastografin esophagram 2 hrs post EGD    GASTRIC ROUX-EN-Y N/A 04/05/2020   Procedure: LAPAROSCOPIC ROUX-EN-Y GASTRIC BYPASS WITH UPPER ENDOSCOPY;  Surgeon: WGreer Pickerel MD;  [facility];  Service: General;  Laterality: N/A;   PACEMAKER INSERTION     POLYPECTOMY  05/06/2018   Procedure: POLYPECTOMY;  Surgeon: MIrving Copas, MD;  [place]  Service: Gastroenterology;;   PBetsy PriesGENERATOR CHANGEOUT N/A 05/01/2018   Procedure: PPM GENERATOR CHANGEOUT;  Surgeon: KDeboraha Sprang MD;  [facility] [place];  Service: Cardiovascular;  Laterality: N/A;   TOTAL KNEE ARTHROPLASTY Left 08/15/2021   Procedure: TOTAL KNEE ARTHROPLASTY;  Surgeon: AGaynelle Arabian MD;  [facility];  Service: Orthopedics;  Laterality: Left;     Current Outpatient Medications  Medication Sig Dispense Refill   acetaminophen (TYLENOL) 500 MG tablet Take 1,000 mg by mouth  every 6 (six) hours as needed for moderate pain.     Aromatic Inhalants (VICKS VAPOR INHALER IN) Place 1 spray into both nostrils daily as needed (congestion).     Blood Glucose Monitoring Suppl (ONETOUCH VERIO) w/Device KIT Inject 1 kit into the skin every morning. Use to test blood sugar each day before breakfast and 2 hours after a meal for 2 weeks.  Diagnosis:  E11.9   Non-insulin dependent. 1 kit 0   gabapentin (NEURONTIN) 300 MG capsule Take a 300 mg capsule three times a day for two weeks following surgery.Then take a 300 mg capsule two times a day for two weeks. Then take a 300 mg capsule once a day for two weeks. Then discontinue. (Patient taking differently: 2 (two) times daily. Take a 300 mg capsule three times a day for two weeks following surgery.Then take a 300 mg capsule two times a day for two weeks. Then take a 300 mg capsule once a day for two weeks. Then discontinue.) 84 capsule 0   Lancets (ONETOUCH ULTRASOFT) lancets USE TO TEST BLOOD SUGAR ONCE DAILY OR AS NEEDED 100 each 3   methocarbamol (ROBAXIN) 500 MG tablet Take 1 tablet (500 mg total) by mouth every 6 (six) hours as needed for muscle spasms. 40 tablet 0   ONETOUCH VERIO test strip USE TO TEST BLOOD SUGAR ONCE DAILY OR AS INSTRUCTED 100 strip 3   oxyCODONE (OXY IR/ROXICODONE) 5 MG immediate release tablet Take 1-2 tablets (5-10 mg total) by mouth every 6 (six) hours as needed for severe pain. (Patient taking differently: Take 5-10 mg by mouth as needed for severe pain.) 42 tablet 0   traMADol (ULTRAM) 50 MG tablet Take 1-2 tablets (50-100 mg total) by mouth every 6 (six) hours as needed for moderate pain. (Patient taking differently: Take 50-100 mg by mouth every 6 (six) hours as needed for moderate pain. As needed) 40 tablet 0   No current facility-administered medications for this visit.    Allergies:   Morphine and Metformin and related    Social History:  The patient  reports that he quit smoking about 17 years ago. His smoking use included cigarettes. He has a 20.00 pack-year smoking history. He has never used smokeless tobacco. He reports that he does not drink alcohol and does not use drugs.   Family History:  The patient's ***family history includes Cancer in his father; Dementia in his mother; Heart attack in his father; Heart disease in his father; Hypertension in his father and  mother; Prostate cancer in his father.    ROS:  Please see the history of present illness.   Otherwise, review of systems are positive for {NONE DEFAULTED:18576}.   All other systems are reviewed and negative.    PHYSICAL EXAM: VS:  There were no vitals taken for this visit. , BMI There is no height or weight on file to calculate BMI. GENERAL:  Well appearing HEENT:  Pupils equal round and reactive, fundi not visualized, oral mucosa unremarkable NECK:  No jugular venous distention, waveform within normal limits, carotid upstroke brisk and symmetric, no bruits, no thyromegaly LYMPHATICS:  No cervical, inguinal adenopathy LUNGS:  Clear to auscultation bilaterally BACK:  No CVA tenderness CHEST:  Unremarkable HEART:  PMI not displaced or sustained,S1 and S2 within normal limits, no S3, no S4, no clicks, no rubs, *** murmurs ABD:  Flat, positive bowel sounds normal in frequency in pitch, no bruits, no rebound, no guarding, no midline pulsatile mass, no hepatomegaly, no  splenomegaly EXT:  2 plus pulses throughout, no edema, no cyanosis no clubbing SKIN:  No rashes no nodules NEURO:  Cranial nerves II through XII grossly intact, motor grossly intact throughout PSYCH:  Cognitively intact, oriented to person place and time    EKG:  EKG {ACTION; IS/IS WTP:[id number]} ordered today. The ekg ordered today demonstrates ***   Recent Labs: 01/03/2021: TSH 1.61 08/12/2021: ALT 28 08/16/2021: BUN 14; Creatinine, Ser 0.79; Hemoglobin 14.9; Platelets 209; Potassium 4.9; Sodium 134    Lipid Panel    Component Value Date/Time   CHOL 163 08/07/2019 0834   TRIG 384.0 (H) 08/07/2019 0834   HDL 32.60 (L) 08/07/2019 0834   CHOLHDL 5 08/07/2019 0834   VLDL 76.8 (H) 08/07/2019 0834   LDLDIRECT 80.0 08/07/2019 0834      Wt Readings from Last 3 Encounters:  09/16/21 (!) 315 lb 9.6 oz (143.2 kg)  08/15/21 (!) 301 lb (136.5 kg)  08/12/21 (!) 301 lb (136.5 kg)      Other studies Reviewed: Additional  studies/ records that were reviewed today include: ***. Review of the above records demonstrates:  Please see elsewhere in the note.  ***   ASSESSMENT AND PLAN:  Sinus node dysfunction:  He is up to date with follow up.   I reviewed Dr. Olin Pia last note.  There has been some atrial lead impedance variability which they are following.  ***  Sleep apnea:  ***     Current medicines are reviewed at length with the patient today.  The patient {ACTIONS; HAS/DOES NOT HAVE:19233} concerns regarding medicines.  The following changes have been made:  {PLAN; NO CHANGE:13088:s}  Labs/ tests ordered today include: *** No orders of the defined types were placed in this encounter.    Disposition:   FU with ***    Signed, Minus Breeding, MD  12/02/2021 1:37 PM    [facility]

## 2021-12-05 ENCOUNTER — Ambulatory Visit: Payer: BC Managed Care – PPO | Admitting: Cardiology

## 2021-12-08 ENCOUNTER — Ambulatory Visit: Payer: BC Managed Care – PPO | Admitting: Internal Medicine

## 2021-12-09 ENCOUNTER — Encounter: Payer: Self-pay | Admitting: Cardiology

## 2021-12-09 ENCOUNTER — Ambulatory Visit: Payer: BC Managed Care – PPO | Admitting: Family Medicine

## 2021-12-21 ENCOUNTER — Telehealth: Payer: Self-pay | Admitting: *Deleted

## 2021-12-21 NOTE — Telephone Encounter (Signed)
Benefit verification submitted via [employer].

## 2021-12-22 NOTE — Telephone Encounter (Signed)
Pt informed of below via MyChart.  Per [employer]:  No prior authorization, medical notes or referrals needed. Payer states that Dr. Clearance Coots is In-Network under NPI [id number] and Tax ID [id number] however a different address is listed in the Payer's records. Provider needs to update address with payer prior to claims submission. [id number] Glenn Lyons), CPT code [phone] and specialist office visits are covered at 100% by the payer at the contracted rate with no patient responsibility. Deductibles do not apply to these services. Out of pocket has been met. Coverage is now at 100% of the allowable amount.

## 2021-12-26 ENCOUNTER — Ambulatory Visit: Payer: Self-pay

## 2021-12-26 ENCOUNTER — Encounter: Payer: Self-pay | Admitting: Family Medicine

## 2021-12-26 ENCOUNTER — Ambulatory Visit (INDEPENDENT_AMBULATORY_CARE_PROVIDER_SITE_OTHER): Payer: BC Managed Care – PPO | Admitting: Family Medicine

## 2021-12-26 VITALS — BP 150/90 | Ht 72.0 in | Wt 299.0 lb

## 2021-12-26 DIAGNOSIS — M1711 Unilateral primary osteoarthritis, right knee: Secondary | ICD-10-CM

## 2021-12-26 MED ORDER — TRIAMCINOLONE ACETONIDE 32 MG IX SRER
32.0000 mg | Freq: Once | INTRA_ARTICULAR | Status: AC
  Administered 2021-12-26: 32 mg via INTRA_ARTICULAR

## 2021-12-26 NOTE — Assessment & Plan Note (Signed)
Completed aspiration and injection of Zilretta today.

## 2021-12-26 NOTE — Progress Notes (Signed)
Glenn Lyons - 66 y.o. male MRN [id number]  Date of birth: Sep 10, 1955  SUBJECTIVE:  Including CC & ROS.  No chief complaint on file.   Glenn Lyons is a 66 y.o. male that is here for a Zilretta injection.    Review of Systems See HPI   HISTORY: Past Medical, Surgical, Social, and Family History Reviewed & Updated per EMR.   Pertinent Historical Findings include:  Past Medical History:  Diagnosis Date   Achalasia    with prev eval at [facility].  No intervention as of 2012   Arthritis    Aspiration pneumonia (HCC)    Backache, unspecified    Cardiac pacemaker St. Jude    ERI 2008   Depressive disorder, not elsewhere classified    Diabetes ([place])    Morbid obesity ([place])    Obstructive sleep apnea    biapap settign 25-22   Presence of permanent cardiac pacemaker    St. Jude- Dr. Caryl Comes follows -device Check 07-06-14   Sinus bradycardia /pauses    Stricture and stenosis of esophagus     Past Surgical History:  Procedure Laterality Date   BALLOON DILATION N/A 10/06/2015   Procedure: BALLOON DILATION;  Surgeon: Mauri Pole, MD;  Location: [place] [facility];  Service: Endoscopy;  Laterality: N/A;   BIOPSY  05/06/2018   Procedure: BIOPSY;  Surgeon: Rush Landmark Telford Nab., MD;  Location: [place];  Service: Gastroenterology;;   COLONOSCOPY W/ POLYPECTOMY  11/01/2010   COLONOSCOPY WITH PROPOFOL N/A 10/05/2014   Procedure: COLONOSCOPY WITH PROPOFOL;  Surgeon: Gatha Mayer, MD;  [facility];  Service: Endoscopy;  Laterality: N/A;   COLONOSCOPY WITH PROPOFOL N/A 05/06/2018   Procedure: COLONOSCOPY WITH PROPOFOL;  Surgeon: Rush Landmark Telford Nab., MD;  Location: [place];  Service: Gastroenterology;  Laterality: N/A;   ESOPHAGEAL DILATION     ESOPHAGEAL MANOMETRY N/A 08/23/2015   Procedure: ESOPHAGEAL MANOMETRY (EM);  Surgeon: Mauri Pole, MD;  [facility];  Service: Endoscopy;  Laterality: N/A;   ESOPHAGOGASTRODUODENOSCOPY N/A 10/09/2014   Procedure:  ESOPHAGOGASTRODUODENOSCOPY (EGD);  Surgeon: Gatha Mayer, MD;  [facility];  Service: Endoscopy;  Laterality: N/A;   ESOPHAGOGASTRODUODENOSCOPY (EGD) WITH PROPOFOL N/A 08/23/2015   Procedure: ESOPHAGOGASTRODUODENOSCOPY (EGD) WITH PROPOFOL;  Surgeon: Mauri Pole, MD;  [facility];  Service: Endoscopy;  Laterality: N/A;   ESOPHAGOGASTRODUODENOSCOPY (EGD) WITH PROPOFOL N/A 10/06/2015   Procedure: ESOPHAGOGASTRODUODENOSCOPY (EGD) WITH PROPOFOL;  Surgeon: Mauri Pole, MD;  Location: [place] [facility];  Service: Endoscopy;  Laterality: N/A;  Rigiflex ballon size 22m-35mm size 45 minute proc,need Fluro Gastografin esophagram 2 hrs post EGD    GASTRIC ROUX-EN-Y N/A 04/05/2020   Procedure: LAPAROSCOPIC ROUX-EN-Y GASTRIC BYPASS WITH UPPER ENDOSCOPY;  Surgeon: WGreer Pickerel MD;  [facility];  Service: General;  Laterality: N/A;   PACEMAKER INSERTION     POLYPECTOMY  05/06/2018   Procedure: POLYPECTOMY;  Surgeon: MIrving Copas, MD;  [place]  Service: Gastroenterology;;   PBetsy PriesGENERATOR CHANGEOUT N/A 05/01/2018   Procedure: PPM GENERATOR CHANGEOUT;  Surgeon: KDeboraha Sprang MD;  [facility] [place] [facility];  Service: Cardiovascular;  Laterality: N/A;   TOTAL KNEE ARTHROPLASTY Left 08/15/2021   Procedure: TOTAL KNEE ARTHROPLASTY;  Surgeon: AGaynelle Arabian MD;  [facility];  Service: Orthopedics;  Laterality: Left;     PHYSICAL EXAM:  VS: BP (!) 150/90 (BP Location: Right Arm, Patient Position: Sitting)   Ht 6' (1.829 m)   Wt 299 lb (135.6 kg)  BMI 40.55 kg/m  Physical Exam Gen: NAD, alert, cooperative with exam, well-appearing MSK:  Neurovascularly intact     Aspiration/Injection Procedure Note Glenn Lyons [birth year]  Procedure: Aspiration and Injection Indications: right knee pain  Procedure Details Consent: Risks of procedure as well as the alternatives and risks of each were explained to the (patient/caregiver).  Consent  for procedure obtained. Time Out: Verified patient identification, verified procedure, site/side was marked, verified correct patient position, special equipment/implants available, medications/allergies/relevent history reviewed, required imaging and test results available.  Performed.  The area was cleaned with iodine and alcohol swabs.    The right knee superior lateral suprapatellar pouch was injected using 3 cc of 1% lidocaine on a 25-gauge 1-1/2 inch needle.  An 18-gauge 1-1/2 needle was used to achieve aspiration.  The syringe was switched and a mixture containing 5 cc's of 32 mg Zilretta and 4 cc's of 0.25% bupivacaine was injected.  Ultrasound was used. Images were obtained in long views showing the injection.   Amount of Fluid Aspirated:  16m Character of Fluid: clear and straw colored Fluid was sent for: n/a  A sterile dressing was applied.  Patient did tolerate procedure well.       ASSESSMENT & PLAN:   OA (osteoarthritis) of knee Completed aspiration and injection of Zilretta today.

## 2022-01-11 ENCOUNTER — Telehealth: Payer: Self-pay | Admitting: *Deleted

## 2022-01-11 NOTE — Telephone Encounter (Signed)
   Pre-operative Risk Assessment    Patient Name: Glenn Lyons  DOB: 11-20-1955 MRN: [id number]      Request for Surgical Clearance    Procedure:   RIGHT TOTAL KNEE ARTHROPLASTY   Date of Surgery:  Clearance 04/24/22                                 Surgeon:  DR. Gaynelle Arabian Surgeon's Group or Practice Name:  [facility] Phone number:  [phone] Fax number:  [phone]   Type of Clearance Requested:   - Medical    Type of Anesthesia:   CHOICE   Additional requests/questions:    Astrid Divine   01/11/2022, 12:52 PM

## 2022-01-11 NOTE — Telephone Encounter (Signed)
   Name: Glenn Lyons  DOB: 21-Nov-1955  MRN: [id number]  Primary Cardiologist: Virl Axe, MD   Preoperative team, please contact this patient and set up a phone call appointment on or after 02/22/2022 for further preoperative risk assessment. Please obtain consent and complete medication review. Thank you for your help.  I confirm that guidance regarding antiplatelet and oral anticoagulation therapy has been completed and, if necessary, noted below (none requested).   Lenna Sciara, NP 01/11/2022, 4:04 PM [place]

## 2022-01-12 ENCOUNTER — Telehealth: Payer: Self-pay | Admitting: *Deleted

## 2022-01-12 NOTE — Telephone Encounter (Signed)
Patient returned call

## 2022-01-12 NOTE — Telephone Encounter (Signed)
Pt has been scheduled for a tele visit, 02/22/22 9:00.  Consent on file / medications reconciled.    Patient Consent for Virtual Visit        [name] has provided verbal consent on 01/12/2022 for a virtual visit (video or telephone).   CONSENT FOR VIRTUAL VISIT FOR:  Glenn Lyons  By participating in this virtual visit I agree to the following:  I hereby voluntarily request, consent and authorize [facility] and its employed or contracted physicians, physician assistants, nurse practitioners or other licensed health care professionals (the Practitioner), to provide me with telemedicine health care services (the "Services") as deemed necessary by the treating Practitioner. I acknowledge and consent to receive the Services by the Practitioner via telemedicine. I understand that the telemedicine visit will involve communicating with the Practitioner through live audiovisual communication technology and the disclosure of certain medical information by electronic transmission. I acknowledge that I have been given the opportunity to request an in-person assessment or other available alternative prior to the telemedicine visit and am voluntarily participating in the telemedicine visit.  I understand that I have the right to withhold or withdraw my consent to the use of telemedicine in the course of my care at any time, without affecting my right to future care or treatment, and that the Practitioner or I may terminate the telemedicine visit at any time. I understand that I have the right to inspect all information obtained and/or recorded in the course of the telemedicine visit and may receive copies of available information for a reasonable fee.  I understand that some of the potential risks of receiving the Services via telemedicine include:  Delay or interruption in medical evaluation due to technological equipment failure or disruption; Information transmitted may not be sufficient (e.g.  poor resolution of images) to allow for appropriate medical decision making by the Practitioner; and/or  In rare instances, security protocols could fail, causing a breach of personal health information.  Furthermore, I acknowledge that it is my responsibility to provide information about my medical history, conditions and care that is complete and accurate to the best of my ability. I acknowledge that Practitioner's advice, recommendations, and/or decision may be based on factors not within their control, such as incomplete or inaccurate data provided by me or distortions of diagnostic images or specimens that may result from electronic transmissions. I understand that the practice of medicine is not an exact science and that Practitioner makes no warranties or guarantees regarding treatment outcomes. I acknowledge that a copy of this consent can be made available to me via my patient portal ([facility]), or I can request a printed copy by calling the office of [facility].    I understand that my insurance will be billed for this visit.   I have read or had this consent read to me. I understand the contents of this consent, which adequately explains the benefits and risks of the Services being provided via telemedicine.  I have been provided ample opportunity to ask questions regarding this consent and the Services and have had my questions answered to my satisfaction. I give my informed consent for the services to be provided through the use of telemedicine in my medical care

## 2022-01-12 NOTE — Telephone Encounter (Signed)
1st attempt to reach pt regarding surgical clearance and the need for a tele visit.  Left a message for pt to call back and ask for the preop team. 

## 2022-01-12 NOTE — Telephone Encounter (Signed)
Pt has been scheduled for a tele visit, 02/22/22 9:00.

## 2022-01-16 ENCOUNTER — Ambulatory Visit (INDEPENDENT_AMBULATORY_CARE_PROVIDER_SITE_OTHER): Payer: BC Managed Care – PPO

## 2022-01-16 DIAGNOSIS — R001 Bradycardia, unspecified: Secondary | ICD-10-CM | POA: Diagnosis not present

## 2022-01-17 ENCOUNTER — Ambulatory Visit: Payer: BC Managed Care – PPO | Admitting: Cardiology

## 2022-01-17 LAB — CUP PACEART REMOTE DEVICE CHECK
Battery Remaining Longevity: 74 mo
Battery Remaining Percentage: 71 %
Battery Voltage: 2.99 V
Brady Statistic AP VP Percent: 1 %
Brady Statistic AP VS Percent: 58 %
Brady Statistic AS VP Percent: 1 %
Brady Statistic AS VS Percent: 41 %
Brady Statistic RA Percent Paced: 58 %
Brady Statistic RV Percent Paced: 1 %
Date Time Interrogation Session: 20230821032821
Implantable Lead Implant Date: 20061219
Implantable Lead Implant Date: 20061219
Implantable Lead Location: 753859
Implantable Lead Location: 753860
Implantable Pulse Generator Implant Date: 20191204
Lead Channel Impedance Value: 1275 Ohm
Lead Channel Impedance Value: 480 Ohm
Lead Channel Pacing Threshold Amplitude: 1 V
Lead Channel Pacing Threshold Amplitude: 1.75 V
Lead Channel Pacing Threshold Pulse Width: 0.5 ms
Lead Channel Pacing Threshold Pulse Width: 0.8 ms
Lead Channel Sensing Intrinsic Amplitude: 5 mV
Lead Channel Sensing Intrinsic Amplitude: 8.6 mV
Lead Channel Setting Pacing Amplitude: 1.25 V
Lead Channel Setting Pacing Amplitude: 3.25 V
Lead Channel Setting Pacing Pulse Width: 0.5 ms
Lead Channel Setting Sensing Sensitivity: 2.5 mV
Pulse Gen Model: 2272
Pulse Gen Serial Number: 9090970

## 2022-01-23 ENCOUNTER — Ambulatory Visit: Payer: BC Managed Care – PPO | Admitting: Family Medicine

## 2022-02-09 ENCOUNTER — Ambulatory Visit: Payer: BC Managed Care – PPO | Admitting: Family Medicine

## 2022-02-09 ENCOUNTER — Encounter: Payer: Self-pay | Admitting: Family Medicine

## 2022-02-09 VITALS — BP 138/64 | HR 64 | Temp 97.7°F | Ht 72.0 in | Wt 304.0 lb

## 2022-02-09 DIAGNOSIS — M1710 Unilateral primary osteoarthritis, unspecified knee: Secondary | ICD-10-CM

## 2022-02-09 DIAGNOSIS — Z8639 Personal history of other endocrine, nutritional and metabolic disease: Secondary | ICD-10-CM

## 2022-02-09 DIAGNOSIS — Z950 Presence of cardiac pacemaker: Secondary | ICD-10-CM | POA: Diagnosis not present

## 2022-02-09 DIAGNOSIS — Z9884 Bariatric surgery status: Secondary | ICD-10-CM

## 2022-02-09 NOTE — Progress Notes (Unsigned)
Surgery pending.  R T knee arthroplasty.  H/o L knee surgery.  He has tolerated anesthesia in the past.  H/o pacer noted.  Will have cardiology follow up.    No CP.  No SOB.  Sig R knee pain. Surgery should help with pain and then mobility.  D/w pt about if he was appropriately low risk for surgery, pending cardiology eval.    Meds, vitals, and allergies reviewed.   ROS: Per HPI unless specifically indicated in ROS section   GEN: nad, alert and oriented HEENT: ncat NECK: supple w/o LA CV: rrr.  PULM: ctab, no inc wob ABD: soft, +bs EXT: no edema SKIN: no acute rash  EKG d/w pt at OV.   Labs pending.

## 2022-02-09 NOTE — Patient Instructions (Signed)
I'll update ortho after I get your labs.  Take care.   Update me as needed.

## 2022-02-10 LAB — COMPREHENSIVE METABOLIC PANEL
ALT: 21 U/L (ref 0–53)
AST: 18 U/L (ref 0–37)
Albumin: 3.9 g/dL (ref 3.5–5.2)
Alkaline Phosphatase: 80 U/L (ref 39–117)
BUN: 15 mg/dL (ref 6–23)
CO2: 26 mEq/L (ref 19–32)
Calcium: 9.7 mg/dL (ref 8.4–10.5)
Chloride: 104 mEq/L (ref 96–112)
Creatinine, Ser: 0.86 mg/dL (ref 0.40–1.50)
GFR: 91.45 mL/min (ref >60.00)
Glucose, Bld: 107 mg/dL — ABNORMAL HIGH (ref 70–99)
Potassium: 4.1 mEq/L (ref 3.5–5.1)
Sodium: 138 mEq/L (ref 135–145)
Total Bilirubin: 0.6 mg/dL (ref 0.2–1.2)
Total Protein: 6.6 g/dL (ref 6.0–8.3)

## 2022-02-10 LAB — CBC WITH DIFFERENTIAL/PLATELET
Basophils Absolute: 0 10*3/uL (ref 0.0–0.1)
Basophils Relative: 0.6 % (ref 0.0–3.0)
Eosinophils Absolute: 0.2 10*3/uL (ref 0.0–0.7)
Eosinophils Relative: 3 % (ref 0.0–5.0)
HCT: 43.1 % (ref 39.0–52.0)
Hemoglobin: 14.6 g/dL (ref 13.0–17.0)
Lymphocytes Relative: 19.6 % (ref 12.0–46.0)
Lymphs Abs: 1.5 10*3/uL (ref 0.7–4.0)
MCHC: 33.9 g/dL (ref 30.0–36.0)
MCV: 91.6 fl (ref 78.0–100.0)
Monocytes Absolute: 0.5 10*3/uL (ref 0.1–1.0)
Monocytes Relative: 7 % (ref 3.0–12.0)
Neutro Abs: 5.2 10*3/uL (ref 1.4–7.7)
Neutrophils Relative %: 69.8 % (ref 43.0–77.0)
Platelets: 224 10*3/uL (ref 150.0–400.0)
RBC: 4.71 Mil/uL (ref 4.22–5.81)
RDW: 14.5 % (ref 11.5–15.5)
WBC: 7.5 10*3/uL (ref 4.0–10.5)

## 2022-02-10 LAB — LIPID PANEL
Cholesterol: 166 mg/dL (ref 0–200)
HDL: 40.5 mg/dL (ref >39.00)
LDL Cholesterol: 97 mg/dL (ref 0–99)
NonHDL: 125.17
Total CHOL/HDL Ratio: 4
Triglycerides: 143 mg/dL (ref 0.0–149.0)
VLDL: 28.6 mg/dL (ref 0.0–40.0)

## 2022-02-10 LAB — HEMOGLOBIN A1C: Hgb A1c MFr Bld: 6 % (ref 4.6–6.5)

## 2022-02-10 LAB — VITAMIN B12: Vitamin B-12: 1032 pg/mL — ABNORMAL HIGH (ref 211–911)

## 2022-02-11 ENCOUNTER — Encounter: Payer: Self-pay | Admitting: Family Medicine

## 2022-02-12 NOTE — Progress Notes (Signed)
Remote pacemaker transmission.   

## 2022-02-15 NOTE — Assessment & Plan Note (Signed)
Assuming his cardiology evaluation is unremarkable, and assuming there are no significant lab abnormalities, it would appear that the patient is appropriately low risk for surgery.  Discussed.  See above.  I will await cardiology evaluation.

## 2022-02-20 ENCOUNTER — Ambulatory Visit: Payer: BC Managed Care – PPO | Admitting: Family Medicine

## 2022-02-20 ENCOUNTER — Encounter: Payer: Self-pay | Admitting: Family Medicine

## 2022-02-20 DIAGNOSIS — M1711 Unilateral primary osteoarthritis, right knee: Secondary | ICD-10-CM | POA: Diagnosis not present

## 2022-02-20 NOTE — Assessment & Plan Note (Signed)
Acutely on chronic in nature.  Estimation of his underlying degenerative changes. -Counseled on home exercise therapy and supportive care. -Can pursue Zilretta injection.

## 2022-02-20 NOTE — Progress Notes (Signed)
[employer] - 66 y.o. male MRN [id number]  Date of birth: Jul 07, 1955  SUBJECTIVE:  Including CC & ROS.  No chief complaint on file.   Glenn Lyons is a 66 y.o. male that is following up for his acute on chronic right knee pain.   Review of Systems See HPI   HISTORY: Past Medical, Surgical, Social, and Family History Reviewed & Updated per EMR.   Pertinent Historical Findings include:  Past Medical History:  Diagnosis Date   Achalasia    with prev eval at [facility].  No intervention as of 2012   Arthritis    Aspiration pneumonia (HCC)    Backache, unspecified    Cardiac pacemaker St. Jude    ERI 2008   Depressive disorder, not elsewhere classified    Diabetes ([place])    Morbid obesity ([place])    Obstructive sleep apnea    biapap settign 25-22   Presence of permanent cardiac pacemaker    St. Jude- Dr. Caryl Comes follows -device Check 07-06-14   Sinus bradycardia /pauses    Stricture and stenosis of esophagus     Past Surgical History:  Procedure Laterality Date   BALLOON DILATION N/A 10/06/2015   Procedure: BALLOON DILATION;  Surgeon: Mauri Pole, MD;  Location: [facility] ENDOSCOPY;  Service: Endoscopy;  Laterality: N/A;   BIOPSY  05/06/2018   Procedure: BIOPSY;  Surgeon: Rush Landmark Telford Nab., MD;  Location: [place];  Service: Gastroenterology;;   COLONOSCOPY W/ POLYPECTOMY  11/01/2010   COLONOSCOPY WITH PROPOFOL N/A 10/05/2014   Procedure: COLONOSCOPY WITH PROPOFOL;  Surgeon: Gatha Mayer, MD;  [facility];  Service: Endoscopy;  Laterality: N/A;   COLONOSCOPY WITH PROPOFOL N/A 05/06/2018   Procedure: COLONOSCOPY WITH PROPOFOL;  Surgeon: Rush Landmark Telford Nab., MD;  Location: [place];  Service: Gastroenterology;  Laterality: N/A;   ESOPHAGEAL DILATION     ESOPHAGEAL MANOMETRY N/A 08/23/2015   Procedure: ESOPHAGEAL MANOMETRY (EM);  Surgeon: Mauri Pole, MD;  [facility];  Service: Endoscopy;  Laterality: N/A;   ESOPHAGOGASTRODUODENOSCOPY N/A  10/09/2014   Procedure: ESOPHAGOGASTRODUODENOSCOPY (EGD);  Surgeon: Gatha Mayer, MD;  Location[facility];  Service: Endoscopy;  Laterality: N/A;   ESOPHAGOGASTRODUODENOSCOPY (EGD) WITH PROPOFOL N/A 08/23/2015   Procedure: ESOPHAGOGASTRODUODENOSCOPY (EGD) WITH PROPOFOL;  Surgeon: Mauri Pole, MD;  [facility];  Service: Endoscopy;  Laterality: N/A;   ESOPHAGOGASTRODUODENOSCOPY (EGD) WITH PROPOFOL N/A 10/06/2015   Procedure: ESOPHAGOGASTRODUODENOSCOPY (EGD) WITH PROPOFOL;  Surgeon: Mauri Pole, MD;  Location: [facility] ENDOSCOPY;  Service: Endoscopy;  Laterality: N/A;  Rigiflex ballon size 33m-35mm size 45 minute proc,need Fluro Gastografin esophagram 2 hrs post EGD    GASTRIC ROUX-EN-Y N/A 04/05/2020   Procedure: LAPAROSCOPIC ROUX-EN-Y GASTRIC BYPASS WITH UPPER ENDOSCOPY;  Surgeon: WGreer Pickerel MD;  [facility];  Service: General;  Laterality: N/A;   PACEMAKER INSERTION     POLYPECTOMY  05/06/2018   Procedure: POLYPECTOMY;  Surgeon: MIrving Copas, MD;  Location[place] [facility]  Service: Gastroenterology;;   PBetsy PriesGENERATOR CHANGEOUT N/A 05/01/2018   Procedure: PPM GENERATOR CHANGEOUT;  Surgeon: KDeboraha Sprang MD;  [place];  Service: Cardiovascular;  Laterality: N/A;   TOTAL KNEE ARTHROPLASTY Left 08/15/2021   Procedure: TOTAL KNEE ARTHROPLASTY;  Surgeon: AGaynelle Arabian MD;  [facility];  Service: Orthopedics;  Laterality: Left;     PHYSICAL EXAM:  VS: BP (!) 148/80 (BP Location: Left Arm, Patient Position: Sitting)   Ht 6' (1.829 m)   Wt (!) 304  lb (137.9 kg)   BMI 41.23 kg/m  Physical Exam Gen: NAD, alert, cooperative with exam, well-appearing MSK:  Neurovascularly intact       ASSESSMENT & PLAN:   OA (osteoarthritis) of knee Acutely on chronic in nature.  Estimation of his underlying degenerative changes. -Counseled on home exercise therapy and supportive care. -Can pursue Zilretta injection.

## 2022-02-22 ENCOUNTER — Ambulatory Visit: Payer: BC Managed Care – PPO | Attending: Cardiology | Admitting: Nurse Practitioner

## 2022-02-22 DIAGNOSIS — Z01810 Encounter for preprocedural cardiovascular examination: Secondary | ICD-10-CM | POA: Diagnosis not present

## 2022-02-22 NOTE — Progress Notes (Signed)
Virtual Visit via Telephone Note   Because of Glenn Lyons's co-morbid illnesses, he is at least at moderate risk for complications without adequate follow up.  This format is felt to be most appropriate for this patient at this time.  The patient did not have access to video technology/had technical difficulties with video requiring transitioning to audio format only (telephone).  All issues noted in this document were discussed and addressed.  No physical exam could be performed with this format.  Please refer to the patient's chart for his consent to telehealth for [facility].  Evaluation Performed:  Preoperative cardiovascular risk assessment _____________   Date:  02/22/2022   Patient ID:  Glenn Lyons, DOB 07-Aug-1955, MRN [id number] Patient Location:  Home Provider location:   Office  Primary Care Provider:  Tonia Ghent, MD Primary Cardiologist:  Virl Axe, MD  Chief Complaint / Patient Profile   66 y.o. y/o male with a h/o sinus node dysfunction s/p PPM, chronic diastolic heart failure, hypertension, type 2 diabetes, OSA, and obesity who is pending right total knee arthroplasty on 04/24/2022 with Dr. Gaynelle Arabian and presents today for telephonic preoperative cardiovascular risk assessment.  Past Medical History    Past Medical History:  Diagnosis Date   Achalasia    with prev eval at [facility].  No intervention as of 2012   Arthritis    Aspiration pneumonia (HCC)    Backache, unspecified    Cardiac pacemaker St. Jude    ERI 2008   Depressive disorder, not elsewhere classified    Diabetes ([place])    Morbid obesity ([place])    Obstructive sleep apnea    biapap settign 25-22   Presence of permanent cardiac pacemaker    St. Jude- Dr. Caryl Comes follows -device Check 07-06-14   Sinus bradycardia /pauses    Stricture and stenosis of esophagus    Past Surgical History:  Procedure Laterality Date   BALLOON DILATION N/A 10/06/2015   Procedure: BALLOON DILATION;   Surgeon: Mauri Pole, MD;  Location: [place] ENDOSCOPY;  Service: Endoscopy;  Laterality: N/A;   BIOPSY  05/06/2018   Procedure: BIOPSY;  Surgeon: Rush Landmark Telford Nab., MD;  Location: [place];  Service: Gastroenterology;;   COLONOSCOPY W/ POLYPECTOMY  11/01/2010   COLONOSCOPY WITH PROPOFOL N/A 10/05/2014   Procedure: COLONOSCOPY WITH PROPOFOL;  Surgeon: Gatha Mayer, MD;  [facility];  Service: Endoscopy;  Laterality: N/A;   COLONOSCOPY WITH PROPOFOL N/A 05/06/2018   Procedure: COLONOSCOPY WITH PROPOFOL;  Surgeon: Rush Landmark Telford Nab., MD;  Location: [place];  Service: Gastroenterology;  Laterality: N/A;   ESOPHAGEAL DILATION     ESOPHAGEAL MANOMETRY N/A 08/23/2015   Procedure: ESOPHAGEAL MANOMETRY (EM);  Surgeon: Mauri Pole, MD;  [facility];  Service: Endoscopy;  Laterality: N/A;   ESOPHAGOGASTRODUODENOSCOPY N/A 10/09/2014   Procedure: ESOPHAGOGASTRODUODENOSCOPY (EGD);  Surgeon: Gatha Mayer, MD;  [facility];  Service: Endoscopy;  Laterality: N/A;   ESOPHAGOGASTRODUODENOSCOPY (EGD) WITH PROPOFOL N/A 08/23/2015   Procedure: ESOPHAGOGASTRODUODENOSCOPY (EGD) WITH PROPOFOL;  Surgeon: Mauri Pole, MD;  [facility];  Service: Endoscopy;  Laterality: N/A;   ESOPHAGOGASTRODUODENOSCOPY (EGD) WITH PROPOFOL N/A 10/06/2015   Procedure: ESOPHAGOGASTRODUODENOSCOPY (EGD) WITH PROPOFOL;  Surgeon: Mauri Pole, MD;  Location: [place] ENDOSCOPY;  Service: Endoscopy;  Laterality: N/A;  Rigiflex ballon size 53m-35mm size 45 minute proc,need Fluro Gastografin esophagram 2 hrs post EGD    GASTRIC ROUX-EN-Y N/A 04/05/2020   Procedure: LAPAROSCOPIC ROUX-EN-Y GASTRIC BYPASS WITH UPPER  ENDOSCOPY;  Surgeon: Greer Pickerel, MD;  [facility];  Service: General;  Laterality: N/A;   PACEMAKER INSERTION     POLYPECTOMY  05/06/2018   Procedure: POLYPECTOMY;  Surgeon: Irving Copas., MD;  Location: [place];  Service: Gastroenterology;;    Betsy Pries GENERATOR CHANGEOUT N/A 05/01/2018   Procedure: PPM GENERATOR CHANGEOUT;  Surgeon: Deboraha Sprang, MD;  Location: [place] CV LAB;  Service: Cardiovascular;  Laterality: N/A;   TOTAL KNEE ARTHROPLASTY Left 08/15/2021   Procedure: TOTAL KNEE ARTHROPLASTY;  Surgeon: Gaynelle Arabian, MD;  [facility];  Service: Orthopedics;  Laterality: Left;    Allergies  Allergies  Allergen Reactions   Morphine Other (See Comments)    Cardiac Arrest   Metformin And Related     Intolerant of any dose due to aches    History of Present Illness    Glenn Lyons is a 66 y.o. male who presents via audio/video conferencing for a telehealth visit today.  Pt was last seen in cardiology clinic on 09/16/2021 by Dr. Caryl Comes.  At that time Glenn Lyons was doing well .  The patient is now pending procedure as outlined above. Since his last visit, he has done well from a cardiac standpoint. He denies chest pain, palpitations, dyspnea, pnd, orthopnea, n, v, dizziness, syncope, edema, weight gain, or early satiety. All other systems reviewed and are otherwise negative except as noted above.   Home Medications    Prior to Admission medications   Medication Sig Start Date End Date Taking? Authorizing Provider  acetaminophen (TYLENOL) 500 MG tablet Take 1,000 mg by mouth every 6 (six) hours as needed for moderate pain.    [provider]  Aromatic Inhalants (VICKS VAPOR INHALER IN) Place 1 spray into both nostrils daily as needed (congestion).    [provider]  Blood Glucose Monitoring Suppl (ONETOUCH VERIO) w/Device KIT Inject 1 kit into the skin every morning. Use to test blood sugar each day before breakfast and 2 hours after a meal for 2 weeks.  Diagnosis:  E11.9  Non-insulin dependent. 06/13/16   Tonia Ghent, MD  CALCIUM CITRATE PO Take 1,500 mg by mouth daily.    [provider]  Lancets [facility] ULTRASOFT) lancets USE TO TEST BLOOD SUGAR ONCE DAILY OR AS NEEDED 03/12/17    Tonia Ghent, MD  [facility] VERIO test strip USE TO TEST BLOOD SUGAR ONCE DAILY OR AS INSTRUCTED 10/04/20   Tonia Ghent, MD  sildenafil (VIAGRA) 50 MG tablet Take 50 mg by mouth daily as needed for erectile dysfunction.    [provider]    Physical Exam    Vital Signs:  Glenn Lyons does not have vital signs available for review today.  Given telephonic nature of communication, physical exam is limited. AAOx3. NAD. Normal affect.  Speech and respirations are unlabored.  Accessory Clinical Findings    None  Assessment & Plan    1.  Preoperative Cardiovascular Risk Assessment:  According to the Revised Cardiac Risk Index (RCRI), his Perioperative Risk of Major Cardiac Event is (%): 0.9. His Functional Capacity in METs is: 5.72 according to the Duke Activity Status Index (DASI). Therefore, based on ACC/AHA guidelines, patient would be at acceptable risk for the planned procedure without further cardiovascular testing.   The patient was advised that if he develops new symptoms prior to surgery to contact our office to arrange for a follow-up visit, and he verbalized understanding.  A copy of this note  will be routed to requesting surgeon.  Time:   Today, I have spent 6 minutes with the patient with telehealth technology discussing medical history, symptoms, and management plan.     Lenna Sciara, NP  02/22/2022, 9:11 AM

## 2022-03-01 ENCOUNTER — Ambulatory Visit: Payer: BC Managed Care – PPO | Admitting: Family Medicine

## 2022-03-02 ENCOUNTER — Ambulatory Visit (INDEPENDENT_AMBULATORY_CARE_PROVIDER_SITE_OTHER): Payer: BC Managed Care – PPO | Admitting: Family Medicine

## 2022-03-02 ENCOUNTER — Encounter: Payer: Self-pay | Admitting: Family Medicine

## 2022-03-02 ENCOUNTER — Ambulatory Visit: Payer: Self-pay

## 2022-03-02 DIAGNOSIS — M1711 Unilateral primary osteoarthritis, right knee: Secondary | ICD-10-CM | POA: Diagnosis not present

## 2022-03-02 MED ORDER — TRIAMCINOLONE ACETONIDE 32 MG IX SRER
32.0000 mg | Freq: Once | INTRA_ARTICULAR | Status: AC
  Administered 2022-03-02: 32 mg via INTRA_ARTICULAR

## 2022-03-02 NOTE — Progress Notes (Signed)
Glenn Lyons - 66 y.o. male MRN [id number]  Date of birth: 08/14/1955  SUBJECTIVE:  Including CC & ROS.  No chief complaint on file.   Glenn Lyons is a 66 y.o. male that is  here for zilretta injection.    Review of Systems See HPI   HISTORY: Past Medical, Surgical, Social, and Family History Reviewed & Updated per EMR.   Pertinent Historical Findings include:  Past Medical History:  Diagnosis Date   Achalasia    with prev eval at [facility].  No intervention as of 2012   Arthritis    Aspiration pneumonia (HCC)    Backache, unspecified    Cardiac pacemaker St. Jude    ERI 2008   Depressive disorder, not elsewhere classified    Diabetes ([place])    Morbid obesity ([place])    Obstructive sleep apnea    biapap settign 25-22   Presence of permanent cardiac pacemaker    St. Jude- Dr. Caryl Comes follows -device Check 07-06-14   Sinus bradycardia /pauses    Stricture and stenosis of esophagus     Past Surgical History:  Procedure Laterality Date   BALLOON DILATION N/A 10/06/2015   Procedure: BALLOON DILATION;  Surgeon: Mauri Pole, MD;  Location: [facility] ENDOSCOPY;  Service: Endoscopy;  Laterality: N/A;   BIOPSY  05/06/2018   Procedure: BIOPSY;  Surgeon: Rush Landmark Telford Nab., MD;  Location: [place];  Service: Gastroenterology;;   COLONOSCOPY W/ POLYPECTOMY  11/01/2010   COLONOSCOPY WITH PROPOFOL N/A 10/05/2014   Procedure: COLONOSCOPY WITH PROPOFOL;  Surgeon: Gatha Mayer, MD;  [facility];  Service: Endoscopy;  Laterality: N/A;   COLONOSCOPY WITH PROPOFOL N/A 05/06/2018   Procedure: COLONOSCOPY WITH PROPOFOL;  Surgeon: Rush Landmark Telford Nab., MD;  Location: [place];  Service: Gastroenterology;  Laterality: N/A;   ESOPHAGEAL DILATION     ESOPHAGEAL MANOMETRY N/A 08/23/2015   Procedure: ESOPHAGEAL MANOMETRY (EM);  Surgeon: Mauri Pole, MD;  [facility];  Service: Endoscopy;  Laterality: N/A;   ESOPHAGOGASTRODUODENOSCOPY N/A 10/09/2014   Procedure:  ESOPHAGOGASTRODUODENOSCOPY (EGD);  Surgeon: Gatha Mayer, MD;  [facility];  Service: Endoscopy;  Laterality: N/A;   ESOPHAGOGASTRODUODENOSCOPY (EGD) WITH PROPOFOL N/A 08/23/2015   Procedure: ESOPHAGOGASTRODUODENOSCOPY (EGD) WITH PROPOFOL;  Surgeon: Mauri Pole, MD;  [facility];  Service: Endoscopy;  Laterality: N/A;   ESOPHAGOGASTRODUODENOSCOPY (EGD) WITH PROPOFOL N/A 10/06/2015   Procedure: ESOPHAGOGASTRODUODENOSCOPY (EGD) WITH PROPOFOL;  Surgeon: Mauri Pole, MD;  Location: [place] ENDOSCOPY;  Service: Endoscopy;  Laterality: N/A;  Rigiflex ballon size 24m-35mm size 45 minute proc,need Fluro Gastografin esophagram 2 hrs post EGD    GASTRIC ROUX-EN-Y N/A 04/05/2020   Procedure: LAPAROSCOPIC ROUX-EN-Y GASTRIC BYPASS WITH UPPER ENDOSCOPY;  Surgeon: WGreer Pickerel MD;  [facility];  Service: General;  Laterality: N/A;   PACEMAKER INSERTION     POLYPECTOMY  05/06/2018   Procedure: POLYPECTOMY;  Surgeon: MIrving Copas, MD;  [place]  Service: Gastroenterology;;   PBetsy PriesGENERATOR CHANGEOUT N/A 05/01/2018   Procedure: PPM GENERATOR CHANGEOUT;  Surgeon: KDeboraha Sprang MD;  [facility];  Service: Cardiovascular;  Laterality: N/A;   TOTAL KNEE ARTHROPLASTY Left 08/15/2021   Procedure: TOTAL KNEE ARTHROPLASTY;  Surgeon: AGaynelle Arabian MD;  [facility];  Service: Orthopedics;  Laterality: Left;     PHYSICAL EXAM:  VS: There were no vitals taken for this visit. Physical Exam Gen: NAD, alert, cooperative with exam, well-appearing MSK:  Neurovascularly intact  Aspiration/Injection Procedure Note Glenn Lyons [birth year]  Procedure: Injection Indications: right knee pain  Procedure Details Consent: Risks of procedure as well as the alternatives and risks of each were explained to the (patient/caregiver).  Consent for procedure obtained. Time Out: Verified patient identification, verified procedure, site/side was  marked, verified correct patient position, special equipment/implants available, medications/allergies/relevent history reviewed, required imaging and test results available.  Performed.  The area was cleaned with iodine and alcohol swabs.    The right knee superior lateral suprapatellar pouch was injected using 3 cc of 1% lidocaine on a 25-gauge 1-1/2 inch needle.  An 18-gauge 1-1/2 needle was used to achieve aspiration.  The syringe was switched and a mixture containing 5 cc's of 32 mg Zilretta and 4 cc's of 0.25% bupivacaine was injected.  Ultrasound was used. Images were obtained in long views showing the injection.    A sterile dressing was applied.  Patient did tolerate procedure well.       ASSESSMENT & PLAN:   OA (osteoarthritis) of knee Completed zilretta injection

## 2022-03-02 NOTE — Assessment & Plan Note (Signed)
Completed zilretta injection.  ?

## 2022-03-08 ENCOUNTER — Telehealth (INDEPENDENT_AMBULATORY_CARE_PROVIDER_SITE_OTHER): Payer: BC Managed Care – PPO | Admitting: Family Medicine

## 2022-03-08 ENCOUNTER — Encounter: Payer: Self-pay | Admitting: Family Medicine

## 2022-03-08 VITALS — Ht 72.0 in

## 2022-03-08 DIAGNOSIS — U071 COVID-19: Secondary | ICD-10-CM | POA: Diagnosis not present

## 2022-03-08 MED ORDER — NIRMATRELVIR/RITONAVIR (PAXLOVID)TABLET: 3.0000 | ORAL_TABLET | Freq: Two times a day (BID) | ORAL | 0 refills | Status: AC

## 2022-03-08 NOTE — Progress Notes (Signed)
    Gerre Ranum T. Izumi Mixon, MD, [place] [facility] [place], [address]  Phone: [phone]  FAX: [phone]  Glenn Lyons - 66 y.o. male  MRN [id number]  Date of Birth: 01-11-1956  Date: 03/08/2022  PCP: Tonia Ghent, MD  Referral: Tonia Ghent, MD  Chief Complaint  Patient presents with   Covid Positive    Positive home test this morning Symptoms started last night   Sore Throat   Nasal Congestion   Virtual Visit via Video Note:  I connected with  Glenn Lyons on 03/08/2022  2:40 PM EDT by a video enabled telemedicine application and verified that I am speaking with the correct person using two identifiers.   Location patient: home computer, tablet, or smartphone Location provider: work or home office Consent: Verbal consent directly obtained from [employer]. Persons participating in the virtual visit: patient, provider  I discussed the limitations of evaluation and management by telemedicine and the availability of in person appointments. The patient expressed understanding and agreed to proceed.  Chief Complaint  Patient presents with   Covid Positive    Positive home test this morning Symptoms started last night   Sore Throat   Nasal Congestion    History of Present Illness:  Covid +:  Felt fine until last night, got a sore throat and a lot of nasal congestion.  Woke up this morning around 2:30 - 5 AM, and found out yesterday that a member of his church's choir was positive for Covid.    Chills and generally does not feel well.  Did have a case of diarrhea on Sunday, and a lot of gas.    Lab Results  Component Value Date   HGBA1C 6.0 02/09/2022    He is afebrile. He is eating and drinking normally. No neurological symptoms  Review of Systems as above: See pertinent positives and pertinent negatives per HPI No acute distress verbally   Observations/Objective/Exam:  An  attempt was made to discern vital signs over the phone and per patient if applicable and possible.   General:    Alert, Oriented, appears well and in no acute distress  Pulmonary:     On inspection no signs of respiratory distress.  Psych / Neurological:     Pleasant and cooperative.  Assessment and Plan:    ICD-10-CM   1. COVID-19  U07.1      Given risk factors and age, we will treat him with Paxlovid.  Otherwise continue with supportive care, we reviewed quarantine protocols.  I discussed the assessment and treatment plan with the patient. The patient was provided an opportunity to ask questions and all were answered. The patient agreed with the plan and demonstrated an understanding of the instructions.   The patient was advised to call back or seek an in-person evaluation if the symptoms worsen or if the condition fails to improve as anticipated.  Follow-up: prn unless noted otherwise below No follow-ups on file.  No orders of the defined types were placed in this encounter.  No orders of the defined types were placed in this encounter.   Signed,  Maud Deed. Caidynce Muzyka, MD

## 2022-03-20 ENCOUNTER — Ambulatory Visit (INDEPENDENT_AMBULATORY_CARE_PROVIDER_SITE_OTHER): Payer: BC Managed Care – PPO | Admitting: Nurse Practitioner

## 2022-03-20 VITALS — BP 114/66 | HR 63 | Temp 96.9°F | Resp 16 | Ht 72.0 in | Wt 299.0 lb

## 2022-03-20 DIAGNOSIS — J020 Streptococcal pharyngitis: Secondary | ICD-10-CM | POA: Insufficient documentation

## 2022-03-20 DIAGNOSIS — U071 COVID-19: Secondary | ICD-10-CM | POA: Diagnosis not present

## 2022-03-20 DIAGNOSIS — J029 Acute pharyngitis, unspecified: Secondary | ICD-10-CM | POA: Insufficient documentation

## 2022-03-20 LAB — POCT RAPID STREP A (OFFICE): Rapid Strep A Screen: POSITIVE — AB

## 2022-03-20 MED ORDER — AMOXICILLIN 500 MG PO CAPS: 500.0000 mg | ORAL_CAPSULE | Freq: Two times a day (BID) | ORAL | 0 refills | Status: AC

## 2022-03-20 NOTE — Assessment & Plan Note (Signed)
Strep test positive in office.  We will treat patient with amoxicillin 500 mg twice daily for 10 days.  Follow-up if no improvement

## 2022-03-20 NOTE — Assessment & Plan Note (Signed)
Strep test in office. 

## 2022-03-20 NOTE — Assessment & Plan Note (Signed)
Patient is 12 days post testing positive for COVID-19.  States she still testing positive.  Did inform patient depending on the test could test positive for 90 days but not be infectious.  Patient was treated with antiviral medication

## 2022-03-20 NOTE — Progress Notes (Signed)
Acute Office Visit  Subjective:     Patient ID: Glenn Lyons, male    DOB: [birth year], 66 y.o.   MRN: [id number]  Chief Complaint  Patient presents with   Nasal Congestion    Runny nose, sneezing, chest congestion, this started 03/18/22, chest tightness, malaise. Still testing positive for Covid. Some post nasal drip. Energy level is better since 1st testing positive for Covid. No fever.    HPI Patient is in today for Sick symptoms   Patient tested positive for covid on 03/08/2022 and was treated with paxlovid. [facility] and tested again on 03/14/2022 and tested positive still  He is here today for evaluation and concern for delevoping a pna. States that he has had pna approx 5-6 years ago he was hospitalixed for several times.  States that he did not have any sympotms since Monday. States on Friday he started having some draingage and congestion. Increased Saturday and Sunday was the worse. States that he feels a 100% better today  Using cough drops, and benadryl with some relief   Review of Systems  Constitutional:  Positive for malaise/fatigue. Negative for chills (hot and cold) and fever.  HENT:  Positive for sinus pain. Negative for ear discharge, ear pain and sore throat ("odd" started sore and then resovled).        "+" PND  Respiratory:  Positive for cough and sputum production. Negative for shortness of breath.   Cardiovascular:  Negative for chest pain.       Chest tightness   Musculoskeletal:  Negative for joint pain and myalgias.  Neurological:  Positive for headaches.        Objective:    BP 114/66   Pulse 63   Temp (!) 96.9 F (36.1 C)   Resp 16   Ht 6' (1.829 m)   Wt 299 lb (135.6 kg)   SpO2 96%   BMI 40.55 kg/m    Physical Exam Vitals and nursing note reviewed.  Constitutional:      Appearance: Normal appearance. He is obese.  HENT:     Right Ear: Tympanic membrane, ear canal and external ear normal.     Left Ear: Tympanic  membrane, ear canal and external ear normal.     Mouth/Throat:     Mouth: Mucous membranes are moist.     Pharynx: Posterior oropharyngeal erythema present.  Cardiovascular:     Rate and Rhythm: Normal rate and regular rhythm.     Pulses: Normal pulses.     Heart sounds: Normal heart sounds.  Pulmonary:     Effort: Pulmonary effort is normal.     Breath sounds: Normal breath sounds.  Lymphadenopathy:     Cervical: Cervical adenopathy present.  Neurological:     Mental Status: He is alert.     No results found for any visits on 03/20/22.      Assessment & Plan:   Problem List Items Addressed This Visit       Respiratory   Strep throat    Strep test positive in office.  We will treat patient with amoxicillin 500 mg twice daily for 10 days.  Follow-up if no improvement      Relevant Medications   amoxicillin (AMOXIL) 500 MG capsule     Other   Sore throat    Strep test in office.      Relevant Orders   Rapid Strep A   COVID-19 - Primary    Patient is  12 days post testing positive for COVID-19.  States she still testing positive.  Did inform patient depending on the test could test positive for 90 days but not be infectious.  Patient was treated with antiviral medication       Meds ordered this encounter  Medications   amoxicillin (AMOXIL) 500 MG capsule    Sig: Take 1 capsule (500 mg total) by mouth 2 (two) times daily for 10 days.    Dispense:  20 capsule    Refill:  0    Order Specific Question:   Supervising Provider    Answer:   TOWER, MARNE A [[id number]    Return if symptoms worsen or fail to improve.  Romilda Garret, NP

## 2022-03-20 NOTE — Patient Instructions (Signed)
Nice to see you today Strep test was positive in office I have sent in antibiotics to treat the infection Follow up if no improvement

## 2022-03-21 ENCOUNTER — Ambulatory Visit: Payer: BC Managed Care – PPO | Admitting: Family Medicine

## 2022-04-04 ENCOUNTER — Ambulatory Visit: Payer: BC Managed Care – PPO | Admitting: Family Medicine

## 2022-04-04 ENCOUNTER — Encounter: Payer: Self-pay | Admitting: Family Medicine

## 2022-04-04 VITALS — BP 138/72 | HR 70 | Temp 97.4°F | Ht 72.0 in | Wt 303.0 lb

## 2022-04-04 DIAGNOSIS — F419 Anxiety disorder, unspecified: Secondary | ICD-10-CM

## 2022-04-04 NOTE — Progress Notes (Unsigned)
Anxiety d/w pt.  Recent events d/w pt.  He had covid last month, eventually tested negative.  Finished amoxil for strep throat in the meantime.  He has been out of work in the meantime. Knee surgery is pending.  Being out of work is an issue for patient, ie being out of his routine.  He is putting up with knee pain at baseline.  No suicidal homicidal intent.  He talked about the fact that his wife has noted him having mood changes in the midst of all of the people.  Meds, vitals, and allergies reviewed.   ROS: Per HPI unless specifically indicated in ROS section   Nad Ncat Neck supple, no LA Rrr Ctab Abd soft, not ttp Skin well perfused. Walking with a limp from right knee pain.

## 2022-04-04 NOTE — Patient Instructions (Signed)
If your mood is worse in the meantime then let me know.  Take care.  Glad to see you.

## 2022-04-05 MED ORDER — TRAMADOL HCL 50 MG PO TABS: 50.0000 mg | ORAL_TABLET | Freq: Three times a day (TID) | ORAL | Status: DC | PRN

## 2022-04-05 MED ORDER — BUSPIRONE HCL 5 MG PO TABS: 5.0000 mg | ORAL_TABLET | Freq: Two times a day (BID) | ORAL | Status: DC

## 2022-04-05 NOTE — Assessment & Plan Note (Signed)
He likely has symptoms related to work disruption (he is motivated to be at work), lack of travel (which is usually part of his work), and pain.  Discussed options.  He is able to anticipate his surgical outcome from his knee and he has plans to get that addressed, i.e. has surgery planned.  He is not suicidal or homicidal and he is okay for outpatient follow-up.  We talked about options.  He is likely going to be on postop pain medication i.e. tramadol.  He may be able to use either hydroxyzine and/or BuSpar for anxiety if needed.  We opted not to start medication at this point but he will monitor his mood and update me as needed.  He agrees to plan. 30 minutes were devoted to patient care in this encounter (this includes time spent reviewing the patient's file/history, interviewing and examining the patient, counseling/reviewing plan with patient).

## 2022-04-14 NOTE — Patient Instructions (Signed)
SURGICAL WAITING ROOM VISITATION Patients having surgery or a procedure may have no more than 2 support people in the waiting area - these visitors may rotate.   Children under the age of 73 must have an adult with them who is not the patient. If the patient needs to stay at the hospital during part of their recovery, the visitor guidelines for inpatient rooms apply. Pre-op nurse will coordinate an appropriate time for 1 support person to accompany patient in pre-op.  This support person may not rotate.    Please refer to the [facility] website for the visitor guidelines for Inpatients (after your surgery is over and you are in a regular room).    Your procedure is scheduled on: 04/24/22   Report to [facility] Main Entrance    Report to admitting at 5:15 AM   Call this number if you have problems the morning of surgery [phone]   Do not eat food :After Midnight.   After Midnight you may have the following liquids until 4:15 AM DAY OF SURGERY  Water Non-Citrus Juices (without pulp, NO RED) Carbonated Beverages Black Coffee (NO MILK/CREAM OR CREAMERS, sugar ok)  Clear Tea (NO MILK/CREAM OR CREAMERS, sugar ok) regular and decaf                             Plain Jell-O (NO RED)                                           Fruit ices (not with fruit pulp, NO RED)                                     Popsicles (NO RED)                                                               Sports drinks like Gatorade (NO RED)   The day of surgery:  Drink ONE (1) Pre-Surgery G2 at 4:15 AM the morning of surgery. Drink in one sitting. Do not sip.  This drink was given to you during your hospital  pre-op appointment visit. Nothing else to drink after completing the  Pre-Surgery G2.          If you have questions, please contact your surgeon's office.   FOLLOW BOWEL PREP AND ANY ADDITIONAL PRE OP INSTRUCTIONS YOU RECEIVED FROM YOUR SURGEON'S OFFICE!!!     Oral Hygiene is also  important to reduce your risk of infection.                                    Remember - BRUSH YOUR TEETH THE MORNING OF SURGERY WITH YOUR REGULAR TOOTHPASTE   Take these medicines the morning of surgery with A SIP OF WATER: Tylenol  DO NOT TAKE ANY ORAL DIABETIC MEDICATIONS DAY OF YOUR SURGERY  How to Manage Your Diabetes Before and After Surgery  Why is it important to control my blood sugar before and after  surgery? Improving blood sugar levels before and after surgery helps healing and can limit problems. A way of improving blood sugar control is eating a healthy diet by:  Eating less sugar and carbohydrates  Increasing activity/exercise  Talking with your doctor about reaching your blood sugar goals High blood sugars (greater than 180 mg/dL) can raise your risk of infections and slow your recovery, so you will need to focus on controlling your diabetes during the weeks before surgery. Make sure that the doctor who takes care of your diabetes knows about your planned surgery including the date and location.  How do I manage my blood sugar before surgery? Check your blood sugar at least 4 times a day, starting 2 days before surgery, to make sure that the level is not too high or low. Check your blood sugar the morning of your surgery when you wake up and every 2 hours until you get to the Short Stay unit. If your blood sugar is less than 70 mg/dL, you will need to treat for low blood sugar: Do not take insulin. Treat a low blood sugar (less than 70 mg/dL) with  cup of clear juice (cranberry or apple), 4 glucose tablets, OR glucose gel. Recheck blood sugar in 15 minutes after treatment (to make sure it is greater than 70 mg/dL). If your blood sugar is not greater than 70 mg/dL on recheck, call [phone] for further instructions. Report your blood sugar to the short stay nurse when you get to Short Stay.  If you are admitted to the hospital after surgery: Your blood sugar will be  checked by the staff and you will probably be given insulin after surgery (instead of oral diabetes medicines) to make sure you have good blood sugar levels. The goal for blood sugar control after surgery is 80-180 mg/dL.  Reviewed and Endorsed by [facility] Patient Education Committee, August 2015  Bring CPAP mask and tubing day of surgery.                              You may not have any metal on your body including  jewelry, and body piercing             Do not wear lotions, powders, cologne, or deodorant              Men may shave face and neck.   Do not bring valuables to the hospital. [place].   Bring small overnight bag day of surgery.   DO NOT [place]. PHARMACY WILL DISPENSE MEDICATIONS LISTED ON YOUR MEDICATION LIST TO YOU DURING YOUR ADMISSION [place]!   Special Instructions: Bring a copy of your healthcare power of attorney and living will documents the day of surgery if you haven't scanned them before.              Please read over the following fact sheets you were given: IF [place] [name]   If you received a COVID test during your pre-op visit  it is requested that you wear a mask when out in public, stay away from anyone that may not be feeling well and notify your surgeon if you develop symptoms. If you test positive for Covid or have been in contact with  anyone that has tested positive in the last 10 days please notify you surgeon.    [place] - Preparing for Surgery Before surgery, you can play an important role.  Because skin is not sterile, your skin needs to be as free of germs as possible.  You can reduce the number of germs on your skin by washing with CHG (chlorahexidine gluconate) soap before surgery.  CHG is an antiseptic cleaner which kills germs and bonds with the skin to continue killing germs  even after washing. Please DO NOT use if you have an allergy to CHG or antibacterial soaps.  If your skin becomes reddened/irritated stop using the CHG and inform your nurse when you arrive at Short Stay. Do not shave (including legs and underarms) for at least 48 hours prior to the first CHG shower.  You may shave your face/neck.  Please follow these instructions carefully:  1.  Shower with CHG Soap the night before surgery and the  morning of surgery.  2.  If you choose to wash your hair, wash your hair first as usual with your normal  shampoo.  3.  After you shampoo, rinse your hair and body thoroughly to remove the shampoo.                             4.  Use CHG as you would any other liquid soap.  You can apply chg directly to the skin and wash.  Gently with a scrungie or clean washcloth.  5.  Apply the CHG Soap to your body ONLY FROM THE NECK DOWN.   Do   not use on face/ open                           Wound or open sores. Avoid contact with eyes, ears mouth and   genitals (private parts).                       Wash face,  Genitals (private parts) with your normal soap.             6.  Wash thoroughly, paying special attention to the area where your    surgery  will be performed.  7.  Thoroughly rinse your body with warm water from the neck down.  8.  DO NOT shower/wash with your normal soap after using and rinsing off the CHG Soap.                9.  Pat yourself dry with a clean towel.            10.  Wear clean pajamas.            11.  Place clean sheets on your bed the night of your first shower and do not  sleep with pets. Day of Surgery : Do not apply any lotions/deodorants the morning of surgery.  Please wear clean clothes to the hospital/surgery center.  FAILURE TO FOLLOW THESE INSTRUCTIONS MAY RESULT IN THE CANCELLATION OF YOUR SURGERY  PATIENT SIGNATURE_________________________________  NURSE  SIGNATURE__________________________________  ________________________________________________________________________  Adam Phenix  An incentive spirometer is a tool that can help keep your lungs clear and active. This tool measures how well you are filling your lungs with each breath. Taking long deep breaths may help reverse or decrease the chance of developing breathing (pulmonary) problems (especially infection) following: A long period  of time when you are unable to move or be active. BEFORE THE PROCEDURE  If the spirometer includes an indicator to show your best effort, your nurse or respiratory therapist will set it to a desired goal. If possible, sit up straight or lean slightly forward. Try not to slouch. Hold the incentive spirometer in an upright position. INSTRUCTIONS FOR USE  Sit on the edge of your bed if possible, or sit up as far as you can in bed or on a chair. Hold the incentive spirometer in an upright position. Breathe out normally. Place the mouthpiece in your mouth and seal your lips tightly around it. Breathe in slowly and as deeply as possible, raising the piston or the ball toward the top of the column. Hold your breath for 3-5 seconds or for as long as possible. Allow the piston or ball to fall to the bottom of the column. Remove the mouthpiece from your mouth and breathe out normally. Rest for a few seconds and repeat Steps 1 through 7 at least 10 times every 1-2 hours when you are awake. Take your time and take a few normal breaths between deep breaths. The spirometer may include an indicator to show your best effort. Use the indicator as a goal to work toward during each repetition. After each set of 10 deep breaths, practice coughing to be sure your lungs are clear. If you have an incision (the cut made at the time of surgery), support your incision when coughing by placing a pillow or rolled up towels firmly against it. Once you are able to get out of  bed, walk around indoors and cough well. You may stop using the incentive spirometer when instructed by your caregiver.  RISKS AND COMPLICATIONS Take your time so you do not get dizzy or light-headed. If you are in pain, you may need to take or ask for pain medication before doing incentive spirometry. It is harder to take a deep breath if you are having pain. AFTER USE Rest and breathe slowly and easily. It can be helpful to keep track of a log of your progress. Your caregiver can provide you with a simple table to help with this. If you are using the spirometer at home, follow these instructions: [place] IF:  You are having difficultly using the spirometer. You have trouble using the spirometer as often as instructed. Your pain medication is not giving enough relief while using the spirometer. You develop fever of 100.5 F (38.1 C) or higher. SEEK IMMEDIATE MEDICAL CARE IF:  You cough up bloody sputum that had not been present before. You develop fever of 102 F (38.9 C) or greater. You develop worsening pain at or near the incision site. MAKE SURE YOU:  Understand these instructions. Will watch your condition. Will get help right away if you are not doing well or get worse. Document Released: 09/25/2006 Document Revised: 08/07/2011 Document Reviewed: 11/26/2006 [facility] Patient Information 2014 [place], [place].   ________________________________________________________________________

## 2022-04-14 NOTE — Progress Notes (Signed)
COVID Vaccine Completed: yes  Date of COVID positive in last 90 days:  PCP - Elsie Stain, MD Cardiologist - Selena Batten, MD  Cardiac clearance by Diona Browner 02/22/22 in Epic   Chest x-ray -  EKG - 02/09/22 Epic Stress Test - 11/13/19 Epic ECHO - 2018 Cardiac Cath -  Pacemaker/ICD device last checked: 01/16/22 Epic Spinal Cord Stimulator:  Bowel Prep -   Sleep Study -  CPAP -   Fasting Blood Sugar -  Checks Blood Sugar _____ times a day  Last dose of GLP1 agonist-  N/A GLP1 instructions:  N/A   Last dose of SGLT-2 inhibitors-  N/A SGLT-2 instructions: N/A   Blood Thinner Instructions: Aspirin Instructions: Last Dose:  Activity level:  Can go up a flight of stairs and perform activities of daily living without stopping and without symptoms of chest pain or shortness of breath.  Able to exercise without symptoms  Unable to go up a flight of stairs without symptoms of     Anesthesia review:  HTN, OSA, DM2, dysphagia, pacemaker, OSA, a fib  Patient denies shortness of breath, fever, cough and chest pain at PAT appointment  Patient verbalized understanding of instructions that were given to them at the PAT appointment. Patient was also instructed that they will need to review over the PAT instructions again at home before surgery.

## 2022-04-17 ENCOUNTER — Encounter (HOSPITAL_COMMUNITY)
Admission: RE | Admit: 2022-04-17 | Discharge: 2022-04-17 | Disposition: A | Discharge status: Home / Self Care | Payer: BC Managed Care – PPO | Source: Ambulatory Visit | Attending: Orthopedic Surgery | Admitting: Orthopedic Surgery

## 2022-04-17 ENCOUNTER — Encounter (HOSPITAL_COMMUNITY): Payer: Self-pay

## 2022-04-17 ENCOUNTER — Encounter: Payer: Self-pay | Admitting: Internal Medicine

## 2022-04-17 ENCOUNTER — Encounter (INDEPENDENT_AMBULATORY_CARE_PROVIDER_SITE_OTHER): Payer: BC Managed Care – PPO

## 2022-04-17 VITALS — HR 59 | Temp 97.9°F | Resp 16 | Ht 72.0 in | Wt 303.0 lb

## 2022-04-17 DIAGNOSIS — R001 Bradycardia, unspecified: Secondary | ICD-10-CM

## 2022-04-17 DIAGNOSIS — Z01818 Encounter for other preprocedural examination: Secondary | ICD-10-CM | POA: Insufficient documentation

## 2022-04-17 DIAGNOSIS — E1140 Type 2 diabetes mellitus with diabetic neuropathy, unspecified: Secondary | ICD-10-CM | POA: Diagnosis not present

## 2022-04-17 HISTORY — DX: Essential (primary) hypertension: I10

## 2022-04-17 LAB — BASIC METABOLIC PANEL
Anion gap: 8 (ref 5–15)
BUN: 11 mg/dL (ref 8–23)
CO2: 25 mmol/L (ref 22–32)
Calcium: 9.5 mg/dL (ref 8.9–10.3)
Chloride: 107 mmol/L (ref 98–111)
Creatinine, Ser: 0.84 mg/dL (ref 0.61–1.24)
GFR, Estimated: >60 mL/min (ref >60)
Glucose, Bld: 119 mg/dL — ABNORMAL HIGH (ref 70–99)
Potassium: 4.2 mmol/L (ref 3.5–5.1)
Sodium: 140 mmol/L (ref 135–145)

## 2022-04-17 LAB — CBC
HCT: 47.5 % (ref 39.0–52.0)
Hemoglobin: 15.7 g/dL (ref 13.0–17.0)
MCH: 31.3 pg (ref 26.0–34.0)
MCHC: 33.1 g/dL (ref 30.0–36.0)
MCV: 94.8 fL (ref 80.0–100.0)
Platelets: 235 10*3/uL (ref 150–400)
RBC: 5.01 MIL/uL (ref 4.22–5.81)
RDW: 13.4 % (ref 11.5–15.5)
WBC: 8.7 10*3/uL (ref 4.0–10.5)
nRBC: 0 % (ref 0.0–0.2)

## 2022-04-17 LAB — SURGICAL PCR SCREEN
MRSA, PCR: NEGATIVE
Staphylococcus aureus: NEGATIVE

## 2022-04-17 LAB — GLUCOSE, CAPILLARY: Glucose-Capillary: 117 mg/dL — ABNORMAL HIGH (ref 70–99)

## 2022-04-17 NOTE — H&P (Signed)
TOTAL KNEE ADMISSION H&P  Patient is being admitted for right total knee arthroplasty.  Subjective:  Chief Complaint: Right knee pain.  HPI: Glenn Lyons, 66 y.o. male has a history of pain and functional disability in the right knee due to arthritis and has failed non-surgical conservative treatments for greater than 12 weeks to include weight reduction as appropriate and activity modification. Onset of symptoms was gradual, starting  several  years ago with gradually worsening course since that time. The patient noted no past surgery on the right knee.  Patient currently rates pain in the right knee at 8 out of 10 with activity. Patient has night pain, worsening of pain with activity and weight bearing, pain with passive range of motion, and crepitus. Patient has evidence of  bone-on-bone arthritis in the medial compartment and a varus deformity  by imaging studies. There is no active infection.  Patient Active Problem List   Diagnosis Date Noted   Sore throat 03/20/2022   Strep throat 03/20/2022   COVID-19 03/20/2022   Primary osteoarthritis of left knee 08/15/2021   Double vision 01/05/2021   S/P gastric bypass 04/05/2020   Need for shingles vaccine 01/28/2020   Foreskin problem 08/10/2019   HLD (hyperlipidemia) 08/05/2017   Routine general medical examination at a health care facility 07/13/2016   Advance care planning 07/13/2016   Type 2 diabetes mellitus with diabetic neuropathy, unspecified ([place]) 03/17/2016   Lower urinary tract symptoms (LUTS) 03/16/2016   Dysphagia    Essential hypertension    Hx of colonic polyps    Fatigue 08/04/2014   Rash and nonspecific skin eruption 10/21/2013   Morbid obesity with BMI of 50.0-59.9, adult ([place]) 05/24/2013   History of colonic polyps 04/29/2013   Anxiety 03/10/2013   Pain in joint, shoulder region 02/08/2013   Sinus bradycardia /pauses    Edema 01/31/2012   OA (osteoarthritis) of knee 06/15/2011   Paresthesia 01/08/2011    Achalasia 08/17/2010   Arthritis of knee 01/21/2010   PACEMAKER, [employer] Judes 01/21/2010   Obesity 12/07/2009   OSA (obstructive sleep apnea) 12/07/2009    Past Medical History:  Diagnosis Date   Achalasia    with prev eval at [facility].  No intervention as of 2012   Arthritis    Aspiration pneumonia (HCC)    Backache, unspecified    Cardiac pacemaker St. Jude    ERI 2008   Depressive disorder, not elsewhere classified    Diabetes ([place])    Hypertension    Morbid obesity ([place])    Obstructive sleep apnea    biapap settign 25-22   Presence of permanent cardiac pacemaker    St. Jude- Dr. Caryl Comes follows -device Check 07-06-14   Sinus bradycardia /pauses    Stricture and stenosis of esophagus     Past Surgical History:  Procedure Laterality Date   BALLOON DILATION N/A 10/06/2015   Procedure: BALLOON DILATION;  Surgeon: Mauri Pole, MD;  Location: [place] [facility];  Service: Endoscopy;  Laterality: N/A;   BIOPSY  05/06/2018   Procedure: BIOPSY;  Surgeon: Rush Landmark Telford Nab., MD;  Location: [place];  Service: Gastroenterology;;   COLONOSCOPY W/ POLYPECTOMY  11/01/2010   COLONOSCOPY WITH PROPOFOL N/A 10/05/2014   Procedure: COLONOSCOPY WITH PROPOFOL;  Surgeon: Gatha Mayer, MD;  [facility];  Service: Endoscopy;  Laterality: N/A;   COLONOSCOPY WITH PROPOFOL N/A 05/06/2018   Procedure: COLONOSCOPY WITH PROPOFOL;  Surgeon: Rush Landmark Telford Nab., MD;  Location: [place];  Service: Gastroenterology;  Laterality: N/A;  ESOPHAGEAL DILATION     ESOPHAGEAL MANOMETRY N/A 08/23/2015   Procedure: ESOPHAGEAL MANOMETRY (EM);  Surgeon: Mauri Pole, MD;  [facility];  Service: Endoscopy;  Laterality: N/A;   ESOPHAGOGASTRODUODENOSCOPY N/A 10/09/2014   Procedure: ESOPHAGOGASTRODUODENOSCOPY (EGD);  Surgeon: Gatha Mayer, MD;  [facility];  Service: Endoscopy;  Laterality: N/A;   ESOPHAGOGASTRODUODENOSCOPY (EGD) WITH PROPOFOL N/A 08/23/2015   Procedure:  ESOPHAGOGASTRODUODENOSCOPY (EGD) WITH PROPOFOL;  Surgeon: Mauri Pole, MD;  [facility];  Service: Endoscopy;  Laterality: N/A;   ESOPHAGOGASTRODUODENOSCOPY (EGD) WITH PROPOFOL N/A 10/06/2015   Procedure: ESOPHAGOGASTRODUODENOSCOPY (EGD) WITH PROPOFOL;  Surgeon: Mauri Pole, MD;  Location: [place] [facility];  Service: Endoscopy;  Laterality: N/A;  Rigiflex ballon size 56m-35mm size 45 minute proc,need Fluro Gastografin esophagram 2 hrs post EGD    GASTRIC ROUX-EN-Y N/A 04/05/2020   Procedure: LAPAROSCOPIC ROUX-EN-Y GASTRIC BYPASS WITH UPPER ENDOSCOPY;  Surgeon: WGreer Pickerel MD;  [facility];  Service: General;  Laterality: N/A;   PACEMAKER INSERTION     POLYPECTOMY  05/06/2018   Procedure: POLYPECTOMY;  Surgeon: MIrving Copas, MD;  [place]  Service: Gastroenterology;;   PBetsy PriesGENERATOR CHANGEOUT N/A 05/01/2018   Procedure: PPM GENERATOR CHANGEOUT;  Surgeon: KDeboraha Sprang MD;  [facility];  Service: Cardiovascular;  Laterality: N/A;   TOTAL KNEE ARTHROPLASTY Left 08/15/2021   Procedure: TOTAL KNEE ARTHROPLASTY;  Surgeon: AGaynelle Arabian MD;  [facility];  Service: Orthopedics;  Laterality: Left;    Prior to Admission medications   Medication Sig Start Date End Date Taking? Authorizing Provider  acetaminophen (TYLENOL) 500 MG tablet Take 1,000 mg by mouth every 6 (six) hours as needed for moderate pain.   Yes [provider]  Aromatic Inhalants (VICKS VAPOR INHALER IN) Place 1 spray into both nostrils daily as needed (congestion).   Yes [provider]  CALCIUM CITRATE PO Take 1,500 mg by mouth daily.   Yes [provider]  Multiple Vitamin (MULTIVITAMIN) tablet Take 2 tablets by mouth daily. Bariatric Advantage Multivitamin   Yes [provider]  sildenafil (VIAGRA) 50 MG tablet Take 50 mg by mouth daily as needed for erectile dysfunction.   Yes [provider]  Blood Glucose  Monitoring Suppl (ONETOUCH VERIO) w/Device KIT Inject 1 kit into the skin every morning. Use to test blood sugar each day before breakfast and 2 hours after a meal for 2 weeks.  Diagnosis:  E11.9  Non-insulin dependent. 06/13/16   DTonia Ghent MD  Lancets ([facility]) lancets USE TO TEST BLOOD SUGAR ONCE DAILY OR AS NEEDED 03/12/17   DTonia Ghent MD  [facility] test strip USE TO TEST BLOOD SUGAR ONCE DAILY OR AS INSTRUCTED 10/04/20   DTonia Ghent MD    Allergies  Allergen Reactions   Morphine Other (See Comments)    Cardiac Arrest   Metformin And Related     Intolerant of any dose due to aches    Social History   Socioeconomic History   Marital status: Married    Spouse name: Not on file   Number of children: 2   Years of education: Not on file   Highest education level: Not on file  Occupational History   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   Occupation: singer    Employer: [employer]  Tobacco Use   Smoking status: Former    Packs/day: 1.00    Years: 20.00    Total pack years: 20.00  Types: Cigarettes    Quit date: 05/29/2004    Years since quitting: 17.8   Smokeless tobacco: Never  Vaping Use   Vaping Use: Never used  Substance and Sexual Activity   Alcohol use: Never   Drug use: No   Sexual activity: Yes  Other Topics Concern   Not on file  Social History Narrative   From Air cabin crew   Married   Social Determinants of Health   Financial Resource Strain: Not on file  Food Insecurity: Not on file  Transportation Needs: Not on file  Physical Activity: Not on file  Stress: Not on file  Social Connections: Not on file  Intimate Partner Violence: Not on file    Tobacco Use: Medium Risk (04/17/2022)   Patient History    Smoking Tobacco Use: Former    Smokeless Tobacco Use: Never    Passive Exposure: Not on file   Social History   Substance and Sexual Activity  Alcohol Use Never    Family History  Problem Relation Age  of Onset   Heart attack Father    Heart disease Father    Cancer Father        multiple myeloma   Hypertension Father    Prostate cancer Father        possible dx   Hypertension Mother    Dementia Mother    Colon cancer Neg Hx     Review of Systems  Constitutional:  Negative for chills and fever.  HENT:  Negative for congestion, sore throat and tinnitus.   Eyes:  Negative for double vision, photophobia and pain.  Respiratory:  Negative for cough, shortness of breath and wheezing.   Cardiovascular:  Negative for chest pain, palpitations and orthopnea.  Gastrointestinal:  Negative for heartburn, nausea and vomiting.  Genitourinary:  Negative for dysuria, frequency and urgency.  Musculoskeletal:  Positive for joint pain.  Neurological:  Negative for dizziness, weakness and headaches.    Objective:  Physical Exam: Well nourished and well developed.  General: Alert and oriented x3, cooperative and pleasant, no acute distress.  Head: normocephalic, atraumatic, neck supple.  Eyes: EOMI.  Musculoskeletal:  Right knee Trace effusion Varus deformity Range 5-125 Tender medial greater than lateral  No instability  Calves soft and nontender. Motor function intact in LE. Strength 5/5 LE bilaterally. Neuro: Distal pulses 2+. Sensation to light touch intact in LE.  Vital signs in last 24 hours: Temp:  [97.9 F (36.6 C)] 97.9 F (36.6 C) (11/20 1056) Pulse Rate:  [59] 59 (11/20 1056) Resp:  [16] 16 (11/20 1056) SpO2:  [98 %] 98 % (11/20 1056) Weight:  [137.4 kg] 137.4 kg (11/20 1056)  Imaging Review Plain radiographs demonstrate severe degenerative joint disease of the right knee. The overall alignment is neutral. The bone quality appears to be adequate for age and reported activity level.  Assessment/Plan:  End stage arthritis, right knee   The patient history, physical examination, clinical judgment of the provider and imaging studies are consistent with end stage  degenerative joint disease of the right knee and total knee arthroplasty is deemed medically necessary. The treatment options including medical management, injection therapy arthroscopy and arthroplasty were discussed at length. The risks and benefits of total knee arthroplasty were presented and reviewed. The risks due to aseptic loosening, infection, stiffness, patella tracking problems, thromboembolic complications and other imponderables were discussed. The patient acknowledged the explanation, agreed to proceed with the plan and consent was signed. Patient is being admitted  for inpatient treatment for surgery, pain control, PT, OT, prophylactic antibiotics, VTE prophylaxis, progressive ambulation and ADLs and discharge planning. The patient is planning to be discharged  home .   Therapy Plans: Outpatient therapy at [facility]) Disposition: Home with wife Planned DVT Prophylaxis: Xarelto 10 mg QD DME Needed: None PCP: Elsie Stain, MD (clearance received) Cardiologist: Virl Axe, MD (clearance received) TXA: IV Allergies: Morphine Anesthesia Concerns: None Last HgbA1c: 6.0% (02/09/22) Pharmacy: [facility])  Other: - Hx bariatric surgery  - Patient was instructed on what medications to stop prior to surgery. - Follow-up visit in 2 weeks with Dr. Wynelle Link - Begin physical therapy following surgery - Pre-operative lab work as pre-surgical testing - Prescriptions will be provided in hospital at time of discharge  Theresa Duty, PA-C Orthopedic Surgery [facility] [place]

## 2022-04-17 NOTE — Progress Notes (Signed)
PERIOPERATIVE PRESCRIPTION FOR IMPLANTED CARDIAC DEVICE PROGRAMMING  Patient Information: Name:  Glenn Lyons  DOB:  [birth year]  MRN:  [id number]    Planned Procedure:  right total knee arthroplasty  Surgeon:  Dr. Wynelle Link  Date of Procedure:  04/24/22  Cautery will be used.  Position during surgery:  unknown   Please send documentation back to:  [facility] (Fax # [phone])  Device Information:  Clinic EP Physician:  Virl Axe, MD   Device Type:  Pacemaker Manufacturer and Phone #:  St. Jude/Abbott: [phone] Pacemaker Dependent?:  No. Date of Last Device Check:  04/17/2022 Normal Device Function?:  Yes.    Electrophysiologist's Recommendations:  Have magnet available. Provide continuous ECG monitoring when magnet is used or reprogramming is to be performed.  Procedure should not interfere with device function.  No device programming or magnet placement needed.  Per Device Clinic Standing Orders, Simone Curia, RN  8:55 AM 04/17/2022

## 2022-04-18 LAB — CUP PACEART REMOTE DEVICE CHECK
Battery Remaining Longevity: 83 mo
Battery Remaining Percentage: 68 %
Battery Voltage: 2.99 V
Brady Statistic AP VP Percent: 1 %
Brady Statistic AP VS Percent: 61 %
Brady Statistic AS VP Percent: 1 %
Brady Statistic AS VS Percent: 38 %
Brady Statistic RA Percent Paced: 61 %
Brady Statistic RV Percent Paced: 1 %
Date Time Interrogation Session: 20231120030916
Implantable Lead Connection Status: 753985
Implantable Lead Connection Status: 753985
Implantable Lead Implant Date: 20061219
Implantable Lead Implant Date: 20061219
Implantable Lead Location: 753859
Implantable Lead Location: 753860
Implantable Pulse Generator Implant Date: 20191204
Lead Channel Impedance Value: 1175 Ohm
Lead Channel Impedance Value: 450 Ohm
Lead Channel Pacing Threshold Amplitude: 1.125 V
Lead Channel Pacing Threshold Amplitude: 1.75 V
Lead Channel Pacing Threshold Pulse Width: 0.5 ms
Lead Channel Pacing Threshold Pulse Width: 0.8 ms
Lead Channel Sensing Intrinsic Amplitude: 5 mV
Lead Channel Sensing Intrinsic Amplitude: 9 mV
Lead Channel Setting Pacing Amplitude: 1.375
Lead Channel Setting Pacing Amplitude: 3.25 V
Lead Channel Setting Pacing Pulse Width: 0.5 ms
Lead Channel Setting Sensing Sensitivity: 2.5 mV
Pulse Gen Model: 2272
Pulse Gen Serial Number: 9090970

## 2022-04-18 LAB — HEMOGLOBIN A1C
Hgb A1c MFr Bld: 6.2 % — ABNORMAL HIGH (ref 4.8–5.6)
Mean Plasma Glucose: 131 mg/dL

## 2022-04-18 NOTE — Anesthesia Preprocedure Evaluation (Addendum)
Anesthesia Evaluation  Patient identified by MRN, date of birth, ID band Patient awake    Reviewed: Allergy & Precautions, NPO status , Patient's Chart, lab work & pertinent test results  Airway Mallampati: II  TM Distance: >3 FB Neck ROM: Full    Dental  (+) Dental Advisory Given   Pulmonary sleep apnea and Continuous Positive Airway Pressure Ventilation , former smoker   breath sounds clear to auscultation       Cardiovascular hypertension, + pacemaker  Rhythm:Regular Rate:Normal     Neuro/Psych negative neurological ROS     GI/Hepatic negative GI ROS, Neg liver ROS,,,  Endo/Other  diabetes  Morbid obesity  Renal/GU negative Renal ROS     Musculoskeletal  (+) Arthritis ,    Abdominal   Peds  Hematology negative hematology ROS (+)   Anesthesia Other Findings   Reproductive/Obstetrics                              Lab Results  Component Value Date   WBC 8.7 04/17/2022   HGB 15.7 04/17/2022   HCT 47.5 04/17/2022   MCV 94.8 04/17/2022   PLT 235 04/17/2022   Lab Results  Component Value Date   CREATININE 0.84 04/17/2022   BUN 11 04/17/2022   NA 140 04/17/2022   K 4.2 04/17/2022   CL 107 04/17/2022   CO2 25 04/17/2022    Anesthesia Physical Anesthesia Plan  ASA: 3  Anesthesia Plan: Spinal and MAC   Post-op Pain Management: Regional block* and Ofirmev IV (intra-op)*   Induction:   PONV Risk Score and Plan: 1 and Propofol infusion, Ondansetron, Dexamethasone and Treatment may vary due to age or medical condition  Airway Management Planned: Natural Airway and Simple Face Mask  Additional Equipment: None  Intra-op Plan:   Post-operative Plan:   Informed Consent: I have reviewed the patients History and Physical, chart, labs and discussed the procedure including the risks, benefits and alternatives for the proposed anesthesia with the patient or authorized representative  who has indicated his/her understanding and acceptance.       Plan Discussed with: CRNA  Anesthesia Plan Comments: (See PAT note 04/17/2022)        Anesthesia Quick Evaluation

## 2022-04-18 NOTE — Progress Notes (Signed)
Anesthesia Chart Review   Case: [id number] Date/Time: 04/24/22 0700   Procedure: TOTAL KNEE ARTHROPLASTY (Right: Knee)   Anesthesia type: Choice   Pre-op diagnosis: right knee osteoarthritis   Location: [place] [facility] / [facility]   Surgeons: Gaynelle Arabian, MD       DISCUSSION:66 y.o. former smoker with h/o OSA on bipap, HTN, DM II, pacemaker in place (device orders in 04/17/2022 progress note), chronic diastolic heart failure, right knee OA scheduled for above procedure 04/24/2022 with Dr. Gaynelle Arabian.   Pt seen by cardiology 02/22/2022 for preoperative evaluation.  Per OV note, "According to the Revised Cardiac Risk Index (RCRI), his Perioperative Risk of Major Cardiac Event is (%): 0.9. His Functional Capacity in METs is: 5.72 according to the Duke Activity Status Index (DASI). Therefore, based on ACC/AHA guidelines, patient would be at acceptable risk for the planned procedure without further cardiovascular testing.    The patient was advised that if he develops new symptoms prior to surgery to contact our office to arrange for a follow-up visit, and he verbalized understanding."  Anticipate pt can proceed with planned procedure barring acute status change.   VS: Pulse (!) 59   Temp 36.6 C (Oral)   Resp 16   Ht 6' (1.829 m)   Wt (!) 137.4 kg   SpO2 98%   BMI 41.09 kg/m   PROVIDERS: Tonia Ghent, MD is PCP    LABS: Labs reviewed: Acceptable for surgery. (all labs ordered are listed, but only abnormal results are displayed)  Labs Reviewed  HEMOGLOBIN A1C - Abnormal; Notable for the following components:      Result Value   Hgb A1c MFr Bld 6.2 (*)    All other components within normal limits  BASIC METABOLIC PANEL - Abnormal; Notable for the following components:   Glucose, Bld 119 (*)    All other components within normal limits  GLUCOSE, CAPILLARY - Abnormal; Notable for the following components:   Glucose-Capillary 117 (*)    All other components within normal limits   SURGICAL PCR SCREEN  CBC     IMAGES:   EKG:   CV: Myocardial Perfusion 11/13/2019 The left ventricular ejection fraction is normal (55-65%). Nuclear stress EF: 63%. There was no ST segment deviation noted during stress. The study is normal. This is a low risk study.   Echo 03/13/2017 Study Conclusions   - Left ventricle: The cavity size was normal. Wall thickness was    increased in a pattern of moderate LVH. Systolic function was    normal. The estimated ejection fraction was in the range of 60%    to 65%. Features are consistent with a pseudonormal left    ventricular filling pattern, with concomitant abnormal relaxation    and increased filling pressure (grade 2 diastolic dysfunction).  - Right atrium: The atrium was mildly dilated.   Impressions:   - Extremely technically difficult echo .  Past Medical History:  Diagnosis Date   Achalasia    with prev eval at [facility].  No intervention as of 2012   Arthritis    Aspiration pneumonia ([place])    Backache, unspecified    Cardiac pacemaker St. Jude    ERI 2008   Depressive disorder, not elsewhere classified    Diabetes ([place])    Hypertension    Morbid obesity ([place])    Obstructive sleep apnea    biapap settign 25-22   Presence of permanent cardiac pacemaker    St. Jude- Dr. Caryl Comes follows -device  Check 07-06-14   Sinus bradycardia /pauses    Stricture and stenosis of esophagus     Past Surgical History:  Procedure Laterality Date   BALLOON DILATION N/A 10/06/2015   Procedure: BALLOON DILATION;  Surgeon: Mauri Pole, MD;  Location: [place] [facility];  Service: Endoscopy;  Laterality: N/A;   BIOPSY  05/06/2018   Procedure: BIOPSY;  Surgeon: Rush Landmark Telford Nab., MD;  Location: [place];  Service: Gastroenterology;;   COLONOSCOPY W/ POLYPECTOMY  11/01/2010   COLONOSCOPY WITH PROPOFOL N/A 10/05/2014   Procedure: COLONOSCOPY WITH PROPOFOL;  Surgeon: Gatha Mayer, MD;  [facility];  Service: Endoscopy;   Laterality: N/A;   COLONOSCOPY WITH PROPOFOL N/A 05/06/2018   Procedure: COLONOSCOPY WITH PROPOFOL;  Surgeon: Rush Landmark Telford Nab., MD;  Location: [place];  Service: Gastroenterology;  Laterality: N/A;   ESOPHAGEAL DILATION     ESOPHAGEAL MANOMETRY N/A 08/23/2015   Procedure: ESOPHAGEAL MANOMETRY (EM);  Surgeon: Mauri Pole, MD;  [facility];  Service: Endoscopy;  Laterality: N/A;   ESOPHAGOGASTRODUODENOSCOPY N/A 10/09/2014   Procedure: ESOPHAGOGASTRODUODENOSCOPY (EGD);  Surgeon: Gatha Mayer, MD;  [facility];  Service: Endoscopy;  Laterality: N/A;   ESOPHAGOGASTRODUODENOSCOPY (EGD) WITH PROPOFOL N/A 08/23/2015   Procedure: ESOPHAGOGASTRODUODENOSCOPY (EGD) WITH PROPOFOL;  Surgeon: Mauri Pole, MD;  [facility];  Service: Endoscopy;  Laterality: N/A;   ESOPHAGOGASTRODUODENOSCOPY (EGD) WITH PROPOFOL N/A 10/06/2015   Procedure: ESOPHAGOGASTRODUODENOSCOPY (EGD) WITH PROPOFOL;  Surgeon: Mauri Pole, MD;  Location: [place] [facility];  Service: Endoscopy;  Laterality: N/A;  Rigiflex ballon size 43m-35mm size 45 minute proc,need Fluro Gastografin esophagram 2 hrs post EGD    GASTRIC ROUX-EN-Y N/A 04/05/2020   Procedure: LAPAROSCOPIC ROUX-EN-Y GASTRIC BYPASS WITH UPPER ENDOSCOPY;  Surgeon: WGreer Pickerel MD;  [facility];  Service: General;  Laterality: N/A;   PACEMAKER INSERTION     POLYPECTOMY  05/06/2018   Procedure: POLYPECTOMY;  Surgeon: MIrving Copas, MD;  [place]  Service: Gastroenterology;;   PBetsy PriesGENERATOR CHANGEOUT N/A 05/01/2018   Procedure: PPM GENERATOR CHANGEOUT;  Surgeon: KDeboraha Sprang MD;  [facility];  Service: Cardiovascular;  Laterality: N/A;   TOTAL KNEE ARTHROPLASTY Left 08/15/2021   Procedure: TOTAL KNEE ARTHROPLASTY;  Surgeon: AGaynelle Arabian MD;  [facility];  Service: Orthopedics;  Laterality: Left;    MEDICATIONS:  acetaminophen (TYLENOL) 500 MG tablet   Aromatic  Inhalants (VICKS VAPOR INHALER IN)   Blood Glucose Monitoring Suppl (ONETOUCH VERIO) w/Device KIT   CALCIUM CITRATE PO   Lancets (ONETOUCH ULTRASOFT) lancets   Multiple Vitamin (MULTIVITAMIN) tablet   ONETOUCH VERIO test strip   sildenafil (VIAGRA) 50 MG tablet   No current facility-administered medications for this encounter.     JKonrad FelixWard, PA-C [facility] Pre-Surgical Testing [phone]

## 2022-04-24 ENCOUNTER — Encounter (HOSPITAL_COMMUNITY): Payer: Self-pay | Admitting: Orthopedic Surgery

## 2022-04-24 ENCOUNTER — Ambulatory Visit (HOSPITAL_COMMUNITY): Payer: BC Managed Care – PPO | Admitting: Physician Assistant

## 2022-04-24 ENCOUNTER — Observation Stay (HOSPITAL_COMMUNITY)
Admission: RE | Admit: 2022-04-24 | Discharge: 2022-04-25 | Disposition: A | Discharge status: Home / Self Care | Payer: BC Managed Care – PPO | Attending: Orthopedic Surgery | Admitting: Orthopedic Surgery

## 2022-04-24 ENCOUNTER — Other Ambulatory Visit: Payer: Self-pay

## 2022-04-24 ENCOUNTER — Ambulatory Visit (HOSPITAL_COMMUNITY): Payer: BC Managed Care – PPO | Admitting: Anesthesiology

## 2022-04-24 ENCOUNTER — Encounter (HOSPITAL_COMMUNITY)
Admission: RE | Disposition: A | Discharge status: Home / Self Care | Payer: Self-pay | Source: Home / Self Care | Attending: Orthopedic Surgery

## 2022-04-24 DIAGNOSIS — R001 Bradycardia, unspecified: Secondary | ICD-10-CM

## 2022-04-24 DIAGNOSIS — Z950 Presence of cardiac pacemaker: Secondary | ICD-10-CM | POA: Insufficient documentation

## 2022-04-24 DIAGNOSIS — Z8616 Personal history of COVID-19: Secondary | ICD-10-CM | POA: Insufficient documentation

## 2022-04-24 DIAGNOSIS — Z87891 Personal history of nicotine dependence: Secondary | ICD-10-CM | POA: Diagnosis not present

## 2022-04-24 DIAGNOSIS — M1711 Unilateral primary osteoarthritis, right knee: Secondary | ICD-10-CM | POA: Diagnosis not present

## 2022-04-24 DIAGNOSIS — E119 Type 2 diabetes mellitus without complications: Secondary | ICD-10-CM | POA: Diagnosis not present

## 2022-04-24 DIAGNOSIS — Z96652 Presence of left artificial knee joint: Secondary | ICD-10-CM | POA: Diagnosis not present

## 2022-04-24 DIAGNOSIS — E1140 Type 2 diabetes mellitus with diabetic neuropathy, unspecified: Secondary | ICD-10-CM | POA: Diagnosis not present

## 2022-04-24 DIAGNOSIS — M1710 Unilateral primary osteoarthritis, unspecified knee: Secondary | ICD-10-CM | POA: Diagnosis present

## 2022-04-24 DIAGNOSIS — Z79899 Other long term (current) drug therapy: Secondary | ICD-10-CM | POA: Insufficient documentation

## 2022-04-24 DIAGNOSIS — I10 Essential (primary) hypertension: Secondary | ICD-10-CM | POA: Insufficient documentation

## 2022-04-24 HISTORY — PX: TOTAL KNEE ARTHROPLASTY: SHX125

## 2022-04-24 LAB — GLUCOSE, CAPILLARY
Glucose-Capillary: 122 mg/dL — ABNORMAL HIGH (ref 70–99)
Glucose-Capillary: 148 mg/dL — ABNORMAL HIGH (ref 70–99)
Glucose-Capillary: 172 mg/dL — ABNORMAL HIGH (ref 70–99)
Glucose-Capillary: 175 mg/dL — ABNORMAL HIGH (ref 70–99)
Glucose-Capillary: 247 mg/dL — ABNORMAL HIGH (ref 70–99)

## 2022-04-24 SURGERY — ARTHROPLASTY, KNEE, TOTAL
Anesthesia: Monitor Anesthesia Care | Site: Knee | Laterality: Right

## 2022-04-24 MED ORDER — PROPOFOL 500 MG/50ML IV EMUL
INTRAVENOUS | Status: AC
  Filled 2022-04-24: qty 50

## 2022-04-24 MED ORDER — GABAPENTIN 300 MG PO CAPS
300.0000 mg | ORAL_CAPSULE | Freq: Three times a day (TID) | ORAL | Status: DC
  Administered 2022-04-24 – 2022-04-25 (×4): 300 mg via ORAL
  Filled 2022-04-24 (×4): qty 1

## 2022-04-24 MED ORDER — PHENYLEPHRINE 80 MCG/ML (10ML) SYRINGE FOR IV PUSH (FOR BLOOD PRESSURE SUPPORT)
PREFILLED_SYRINGE | INTRAVENOUS | Status: DC | PRN
  Administered 2022-04-24 (×5): 80 ug via INTRAVENOUS

## 2022-04-24 MED ORDER — POVIDONE-IODINE 10 % EX SWAB
2.0000 | Freq: Once | CUTANEOUS | Status: AC
  Administered 2022-04-24: 2 via TOPICAL

## 2022-04-24 MED ORDER — AMISULPRIDE (ANTIEMETIC) 5 MG/2ML IV SOLN: 10.0000 mg | Freq: Once | INTRAVENOUS | Status: DC | PRN

## 2022-04-24 MED ORDER — FENTANYL CITRATE (PF) 100 MCG/2ML IJ SOLN
INTRAMUSCULAR | Status: AC
  Filled 2022-04-24: qty 2

## 2022-04-24 MED ORDER — ONDANSETRON HCL 4 MG/2ML IJ SOLN
INTRAMUSCULAR | Status: DC | PRN
  Administered 2022-04-24: 4 mg via INTRAVENOUS

## 2022-04-24 MED ORDER — DIPHENHYDRAMINE HCL 12.5 MG/5ML PO ELIX: 12.5000 mg | ORAL_SOLUTION | ORAL | Status: DC | PRN

## 2022-04-24 MED ORDER — ROPIVACAINE HCL 5 MG/ML IJ SOLN
INTRAMUSCULAR | Status: DC | PRN
  Administered 2022-04-24: 20 mL via PERINEURAL

## 2022-04-24 MED ORDER — ONDANSETRON HCL 4 MG/2ML IJ SOLN
INTRAMUSCULAR | Status: AC
  Filled 2022-04-24: qty 2

## 2022-04-24 MED ORDER — ACETAMINOPHEN 500 MG PO TABS
1000.0000 mg | ORAL_TABLET | Freq: Four times a day (QID) | ORAL | Status: AC
  Administered 2022-04-24 – 2022-04-25 (×3): 1000 mg via ORAL
  Filled 2022-04-24 (×3): qty 2

## 2022-04-24 MED ORDER — INSULIN ASPART 100 UNIT/ML IJ SOLN
0.0000 [IU] | Freq: Every day | INTRAMUSCULAR | Status: DC
  Administered 2022-04-24: 2 [IU] via SUBCUTANEOUS

## 2022-04-24 MED ORDER — SODIUM CHLORIDE (PF) 0.9 % IJ SOLN
INTRAMUSCULAR | Status: AC
  Filled 2022-04-24: qty 10

## 2022-04-24 MED ORDER — PHENOL 1.4 % MT LIQD: 1.0000 | OROMUCOSAL | Status: DC | PRN

## 2022-04-24 MED ORDER — ONDANSETRON HCL 4 MG PO TABS: 4.0000 mg | ORAL_TABLET | Freq: Four times a day (QID) | ORAL | Status: DC | PRN

## 2022-04-24 MED ORDER — INSULIN ASPART 100 UNIT/ML IJ SOLN
0.0000 [IU] | Freq: Three times a day (TID) | INTRAMUSCULAR | Status: DC
  Administered 2022-04-24 – 2022-04-25 (×4): 3 [IU] via SUBCUTANEOUS

## 2022-04-24 MED ORDER — METOCLOPRAMIDE HCL 5 MG/ML IJ SOLN: 5.0000 mg | Freq: Three times a day (TID) | INTRAMUSCULAR | Status: DC | PRN

## 2022-04-24 MED ORDER — DEXMEDETOMIDINE HCL IN NACL 80 MCG/20ML IV SOLN
INTRAVENOUS | Status: AC
  Filled 2022-04-24: qty 20

## 2022-04-24 MED ORDER — TRAMADOL HCL 50 MG PO TABS
50.0000 mg | ORAL_TABLET | Freq: Four times a day (QID) | ORAL | Status: DC | PRN
  Administered 2022-04-24: 100 mg via ORAL
  Filled 2022-04-24: qty 2

## 2022-04-24 MED ORDER — CHLORHEXIDINE GLUCONATE 0.12 % MT SOLN
15.0000 mL | Freq: Once | OROMUCOSAL | Status: AC
  Administered 2022-04-24: 15 mL via OROMUCOSAL

## 2022-04-24 MED ORDER — BUPIVACAINE LIPOSOME 1.3 % IJ SUSP
INTRAMUSCULAR | Status: DC | PRN
  Administered 2022-04-24: 20 mL

## 2022-04-24 MED ORDER — ORAL CARE MOUTH RINSE: 15.0000 mL | OROMUCOSAL | Status: DC | PRN

## 2022-04-24 MED ORDER — PROPOFOL 1000 MG/100ML IV EMUL
INTRAVENOUS | Status: AC
  Filled 2022-04-24: qty 100

## 2022-04-24 MED ORDER — FENTANYL CITRATE PF 50 MCG/ML IJ SOSY: 25.0000 ug | PREFILLED_SYRINGE | INTRAMUSCULAR | Status: DC | PRN

## 2022-04-24 MED ORDER — ACETAMINOPHEN 10 MG/ML IV SOLN
1000.0000 mg | Freq: Four times a day (QID) | INTRAVENOUS | Status: DC
  Administered 2022-04-24: 1000 mg via INTRAVENOUS
  Filled 2022-04-24: qty 100

## 2022-04-24 MED ORDER — POLYETHYLENE GLYCOL 3350 17 G PO PACK: 17.0000 g | PACK | Freq: Every day | ORAL | Status: DC | PRN

## 2022-04-24 MED ORDER — LACTATED RINGERS IV SOLN: INTRAVENOUS | Status: DC

## 2022-04-24 MED ORDER — METOCLOPRAMIDE HCL 5 MG PO TABS: 5.0000 mg | ORAL_TABLET | Freq: Three times a day (TID) | ORAL | Status: DC | PRN

## 2022-04-24 MED ORDER — SODIUM CHLORIDE 0.9 % IV SOLN: INTRAVENOUS | Status: DC

## 2022-04-24 MED ORDER — FLEET ENEMA 7-19 GM/118ML RE ENEM: 1.0000 | ENEMA | Freq: Once | RECTAL | Status: DC | PRN

## 2022-04-24 MED ORDER — SODIUM CHLORIDE (PF) 0.9 % IJ SOLN
INTRAMUSCULAR | Status: AC
  Filled 2022-04-24: qty 50

## 2022-04-24 MED ORDER — BISACODYL 10 MG RE SUPP: 10.0000 mg | Freq: Every day | RECTAL | Status: DC | PRN

## 2022-04-24 MED ORDER — FENTANYL CITRATE (PF) 100 MCG/2ML IJ SOLN
INTRAMUSCULAR | Status: DC | PRN
  Administered 2022-04-24: 50 ug via INTRAVENOUS

## 2022-04-24 MED ORDER — PROPOFOL 500 MG/50ML IV EMUL
INTRAVENOUS | Status: DC | PRN
  Administered 2022-04-24: 100 ug/kg/min via INTRAVENOUS

## 2022-04-24 MED ORDER — SODIUM CHLORIDE (PF) 0.9 % IJ SOLN
INTRAMUSCULAR | Status: DC | PRN
  Administered 2022-04-24: 60 mL via INTRAVENOUS

## 2022-04-24 MED ORDER — DEXAMETHASONE SODIUM PHOSPHATE 10 MG/ML IJ SOLN
8.0000 mg | Freq: Once | INTRAMUSCULAR | Status: AC
  Administered 2022-04-24: 4 mg via INTRAVENOUS

## 2022-04-24 MED ORDER — CEFAZOLIN SODIUM-DEXTROSE 2-4 GM/100ML-% IV SOLN
2.0000 g | Freq: Four times a day (QID) | INTRAVENOUS | Status: AC
  Administered 2022-04-24 (×2): 2 g via INTRAVENOUS
  Filled 2022-04-24 (×2): qty 100

## 2022-04-24 MED ORDER — CEFAZOLIN SODIUM-DEXTROSE 2-4 GM/100ML-% IV SOLN
2.0000 g | INTRAVENOUS | Status: AC
  Administered 2022-04-24: 2 g via INTRAVENOUS
  Filled 2022-04-24: qty 100

## 2022-04-24 MED ORDER — BUPIVACAINE IN DEXTROSE 0.75-8.25 % IT SOLN
INTRATHECAL | Status: DC | PRN
  Administered 2022-04-24: 1.6 mL via INTRATHECAL

## 2022-04-24 MED ORDER — BUPIVACAINE LIPOSOME 1.3 % IJ SUSP
INTRAMUSCULAR | Status: AC
  Filled 2022-04-24: qty 20

## 2022-04-24 MED ORDER — PHENYLEPHRINE 80 MCG/ML (10ML) SYRINGE FOR IV PUSH (FOR BLOOD PRESSURE SUPPORT)
PREFILLED_SYRINGE | INTRAVENOUS | Status: AC
  Filled 2022-04-24: qty 10

## 2022-04-24 MED ORDER — TRANEXAMIC ACID-NACL 1000-0.7 MG/100ML-% IV SOLN
1000.0000 mg | INTRAVENOUS | Status: AC
  Administered 2022-04-24: 1000 mg via INTRAVENOUS
  Filled 2022-04-24: qty 100

## 2022-04-24 MED ORDER — METHOCARBAMOL 1000 MG/10ML IJ SOLN: 500.0000 mg | Freq: Four times a day (QID) | INTRAVENOUS | Status: DC | PRN

## 2022-04-24 MED ORDER — DOCUSATE SODIUM 100 MG PO CAPS
100.0000 mg | ORAL_CAPSULE | Freq: Two times a day (BID) | ORAL | Status: DC
  Administered 2022-04-24 – 2022-04-25 (×2): 100 mg via ORAL
  Filled 2022-04-24 (×2): qty 1

## 2022-04-24 MED ORDER — OXYCODONE HCL 5 MG PO TABS
5.0000 mg | ORAL_TABLET | ORAL | Status: DC | PRN
  Administered 2022-04-24 – 2022-04-25 (×3): 10 mg via ORAL
  Filled 2022-04-24 (×3): qty 2

## 2022-04-24 MED ORDER — SODIUM CHLORIDE 0.9 % IR SOLN
Status: DC | PRN
  Administered 2022-04-24 (×2): 1000 mL

## 2022-04-24 MED ORDER — DEXMEDETOMIDINE HCL IN NACL 80 MCG/20ML IV SOLN
INTRAVENOUS | Status: DC | PRN
  Administered 2022-04-24: 8 ug via BUCCAL

## 2022-04-24 MED ORDER — HYDROMORPHONE HCL 1 MG/ML IJ SOLN: 0.5000 mg | INTRAMUSCULAR | Status: DC | PRN

## 2022-04-24 MED ORDER — RIVAROXABAN 10 MG PO TABS
10.0000 mg | ORAL_TABLET | Freq: Every day | ORAL | Status: DC
  Administered 2022-04-25: 10 mg via ORAL
  Filled 2022-04-24: qty 1

## 2022-04-24 MED ORDER — BUPIVACAINE LIPOSOME 1.3 % IJ SUSP: 20.0000 mL | Freq: Once | INTRAMUSCULAR | Status: DC

## 2022-04-24 MED ORDER — DEXAMETHASONE SODIUM PHOSPHATE 10 MG/ML IJ SOLN
INTRAMUSCULAR | Status: AC
  Filled 2022-04-24: qty 1

## 2022-04-24 MED ORDER — MIDAZOLAM HCL 2 MG/2ML IJ SOLN
INTRAMUSCULAR | Status: AC
  Filled 2022-04-24: qty 2

## 2022-04-24 MED ORDER — MIDAZOLAM HCL 5 MG/5ML IJ SOLN
INTRAMUSCULAR | Status: DC | PRN
  Administered 2022-04-24: 1 mg via INTRAVENOUS

## 2022-04-24 MED ORDER — ONDANSETRON HCL 4 MG/2ML IJ SOLN: 4.0000 mg | Freq: Four times a day (QID) | INTRAMUSCULAR | Status: DC | PRN

## 2022-04-24 MED ORDER — METHOCARBAMOL 500 MG PO TABS
500.0000 mg | ORAL_TABLET | Freq: Four times a day (QID) | ORAL | Status: DC | PRN
  Administered 2022-04-24 – 2022-04-25 (×2): 500 mg via ORAL
  Filled 2022-04-24 (×2): qty 1

## 2022-04-24 MED ORDER — ORAL CARE MOUTH RINSE: 15.0000 mL | Freq: Once | OROMUCOSAL | Status: AC

## 2022-04-24 MED ORDER — PROPOFOL 10 MG/ML IV BOLUS
INTRAVENOUS | Status: DC | PRN
  Administered 2022-04-24 (×3): 20 mg via INTRAVENOUS
  Administered 2022-04-24: 30 mg via INTRAVENOUS

## 2022-04-24 MED ORDER — CLONIDINE HCL (ANALGESIA) 100 MCG/ML EP SOLN
EPIDURAL | Status: DC | PRN
  Administered 2022-04-24: 50 ug

## 2022-04-24 MED ORDER — MENTHOL 3 MG MT LOZG: 1.0000 | LOZENGE | OROMUCOSAL | Status: DC | PRN

## 2022-04-24 SURGICAL SUPPLY — 53 items
ATTUNE MED DOME PAT 41 KNEE (Knees) IMPLANT
ATTUNE PS FEM RT SZ 7 CEM KNEE (Femur) IMPLANT
ATTUNE PSRP INSR SZ7 12 KNEE (Insert) IMPLANT
BAG COUNTER SPONGE SURGICOUNT (BAG) IMPLANT
BAG ZIPLOCK 12X15 (MISCELLANEOUS) ×1 IMPLANT
BASE TIBIAL ROT PLAT SZ 7 KNEE (Knees) IMPLANT
BLADE SAG 18X100X1.27 (BLADE) ×1 IMPLANT
BLADE SAW SGTL 11.0X1.19X90.0M (BLADE) ×1 IMPLANT
BNDG ELASTIC 6X5.8 VLCR STR LF (GAUZE/BANDAGES/DRESSINGS) ×1 IMPLANT
BOWL SMART MIX CTS (DISPOSABLE) ×1 IMPLANT
CEMENT HV SMART SET (Cement) ×2 IMPLANT
COVER SURGICAL LIGHT HANDLE (MISCELLANEOUS) ×1 IMPLANT
CUFF TOURN SGL QUICK 34 (TOURNIQUET CUFF) ×1
CUFF TRNQT CYL 34X4.125X (TOURNIQUET CUFF) ×1 IMPLANT
DRAPE INCISE IOBAN 66X45 STRL (DRAPES) ×1 IMPLANT
DRAPE U-SHAPE 47X51 STRL (DRAPES) ×1 IMPLANT
DRSG AQUACEL AG ADV 3.5X10 (GAUZE/BANDAGES/DRESSINGS) ×1 IMPLANT
DURAPREP 26ML APPLICATOR (WOUND CARE) ×1 IMPLANT
ELECT REM PT RETURN 15FT ADLT (MISCELLANEOUS) ×1 IMPLANT
GLOVE BIO SURGEON STRL SZ 6.5 (GLOVE) IMPLANT
GLOVE BIO SURGEON STRL SZ7.5 (GLOVE) IMPLANT
GLOVE BIO SURGEON STRL SZ8 (GLOVE) ×1 IMPLANT
GLOVE BIOGEL PI IND STRL 6.5 (GLOVE) IMPLANT
GLOVE BIOGEL PI IND STRL 7.0 (GLOVE) IMPLANT
GLOVE BIOGEL PI IND STRL 8 (GLOVE) ×1 IMPLANT
GOWN STRL REUS W/ TWL LRG LVL3 (GOWN DISPOSABLE) ×1 IMPLANT
GOWN STRL REUS W/ TWL XL LVL3 (GOWN DISPOSABLE) IMPLANT
GOWN STRL REUS W/TWL LRG LVL3 (GOWN DISPOSABLE) ×1
GOWN STRL REUS W/TWL XL LVL3 (GOWN DISPOSABLE)
HANDPIECE INTERPULSE COAX TIP (DISPOSABLE) ×1
HOLDER FOLEY CATH W/STRAP (MISCELLANEOUS) IMPLANT
IMMOBILIZER KNEE 20 (SOFTGOODS) ×1
IMMOBILIZER KNEE 20 THIGH 36 (SOFTGOODS) ×1 IMPLANT
KIT TURNOVER KIT A (KITS) IMPLANT
MANIFOLD NEPTUNE II (INSTRUMENTS) ×1 IMPLANT
NS IRRIG 1000ML POUR BTL (IV SOLUTION) ×1 IMPLANT
PACK TOTAL KNEE CUSTOM (KITS) ×1 IMPLANT
PADDING CAST COTTON 6X4 STRL (CAST SUPPLIES) ×2 IMPLANT
PIN STEINMAN FIXATION KNEE (PIN) IMPLANT
PROTECTOR NERVE ULNAR (MISCELLANEOUS) ×1 IMPLANT
SET HNDPC FAN SPRY TIP SCT (DISPOSABLE) ×1 IMPLANT
SPIKE FLUID TRANSFER (MISCELLANEOUS) ×1 IMPLANT
STRIP CLOSURE SKIN 1/2X4 (GAUZE/BANDAGES/DRESSINGS) ×2 IMPLANT
SUT MNCRL AB 4-0 PS2 18 (SUTURE) ×1 IMPLANT
SUT STRATAFIX 0 PDS 27 VIOLET (SUTURE) ×1
SUT VIC AB 2-0 CT1 27 (SUTURE) ×3
SUT VIC AB 2-0 CT1 TAPERPNT 27 (SUTURE) ×3 IMPLANT
SUTURE STRATFX 0 PDS 27 VIOLET (SUTURE) ×1 IMPLANT
TIBIAL BASE ROT PLAT SZ 7 KNEE (Knees) ×1 IMPLANT
TRAY FOLEY MTR SLVR 16FR STAT (SET/KITS/TRAYS/PACK) ×1 IMPLANT
TUBE SUCTION HIGH CAP CLEAR NV (SUCTIONS) ×1 IMPLANT
WATER STERILE IRR 1000ML POUR (IV SOLUTION) ×2 IMPLANT
WRAP KNEE MAXI GEL POST OP (GAUZE/BANDAGES/DRESSINGS) ×1 IMPLANT

## 2022-04-24 NOTE — Evaluation (Signed)
Physical Therapy Evaluation Patient Details Name: Glenn Lyons MRN: [id number] DOB: 1955-10-08 Today's Date: 04/24/2022  History of Present Illness  Pt is a 66yo male presenting s/p R-TKA on 04/24/22. 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   Clinical Impression  Glenn Lyons is a 66 y.o. male POD 0 s/p R-TKA. Patient reports independence with mobility at baseline. Patient is now limited by functional impairments (see PT problem list below) and requires min assist for bed mobility and for transfers. Patient was able to ambulate 24 feet with RW and min guard level of assist. Patient instructed in exercise to facilitate ROM and circulation to manage edema. Provided incentive spirometer and with Vcs pt able to achieve 2548m. Pt complaining of pain in R proximal medial thigh, per visual inspection has an area of skin redness and irritation, RN notified. Patient will benefit from continued skilled PT interventions to address impairments and progress towards PLOF. Acute PT will follow to progress mobility and stair training in preparation for safe discharge home.       Recommendations for follow up therapy are one component of a multi-disciplinary discharge planning process, led by the attending physician.  Recommendations may be updated based on patient status, additional functional criteria and insurance authorization.  Follow Up Recommendations Follow physician's recommendations for discharge plan and follow up therapies      Assistance Recommended at Discharge Frequent or constant Supervision/Assistance  Patient can return home with the following  A little help with walking and/or transfers;A little help with bathing/dressing/bathroom;Assistance with feeding;Assist for transportation;Help with stairs or ramp for entrance    Equipment Recommendations None recommended by PT (Pt has recommended DME)  Recommendations  for Other Services       Functional Status Assessment Patient has had a recent decline in their functional status and demonstrates the ability to make significant improvements in function in a reasonable and predictable amount of time.     Precautions / Restrictions Precautions Precautions: Fall;Knee Precaution Booklet Issued: No Precaution Comments: no pillow under the knee Restrictions Weight Bearing Restrictions: No Other Position/Activity Restrictions: wbat      Mobility  Bed Mobility Overal bed mobility: Needs Assistance Bed Mobility: Supine to Sit     Supine to sit: Min assist, HOB elevated     General bed mobility comments: Min assist for trunk elevation only    Transfers Overall transfer level: Needs assistance Equipment used: Rolling walker (2 wheels) Transfers: Sit to/from Stand Sit to Stand: Min assist, From elevated surface           General transfer comment: Min assist for steadying of RW as pt pulled up on RW despite cues to push off bed.    Ambulation/Gait Ambulation/Gait assistance: Min guard Gait Distance (Feet): 24 Feet Assistive device: Rolling walker (2 wheels) Gait Pattern/deviations: Step-to pattern Gait velocity: decreased     General Gait Details: Pt ambulated with RW and min guard, no physical assist required or overt LOB noted.  Stairs            Wheelchair Mobility    Modified Rankin (Stroke Patients Only)       Balance Overall balance assessment: Needs assistance Sitting-balance support: Feet supported, No upper extremity supported Sitting balance-Leahy Scale: Good     Standing balance support: Reliant on assistive device for balance, During functional activity, Bilateral upper extremity supported Standing balance-Leahy Scale: Poor  Pertinent Vitals/Pain Pain Assessment Pain Assessment: 0-10 Pain Score: 7  Pain Location: Right groin/proximal thigh Pain Descriptors /  Indicators: Operative site guarding, Discomfort, Burning, Tender Pain Intervention(s): Limited activity within patient's tolerance, Monitored during session, Repositioned, Ice applied, Patient requesting pain meds-RN notified, Other (comment) (Examined R groin/thigh and there was a small, nickle-sized area of redness, suspect CPM was irritating/chafing the pt's LE, RN notified)    Home Living Family/patient expects to be discharged to:: Private residence Living Arrangements: Spouse/significant other Available Help at Discharge: Family;Available 24 hours/day Type of Home: House Home Access: Stairs to enter Entrance Stairs-Rails: None Entrance Stairs-Number of Steps: 1   Home Layout: One level Home Equipment: Conservation officer, nature (2 wheels);Rollator (4 wheels);Cane - single point;BSC/3in1;Shower seat (upright/vertical rollator (hasn't used in a long time))      Prior Function Prior Level of Function : Independent/Modified Independent;Working/employed;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)             Mobility Comments: SPC as needed ADLs Comments: IND     Hand Dominance        Extremity/Trunk Assessment   Upper Extremity Assessment Upper Extremity Assessment: Overall WFL for tasks assessed    Lower Extremity Assessment Lower Extremity Assessment: RLE deficits/detail;LLE deficits/detail RLE Deficits / Details: MMT ank DF/PF 5/5, no extensor lag noted, SLR very small at ~1" RLE Sensation: WNL LLE Deficits / Details: MMT ank DF/PF 5/5 LLE Sensation: WNL    Cervical / Trunk Assessment Cervical / Trunk Assessment: Normal  Communication   Communication: No difficulties  Cognition Arousal/Alertness: Awake/alert Behavior During Therapy: WFL for tasks assessed/performed Overall Cognitive Status: Within Functional Limits for tasks assessed                                          General Comments      Exercises Total Joint Exercises Ankle Circles/Pumps:  AROM, Both, 10 reps   Assessment/Plan    PT Assessment Patient needs continued PT services  PT Problem List Decreased strength;Decreased range of motion;Decreased activity tolerance;Decreased balance;Decreased mobility;Decreased coordination;Pain       PT Treatment Interventions DME instruction;Gait training;Stair training;Functional mobility training;Therapeutic activities;Therapeutic exercise;Balance training;Neuromuscular re-education;Patient/family education    PT Goals (Current goals can be found in the Care Plan section)  Acute Rehab PT Goals Patient Stated Goal: Return to golfing PT Goal Formulation: With patient Time For Goal Achievement: 05/01/22 Potential to Achieve Goals: Good    Frequency 7X/week     Co-evaluation               AM-PAC PT "6 Clicks" Mobility  Outcome Measure Help needed turning from your back to your side while in a flat bed without using bedrails?: None Help needed moving from lying on your back to sitting on the side of a flat bed without using bedrails?: A Little Help needed moving to and from a bed to a chair (including a wheelchair)?: A Little Help needed standing up from a chair using your arms (e.g., wheelchair or bedside chair)?: A Little Help needed to walk in hospital room?: A Little Help needed climbing 3-5 steps with a railing? : A Little 6 Click Score: 19    End of Session Equipment Utilized During Treatment: Gait belt Activity Tolerance: Patient tolerated treatment well;No increased pain Patient left: in chair;with call bell/phone within reach;with chair alarm set;with SCD's reapplied Nurse Communication: Mobility status;Other (comment) (Skin irritation on  RLE proximal thigh) PT Visit Diagnosis: Pain;Difficulty in walking, not elsewhere classified (R26.2) Pain - Right/Left: Right Pain - part of body: Knee    Time: 1224-1254 PT Time Calculation (min) (ACUTE ONLY): 30 min   Charges:   PT Evaluation $PT Eval Low Complexity:  1 Low PT Treatments $Gait Training: 8-22 mins        Coolidge Breeze, PT, DPT [facility] Department Office: [phone] Weekend pager: [phone]  Coolidge Breeze 04/24/2022, 1:04 PM

## 2022-04-24 NOTE — Plan of Care (Signed)
Plan of care reviewed and discussed with the patient. 

## 2022-04-24 NOTE — Anesthesia Procedure Notes (Signed)
Procedure Name: MAC Date/Time: 04/24/2022 7:17 AM  Performed by: Deliah Boston, CRNAPre-anesthesia Checklist: Patient identified, Emergency Drugs available, Suction available and Patient being monitored Patient Re-evaluated:Patient Re-evaluated prior to induction Oxygen Delivery Method: Simple face mask Preoxygenation: Pre-oxygenation with 100% oxygen Placement Confirmation: positive ETCO2 and breath sounds checked- equal and bilateral

## 2022-04-24 NOTE — Discharge Instructions (Addendum)
Glenn Arabian, MD Total Joint Specialist [facility] [place] [address], [address] [place],  [address] [phone]  TOTAL KNEE REPLACEMENT POSTOPERATIVE DIRECTIONS    Knee Rehabilitation, Guidelines Following Surgery  Results after knee surgery are often greatly improved when you follow the exercise, range of motion and muscle strengthening exercises prescribed by your doctor. Safety measures are also important to protect the knee from further injury. If any of these exercises cause you to have increased pain or swelling in your knee joint, decrease the amount until you are comfortable again and slowly increase them. If you have problems or questions, call your caregiver or physical therapist for advice.   BLOOD CLOT PREVENTION Take a 10 mg Xarelto once a day for three weeks following surgery. Then discontinue. You may resume your vitamins/supplements once you have discontinued the Xarelto. Do not take any NSAIDs (Advil, Aleve, Ibuprofen, Meloxicam, etc.) until you have discontinued the Xarelto.   HOME CARE INSTRUCTIONS  Remove items at home which could result in a fall. This includes throw rugs or furniture in walking pathways.  ICE to the affected knee as much as tolerated. Icing helps control swelling. If the swelling is well controlled you will be more comfortable and rehab easier. Continue to use ice on the knee for pain and swelling from surgery. You may notice swelling that will progress down to the foot and ankle. This is normal after surgery. Elevate the leg when you are not up walking on it.    Continue to use the breathing machine which will help keep your temperature down. It is common for your temperature to cycle up and down following surgery, especially at night when you are not up moving around and exerting yourself. The breathing machine keeps your lungs expanded and your temperature down. Do not place pillow under the operative knee, focus on keeping  the knee straight while resting  DIET You may resume your previous home diet once you are discharged from the hospital.  DRESSING / WOUND CARE / SHOWERING Keep your bulky bandage on for 2 days. On the third post-operative day you may remove the Ace bandage and gauze. There is a waterproof adhesive bandage on your skin which will stay in place until your first follow-up appointment. Once you remove this you will not need to place another bandage You may begin showering 3 days following surgery, but do not submerge the incision under water.  ACTIVITY For the first 5 days, the key is rest and control of pain and swelling Do your home exercises twice a day starting on post-operative day 3. On the days you go to physical therapy, just do the home exercises once that day. You should rest, ice and elevate the leg for 50 minutes out of every hour. Get up and walk/stretch for 10 minutes per hour. After 5 days you can increase your activity slowly as tolerated. Walk with your walker as instructed. Use the walker until you are comfortable transitioning to a cane. Walk with the cane in the opposite hand of the operative leg. You may discontinue the cane once you are comfortable and walking steadily. Avoid periods of inactivity such as sitting longer than an hour when not asleep. This helps prevent blood clots.  You may discontinue the knee immobilizer once you are able to perform a straight leg raise while lying down. You may resume a sexual relationship in one month or when given the OK by your doctor.  You may return to work  once you are cleared by your doctor.  Do not drive a car for 6 weeks or until released by your surgeon.  Do not drive while taking narcotics.  TED HOSE STOCKINGS Wear the elastic stockings on both legs for three weeks following surgery during the day. You may remove them at night for sleeping.  WEIGHT BEARING Weight bearing as tolerated with assist device (walker, cane, etc) as  directed, use it as long as suggested by your surgeon or therapist, typically at least 4-6 weeks.  POSTOPERATIVE CONSTIPATION PROTOCOL Constipation - defined medically as fewer than three stools per week and severe constipation as less than one stool per week.  One of the most common issues patients have following surgery is constipation.  Even if you have a regular bowel pattern at home, your normal regimen is likely to be disrupted due to multiple reasons following surgery.  Combination of anesthesia, postoperative narcotics, change in appetite and fluid intake all can affect your bowels.  In order to avoid complications following surgery, here are some recommendations in order to help you during your recovery period.  Colace (docusate) - Pick up an over-the-counter form of Colace or another stool softener and take twice a day as long as you are requiring postoperative pain medications.  Take with a full glass of water daily.  If you experience loose stools or diarrhea, hold the colace until you stool forms back up. If your symptoms do not get better within 1 week or if they get worse, check with your doctor. Dulcolax (bisacodyl) - Pick up over-the-counter and take as directed by the product packaging as needed to assist with the movement of your bowels.  Take with a full glass of water.  Use this product as needed if not relieved by Colace only.  MiraLax (polyethylene glycol) - Pick up over-the-counter to have on hand. MiraLax is a solution that will increase the amount of water in your bowels to assist with bowel movements.  Take as directed and can mix with a glass of water, juice, soda, coffee, or tea. Take if you go more than two days without a movement. Do not use MiraLax more than once per day. Call your doctor if you are still constipated or irregular after using this medication for 7 days in a row.  If you continue to have problems with postoperative constipation, please contact the office for  further assistance and recommendations.  If you experience "the worst abdominal pain ever" or develop nausea or vomiting, please contact the office immediatly for further recommendations for treatment.  ITCHING If you experience itching with your medications, try taking only a single pain pill, or even half a pain pill at a time.  You can also use Benadryl over the counter for itching or also to help with sleep.   MEDICATIONS See your medication summary on the "After Visit Summary" that the nursing staff will review with you prior to discharge.  You may have some home medications which will be placed on hold until you complete the course of blood thinner medication.  It is important for you to complete the blood thinner medication as prescribed by your surgeon.  Continue your approved medications as instructed at time of discharge.  PRECAUTIONS If you experience chest pain or shortness of breath - call 911 immediately for transfer to the hospital emergency department.  If you develop a fever greater that 101 F, purulent drainage from wound, increased redness or drainage from wound, foul odor from the  wound/dressing, or calf pain - CONTACT YOUR SURGEON.                                                   FOLLOW-UP APPOINTMENTS Make sure you keep all of your appointments after your operation with your surgeon and caregivers. You should call the office at the above phone number and make an appointment for approximately two weeks after the date of your surgery or on the date instructed by your surgeon outlined in the "After Visit Summary".  RANGE OF MOTION AND STRENGTHENING EXERCISES  Rehabilitation of the knee is important following a knee injury or an operation. After just a few days of immobilization, the muscles of the thigh which control the knee become weakened and shrink (atrophy). Knee exercises are designed to build up the tone and strength of the thigh muscles and to improve knee motion. Often  times heat used for twenty to thirty minutes before working out will loosen up your tissues and help with improving the range of motion but do not use heat for the first two weeks following surgery. These exercises can be done on a training (exercise) mat, on the floor, on a table or on a bed. Use what ever works the best and is most comfortable for you Knee exercises include:  Leg Lifts - While your knee is still immobilized in a splint or cast, you can do straight leg raises. Lift the leg to 60 degrees, hold for 3 sec, and slowly lower the leg. Repeat 10-20 times 2-3 times daily. Perform this exercise against resistance later as your knee gets better.  Quad and Hamstring Sets - Tighten up the muscle on the front of the thigh (Quad) and hold for 5-10 sec. Repeat this 10-20 times hourly. Hamstring sets are done by pushing the foot backward against an object and holding for 5-10 sec. Repeat as with quad sets.  Leg Slides: Lying on your back, slowly slide your foot toward your buttocks, bending your knee up off the floor (only go as far as is comfortable). Then slowly slide your foot back down until your leg is flat on the floor again. Angel Wings: Lying on your back spread your legs to the side as far apart as you can without causing discomfort.  A rehabilitation program following serious knee injuries can speed recovery and prevent re-injury in the future due to weakened muscles. Contact your doctor or a physical therapist for more information on knee rehabilitation.   POST-OPERATIVE OPIOID TAPER INSTRUCTIONS: It is important to wean off of your opioid medication as soon as possible. If you do not need pain medication after your surgery it is ok to stop day one. Opioids include: Codeine, Hydrocodone(Norco, Vicodin), Oxycodone(Percocet, oxycontin) and hydromorphone amongst others.  Long term and even short term use of opiods can cause: Increased pain  response Dependence Constipation Depression Respiratory depression And more.  Withdrawal symptoms can include Flu like symptoms Nausea, vomiting And more Techniques to manage these symptoms Hydrate well Eat regular healthy meals Stay active Use relaxation techniques(deep breathing, meditating, yoga) Do Not substitute Alcohol to help with tapering If you have been on opioids for less than two weeks and do not have pain than it is ok to stop all together.  Plan to wean off of opioids This plan should start within one week post op  of your joint replacement. Maintain the same interval or time between taking each dose and first decrease the dose.  Cut the total daily intake of opioids by one tablet each day Next start to increase the time between doses. The last dose that should be eliminated is the evening dose.   IF YOU ARE TRANSFERRED TO A SKILLED REHAB FACILITY If the patient is transferred to a skilled rehab facility following release from the hospital, a list of the current medications will be sent to the facility for the patient to continue.  When discharged from the skilled rehab facility, please have the facility set up the patient's [place] prior to being released. Also, the skilled facility will be responsible for providing the patient with their medications at time of release from the facility to include their pain medication, the muscle relaxants, and their blood thinner medication. If the patient is still at the rehab facility at time of the two week follow up appointment, the skilled rehab facility will also need to assist the patient in arranging follow up appointment in our office and any transportation needs.  MAKE SURE YOU:  Understand these instructions.  Get help right away if you are not doing well or get worse.   DENTAL ANTIBIOTICS:  In most cases prophylactic antibiotics for Dental procdeures after total joint surgery are not  necessary.  Exceptions are as follows:  1. History of prior total joint infection  2. Severely immunocompromised (Organ Transplant, cancer chemotherapy, Rheumatoid biologic medications such as [place])  3. Poorly controlled diabetes (A1C &gt; 8.0, blood glucose over 200)  If you have one of these conditions, contact your surgeon for an antibiotic prescription, prior to your dental procedure.    Pick up stool softner and laxative for home use following surgery while on pain medications. Do not submerge incision under water. Please use good hand washing techniques while changing dressing each day. May shower starting three days after surgery. Please use a clean towel to pat the incision dry following showers. Continue to use ice for pain and swelling after surgery. Do not use any lotions or creams on the incision until instructed by your surgeon.  ___________________________________________________________  Information on my medicine - XARELTO (Rivaroxaban)  This medication education was reviewed with me or my healthcare representative as part of my discharge preparation.    Why was Xarelto prescribed for you? Xarelto was prescribed for you to reduce the risk of blood clots forming after orthopedic surgery. The medical term for these abnormal blood clots is venous thromboembolism (VTE).  What do you need to know about xarelto ? Take your Xarelto ONCE DAILY at the same time every day. You may take it either with or without food.  If you have difficulty swallowing the tablet whole, you may crush it and mix in applesauce just prior to taking your dose.  Take Xarelto exactly as prescribed by your doctor and DO NOT stop taking Xarelto without talking to the doctor who prescribed the medication.  Stopping without other VTE prevention medication to take the place of Xarelto may increase your risk of developing a clot.  After discharge, you should have regular check-up appointments  with your healthcare provider that is prescribing your Xarelto.    What do you do if you miss a dose? If you miss a dose, take it as soon as you remember on the same day then continue your regularly scheduled once daily regimen the next day. Do not take two doses  of Xarelto on the same day.   Important Safety Information A possible side effect of Xarelto is bleeding. You should call your healthcare provider right away if you experience any of the following: Bleeding from an injury or your nose that does not stop. Unusual colored urine (red or dark brown) or unusual colored stools (red or black). Unusual bruising for unknown reasons. A serious fall or if you hit your head (even if there is no bleeding).  Some medicines may interact with Xarelto and might increase your risk of bleeding while on Xarelto. To help avoid this, consult your healthcare provider or pharmacist prior to using any new prescription or non-prescription medications, including herbals, vitamins, non-steroidal anti-inflammatory drugs (NSAIDs) and supplements.  This website has more information on Xarelto: [email or web address]

## 2022-04-24 NOTE — Op Note (Signed)
OPERATIVE REPORT-TOTAL KNEE ARTHROPLASTY   Pre-operative diagnosis- Osteoarthritis  Right knee(s)  Post-operative diagnosis- Osteoarthritis Right knee(s)  Procedure-  Right  Total Knee Arthroplasty  Surgeon- Glenn Plover. Yacine Droz, MD  Assistant- Molli Barrows, PA-C   Anesthesia-   Adductor canal block and spinal  EBL-25 ml   Drains None  Tourniquet time-  Total Tourniquet Time Documented: Thigh (Right) - 40 minutes Total: Thigh (Right) - 40 minutes     Complications- None  Condition-PACU - hemodynamically stable.   Brief Clinical Note  Glenn Lyons is a 66 y.o. year old male with end stage OA of his right knee with progressively worsening pain and dysfunction. He has constant pain, with activity and at rest and significant functional deficits with difficulties even with ADLs. He has had extensive non-op management including analgesics, injections of cortisone and viscosupplements, and home exercise program, but remains in significant pain with significant dysfunction. Radiographs show bone on bone arthritis medial and patellofemoral. He presents now for right Total Knee Arthroplasty.     Procedure in detail---   The patient is brought into the operating room and positioned supine on the operating table. After successful administration of  Adductor canal block and spinal,   a tourniquet is placed high on the  Right thigh(s) and the lower extremity is prepped and draped in the usual sterile fashion. Time out is performed by the operating team and then the  Right lower extremity is wrapped in Esmarch, knee flexed and the tourniquet inflated to 300 mmHg.       A midline incision is made with a ten blade through the subcutaneous tissue to the level of the extensor mechanism. A fresh blade is used to make a medial parapatellar arthrotomy. Soft tissue over the proximal medial tibia is subperiosteally elevated to the joint line with a knife and into the semimembranosus bursa with a Cobb  elevator. Soft tissue over the proximal lateral tibia is elevated with attention being paid to avoiding the patellar tendon on the tibial tubercle. The patella is everted, knee flexed 90 degrees and the ACL and PCL are removed. Findings are bone on bone medial and patellofemoral with large global osteophytes.        The drill is used to create a starting hole in the distal femur and the canal is thoroughly irrigated with sterile saline to remove the fatty contents. The 5 degree Right  valgus alignment guide is placed into the femoral canal and the distal femoral cutting block is pinned to remove 10 mm off the distal femur. Resection is made with an oscillating saw.      The tibia is subluxed forward and the menisci are removed. The extramedullary alignment guide is placed referencing proximally at the medial aspect of the tibial tubercle and distally along the second metatarsal axis and tibial crest. The block is pinned to remove 63m off the more deficient medial  side. Resection is made with an oscillating saw. Size 7is the most appropriate size for the tibia and the proximal tibia is prepared with the modular drill and keel punch for that size.      The femoral sizing guide is placed and size 7 is most appropriate. Rotation is marked off the epicondylar axis and confirmed by creating a rectangular flexion gap at 90 degrees. The size 7 cutting block is pinned in this rotation and the anterior, posterior and chamfer cuts are made with the oscillating saw. The intercondylar block is then placed and that cut is  made.      Trial size 7 tibial component, trial size 7 posterior stabilized femur and a 12  mm posterior stabilized rotating platform insert trial is placed. Full extension is achieved with excellent varus/valgus and anterior/posterior balance throughout full range of motion. The patella is everted and thickness measured to be 27  mm. Free hand resection is taken to 15 mm, a 41 template is placed, lug holes  are drilled, trial patella is placed, and it tracks normally. Osteophytes are removed off the posterior femur with the trial in place. All trials are removed and the cut bone surfaces prepared with pulsatile lavage. Cement is mixed and once ready for implantation, the size 7 tibial implant, size  7 posterior stabilized femoral component, and the size 41 patella are cemented in place and the patella is held with the clamp. The trial insert is placed and the knee held in full extension. The Exparel (20 ml mixed with 60 ml saline) is injected into the extensor mechanism, posterior capsule, medial and lateral gutters and subcutaneous tissues.  All extruded cement is removed and once the cement is hard the permanent 12 mm posterior stabilized rotating platform insert is placed into the tibial tray.      The wound is copiously irrigated with saline solution and the extensor mechanism closed with # 0 Stratofix suture. The tourniquet is released for a total tourniquet time of 40  minutes. Flexion against gravity is 140 degrees and the patella tracks normally. Subcutaneous tissue is closed with 2.0 vicryl and subcuticular with running 4.0 Monocryl. The incision is cleaned and dried and steri-strips and a bulky sterile dressing are applied. The limb is placed into a knee immobilizer and the patient is awakened and transported to recovery in stable condition.      Please note that a surgical assistant was a medical necessity for this procedure in order to perform it in a safe and expeditious manner. Surgical assistant was necessary to retract the ligaments and vital neurovascular structures to prevent injury to them and also necessary for proper positioning of the limb to allow for anatomic placement of the prosthesis.   Glenn Plover Letticia Bhattacharyya, MD    04/24/2022, 8:27 AM

## 2022-04-24 NOTE — Anesthesia Procedure Notes (Signed)
Anesthesia Regional Block: Adductor canal block   Pre-Anesthetic Checklist: , timeout performed,  Correct Patient, Correct Site, Correct Laterality,  Correct Procedure, Correct Position, site marked,  Risks and benefits discussed,  Surgical consent,  Pre-op evaluation,  At surgeon's request and post-op pain management  Laterality: Right  Prep: chloraprep       Needles:  Injection technique: Single-shot  Needle Type: Echogenic Needle     Needle Length: 9cm  Needle Gauge: 21     Additional Needles:   Procedures:,,,, ultrasound used (permanent image in chart),,    Narrative:  Start time: 04/24/2022 6:48 AM End time: 04/24/2022 6:45 AM Injection made incrementally with aspirations every 5 mL.  Performed by: Personally  Anesthesiologist: Suzette Battiest, MD

## 2022-04-24 NOTE — Anesthesia Postprocedure Evaluation (Signed)
Anesthesia Post Note  Patient: Glenn Lyons  Procedure(s) Performed: TOTAL KNEE ARTHROPLASTY (Right: Knee)     Patient location during evaluation: PACU Anesthesia Type: MAC Level of consciousness: awake and alert Pain management: pain level controlled Vital Signs Assessment: post-procedure vital signs reviewed and stable Respiratory status: spontaneous breathing and respiratory function stable Cardiovascular status: blood pressure returned to baseline and stable Postop Assessment: spinal receding Anesthetic complications: no   No notable events documented.  Last Vitals:  Vitals:   04/24/22 1010 04/24/22 1229  BP: (!) 159/93 (!) 153/87  Pulse: 61 (!) 59  Resp: 17 16  Temp: 36.6 C 36.6 C  SpO2: 100% 98%    Last Pain:  Vitals:   04/24/22 1344  TempSrc:   PainSc: 0-No pain                 Tiajuana Amass

## 2022-04-24 NOTE — Progress Notes (Signed)
Orthopedic Tech Progress Note Patient Details:  Glenn Lyons 1956-01-01 [id number]  CPM Right Knee CPM Right Knee: On Right Knee Flexion (Degrees): 40 Right Knee Extension (Degrees): 10  Post Interventions Patient Tolerated: Well  Linus Salmons Nyrah Demos 04/24/2022, 9:23 AM

## 2022-04-24 NOTE — Transfer of Care (Signed)
Immediate Anesthesia Transfer of Care Note  Patient: Captain Blucher Cisar  Procedure(s) Performed: Procedure(s): TOTAL KNEE ARTHROPLASTY (Right)  Patient Location: PACU  Anesthesia Type:MAC, Regional, and Spinal  Level of Consciousness: Patient easily awoken, sedated, comfortable, cooperative, following commands, responds to stimulation.   Airway & Oxygen Therapy: Patient spontaneously breathing, ventilating well, oxygen via simple oxygen mask.  Post-op Assessment: Report given to PACU RN, vital signs reviewed and stable.   Post vital signs: Reviewed and stable.  Complications: No apparent anesthesia complications  Last Vitals:  Vitals Value Taken Time  BP 113/65 04/24/22 0849  Temp    Pulse 60 04/24/22 0851  Resp 11 04/24/22 0851  SpO2 96 % 04/24/22 0851  Vitals shown include unvalidated device data.  Last Pain:  Vitals:   04/24/22 0606  TempSrc: Oral  PainSc:          Complications: No notable events documented.

## 2022-04-24 NOTE — Interval H&P Note (Signed)
History and Physical Interval Note:  04/24/2022 6:26 AM  Glenn Lyons  has presented today for surgery, with the diagnosis of right knee osteoarthritis.  The various methods of treatment have been discussed with the patient and family. After consideration of risks, benefits and other options for treatment, the patient has consented to  Procedure(s): TOTAL KNEE ARTHROPLASTY (Right) as a surgical intervention.  The patient's history has been reviewed, patient examined, no change in status, stable for surgery.  I have reviewed the patient's chart and labs.  Questions were answered to the patient's satisfaction.     Pilar Plate Mafalda Mcginniss

## 2022-04-24 NOTE — Anesthesia Procedure Notes (Signed)
Spinal  Patient location during procedure: OR Start time: 04/24/2022 7:16 AM End time: 04/24/2022 7:21 AM Reason for block: surgical anesthesia Staffing Performed: anesthesiologist  Anesthesiologist: Suzette Battiest, MD Performed by: Suzette Battiest, MD Authorized by: Suzette Battiest, MD   Preanesthetic Checklist Completed: patient identified, IV checked, site marked, risks and benefits discussed, surgical consent, monitors and equipment checked, pre-op evaluation and timeout performed Spinal Block Patient position: sitting Prep: DuraPrep Patient monitoring: heart rate, cardiac monitor, continuous pulse ox and blood pressure Approach: midline Location: L3-4 Injection technique: single-shot Needle Needle type: Pencan  Needle gauge: 24 G Needle length: 9 cm Assessment Sensory level: T4 Events: CSF return

## 2022-04-25 ENCOUNTER — Encounter (HOSPITAL_COMMUNITY): Payer: Self-pay | Admitting: Orthopedic Surgery

## 2022-04-25 DIAGNOSIS — M1711 Unilateral primary osteoarthritis, right knee: Secondary | ICD-10-CM | POA: Diagnosis not present

## 2022-04-25 LAB — BASIC METABOLIC PANEL
Anion gap: 8 (ref 5–15)
BUN: 14 mg/dL (ref 8–23)
CO2: 27 mmol/L (ref 22–32)
Calcium: 9.3 mg/dL (ref 8.9–10.3)
Chloride: 101 mmol/L (ref 98–111)
Creatinine, Ser: 0.91 mg/dL (ref 0.61–1.24)
GFR, Estimated: >60 mL/min (ref >60)
Glucose, Bld: 177 mg/dL — ABNORMAL HIGH (ref 70–99)
Potassium: 4 mmol/L (ref 3.5–5.1)
Sodium: 136 mmol/L (ref 135–145)

## 2022-04-25 LAB — CBC
HCT: 43 % (ref 39.0–52.0)
Hemoglobin: 14.2 g/dL (ref 13.0–17.0)
MCH: 31.5 pg (ref 26.0–34.0)
MCHC: 33 g/dL (ref 30.0–36.0)
MCV: 95.3 fL (ref 80.0–100.0)
Platelets: 223 10*3/uL (ref 150–400)
RBC: 4.51 MIL/uL (ref 4.22–5.81)
RDW: 13.2 % (ref 11.5–15.5)
WBC: 16.7 10*3/uL — ABNORMAL HIGH (ref 4.0–10.5)
nRBC: 0 % (ref 0.0–0.2)

## 2022-04-25 LAB — GLUCOSE, CAPILLARY
Glucose-Capillary: 164 mg/dL — ABNORMAL HIGH (ref 70–99)
Glucose-Capillary: 174 mg/dL — ABNORMAL HIGH (ref 70–99)

## 2022-04-25 MED ORDER — METHOCARBAMOL 500 MG PO TABS: 500.0000 mg | ORAL_TABLET | Freq: Four times a day (QID) | ORAL | 0 refills | Status: DC | PRN

## 2022-04-25 MED ORDER — RIVAROXABAN 10 MG PO TABS: 10.0000 mg | ORAL_TABLET | Freq: Every day | ORAL | 0 refills | Status: AC

## 2022-04-25 MED ORDER — GABAPENTIN 300 MG PO CAPS: ORAL_CAPSULE | ORAL | 0 refills | Status: DC

## 2022-04-25 MED ORDER — TRAMADOL HCL 50 MG PO TABS: 50.0000 mg | ORAL_TABLET | Freq: Four times a day (QID) | ORAL | 0 refills | Status: DC | PRN

## 2022-04-25 MED ORDER — OXYCODONE HCL 5 MG PO TABS: 5.0000 mg | ORAL_TABLET | Freq: Four times a day (QID) | ORAL | 0 refills | Status: DC | PRN

## 2022-04-25 NOTE — Progress Notes (Signed)
Physical Therapy Treatment Patient Details Name: Glenn Lyons MRN: [id number] DOB: 10-22-1955 Today's Date: 04/25/2022   History of Present Illness Pt is a 66yo male presenting s/p R-TKA on 04/24/22. 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    PT Comments    POD # 1 am session General Comments: AxO x 3 motivated and had prior TKR in March.  "I am hoping I do better now that I had my other knee done". Pt already OOB in recliner, ate breakfast and pre medicated. Assisted with amb in hallway.  General transfer comment: 25% VC's to extend LE prior to sit and stand to decrease pain.  General Gait Details: Pt ambulated with RW and min guard, no physical assist required or overt LOB noted.  Functional distance.General stair comments: 50% VC's on proper sequencing esp since recent TKR "other" knee.  Practiced one step twice. Then returned to room to perform some TE's following HEP handout.  Instructed on proper tech, freq as well as use of ICE.   Addressed all mobility questions, discussed appropriate activity, educated on use of ICE.  Pt ready for D/C to home.   Recommendations for follow up therapy are one component of a multi-disciplinary discharge planning process, led by the attending physician.  Recommendations may be updated based on patient status, additional functional criteria and insurance authorization.  Follow Up Recommendations  Follow physician's recommendations for discharge plan and follow up therapies     Assistance Recommended at Discharge Frequent or constant Supervision/Assistance  Patient can return home with the following A little help with walking and/or transfers;A little help with bathing/dressing/bathroom;Assistance with feeding;Assist for transportation;Help with stairs or ramp for entrance   Equipment Recommendations  None recommended by PT    Recommendations for Other Services        Precautions / Restrictions Precautions Precautions: Fall;Knee Precaution Comments: no pillow under the knee Restrictions Weight Bearing Restrictions: No Other Position/Activity Restrictions: WBAT     Mobility  Bed Mobility               General bed mobility comments: OOB in recliner    Transfers Overall transfer level: Needs assistance Equipment used: Rolling walker (2 wheels) Transfers: Sit to/from Stand Sit to Stand: Supervision, Min guard           General transfer comment: 25% VC's to extend LE prior to sit and stand to decrease pain    Ambulation/Gait Ambulation/Gait assistance: Supervision, Min guard Gait Distance (Feet): 55 Feet Assistive device: Rolling walker (2 wheels) Gait Pattern/deviations: Step-to pattern, Decreased stance time - right Gait velocity: decreased     General Gait Details: Pt ambulated with RW and min guard, no physical assist required or overt LOB noted.  Functional distance.   Stairs Stairs: Yes Stairs assistance: Min guard, Min assist Stair Management: No rails, Step to pattern, Forwards Number of Stairs: 1 General stair comments: 50% VC's on proper sequencing esp since recent TKR "other" knee.  Practiced one step twice.   Wheelchair Mobility    Modified Rankin (Stroke Patients Only)       Balance                                            Cognition Arousal/Alertness: Awake/alert   Overall Cognitive Status: Within Functional Limits for tasks assessed  General Comments: AxO x 3 motivated and had prior TKR in March.  "I am hoping I do better now that I had my other knee done".        Exercises  Total Knee Replacement TE's following HEP handout 10 reps B LE ankle pumps 05 reps towel squeezes 05 reps knee presses 05 reps heel slides  05 reps SAQ's 05 reps SLR's 05 reps ABD Educated on use of gait belt to assist with TE's Followed by ICE      General Comments        Pertinent Vitals/Pain Pain Assessment Pain Assessment: 0-10 Pain Score: 5  Pain Location: R knee Pain Descriptors / Indicators: Operative site guarding, Discomfort, Tender Pain Intervention(s): Premedicated before session, Repositioned, Ice applied    Home Living                          Prior Function            PT Goals (current goals can now be found in the care plan section) Progress towards PT goals: Progressing toward goals    Frequency    7X/week      PT Plan Current plan remains appropriate    Co-evaluation              AM-PAC PT "6 Clicks" Mobility   Outcome Measure  Help needed turning from your back to your side while in a flat bed without using bedrails?: None Help needed moving from lying on your back to sitting on the side of a flat bed without using bedrails?: None Help needed moving to and from a bed to a chair (including a wheelchair)?: None Help needed standing up from a chair using your arms (e.g., wheelchair or bedside chair)?: A Little Help needed to walk in hospital room?: A Little Help needed climbing 3-5 steps with a railing? : A Little 6 Click Score: 21    End of Session Equipment Utilized During Treatment: Gait belt Activity Tolerance: Patient tolerated treatment well;No increased pain Patient left: in chair;with call bell/phone within reach;with chair alarm set Nurse Communication: Mobility status PT Visit Diagnosis: Pain;Difficulty in walking, not elsewhere classified (R26.2) Pain - Right/Left: Right Pain - part of body: Knee     Time: 0902-0927 PT Time Calculation (min) (ACUTE ONLY): 25 min  Charges:  $Gait Training: 8-22 mins $Therapeutic Exercise: 8-22 mins                     {Audriella Blakeley  PTA Acute  [employer] M-F          [phone] Weekend pager [phone]

## 2022-04-25 NOTE — Progress Notes (Signed)
Subjective: 1 Day Post-Op Procedure(s) (LRB): TOTAL KNEE ARTHROPLASTY (Right) Patient reports pain as mild.   Patient seen in rounds by Dr. Wynelle Link. Patient is well, and has had no acute complaints or problems No issues overnight. Denies chest pain, SOB, or calf pain. Foley catheter removed this AM.  We will continue therapy today, ambulated 24' yesterday.   Objective: Vital signs in last 24 hours: Temp:  [97.5 F (36.4 C)-98.4 F (36.9 C)] 98 F (36.7 C) (11/28 0609) Pulse Rate:  [59-70] 60 (11/28 0609) Resp:  [12-18] 17 (11/28 0609) BP: (84-159)/(61-93) 109/62 (11/28 0609) SpO2:  [91 %-100 %] 100 % (11/28 0609)  Intake/Output from previous day:  Intake/Output Summary (Last 24 hours) at 04/25/2022 0745 Last data filed at 04/25/2022 0600 Gross per 24 hour  Intake 3703.78 ml  Output 4210 ml  Net -506.22 ml     Intake/Output this shift: No intake/output data recorded.  Labs: Recent Labs    04/25/22 0333  HGB 14.2   Recent Labs    04/25/22 0333  WBC 16.7*  RBC 4.51  HCT 43.0  PLT 223   Recent Labs    04/25/22 0333  NA 136  K 4.0  CL 101  CO2 27  BUN 14  CREATININE 0.91  GLUCOSE 177*  CALCIUM 9.3   No results for input(s): "LABPT", "INR" in the last 72 hours.  Exam: General - Patient is Alert and Oriented Extremity - Neurologically intact Neurovascular intact Sensation intact distally Dorsiflexion/Plantar flexion intact Dressing - dressing C/D/I Motor Function - intact, moving foot and toes well on exam.   Past Medical History:  Diagnosis Date   Achalasia    with prev eval at [facility].  No intervention as of 2012   Arthritis    Aspiration pneumonia (HCC)    Backache, unspecified    Cardiac pacemaker St. Jude    ERI 2008   Depressive disorder, not elsewhere classified    Diabetes ([place])    Hypertension    Morbid obesity ([place])    Obstructive sleep apnea    biapap settign 25-22   Presence of permanent cardiac pacemaker    St. Jude- Dr. Caryl Comes  follows -device Check 07-06-14   Sinus bradycardia /pauses    Stricture and stenosis of esophagus     Assessment/Plan: 1 Day Post-Op Procedure(s) (LRB): TOTAL KNEE ARTHROPLASTY (Right) Principal Problem:   Arthritis of knee Active Problems:   Osteoarthritis of right knee  Estimated body mass index is 40.96 kg/m as calculated from the following:   Height as of this encounter: 6' (1.829 m).   Weight as of this encounter: 137 kg. Advance diet Up with therapy D/C IV fluids   Patient's anticipated LOS is less than 2 midnights, meeting these requirements: - Younger than 89 - Lives within 1 hour of care - Has a competent adult at home to recover with post-op recover - NO history of  - Chronic pain requiring opioids  - Coronary Artery Disease  - Heart failure  - Heart attack  - Stroke  - DVT/VTE  - Respiratory Failure/COPD  - Renal failure  - Anemia  - Advanced Liver disease  DVT Prophylaxis - Xarelto Weight bearing as tolerated. Continue therapy.  Plan is to go Home after hospital stay. Plan for discharge later today if progresses with therapy and meeting goals. Scheduled [facility]). Follow-up in the office in 2 weeks.  The PDMP database was reviewed today prior to any opioid medications being prescribed  to this patient.  Theresa Duty, PA-C Orthopedic Surgery [phone] 04/25/2022, 7:45 AM

## 2022-04-25 NOTE — Plan of Care (Signed)
  Problem: Education: Goal: Ability to describe self-care measures that may prevent or decrease complications (Diabetes Survival Skills Education) will improve Outcome: Progressing   Problem: Coping: Goal: Ability to adjust to condition or change in health will improve Outcome: Progressing   Problem: Fluid Volume: Goal: Ability to maintain a balanced intake and output will improve Outcome: Progressing   Problem: Health Behavior/Discharge Planning: Goal: Ability to identify and utilize available resources and services will improve Outcome: Progressing Goal: Ability to manage health-related needs will improve Outcome: Progressing   Problem: Metabolic: Goal: Ability to maintain appropriate glucose levels will improve Outcome: Progressing   Problem: Activity: Goal: Ability to avoid complications of mobility impairment will improve Outcome: Progressing Goal: Range of joint motion will improve Outcome: Progressing

## 2022-04-25 NOTE — TOC Transition Note (Addendum)
Transition of Care [facility]) - CM/SW Discharge Note   Patient Details  Name: MATEEN FRANSSEN MRN: [id number] Date of Birth: November 12, 1955  [facility] CM/SW Contact:  Lennart Pall, LCSW Phone Number: 04/25/2022, 9:55 AM   Clinical Narrative:     Met with pt and confirming he has needed DME at home.  OPPT already arranged with [facility] ([place]).  No further TOC needs.  Final next level of care: OP Rehab Barriers to Discharge: No Barriers Identified   Patient Goals and CMS Choice Patient states their goals for this hospitalization and ongoing recovery are:: return home      Discharge Placement                       Discharge Plan and Services                DME Arranged: N/A DME Agency: NA                  Social Determinants of Health (SDOH) Interventions     Readmission Risk Interventions     No data to display

## 2022-04-25 NOTE — Plan of Care (Signed)
Problem: Education: Goal: Ability to describe self-care measures that may prevent or decrease complications (Diabetes Survival Skills Education) will improve 04/25/2022 1317 by Jonn Shingles, LPN Outcome: Adequate for Discharge 04/25/2022 0820 by Jonn Shingles, LPN Outcome: Progressing   Problem: Coping: Goal: Ability to adjust to condition or change in health will improve 04/25/2022 1317 by Jonn Shingles, LPN Outcome: Adequate for Discharge 04/25/2022 0820 by Jonn Shingles, LPN Outcome: Progressing   Problem: Fluid Volume: Goal: Ability to maintain a balanced intake and output will improve 04/25/2022 1317 by Jonn Shingles, LPN Outcome: Adequate for Discharge 04/25/2022 0820 by Jonn Shingles, LPN Outcome: Progressing   Problem: Health Behavior/Discharge Planning: Goal: Ability to identify and utilize available resources and services will improve 04/25/2022 1317 by Jonn Shingles, LPN Outcome: Adequate for Discharge 04/25/2022 0820 by Jonn Shingles, LPN Outcome: Progressing Goal: Ability to manage health-related needs will improve 04/25/2022 1317 by Jonn Shingles, LPN Outcome: Adequate for Discharge 04/25/2022 0820 by Jonn Shingles, LPN Outcome: Progressing   Problem: Metabolic: Goal: Ability to maintain appropriate glucose levels will improve 04/25/2022 1317 by Jonn Shingles, LPN Outcome: Adequate for Discharge 04/25/2022 0820 by Jonn Shingles, LPN Outcome: Progressing   Problem: Nutritional: Goal: Maintenance of adequate nutrition will improve Outcome: Adequate for Discharge Goal: Progress toward achieving an optimal weight will improve Outcome: Adequate for Discharge   Problem: Skin Integrity: Goal: Risk for impaired skin integrity will decrease Outcome: Adequate for Discharge   Problem: Tissue Perfusion: Goal: Adequacy of tissue perfusion will improve Outcome: Adequate for Discharge   Problem: Education: Goal: Knowledge of the  prescribed therapeutic regimen will improve Outcome: Adequate for Discharge   Problem: Activity: Goal: Ability to avoid complications of mobility impairment will improve 04/25/2022 1317 by Jonn Shingles, LPN Outcome: Adequate for Discharge 04/25/2022 0820 by Jonn Shingles, LPN Outcome: Progressing Goal: Range of joint motion will improve 04/25/2022 1317 by Jonn Shingles, LPN Outcome: Adequate for Discharge 04/25/2022 0820 by Jonn Shingles, LPN Outcome: Progressing   Problem: Clinical Measurements: Goal: Postoperative complications will be avoided or minimized Outcome: Adequate for Discharge   Problem: Pain Management: Goal: Pain level will decrease with appropriate interventions Outcome: Adequate for Discharge   Problem: Skin Integrity: Goal: Will show signs of wound healing Outcome: Adequate for Discharge   Problem: Education: Goal: Knowledge of General Education information will improve Description: Including pain rating scale, medication(s)/side effects and non-pharmacologic comfort measures Outcome: Adequate for Discharge   Problem: Health Behavior/Discharge Planning: Goal: Ability to manage health-related needs will improve Outcome: Adequate for Discharge   Problem: Clinical Measurements: Goal: Ability to maintain clinical measurements within normal limits will improve Outcome: Adequate for Discharge Goal: Will remain free from infection Outcome: Adequate for Discharge Goal: Diagnostic test results will improve Outcome: Adequate for Discharge Goal: Respiratory complications will improve Outcome: Adequate for Discharge Goal: Cardiovascular complication will be avoided Outcome: Adequate for Discharge   Problem: Activity: Goal: Risk for activity intolerance will decrease Outcome: Adequate for Discharge   Problem: Coping: Goal: Level of anxiety will decrease Outcome: Adequate for Discharge   Problem: Elimination: Goal: Will not experience  complications related to bowel motility Outcome: Adequate for Discharge Goal: Will not experience complications related to urinary retention Outcome: Adequate for Discharge   Problem: Pain Managment: Goal: General experience of comfort will improve Outcome: Adequate for Discharge   Problem: Safety: Goal: Ability to remain free from injury will improve Outcome: Adequate for Discharge   Problem: Skin Integrity: Goal: Risk for impaired skin integrity will decrease Outcome: Adequate for Discharge

## 2022-04-26 NOTE — Discharge Summary (Signed)
Patient ID: Glenn Lyons MRN: [id number] DOB/AGE: 64-Apr-1957 64 y.o.  Admit date: 04/24/2022 Discharge date: 04/25/2022  Admission Diagnoses:  Principal Problem:   Arthritis of knee Active Problems:   Osteoarthritis of right knee   Discharge Diagnoses:  Same  Past Medical History:  Diagnosis Date   Achalasia    with prev eval at [facility].  No intervention as of 2012   Arthritis    Aspiration pneumonia ([place])    Backache, unspecified    Cardiac pacemaker St. Jude    ERI 2008   Depressive disorder, not elsewhere classified    Diabetes ([place])    Hypertension    Morbid obesity ([place])    Obstructive sleep apnea    biapap settign 25-22   Presence of permanent cardiac pacemaker    St. Jude- Dr. Caryl Comes follows -device Check 07-06-14   Sinus bradycardia /pauses    Stricture and stenosis of esophagus     Surgeries: Procedure(s): TOTAL KNEE ARTHROPLASTY on 04/24/2022   Consultants:   Discharged Condition: Improved  Hospital Course: Glenn Lyons is an 66 y.o. male who was admitted 04/24/2022 for operative treatment ofArthritis of knee. Patient has severe unremitting pain that affects sleep, daily activities, and work/hobbies. After pre-op clearance the patient was taken to the operating room on 04/24/2022 and underwent  Procedure(s): TOTAL KNEE ARTHROPLASTY.    Patient was given perioperative antibiotics:  Anti-infectives (From admission, onward)    Start     Dose/Rate Route Frequency Ordered Stop   04/24/22 1400  ceFAZolin (ANCEF) IVPB 2g/100 mL premix        2 g 200 mL/hr over 30 Minutes Intravenous Every 6 hours 04/24/22 1001 04/24/22 2040   04/24/22 0600  ceFAZolin (ANCEF) IVPB 2g/100 mL premix        2 g 200 mL/hr over 30 Minutes Intravenous On call to O.R. 04/24/22 0532 04/24/22 1884        Patient was given sequential compression devices, early ambulation, and chemoprophylaxis to prevent DVT.  Patient benefited maximally from hospital stay and there were no  complications.    Recent vital signs: No data found.   Recent laboratory studies:  Recent Labs    04/25/22 0333  WBC 16.7*  HGB 14.2  HCT 43.0  PLT 223  NA 136  K 4.0  CL 101  CO2 27  BUN 14  CREATININE 0.91  GLUCOSE 177*  CALCIUM 9.3     Discharge Medications:   Allergies as of 04/25/2022       Reactions   Morphine Other (See Comments)   Cardiac Arrest   Metformin And Related    Intolerant of any dose due to aches        Medication List     STOP taking these medications    CALCIUM CITRATE PO   multivitamin tablet       TAKE these medications    acetaminophen 500 MG tablet Commonly known as: TYLENOL Take 1,000 mg by mouth every 6 (six) hours as needed for moderate pain.   gabapentin 300 MG capsule Commonly known as: NEURONTIN Take a 300 mg capsule three times a day for two weeks following surgery.Then take a 300 mg capsule two times a day for two weeks. Then take a 300 mg capsule once a day for two weeks. Then discontinue.   methocarbamol 500 MG tablet Commonly known as: ROBAXIN Take 1 tablet (500 mg total) by mouth every 6 (six) hours as needed for muscle spasms.   onetouch ultrasoft  lancets USE TO TEST BLOOD SUGAR ONCE DAILY OR AS NEEDED   OneTouch Verio test strip Generic drug: glucose blood USE TO TEST BLOOD SUGAR ONCE DAILY OR AS INSTRUCTED   OneTouch Verio w/Device Kit Inject 1 kit into the skin every morning. Use to test blood sugar each day before breakfast and 2 hours after a meal for 2 weeks.  Diagnosis:  E11.9  Non-insulin dependent.   oxyCODONE 5 MG immediate release tablet Commonly known as: Oxy IR/ROXICODONE Take 1-2 tablets (5-10 mg total) by mouth every 6 (six) hours as needed for severe pain.   rivaroxaban 10 MG Tabs tablet Commonly known as: XARELTO Take 1 tablet (10 mg total) by mouth daily with breakfast for 20 days.   sildenafil 50 MG tablet Commonly known as: VIAGRA Take 50 mg by mouth daily as needed for erectile  dysfunction.   traMADol 50 MG tablet Commonly known as: ULTRAM Take 1-2 tablets (50-100 mg total) by mouth every 6 (six) hours as needed for moderate pain.   VICKS VAPOR INHALER IN Place 1 spray into both nostrils daily as needed (congestion).               Discharge Care Instructions  (From admission, onward)           Start     Ordered   04/25/22 0000  Weight bearing as tolerated        04/25/22 0747   04/25/22 0000  Change dressing       Comments: You may remove the bulky bandage (ACE wrap and gauze) two days after surgery. You will have an adhesive waterproof bandage underneath. Leave this in place until your first follow-up appointment.   04/25/22 0747            Diagnostic Studies: CUP PACEART REMOTE DEVICE CHECK  Result Date: 04/18/2022 Scheduled remote reviewed. Normal device function.  VHR episodes with EGM's showing short 1:1 as seen in previous reports, longest 11 min with rates 150-160's Next remote 91 days- JJB   Disposition: Discharge disposition: 01-Home or Self Care       Discharge Instructions     Call MD / Call 911   Complete by: As directed    If you experience chest pain or shortness of breath, CALL 911 and be transported to the hospital emergency room.  If you develope a fever above 101 F, pus (white drainage) or increased drainage or redness at the wound, or calf pain, call your surgeon's office.   Change dressing   Complete by: As directed    You may remove the bulky bandage (ACE wrap and gauze) two days after surgery. You will have an adhesive waterproof bandage underneath. Leave this in place until your first follow-up appointment.   Constipation Prevention   Complete by: As directed    Drink plenty of fluids.  Prune juice may be helpful.  You may use a stool softener, such as Colace (over the counter) 100 mg twice a day.  Use MiraLax (over the counter) for constipation as needed.   Diet - low sodium heart healthy   Complete by: As  directed    Do not put a pillow under the knee. Place it under the heel.   Complete by: As directed    Driving restrictions   Complete by: As directed    No driving for two weeks   Post-operative opioid taper instructions:   Complete by: As directed    POST-OPERATIVE OPIOID TAPER INSTRUCTIONS: It is important  to wean off of your opioid medication as soon as possible. If you do not need pain medication after your surgery it is ok to stop day one. Opioids include: Codeine, Hydrocodone(Norco, Vicodin), Oxycodone(Percocet, oxycontin) and hydromorphone amongst others.  Long term and even short term use of opiods can cause: Increased pain response Dependence Constipation Depression Respiratory depression And more.  Withdrawal symptoms can include Flu like symptoms Nausea, vomiting And more Techniques to manage these symptoms Hydrate well Eat regular healthy meals Stay active Use relaxation techniques(deep breathing, meditating, yoga) Do Not substitute Alcohol to help with tapering If you have been on opioids for less than two weeks and do not have pain than it is ok to stop all together.  Plan to wean off of opioids This plan should start within one week post op of your joint replacement. Maintain the same interval or time between taking each dose and first decrease the dose.  Cut the total daily intake of opioids by one tablet each day Next start to increase the time between doses. The last dose that should be eliminated is the evening dose.      TED hose   Complete by: As directed    Use stockings (TED hose) for three weeks on both leg(s).  You may remove them at night for sleeping.   Weight bearing as tolerated   Complete by: As directed         Follow-up Information     Aluisio, Pilar Plate, MD. Schedule an appointment as soon as possible for a visit in 2 week(s).   Specialty: Orthopedic Surgery Contact information: [address] [place]  [id number] [phone]                  Signed: Theresa Duty 04/26/2022, 10:41 AM

## 2022-04-27 ENCOUNTER — Encounter: Payer: Self-pay | Admitting: Physical Therapy

## 2022-04-27 ENCOUNTER — Ambulatory Visit: Payer: BC Managed Care – PPO | Attending: Orthopedic Surgery | Admitting: Physical Therapy

## 2022-04-27 DIAGNOSIS — R262 Difficulty in walking, not elsewhere classified: Secondary | ICD-10-CM | POA: Insufficient documentation

## 2022-04-27 DIAGNOSIS — R600 Localized edema: Secondary | ICD-10-CM | POA: Insufficient documentation

## 2022-04-27 DIAGNOSIS — M25561 Pain in right knee: Secondary | ICD-10-CM | POA: Insufficient documentation

## 2022-04-27 DIAGNOSIS — M25661 Stiffness of right knee, not elsewhere classified: Secondary | ICD-10-CM | POA: Insufficient documentation

## 2022-04-27 NOTE — Therapy (Signed)
OUTPATIENT PHYSICAL THERAPY LOWER EXTREMITY EVALUATION   Patient Name: Glenn Lyons MRN: [id number] DOB:03/05/1956, 66 y.o., male Today's Date: 04/27/2022  END OF SESSION:  PT End of Session - 04/27/22 1401     Visit Number 1    Date for PT Re-Evaluation 07/25/22    Authorization Type [employer]    PT Start Time 1400    PT Stop Time 1445    PT Time Calculation (min) 45 min    Equipment Utilized During Treatment Gait belt    Activity Tolerance Patient limited by pain    Behavior During Therapy WFL for tasks assessed/performed             Past Medical History:  Diagnosis Date   Achalasia    with prev eval at [facility].  No intervention as of 2012   Arthritis    Aspiration pneumonia ([facility])    Backache, unspecified    Cardiac pacemaker St. Jude    ERI 2008   Depressive disorder, not elsewhere classified    Diabetes ([place])    Hypertension    Morbid obesity ([place])    Obstructive sleep apnea    biapap settign 25-22   Presence of permanent cardiac pacemaker    St. Jude- Dr. Caryl Comes follows -device Check 07-06-14   Sinus bradycardia /pauses    Stricture and stenosis of esophagus    Past Surgical History:  Procedure Laterality Date   BALLOON DILATION N/A 10/06/2015   Procedure: BALLOON DILATION;  Surgeon: Mauri Pole, MD;  Location: [facility];  Service: Endoscopy;  Laterality: N/A;   BIOPSY  05/06/2018   Procedure: BIOPSY;  Surgeon: Rush Landmark Telford Nab., MD;  Location: [place];  Service: Gastroenterology;;   COLONOSCOPY W/ POLYPECTOMY  11/01/2010   COLONOSCOPY WITH PROPOFOL N/A 10/05/2014   Procedure: COLONOSCOPY WITH PROPOFOL;  Surgeon: Gatha Mayer, MD;  Location: [facility];  Service: Endoscopy;  Laterality: N/A;   COLONOSCOPY WITH PROPOFOL N/A 05/06/2018   Procedure: COLONOSCOPY WITH PROPOFOL;  Surgeon: Rush Landmark Telford Nab., MD;  Location: [place];  Service: Gastroenterology;  Laterality: N/A;   ESOPHAGEAL DILATION     ESOPHAGEAL MANOMETRY N/A 08/23/2015    Procedure: ESOPHAGEAL MANOMETRY (EM);  Surgeon: Mauri Pole, MD;  Location: [facility];  Service: Endoscopy;  Laterality: N/A;   ESOPHAGOGASTRODUODENOSCOPY N/A 10/09/2014   Procedure: ESOPHAGOGASTRODUODENOSCOPY (EGD);  Surgeon: Gatha Mayer, MD;  Location: [facility];  Service: Endoscopy;  Laterality: N/A;   ESOPHAGOGASTRODUODENOSCOPY (EGD) WITH PROPOFOL N/A 08/23/2015   Procedure: ESOPHAGOGASTRODUODENOSCOPY (EGD) WITH PROPOFOL;  Surgeon: Mauri Pole, MD;  Location: [facility];  Service: Endoscopy;  Laterality: N/A;   ESOPHAGOGASTRODUODENOSCOPY (EGD) WITH PROPOFOL N/A 10/06/2015   Procedure: ESOPHAGOGASTRODUODENOSCOPY (EGD) WITH PROPOFOL;  Surgeon: Mauri Pole, MD;  Location: [facility];  Service: Endoscopy;  Laterality: N/A;  Rigiflex ballon size 66m-35mm size 45 minute proc,need Fluro Gastografin esophagram 2 hrs post EGD    GASTRIC ROUX-EN-Y N/A 04/05/2020   Procedure: LAPAROSCOPIC ROUX-EN-Y GASTRIC BYPASS WITH UPPER ENDOSCOPY;  Surgeon: WGreer Pickerel MD;  [facility];  Service: General;  Laterality: N/A;   PACEMAKER INSERTION     POLYPECTOMY  05/06/2018   Procedure: POLYPECTOMY;  Surgeon: MIrving Copas, MD;  [place] [facility]  Service: Gastroenterology;;   PBetsy PriesGENERATOR CHANGEOUT N/A 05/01/2018   Procedure: PPM GENERATOR CHANGEOUT;  Surgeon: KDeboraha Sprang MD;  [facility];  Service: Cardiovascular;  Laterality: N/A;   TOTAL KNEE ARTHROPLASTY Left 08/15/2021   Procedure: TOTAL KNEE ARTHROPLASTY;  Surgeon: Gaynelle Arabian, MD;  [facility];  Service: Orthopedics;  Laterality: Left;   TOTAL KNEE ARTHROPLASTY Right 04/24/2022   Procedure: TOTAL KNEE ARTHROPLASTY;  Surgeon: Gaynelle Arabian, MD;  Location: [facility];  Service: Orthopedics;  Laterality: Right;   Patient Active Problem List   Diagnosis Date Noted   Osteoarthritis of right knee 04/24/2022   Sore throat 03/20/2022   Strep throat 03/20/2022   COVID-19 03/20/2022    Primary osteoarthritis of left knee 08/15/2021   Double vision 01/05/2021   S/P gastric bypass 04/05/2020   Need for shingles vaccine 01/28/2020   Foreskin problem 08/10/2019   HLD (hyperlipidemia) 08/05/2017   Routine general medical examination at a health care facility 07/13/2016   Advance care planning 07/13/2016   Type 2 diabetes mellitus with diabetic neuropathy, unspecified ([place]) 03/17/2016   Lower urinary tract symptoms (LUTS) 03/16/2016   Dysphagia    Essential hypertension    Hx of colonic polyps    Fatigue 08/04/2014   Rash and nonspecific skin eruption 10/21/2013   Morbid obesity with BMI of 50.0-59.9, adult ([place]) 05/24/2013   History of colonic polyps 04/29/2013   Anxiety 03/10/2013   Pain in joint, shoulder region 02/08/2013   Sinus bradycardia /pauses    Edema 01/31/2012   OA (osteoarthritis) of knee 06/15/2011   Paresthesia 01/08/2011   Achalasia 08/17/2010   Arthritis of knee 01/21/2010   PACEMAKER, St Judes 01/21/2010   Obesity 12/07/2009   OSA (obstructive sleep apnea) 12/07/2009    PCP: Damita Dunnings  REFERRING PROVIDER: Aluisio  REFERRING DIAG: right TKA  THERAPY DIAG:  Acute pain of right knee  Stiffness of right knee, not elsewhere classified  Difficulty in walking, not elsewhere classified  Localized edema  Rationale for Evaluation and Treatment: Rehabilitation  ONSET DATE: 04/24/22  SUBJECTIVE:   SUBJECTIVE STATEMENT: Patient underwent right TKA on 04/24/22, he had the left TKA August 15, 2021.  Reports that he has done well with a one night hospital stay  PERTINENT HISTORY: ICD/Pacemaker PAIN:  Are you having pain? Yes: NPRS scale: 3/10 Pain location: right knee Pain description: ache Aggravating factors: bending, walking pain up to 8/10 Relieving factors: rest, ice, pain meds at best pain a 2/10  PRECAUTIONS: ICD/Pacemaker  WEIGHT BEARING RESTRICTIONS: No  FALLS:  Has patient fallen in last 6 months? No  LIVING  ENVIRONMENT: Lives with: lives with their family Lives in: House/apartment Stairs: No Has following equipment at home: Environmental consultant - 4 wheeled  OCCUPATION: does work, may travel and have to walk a lot  PLOF: Independent with household mobility with device and Independent with community mobility with device, was using a SPC, would like to play golf again  PATIENT GOALS: walk without device, go up and down stairs, have no pain, play golf  NEXT MD VISIT:   OBJECTIVE:  PATIENT SURVEYS:  FOTO 14  COGNITION: Overall cognitive status: Within functional limits for tasks assessed     SENSATION: WFL  EDEMA:  Circumferential: left mid patella 50cm, right 59.5cm  POSTURE: rounded shoulders and forward head  PALPATION: Very tender   LOWER EXTREMITY ROM:  AROM/PROM Right AROM eval Right PROM eval  Hip flexion    Hip extension    Hip abduction    Hip adduction    Hip internal rotation    Hip external rotation    Knee flexion 80 85  Knee extension 35 30  Ankle dorsiflexion    Ankle plantarflexion    Ankle inversion    Ankle eversion     (  Blank rows = not tested)  LOWER EXTREMITY MMT:  MMT Right eval Left eval  Hip flexion    Hip extension    Hip abduction    Hip adduction    Hip internal rotation    Hip external rotation    Knee flexion 3+   Knee extension 2+   Ankle dorsiflexion    Ankle plantarflexion    Ankle inversion    Ankle eversion     (Blank rows = not tested)  FUNCTIONAL TESTS:  Timed up and go (TUG): 36 seconds  GAIT: Distance walked: 100 feet Assistive device utilized: Walker - 4 wheeled Level of assistance: SBA Comments: step to patter   TODAY'S TREATMENT:                                                                                                                              DATE:     PATIENT EDUCATION:  Education details: HEP/POC Person educated: Patient Education method: Consulting civil engineer, Media planner, Verbal cues, and  Handouts Education comprehension: verbalized understanding  HOME EXERCISE PROGRAM: Access Code: [email or web address] URL: [email or web address] Date: 04/27/2022 Prepared by: Lum Babe  Exercises - Supine Heel Slide  - 2 x daily - 7 x weekly - 1 sets - 10 reps - 10 hold - Supine Quad Set  - 2 x daily - 7 x weekly - 1 sets - 10 reps - 2 hold - Seated Long Arc Quad  - 2 x daily - 7 x weekly - 1 sets - 10 reps - 2 hold - Seated Heel Slide  - 2 x daily - 7 x weekly - 1 sets - 10 reps - 10 hold  ASSESSMENT:  CLINICAL IMPRESSION: Patient is a 66 y.o. male who was seen today for physical therapy evaluation and treatment for right TKA.  He has limited ROM and a step to pattern.  Has significant swelling, we did see him in the spring for the left TKA and he is doing better this go round.   OBJECTIVE IMPAIRMENTS: Abnormal gait, cardiopulmonary status limiting activity, decreased activity tolerance, decreased balance, decreased mobility, difficulty walking, decreased ROM, decreased strength, increased edema, increased muscle spasms, impaired flexibility, and pain.   REHAB POTENTIAL: Good  CLINICAL DECISION MAKING: Evolving/moderate complexity  EVALUATION COMPLEXITY: Low   GOALS: Goals reviewed with patient? Yes  SHORT TERM GOALS: Target date: 05/10/22 Independent with initial HEP Goal status: INITIAL  2.  Safe with walking and transfers in the clinic Goal status: INITIAL LONG TERM GOALS: Target date: 07/26/22  Independent with advanced HEP Goal status: INITIAL  2.  Decrease pain 50% Goal status: INITIAL  3.  Increase knee AROM to 5-115 degrees flexion Goal status: INITIAL  4.  Go up and down stairs step over step Goal status: INITIAL  5.  Increase strength to 4=/5 Goal status: INITIAL  PLAN:  PT FREQUENCY: 3x/week  PT DURATION: 12 weeks  PLANNED INTERVENTIONS: Therapeutic exercises, Therapeutic activity, Neuromuscular re-education, Balance training,  Gait  training, Patient/Family education, Self Care, Joint mobilization, Joint manipulation, Electrical stimulation, Cryotherapy, Vasopneumatic device, and Manual therapy  PLAN FOR NEXT SESSION: slowly start gym activities, go slow as last time he had a setback with doing too much too soon   Micole Delehanty W, PT 04/27/2022, 2:02 PM

## 2022-05-01 ENCOUNTER — Encounter: Payer: Self-pay | Admitting: Physical Therapy

## 2022-05-01 ENCOUNTER — Ambulatory Visit: Payer: BC Managed Care – PPO | Attending: Orthopedic Surgery | Admitting: Physical Therapy

## 2022-05-01 DIAGNOSIS — M25661 Stiffness of right knee, not elsewhere classified: Secondary | ICD-10-CM | POA: Diagnosis present

## 2022-05-01 DIAGNOSIS — R262 Difficulty in walking, not elsewhere classified: Secondary | ICD-10-CM | POA: Diagnosis present

## 2022-05-01 DIAGNOSIS — R600 Localized edema: Secondary | ICD-10-CM | POA: Diagnosis present

## 2022-05-01 DIAGNOSIS — M25561 Pain in right knee: Secondary | ICD-10-CM | POA: Insufficient documentation

## 2022-05-01 NOTE — Therapy (Signed)
OUTPATIENT PHYSICAL THERAPY LOWER EXTREMITY EVALUATION   Patient Name: Glenn Lyons MRN: [id number] DOB:1956-03-12, 66 y.o., male Today's Date: 05/01/2022  END OF SESSION:  PT End of Session - 05/01/22 1720     Visit Number 2    Date for PT Re-Evaluation 07/25/22    PT Start Time 7672    PT Stop Time 0947    PT Time Calculation (min) 38 min    Activity Tolerance Patient limited by pain    Behavior During Therapy [facility] for tasks assessed/performed              Past Medical History:  Diagnosis Date   Achalasia    with prev eval at [facility]  No intervention as of 2012   Arthritis    Aspiration pneumonia (HCC)    Backache, unspecified    Cardiac pacemaker St. Jude    ERI 2008   Depressive disorder, not elsewhere classified    Diabetes ([place])    Hypertension    Morbid obesity ([place])    Obstructive sleep apnea    biapap settign 25-22   Presence of permanent cardiac pacemaker    St. Jude- Dr. Caryl Comes follows -device Check 07-06-14   Sinus bradycardia /pauses    Stricture and stenosis of esophagus    Past Surgical History:  Procedure Laterality Date   BALLOON DILATION N/A 10/06/2015   Procedure: BALLOON DILATION;  Surgeon: Mauri Pole, MD;  Location: [place] [facility];  Service: Endoscopy;  Laterality: N/A;   BIOPSY  05/06/2018   Procedure: BIOPSY;  Surgeon: Rush Landmark Telford Nab., MD;  Location: [place];  Service: Gastroenterology;;   COLONOSCOPY W/ POLYPECTOMY  11/01/2010   COLONOSCOPY WITH PROPOFOL N/A 10/05/2014   Procedure: COLONOSCOPY WITH PROPOFOL;  Surgeon: Gatha Mayer, MD;  Location: [facility];  Service: Endoscopy;  Laterality: N/A;   COLONOSCOPY WITH PROPOFOL N/A 05/06/2018   Procedure: COLONOSCOPY WITH PROPOFOL;  Surgeon: Rush Landmark Telford Nab., MD;  Location: [place];  Service: Gastroenterology;  Laterality: N/A;   ESOPHAGEAL DILATION     ESOPHAGEAL MANOMETRY N/A 08/23/2015   Procedure: ESOPHAGEAL MANOMETRY (EM);  Surgeon: Mauri Pole, MD;   [facility];  Service: Endoscopy;  Laterality: N/A;   ESOPHAGOGASTRODUODENOSCOPY N/A 10/09/2014   Procedure: ESOPHAGOGASTRODUODENOSCOPY (EGD);  Surgeon: Gatha Mayer, MD;  [facility];  Service: Endoscopy;  Laterality: N/A;   ESOPHAGOGASTRODUODENOSCOPY (EGD) WITH PROPOFOL N/A 08/23/2015   Procedure: ESOPHAGOGASTRODUODENOSCOPY (EGD) WITH PROPOFOL;  Surgeon: Mauri Pole, MD;  [facility];  Service: Endoscopy;  Laterality: N/A;   ESOPHAGOGASTRODUODENOSCOPY (EGD) WITH PROPOFOL N/A 10/06/2015   Procedure: ESOPHAGOGASTRODUODENOSCOPY (EGD) WITH PROPOFOL;  Surgeon: Mauri Pole, MD;  Location: [place] [facility];  Service: Endoscopy;  Laterality: N/A;  Rigiflex ballon size 78m-35mm size 45 minute proc,need Fluro Gastografin esophagram 2 hrs post EGD    GASTRIC ROUX-EN-Y N/A 04/05/2020   Procedure: LAPAROSCOPIC ROUX-EN-Y GASTRIC BYPASS WITH UPPER ENDOSCOPY;  Surgeon: WGreer Pickerel MD;  [facility];  Service: General;  Laterality: N/A;   PACEMAKER INSERTION     POLYPECTOMY  05/06/2018   Procedure: POLYPECTOMY;  Surgeon: MIrving Copas, MD;  [place]  Service: Gastroenterology;;   PBetsy PriesGENERATOR CHANGEOUT N/A 05/01/2018   Procedure: PPM GENERATOR CHANGEOUT;  Surgeon: KDeboraha Sprang MD;  [place] [facility];  Service: Cardiovascular;  Laterality: N/A;   TOTAL KNEE ARTHROPLASTY Left 08/15/2021   Procedure: TOTAL KNEE ARTHROPLASTY;  Surgeon: AGaynelle Arabian MD;  [facility];  Service: Orthopedics;  Laterality: Left;  TOTAL KNEE ARTHROPLASTY Right 04/24/2022   Procedure: TOTAL KNEE ARTHROPLASTY;  Surgeon: Gaynelle Arabian, MD;  [facility];  Service: Orthopedics;  Laterality: Right;   Patient Active Problem List   Diagnosis Date Noted   Osteoarthritis of right knee 04/24/2022   Sore throat 03/20/2022   Strep throat 03/20/2022   COVID-19 03/20/2022   Primary osteoarthritis of left knee 08/15/2021   Double vision  01/05/2021   S/P gastric bypass 04/05/2020   Need for shingles vaccine 01/28/2020   Foreskin problem 08/10/2019   HLD (hyperlipidemia) 08/05/2017   Routine general medical examination at a health care facility 07/13/2016   Advance care planning 07/13/2016   Type 2 diabetes mellitus with diabetic neuropathy, unspecified ([place]) 03/17/2016   Lower urinary tract symptoms (LUTS) 03/16/2016   Dysphagia    Essential hypertension    Hx of colonic polyps    Fatigue 08/04/2014   Rash and nonspecific skin eruption 10/21/2013   Morbid obesity with BMI of 50.0-59.9, adult ([place]) 05/24/2013   History of colonic polyps 04/29/2013   Anxiety 03/10/2013   Pain in joint, shoulder region 02/08/2013   Sinus bradycardia /pauses    Edema 01/31/2012   OA (osteoarthritis) of knee 06/15/2011   Paresthesia 01/08/2011   Achalasia 08/17/2010   Arthritis of knee 01/21/2010   PACEMAKER, St Judes 01/21/2010   Obesity 12/07/2009   OSA (obstructive sleep apnea) 12/07/2009    PCP: Damita Dunnings  REFERRING PROVIDER: Aluisio  REFERRING DIAG: right TKA  THERAPY DIAG:  Acute pain of right knee  Stiffness of right knee, not elsewhere classified  Difficulty in walking, not elsewhere classified  Localized edema  Rationale for Evaluation and Treatment: Rehabilitation  ONSET DATE: 04/24/22  SUBJECTIVE:   SUBJECTIVE STATEMENT: Patient reports that he feels he is doing better than after his last TKR. He is still sleeping in the recliner due to sleep apnea, not wanting to disturb his wife.  PERTINENT HISTORY: ICD/Pacemaker PAIN:  Are you having pain? Yes: NPRS scale: 3/10 Pain location: right knee Pain description: ache Aggravating factors: bending, walking pain up to 8/10 Relieving factors: rest, ice, pain meds at best pain a 2/10  PRECAUTIONS: ICD/Pacemaker  WEIGHT BEARING RESTRICTIONS: No  FALLS:  Has patient fallen in last 6 months? No  LIVING ENVIRONMENT: Lives with: lives with their family Lives  in: House/apartment Stairs: No Has following equipment at home: Environmental consultant - 4 wheeled  OCCUPATION: does work, may travel and have to walk a lot  PLOF: Independent with household mobility with device and Independent with community mobility with device, was using a SPC, would like to play golf again  PATIENT GOALS: walk without device, go up and down stairs, have no pain, play golf  NEXT MD VISIT:   OBJECTIVE:  PATIENT SURVEYS:  FOTO 14  COGNITION: Overall cognitive status: Within functional limits for tasks assessed     SENSATION: WFL  EDEMA:  Circumferential: left mid patella 50cm, right 59.5cm  POSTURE: rounded shoulders and forward head  PALPATION: Very tender   LOWER EXTREMITY ROM:  AROM/PROM Right AROM eval Right PROM eval  Hip flexion    Hip extension    Hip abduction    Hip adduction    Hip internal rotation    Hip external rotation    Knee flexion 80 85  Knee extension 35 30  Ankle dorsiflexion    Ankle plantarflexion    Ankle inversion    Ankle eversion     (Blank rows = not tested)  LOWER EXTREMITY  MMT:  MMT Right eval Left eval  Hip flexion    Hip extension    Hip abduction    Hip adduction    Hip internal rotation    Hip external rotation    Knee flexion 3+   Knee extension 2+   Ankle dorsiflexion    Ankle plantarflexion    Ankle inversion    Ankle eversion     (Blank rows = not tested)  FUNCTIONAL TESTS:  Timed up and go (TUG): 36 seconds  GAIT: Distance walked: 100 feet Assistive device utilized: Walker - 4 wheeled Level of assistance: SBA Comments: step to pattern   TODAY'S TREATMENT:                                                                                                                              DATE:   05/01/22 NuStep L3 x 4 min with U and LE. Supine strengthening, HS, SAQ, knee press with heel elevated, SLR-10 reps each  Knee AROM 26-90 Seated strengthening-Knee flexion against red tband, long arc quad 10  each Standing heel raises x 10 reps  PATIENT EDUCATION:  Education details: HEP/POC Person educated: Patient Education method: Consulting civil engineer, Media planner, Verbal cues, and Handouts Education comprehension: verbalized understanding  HOME EXERCISE PROGRAM: Access Code: [email or web address] URL: [email or web address] Date: 04/27/2022 Prepared by: Lum Babe  Exercises - Supine Heel Slide  - 2 x daily - 7 x weekly - 1 sets - 10 reps - 10 hold - Supine Quad Set  - 2 x daily - 7 x weekly - 1 sets - 10 reps - 2 hold - Seated Long Arc Quad  - 2 x daily - 7 x weekly - 1 sets - 10 reps - 2 hold - Seated Heel Slide  - 2 x daily - 7 x weekly - 1 sets - 10 reps - 10 hold  ASSESSMENT:  CLINICAL IMPRESSION: Patient feels he doing  better than last time. Progressed strengthening and ROM of R knee, tolerated well, able to perform all activities and demonstrated improved ROM.  OBJECTIVE IMPAIRMENTS: Abnormal gait, cardiopulmonary status limiting activity, decreased activity tolerance, decreased balance, decreased mobility, difficulty walking, decreased ROM, decreased strength, increased edema, increased muscle spasms, impaired flexibility, and pain.   REHAB POTENTIAL: Good  CLINICAL DECISION MAKING: Evolving/moderate complexity  EVALUATION COMPLEXITY: Low   GOALS: Goals reviewed with patient? Yes  SHORT TERM GOALS: Target date: 05/10/22 Independent with initial HEP Goal status: Met  2.  Safe with walking and transfers in the clinic Goal status: INITIAL LONG TERM GOALS: Target date: 07/26/22  Independent with advanced HEP Goal status: INITIAL  2.  Decrease pain 50% Goal status: ongoing  3.  Increase knee AROM to 5-115 degrees flexion Goal status: INITIAL  4.  Go up and down stairs step over step Goal status: INITIAL  5.  Increase strength to 4=/5 Goal status: INITIAL  PLAN:  PT FREQUENCY: 3x/week  PT DURATION: 12 weeks  PLANNED INTERVENTIONS:  Therapeutic  exercises, Therapeutic activity, Neuromuscular re-education, Balance training, Gait training, Patient/Family education, Self Care, Joint mobilization, Joint manipulation, Electrical stimulation, Cryotherapy, Vasopneumatic device, and Manual therapy  PLAN FOR NEXT SESSION: slowly start gym activities, go slow as last time he had a setback with doing too much too soon   Marcelina Morel, DPT 05/01/2022, 5:59 PM

## 2022-05-04 ENCOUNTER — Encounter: Payer: Self-pay | Admitting: Physical Therapy

## 2022-05-04 ENCOUNTER — Ambulatory Visit: Payer: BC Managed Care – PPO | Admitting: Physical Therapy

## 2022-05-04 DIAGNOSIS — M25561 Pain in right knee: Secondary | ICD-10-CM | POA: Diagnosis not present

## 2022-05-04 DIAGNOSIS — M25661 Stiffness of right knee, not elsewhere classified: Secondary | ICD-10-CM

## 2022-05-04 DIAGNOSIS — R600 Localized edema: Secondary | ICD-10-CM

## 2022-05-04 DIAGNOSIS — R262 Difficulty in walking, not elsewhere classified: Secondary | ICD-10-CM

## 2022-05-04 NOTE — Therapy (Signed)
OUTPATIENT PHYSICAL THERAPY LOWER EXTREMITY TREATMENT   Patient Name: Glenn Lyons MRN: [id number] DOB:05/14/1956, 66 y.o., male Today's Date: 05/04/2022  END OF SESSION:  PT End of Session - 05/04/22 1436     Visit Number 3    Date for PT Re-Evaluation 07/25/22    PT Start Time 1436    PT Stop Time 1515    PT Time Calculation (min) 39 min    Activity Tolerance Patient limited by pain    Behavior During Therapy WFL for tasks assessed/performed              Past Medical History:  Diagnosis Date   Achalasia    with prev eval at [facility].  No intervention as of 2012   Arthritis    Aspiration pneumonia (HCC)    Backache, unspecified    Cardiac pacemaker St. Jude    ERI 2008   Depressive disorder, not elsewhere classified    Diabetes (HCC)    Hypertension    Morbid obesity (HCC)    Obstructive sleep apnea    biapap settign 25-22   Presence of permanent cardiac pacemaker    St. Jude- Dr. Klein follows -device Check 07-06-14   Sinus bradycardia /pauses    Stricture and stenosis of esophagus    Past Surgical History:  Procedure Laterality Date   BALLOON DILATION N/A 10/06/2015   Procedure: BALLOON DILATION;  Surgeon: Kavitha Nandigam V, MD;  Location: [facility];  Service: Endoscopy;  Laterality: N/A;   BIOPSY  05/06/2018   Procedure: BIOPSY;  Surgeon: Mansouraty, Gabriel Jr., MD;  Location: [facility];  Service: Gastroenterology;;   COLONOSCOPY W/ POLYPECTOMY  11/01/2010   COLONOSCOPY WITH PROPOFOL N/A 10/05/2014   Procedure: COLONOSCOPY WITH PROPOFOL;  Surgeon: Carl E Gessner, MD;  [facility];  Service: Endoscopy;  Laterality: N/A;   COLONOSCOPY WITH PROPOFOL N/A 05/06/2018   Procedure: COLONOSCOPY WITH PROPOFOL;  Surgeon: Mansouraty, Gabriel Jr., MD;  Location: [facility];  Service: Gastroenterology;  Laterality: N/A;   ESOPHAGEAL DILATION     ESOPHAGEAL MANOMETRY N/A 08/23/2015   Procedure: ESOPHAGEAL MANOMETRY (EM);  Surgeon: Kavitha Nandigam V, MD;   [facility];  Service: Endoscopy;  Laterality: N/A;   ESOPHAGOGASTRODUODENOSCOPY N/A 10/09/2014   Procedure: ESOPHAGOGASTRODUODENOSCOPY (EGD);  Surgeon: Carl E Gessner, MD;  [facility];  Service: Endoscopy;  Laterality: N/A;   ESOPHAGOGASTRODUODENOSCOPY (EGD) WITH PROPOFOL N/A 08/23/2015   Procedure: ESOPHAGOGASTRODUODENOSCOPY (EGD) WITH PROPOFOL;  Surgeon: Kavitha Nandigam V, MD;  [facility];  Service: Endoscopy;  Laterality: N/A;   ESOPHAGOGASTRODUODENOSCOPY (EGD) WITH PROPOFOL N/A 10/06/2015   Procedure: ESOPHAGOGASTRODUODENOSCOPY (EGD) WITH PROPOFOL;  Surgeon: Kavitha Nandigam V, MD;  Location: [facility];  Service: Endoscopy;  Laterality: N/A;  Rigiflex ballon size 30mm-35mm size 45 minute proc,need Fluro Gastografin esophagram 2 hrs post EGD    GASTRIC ROUX-EN-Y N/A 04/05/2020   Procedure: LAPAROSCOPIC ROUX-EN-Y GASTRIC BYPASS WITH UPPER ENDOSCOPY;  Surgeon: Wilson, Eric, MD;  [facility];  Service: General;  Laterality: N/A;   PACEMAKER INSERTION     POLYPECTOMY  05/06/2018   Procedure: POLYPECTOMY;  Surgeon: Mansouraty, Gabriel Jr., MD;  Location: [facility];  Service: Gastroenterology;;   PPM GENERATOR CHANGEOUT N/A 05/01/2018   Procedure: PPM GENERATOR CHANGEOUT;  Surgeon: Klein, Steven C, MD;  [facility];  Service: Cardiovascular;  Laterality: N/A;   TOTAL KNEE ARTHROPLASTY Left 08/15/2021   Procedure: TOTAL KNEE ARTHROPLASTY;  Surgeon: Aluisio, Frank, MD;  [facility];  Service: Orthopedics;  Laterality: Left;     TOTAL KNEE ARTHROPLASTY Right 04/24/2022   Procedure: TOTAL KNEE ARTHROPLASTY;  Surgeon: Aluisio, Frank, MD;  [facility];  Service: Orthopedics;  Laterality: Right;   Patient Active Problem List   Diagnosis Date Noted   Osteoarthritis of right knee 04/24/2022   Sore throat 03/20/2022   Strep throat 03/20/2022   COVID-19 03/20/2022   Primary osteoarthritis of left knee 08/15/2021   Double vision  01/05/2021   S/P gastric bypass 04/05/2020   Need for shingles vaccine 01/28/2020   Foreskin problem 08/10/2019   HLD (hyperlipidemia) 08/05/2017   Routine general medical examination at a health care facility 07/13/2016   Advance care planning 07/13/2016   Type 2 diabetes mellitus with diabetic neuropathy, unspecified (HCC) 03/17/2016   Lower urinary tract symptoms (LUTS) 03/16/2016   Dysphagia    Essential hypertension    Hx of colonic polyps    Fatigue 08/04/2014   Rash and nonspecific skin eruption 10/21/2013   Morbid obesity with BMI of 50.0-59.9, adult (HCC) 05/24/2013   History of colonic polyps 04/29/2013   Anxiety 03/10/2013   Pain in joint, shoulder region 02/08/2013   Sinus bradycardia /pauses    Edema 01/31/2012   OA (osteoarthritis) of knee 06/15/2011   Paresthesia 01/08/2011   Achalasia 08/17/2010   Arthritis of knee 01/21/2010   PACEMAKER, [employer] Judes 01/21/2010   Obesity 12/07/2009   OSA (obstructive sleep apnea) 12/07/2009    PCP: [facility]  REFERRING PROVIDER: Aluisio  REFERRING DIAG: right TKA  THERAPY DIAG:  Stiffness of right knee, not elsewhere classified  Acute pain of right knee  Difficulty in walking, not elsewhere classified  Localized edema  Rationale for Evaluation and Treatment: Rehabilitation  ONSET DATE: 04/24/22  SUBJECTIVE:   SUBJECTIVE STATEMENT: "Its just swollen a lot, and when its swollen it hurts like the dickens"  PERTINENT HISTORY: ICD/Pacemaker PAIN:  Are you having pain? Yes: NPRS scale: 7/10 Pain location: right knee Pain description: ache Aggravating factors: bending, walking pain up to 8/10 Relieving factors: rest, ice, pain meds at best pain a 2/10  PRECAUTIONS: ICD/Pacemaker  WEIGHT BEARING RESTRICTIONS: No  FALLS:  Has patient fallen in last 6 months? No  LIVING ENVIRONMENT: Lives with: lives with their family Lives in: House/apartment Stairs: No Has following equipment at home: Walker - 4  wheeled  OCCUPATION: does work, may travel and have to walk a lot  PLOF: Independent with household mobility with device and Independent with community mobility with device, was using a SPC, would like to play golf again  PATIENT GOALS: walk without device, go up and down stairs, have no pain, play golf  NEXT MD VISIT:   OBJECTIVE:  PATIENT SURVEYS:  FOTO 14  COGNITION: Overall cognitive status: Within functional limits for tasks assessed     SENSATION: WFL  EDEMA:  Circumferential: left mid patella 50cm, right 59.5cm  POSTURE: rounded shoulders and forward head  PALPATION: Very tender   LOWER EXTREMITY ROM:  AROM/PROM Right AROM eval Right PROM eval  Hip flexion    Hip extension    Hip abduction    Hip adduction    Hip internal rotation    Hip external rotation    Knee flexion 80 85  Knee extension 35 30  Ankle dorsiflexion    Ankle plantarflexion    Ankle inversion    Ankle eversion     (Blank rows = not tested)  LOWER EXTREMITY MMT:  MMT Right eval Left eval  Hip flexion    Hip extension      Hip abduction    Hip adduction    Hip internal rotation    Hip external rotation    Knee flexion 3+   Knee extension 2+   Ankle dorsiflexion    Ankle plantarflexion    Ankle inversion    Ankle eversion     (Blank rows = not tested)  FUNCTIONAL TESTS:  Timed up and go (TUG): 36 seconds  GAIT: Distance walked: 100 feet Assistive device utilized: Walker - 4 wheeled Level of assistance: SBA Comments: step to pattern   TODAY'S TREATMENT:                                                                                                                              DATE:   05/04/22 NuStep L4 x 6 min R knee PROM  RLE LAQ 2lb 2x10 RLE Hamstring curls Red 2x10  S2S x10 x5  SAQ RLE x15 SLR RLE x10     05/01/22 NuStep L3 x 4 min with U and LE. Supine strengthening, HS, SAQ, knee press with heel elevated, SLR-10 reps each  Knee AROM 26-90 Seated  strengthening-Knee flexion against red tband, long arc quad 10 each Standing heel raises x 10 reps  PATIENT EDUCATION:  Education details: HEP/POC Person educated: Patient Education method: Explanation, Demonstration, Verbal cues, and Handouts Education comprehension: verbalized understanding  HOME EXERCISE PROGRAM: Access Code: [email or web address] URL: [email or web address] Date: 04/27/2022 Prepared by: Michael Albright  Exercises - Supine Heel Slide  - 2 x daily - 7 x weekly - 1 sets - 10 reps - 10 hold - Supine Quad Set  - 2 x daily - 7 x weekly - 1 sets - 10 reps - 2 hold - Seated Long Arc Quad  - 2 x daily - 7 x weekly - 1 sets - 10 reps - 2 hold - Seated Heel Slide  - 2 x daily - 7 x weekly - 1 sets - 10 reps - 10 hold  ASSESSMENT:  CLINICAL IMPRESSION: Pt enters with reports of pain. Continued to  Progressed strengthening and ROM of R knee, tolerated well, able to perform all activities and have  good ROM. Some compensation with sit to stands.  OBJECTIVE IMPAIRMENTS: Abnormal gait, cardiopulmonary status limiting activity, decreased activity tolerance, decreased balance, decreased mobility, difficulty walking, decreased ROM, decreased strength, increased edema, increased muscle spasms, impaired flexibility, and pain.   REHAB POTENTIAL: Good  CLINICAL DECISION MAKING: Evolving/moderate complexity  EVALUATION COMPLEXITY: Low   GOALS: Goals reviewed with patient? Yes  SHORT TERM GOALS: Target date: 05/10/22 Independent with initial HEP Goal status: Met  2.  Safe with walking and transfers in the clinic Goal status: INITIAL LONG TERM GOALS: Target date: 07/26/22  Independent with advanced HEP Goal status: INITIAL  2.  Decrease pain 50% Goal status: ongoing  3.  Increase knee AROM to 5-115 degrees flexion Goal status: INITIAL  4.  Go up and down stairs step over step Goal status: INITIAL    5.  Increase strength to 4=/5 Goal status: INITIAL  PLAN:  PT  FREQUENCY: 3x/week  PT DURATION: 12 weeks  PLANNED INTERVENTIONS: Therapeutic exercises, Therapeutic activity, Neuromuscular re-education, Balance training, Gait training, Patient/Family education, Self Care, Joint mobilization, Joint manipulation, Electrical stimulation, Cryotherapy, Vasopneumatic device, and Manual therapy  PLAN FOR NEXT SESSION: slowly start gym activities, go slow as last time he had a setback with doing too much too soon   Susan M Varga, DPT 05/04/2022, 2:36 PM  

## 2022-05-08 ENCOUNTER — Ambulatory Visit: Payer: BC Managed Care – PPO | Admitting: Physical Therapy

## 2022-05-08 ENCOUNTER — Encounter: Payer: Self-pay | Admitting: Physical Therapy

## 2022-05-08 DIAGNOSIS — R262 Difficulty in walking, not elsewhere classified: Secondary | ICD-10-CM

## 2022-05-08 DIAGNOSIS — M25561 Pain in right knee: Secondary | ICD-10-CM | POA: Diagnosis not present

## 2022-05-08 DIAGNOSIS — M25661 Stiffness of right knee, not elsewhere classified: Secondary | ICD-10-CM

## 2022-05-08 NOTE — Therapy (Signed)
OUTPATIENT PHYSICAL THERAPY LOWER EXTREMITY TREATMENT   Patient Name: Glenn Lyons MRN: [id number] DOB:[birth year], 66 y.o., male Today's Date: 05/08/2022  END OF SESSION:  PT End of Session - 05/08/22 1433     Visit Number 4    Date for PT Re-Evaluation 07/25/22    PT Start Time 1430    PT Stop Time 1515    PT Time Calculation (min) 45 min    Activity Tolerance Patient tolerated treatment well    Behavior During Therapy [facility] for tasks assessed/performed              Past Medical History:  Diagnosis Date   Achalasia    with prev eval at [facility].  No intervention as of 2012   Arthritis    Aspiration pneumonia (HCC)    Backache, unspecified    Cardiac pacemaker St. Jude    ERI 2008   Depressive disorder, not elsewhere classified    Diabetes ([place])    Hypertension    Morbid obesity ([place])    Obstructive sleep apnea    biapap settign 25-22   Presence of permanent cardiac pacemaker    St. Jude- Dr. Caryl Comes follows -device Check 07-06-14   Sinus bradycardia /pauses    Stricture and stenosis of esophagus    Past Surgical History:  Procedure Laterality Date   BALLOON DILATION N/A 10/06/2015   Procedure: BALLOON DILATION;  Surgeon: Mauri Pole, MD;  Location: [place] [facility];  Service: Endoscopy;  Laterality: N/A;   BIOPSY  05/06/2018   Procedure: BIOPSY;  Surgeon: Rush Landmark Telford Nab., MD;  Location: [place];  Service: Gastroenterology;;   COLONOSCOPY W/ POLYPECTOMY  11/01/2010   COLONOSCOPY WITH PROPOFOL N/A 10/05/2014   Procedure: COLONOSCOPY WITH PROPOFOL;  Surgeon: Gatha Mayer, MD;  [facility];  Service: Endoscopy;  Laterality: N/A;   COLONOSCOPY WITH PROPOFOL N/A 05/06/2018   Procedure: COLONOSCOPY WITH PROPOFOL;  Surgeon: Rush Landmark Telford Nab., MD;  Location: [place];  Service: Gastroenterology;  Laterality: N/A;   ESOPHAGEAL DILATION     ESOPHAGEAL MANOMETRY N/A 08/23/2015   Procedure: ESOPHAGEAL MANOMETRY (EM);  Surgeon: Mauri Pole,  MD;  [facility];  Service: Endoscopy;  Laterality: N/A;   ESOPHAGOGASTRODUODENOSCOPY N/A 10/09/2014   Procedure: ESOPHAGOGASTRODUODENOSCOPY (EGD);  Surgeon: Gatha Mayer, MD;  Location: [facility];  Service: Endoscopy;  Laterality: N/A;   ESOPHAGOGASTRODUODENOSCOPY (EGD) WITH PROPOFOL N/A 08/23/2015   Procedure: ESOPHAGOGASTRODUODENOSCOPY (EGD) WITH PROPOFOL;  Surgeon: Mauri Pole, MD;  [facility];  Service: Endoscopy;  Laterality: N/A;   ESOPHAGOGASTRODUODENOSCOPY (EGD) WITH PROPOFOL N/A 10/06/2015   Procedure: ESOPHAGOGASTRODUODENOSCOPY (EGD) WITH PROPOFOL;  Surgeon: Mauri Pole, MD;  Location: [place] [facility];  Service: Endoscopy;  Laterality: N/A;  Rigiflex ballon size 56m-35mm size 45 minute proc,need Fluro Gastografin esophagram 2 hrs post EGD    GASTRIC ROUX-EN-Y N/A 04/05/2020   Procedure: LAPAROSCOPIC ROUX-EN-Y GASTRIC BYPASS WITH UPPER ENDOSCOPY;  Surgeon: WGreer Pickerel MD;  [facility];  Service: General;  Laterality: N/A;   PACEMAKER INSERTION     POLYPECTOMY  05/06/2018   Procedure: POLYPECTOMY;  Surgeon: MIrving Copas, MD;  [place]  Service: Gastroenterology;;   PBetsy PriesGENERATOR CHANGEOUT N/A 05/01/2018   Procedure: PPM GENERATOR CHANGEOUT;  Surgeon: KDeboraha Sprang MD;  [place] [facility];  Service: Cardiovascular;  Laterality: N/A;   TOTAL KNEE ARTHROPLASTY Left 08/15/2021   Procedure: TOTAL KNEE ARTHROPLASTY;  Surgeon: AGaynelle Arabian MD;  [facility];  Service: Orthopedics;  Laterality: Left;  TOTAL KNEE ARTHROPLASTY Right 04/24/2022   Procedure: TOTAL KNEE ARTHROPLASTY;  Surgeon: Gaynelle Arabian, MD;  [facility];  Service: Orthopedics;  Laterality: Right;   Patient Active Problem List   Diagnosis Date Noted   Osteoarthritis of right knee 04/24/2022   Sore throat 03/20/2022   Strep throat 03/20/2022   COVID-19 03/20/2022   Primary osteoarthritis of left knee 08/15/2021   Double vision  01/05/2021   S/P gastric bypass 04/05/2020   Need for shingles vaccine 01/28/2020   Foreskin problem 08/10/2019   HLD (hyperlipidemia) 08/05/2017   Routine general medical examination at a health care facility 07/13/2016   Advance care planning 07/13/2016   Type 2 diabetes mellitus with diabetic neuropathy, unspecified ([place]) 03/17/2016   Lower urinary tract symptoms (LUTS) 03/16/2016   Dysphagia    Essential hypertension    Hx of colonic polyps    Fatigue 08/04/2014   Rash and nonspecific skin eruption 10/21/2013   Morbid obesity with BMI of 50.0-59.9, adult ([place]) 05/24/2013   History of colonic polyps 04/29/2013   Anxiety 03/10/2013   Pain in joint, shoulder region 02/08/2013   Sinus bradycardia /pauses    Edema 01/31/2012   OA (osteoarthritis) of knee 06/15/2011   Paresthesia 01/08/2011   Achalasia 08/17/2010   Arthritis of knee 01/21/2010   PACEMAKER, [employer] Judes 01/21/2010   Obesity 12/07/2009   OSA (obstructive sleep apnea) 12/07/2009    PCP: Damita Dunnings  REFERRING PROVIDER: Aluisio  REFERRING DIAG: right TKA  THERAPY DIAG:  Stiffness of right knee, not elsewhere classified  Difficulty in walking, not elsewhere classified  Acute pain of right knee  Rationale for Evaluation and Treatment: Rehabilitation  ONSET DATE: 04/24/22  SUBJECTIVE:   SUBJECTIVE STATEMENT: Front part of thigh is knotted  PERTINENT HISTORY: ICD/Pacemaker PAIN:  Are you having pain? Yes: NPRS scale: 0/10 Pain location: right knee Pain description: ache Aggravating factors: bending, walking pain up to 8/10 Relieving factors: rest, ice, pain meds at best pain a 2/10  PRECAUTIONS: ICD/Pacemaker  WEIGHT BEARING RESTRICTIONS: No  FALLS:  Has patient fallen in last 6 months? No  LIVING ENVIRONMENT: Lives with: lives with their family Lives in: House/apartment Stairs: No Has following equipment at home: Environmental consultant - 4 wheeled  OCCUPATION: does work, may travel and have to walk a  lot  PLOF: Independent with household mobility with device and Independent with community mobility with device, was using a SPC, would like to play golf again  PATIENT GOALS: walk without device, go up and down stairs, have no pain, play golf  NEXT MD VISIT:   OBJECTIVE:  PATIENT SURVEYS:  FOTO 14  COGNITION: Overall cognitive status: Within functional limits for tasks assessed     SENSATION: WFL  EDEMA:  Circumferential: left mid patella 50cm, right 59.5cm  POSTURE: rounded shoulders and forward head  PALPATION: Very tender   LOWER EXTREMITY ROM:  AROM/PROM Right AROM eval Right PROM eval  Hip flexion    Hip extension    Hip abduction    Hip adduction    Hip internal rotation    Hip external rotation    Knee flexion 80 85  Knee extension 35 30  Ankle dorsiflexion    Ankle plantarflexion    Ankle inversion    Ankle eversion     (Blank rows = not tested)  LOWER EXTREMITY MMT:  MMT Right eval Left eval  Hip flexion    Hip extension    Hip abduction    Hip adduction  Hip internal rotation    Hip external rotation    Knee flexion 3+   Knee extension 2+   Ankle dorsiflexion    Ankle plantarflexion    Ankle inversion    Ankle eversion     (Blank rows = not tested)  FUNCTIONAL TESTS:  Timed up and go (TUG): 36 seconds  GAIT: Distance walked: 100 feet Assistive device utilized: Walker - 4 wheeled Level of assistance: SBA Comments: step to pattern   TODAY'S TREATMENT:                                                                                                                              DATE:   05/08/22 NuStep L5 x 6 min  R knee PROM S2S 2x10 Leg press 40lb 2x10, RLE TKE 20lb 2x10 LAQ RLE 3lb 2x10  RLE hamstring curls green 2x12  05/04/22 NuStep L4 x 6 min R knee PROM  RLE LAQ 2lb 2x10 RLE Hamstring curls Red 2x10  S2S x10 x5  SAQ RLE x15 SLR RLE x10     05/01/22 NuStep L3 x 4 min with U and LE. Supine strengthening, HS,  SAQ, knee press with heel elevated, SLR-10 reps each  Knee AROM 26-90 Seated strengthening-Knee flexion against red tband, long arc quad 10 each Standing heel raises x 10 reps  PATIENT EDUCATION:  Education details: HEP/POC Person educated: Patient Education method: Consulting civil engineer, Media planner, Verbal cues, and Handouts Education comprehension: verbalized understanding  HOME EXERCISE PROGRAM: Access Code: [email or web address] URL: [email or web address] Date: 04/27/2022 Prepared by: Lum Babe  Exercises - Supine Heel Slide  - 2 x daily - 7 x weekly - 1 sets - 10 reps - 10 hold - Supine Quad Set  - 2 x daily - 7 x weekly - 1 sets - 10 reps - 2 hold - Seated Long Arc Quad  - 2 x daily - 7 x weekly - 1 sets - 10 reps - 2 hold - Seated Heel Slide  - 2 x daily - 7 x weekly - 1 sets - 10 reps - 10 hold  ASSESSMENT:  CLINICAL IMPRESSION: Pt enters doing well. Continued to  Progressed strengthening and ROM of R knee. Compensation remained with sit to stands. Pt did well with all interventions. Good effort throughout session   OBJECTIVE IMPAIRMENTS: Abnormal gait, cardiopulmonary status limiting activity, decreased activity tolerance, decreased balance, decreased mobility, difficulty walking, decreased ROM, decreased strength, increased edema, increased muscle spasms, impaired flexibility, and pain.   REHAB POTENTIAL: Good  CLINICAL DECISION MAKING: Evolving/moderate complexity  EVALUATION COMPLEXITY: Low   GOALS: Goals reviewed with patient? Yes  SHORT TERM GOALS: Target date: 05/10/22 Independent with initial HEP Goal status: Met  2.  Safe with walking and transfers in the clinic Goal status: INITIAL LONG TERM GOALS: Target date: 07/26/22  Independent with advanced HEP Goal status: INITIAL  2.  Decrease pain 50% Goal status: ongoing  3.  Increase knee AROM to 5-115 degrees flexion  Goal status: INITIAL  4.  Go up and down stairs step over step Goal status:  INITIAL  5.  Increase strength to 4=/5 Goal status: INITIAL  PLAN:  PT FREQUENCY: 3x/week  PT DURATION: 12 weeks  PLANNED INTERVENTIONS: Therapeutic exercises, Therapeutic activity, Neuromuscular re-education, Balance training, Gait training, Patient/Family education, Self Care, Joint mobilization, Joint manipulation, Electrical stimulation, Cryotherapy, Vasopneumatic device, and Manual therapy  PLAN FOR NEXT SESSION: slowly start gym activities, go slow as last time he had a setback with doing too much too soon   Marcelina Morel, DPT 05/08/2022, 2:34 PM

## 2022-05-10 ENCOUNTER — Ambulatory Visit: Payer: BC Managed Care – PPO | Admitting: Physical Therapy

## 2022-05-12 ENCOUNTER — Encounter: Payer: Self-pay | Admitting: Physical Therapy

## 2022-05-12 ENCOUNTER — Ambulatory Visit: Payer: BC Managed Care – PPO | Admitting: Physical Therapy

## 2022-05-12 DIAGNOSIS — M25561 Pain in right knee: Secondary | ICD-10-CM

## 2022-05-12 DIAGNOSIS — M25661 Stiffness of right knee, not elsewhere classified: Secondary | ICD-10-CM

## 2022-05-12 DIAGNOSIS — R262 Difficulty in walking, not elsewhere classified: Secondary | ICD-10-CM

## 2022-05-12 DIAGNOSIS — R600 Localized edema: Secondary | ICD-10-CM

## 2022-05-12 NOTE — Therapy (Signed)
OUTPATIENT PHYSICAL THERAPY LOWER EXTREMITY TREATMENT   Patient Name: Glenn Lyons MRN: [id number] DOB:1955-06-13, 66 y.o., male Today's Date: 05/12/2022  END OF SESSION:  PT End of Session - 05/12/22 1101     Visit Number 5    Date for PT Re-Evaluation 07/25/22    PT Start Time 1100    PT Stop Time 1145    PT Time Calculation (min) 45 min    Activity Tolerance Patient tolerated treatment well    Behavior During Therapy [facility] for tasks assessed/performed              Past Medical History:  Diagnosis Date   Achalasia    with prev eval at [facility].  No intervention as of 2012   Arthritis    Aspiration pneumonia (HCC)    Backache, unspecified    Cardiac pacemaker St. Jude    ERI 2008   Depressive disorder, not elsewhere classified    Diabetes ([place])    Hypertension    Morbid obesity ([place])    Obstructive sleep apnea    biapap settign 25-22   Presence of permanent cardiac pacemaker    St. Jude- Dr. Caryl Comes follows -device Check 07-06-14   Sinus bradycardia /pauses    Stricture and stenosis of esophagus    Past Surgical History:  Procedure Laterality Date   BALLOON DILATION N/A 10/06/2015   Procedure: BALLOON DILATION;  Surgeon: Mauri Pole, MD;  Location: [place] [facility];  Service: Endoscopy;  Laterality: N/A;   BIOPSY  05/06/2018   Procedure: BIOPSY;  Surgeon: Rush Landmark Telford Nab., MD;  Location: [place];  Service: Gastroenterology;;   COLONOSCOPY W/ POLYPECTOMY  11/01/2010   COLONOSCOPY WITH PROPOFOL N/A 10/05/2014   Procedure: COLONOSCOPY WITH PROPOFOL;  Surgeon: Gatha Mayer, MD;  [facility];  Service: Endoscopy;  Laterality: N/A;   COLONOSCOPY WITH PROPOFOL N/A 05/06/2018   Procedure: COLONOSCOPY WITH PROPOFOL;  Surgeon: Rush Landmark Telford Nab., MD;  Location: [place];  Service: Gastroenterology;  Laterality: N/A;   ESOPHAGEAL DILATION     ESOPHAGEAL MANOMETRY N/A 08/23/2015   Procedure: ESOPHAGEAL MANOMETRY (EM);  Surgeon: Mauri Pole,  MD;  [facility];  Service: Endoscopy;  Laterality: N/A;   ESOPHAGOGASTRODUODENOSCOPY N/A 10/09/2014   Procedure: ESOPHAGOGASTRODUODENOSCOPY (EGD);  Surgeon: Gatha Mayer, MD;  [facility];  Service: Endoscopy;  Laterality: N/A;   ESOPHAGOGASTRODUODENOSCOPY (EGD) WITH PROPOFOL N/A 08/23/2015   Procedure: ESOPHAGOGASTRODUODENOSCOPY (EGD) WITH PROPOFOL;  Surgeon: Mauri Pole, MD;  [facility];  Service: Endoscopy;  Laterality: N/A;   ESOPHAGOGASTRODUODENOSCOPY (EGD) WITH PROPOFOL N/A 10/06/2015   Procedure: ESOPHAGOGASTRODUODENOSCOPY (EGD) WITH PROPOFOL;  Surgeon: Mauri Pole, MD;  Location: [place] [facility];  Service: Endoscopy;  Laterality: N/A;  Rigiflex ballon size 39m-35mm size 45 minute proc,need Fluro Gastografin esophagram 2 hrs post EGD    GASTRIC ROUX-EN-Y N/A 04/05/2020   Procedure: LAPAROSCOPIC ROUX-EN-Y GASTRIC BYPASS WITH UPPER ENDOSCOPY;  Surgeon: WGreer Pickerel MD;  [facility];  Service: General;  Laterality: N/A;   PACEMAKER INSERTION     POLYPECTOMY  05/06/2018   Procedure: POLYPECTOMY;  Surgeon: MIrving Copas, MD;  [place]  Service: Gastroenterology;;   PBetsy PriesGENERATOR CHANGEOUT N/A 05/01/2018   Procedure: PPM GENERATOR CHANGEOUT;  Surgeon: KDeboraha Sprang MD;  [place] [place];  Service: Cardiovascular;  Laterality: N/A;   TOTAL KNEE ARTHROPLASTY Left 08/15/2021   Procedure: TOTAL KNEE ARTHROPLASTY;  Surgeon: AGaynelle Arabian MD;  [facility];  Service: Orthopedics;  Laterality: Left;  TOTAL KNEE ARTHROPLASTY Right 04/24/2022   Procedure: TOTAL KNEE ARTHROPLASTY;  Surgeon: Gaynelle Arabian, MD;  [facility];  Service: Orthopedics;  Laterality: Right;   Patient Active Problem List   Diagnosis Date Noted   Osteoarthritis of right knee 04/24/2022   Sore throat 03/20/2022   Strep throat 03/20/2022   COVID-19 03/20/2022   Primary osteoarthritis of left knee 08/15/2021   Double vision  01/05/2021   S/P gastric bypass 04/05/2020   Need for shingles vaccine 01/28/2020   Foreskin problem 08/10/2019   HLD (hyperlipidemia) 08/05/2017   Routine general medical examination at a health care facility 07/13/2016   Advance care planning 07/13/2016   Type 2 diabetes mellitus with diabetic neuropathy, unspecified ([place]) 03/17/2016   Lower urinary tract symptoms (LUTS) 03/16/2016   Dysphagia    Essential hypertension    Hx of colonic polyps    Fatigue 08/04/2014   Rash and nonspecific skin eruption 10/21/2013   Morbid obesity with BMI of 50.0-59.9, adult ([place]) 05/24/2013   History of colonic polyps 04/29/2013   Anxiety 03/10/2013   Pain in joint, shoulder region 02/08/2013   Sinus bradycardia /pauses    Edema 01/31/2012   OA (osteoarthritis) of knee 06/15/2011   Paresthesia 01/08/2011   Achalasia 08/17/2010   Arthritis of knee 01/21/2010   PACEMAKER, St Judes 01/21/2010   Obesity 12/07/2009   OSA (obstructive sleep apnea) 12/07/2009    PCP: Damita Dunnings  REFERRING PROVIDER: Aluisio  REFERRING DIAG: right TKA  THERAPY DIAG:  Stiffness of right knee, not elsewhere classified  Acute pain of right knee  Localized edema  Difficulty in walking, not elsewhere classified  Rationale for Evaluation and Treatment: Rehabilitation  ONSET DATE: 04/24/22  SUBJECTIVE:   SUBJECTIVE STATEMENT: Seen MD this morning and he was pleased   PERTINENT HISTORY: ICD/Pacemaker PAIN:  Are you having pain? Yes: NPRS scale: 0/10 Pain location: right knee Pain description: ache Aggravating factors: bending, walking pain up to 8/10 Relieving factors: rest, ice, pain meds at best pain a 2/10  PRECAUTIONS: ICD/Pacemaker  WEIGHT BEARING RESTRICTIONS: No  FALLS:  Has patient fallen in last 6 months? No  LIVING ENVIRONMENT: Lives with: lives with their family Lives in: House/apartment Stairs: No Has following equipment at home: Environmental consultant - 4 wheeled  OCCUPATION: does work, may travel  and have to walk a lot  PLOF: Independent with household mobility with device and Independent with community mobility with device, was using a SPC, would like to play golf again  PATIENT GOALS: walk without device, go up and down stairs, have no pain, play golf  NEXT MD VISIT:   OBJECTIVE:  PATIENT SURVEYS:  FOTO 14  COGNITION: Overall cognitive status: Within functional limits for tasks assessed     SENSATION: WFL  EDEMA:  Circumferential: left mid patella 50cm, right 59.5cm  POSTURE: rounded shoulders and forward head  PALPATION: Very tender   LOWER EXTREMITY ROM:  AROM/PROM Right AROM eval Right PROM eval Right PROM 05/12/22  Hip flexion     Hip extension     Hip abduction     Hip adduction     Hip internal rotation     Hip external rotation     Knee flexion 80 85 110  Knee extension 35 30 12  Ankle dorsiflexion     Ankle plantarflexion     Ankle inversion     Ankle eversion      (Blank rows = not tested)  LOWER EXTREMITY MMT:  MMT Right eval Left eval  Hip flexion    Hip extension    Hip abduction    Hip adduction    Hip internal rotation    Hip external rotation    Knee flexion 3+   Knee extension 2+   Ankle dorsiflexion    Ankle plantarflexion    Ankle inversion    Ankle eversion     (Blank rows = not tested)  FUNCTIONAL TESTS:  Timed up and go (TUG): 36 seconds  GAIT: Distance walked: 100 feet Assistive device utilized: Walker - 4 wheeled Level of assistance: SBA Comments: step to pattern   TODAY'S TREATMENT:                                                                                                                              DATE:   05/12/22 R knee PROM flex and Ext LAQ RLE 3lb 2x10  RLE hamstring curls green 2x12 S2S 2x12 NuStep L4 x 5 min   Leg press 50lb 2x10, RLE 30lb 2x10 [facility] 05/[facility]22 NuStep L5 x 6 min  R knee PROM S2S 2x10 Leg press 40lb 2x10, RLE TKE 20lb 2x10 LAQ RLE 3lb 2x10  RLE  hamstring curls green 2x12  05/04/22 NuStep L4 x 6 min R knee PROM  RLE LAQ 2lb 2x10 RLE Hamstring curls Red 2x10  S2S x10 x5  SAQ RLE x15 SLR RLE x10     05/01/22 NuStep L3 x 4 min with U and LE. Supine strengthening, HS, SAQ, knee press with heel elevated, SLR-10 reps each  Knee AROM 26-90 Seated strengthening-Knee flexion against red tband, long arc quad 10 each Standing heel raises x 10 reps  PATIENT EDUCATION:  Education details: HEP/POC Person educated: Patient Education method: EConsulting civil engineer DMedia planner Verbal cues, and Handouts Education comprehension: verbalized understanding  HOME EXERCISE PROGRAM: Access Code: [email or web address] URL: [email or web address] Date: 04/27/2022 Prepared by: MLum Babe Exercises - Supine Heel Slide  - 2 x daily - 7 x weekly - 1 sets - 10 reps - 10 hold - Supine Quad Set  - 2 x daily - 7 x weekly - 1 sets - 10 reps - 2 hold - Seated Long Arc Quad  - 2 x daily - 7 x weekly - 1 sets - 10 reps - 2 hold - Seated Heel Slide  - 2 x daily - 7 x weekly - 1 sets - 10 reps - 10 hold  ASSESSMENT:  CLINICAL IMPRESSION: Pt enters doing well. He has progressed increasing his R knee AROM. R knee ext was limited with open chain measurments. Extension appeared to ve visually better with close chain on leg press. Progressed strengthening and ROM of R knee. Compensation remained with sit to stands. Pt did well with all interventions. Good effort throughout session   OBJECTIVE IMPAIRMENTS: Abnormal gait, cardiopulmonary status limiting activity, decreased activity tolerance, decreased balance, decreased mobility, difficulty walking, decreased ROM, decreased strength, increased edema, increased muscle spasms, impaired flexibility, and pain.  REHAB POTENTIAL: Good  CLINICAL DECISION MAKING: Evolving/moderate complexity  EVALUATION COMPLEXITY: Low   GOALS: Goals reviewed with patient? Yes  SHORT TERM GOALS: Target date:  05/10/22 Independent with initial HEP Goal status: Met  2.  Safe with walking and transfers in the clinic Goal status: INITIAL LONG TERM GOALS: Target date: 07/26/22  Independent with advanced HEP Goal status: INITIAL  2.  Decrease pain 50% Goal status: ongoing  3.  Increase knee AROM to 5-115 degrees flexion Goal status: INITIAL  4.  Go up and down stairs step over step Goal status: INITIAL  5.  Increase strength to 4=/5 Goal status: INITIAL  PLAN:  PT FREQUENCY: 3x/week  PT DURATION: 12 weeks  PLANNED INTERVENTIONS: Therapeutic exercises, Therapeutic activity, Neuromuscular re-education, Balance training, Gait training, Patient/Family education, Self Care, Joint mobilization, Joint manipulation, Electrical stimulation, Cryotherapy, Vasopneumatic device, and Manual therapy  PLAN FOR NEXT SESSION: slowly start gym activities, go slow as last time he had a setback with doing too much too soon   Marcelina Morel, DPT 05/12/2022, 11:02 AM

## 2022-05-15 ENCOUNTER — Encounter: Payer: Self-pay | Admitting: Physical Therapy

## 2022-05-15 ENCOUNTER — Ambulatory Visit: Payer: BC Managed Care – PPO | Admitting: Physical Therapy

## 2022-05-15 DIAGNOSIS — R262 Difficulty in walking, not elsewhere classified: Secondary | ICD-10-CM

## 2022-05-15 DIAGNOSIS — R600 Localized edema: Secondary | ICD-10-CM

## 2022-05-15 DIAGNOSIS — M25561 Pain in right knee: Secondary | ICD-10-CM | POA: Diagnosis not present

## 2022-05-15 DIAGNOSIS — M25661 Stiffness of right knee, not elsewhere classified: Secondary | ICD-10-CM

## 2022-05-15 NOTE — Therapy (Signed)
OUTPATIENT PHYSICAL THERAPY LOWER EXTREMITY TREATMENT   Patient Name: Glenn Lyons MRN: [id number] DOB:07-Apr-1956, 66 y.o., male Today's Date: 05/15/2022  END OF SESSION:  PT End of Session - 05/15/22 1432     Visit Number 6    Date for PT Re-Evaluation 07/25/22    PT Start Time 1430    PT Stop Time 1515    PT Time Calculation (min) 45 min    Activity Tolerance Patient tolerated treatment well    Behavior During Therapy [facility] for tasks assessed/performed              Past Medical History:  Diagnosis Date   Achalasia    with prev eval at [facility].  No intervention as of 2012   Arthritis    Aspiration pneumonia (HCC)    Backache, unspecified    Cardiac pacemaker St. Jude    ERI 2008   Depressive disorder, not elsewhere classified    Diabetes ([place])    Hypertension    Morbid obesity ([place])    Obstructive sleep apnea    biapap settign 25-22   Presence of permanent cardiac pacemaker    St. Jude- Dr. Caryl Comes follows -device Check 07-06-14   Sinus bradycardia /pauses    Stricture and stenosis of esophagus    Past Surgical History:  Procedure Laterality Date   BALLOON DILATION N/A 10/06/2015   Procedure: BALLOON DILATION;  Surgeon: Mauri Pole, MD;  Location: [place] ENDOSCOPY;  Service: Endoscopy;  Laterality: N/A;   BIOPSY  05/06/2018   Procedure: BIOPSY;  Surgeon: Rush Landmark Telford Nab., MD;  Location: [place];  Service: Gastroenterology;;   COLONOSCOPY W/ POLYPECTOMY  11/01/2010   COLONOSCOPY WITH PROPOFOL N/A 10/05/2014   Procedure: COLONOSCOPY WITH PROPOFOL;  Surgeon: Gatha Mayer, MD;  [facility];  Service: Endoscopy;  Laterality: N/A;   COLONOSCOPY WITH PROPOFOL N/A 05/06/2018   Procedure: COLONOSCOPY WITH PROPOFOL;  Surgeon: Rush Landmark Telford Nab., MD;  Location: [place];  Service: Gastroenterology;  Laterality: N/A;   ESOPHAGEAL DILATION     ESOPHAGEAL MANOMETRY N/A 08/23/2015   Procedure: ESOPHAGEAL MANOMETRY (EM);  Surgeon: Mauri Pole,  MD;  [facility];  Service: Endoscopy;  Laterality: N/A;   ESOPHAGOGASTRODUODENOSCOPY N/A 10/09/2014   Procedure: ESOPHAGOGASTRODUODENOSCOPY (EGD);  Surgeon: Gatha Mayer, MD;  [facility];  Service: Endoscopy;  Laterality: N/A;   ESOPHAGOGASTRODUODENOSCOPY (EGD) WITH PROPOFOL N/A 08/23/2015   Procedure: ESOPHAGOGASTRODUODENOSCOPY (EGD) WITH PROPOFOL;  Surgeon: Mauri Pole, MD;  [facility];  Service: Endoscopy;  Laterality: N/A;   ESOPHAGOGASTRODUODENOSCOPY (EGD) WITH PROPOFOL N/A 10/06/2015   Procedure: ESOPHAGOGASTRODUODENOSCOPY (EGD) WITH PROPOFOL;  Surgeon: Mauri Pole, MD;  Location: [place] [facility]OSCOPY;  Service: Endoscopy;  Laterality: N/A;  Rigiflex ballon size 41m-35mm size 45 minute proc,need Fluro Gastografin esophagram 2 hrs post EGD    GASTRIC ROUX-EN-Y N/A 04/05/2020   Procedure: LAPAROSCOPIC ROUX-EN-Y GASTRIC BYPASS WITH UPPER ENDOSCOPY;  Surgeon: WGreer Pickerel MD;  [facility];  Service: General;  Laterality: N/A;   PACEMAKER INSERTION     POLYPECTOMY  05/06/2018   Procedure: POLYPECTOMY;  Surgeon: MIrving Copas, MD;  [place]  Service: Gastroenterology;;   PBetsy PriesGENERATOR CHANGEOUT N/A 05/01/2018   Procedure: PPM GENERATOR CHANGEOUT;  Surgeon: KDeboraha Sprang MD;  [place] [facility];  Service: Cardiovascular;  Laterality: N/A;   TOTAL KNEE ARTHROPLASTY Left 08/15/2021   Procedure: TOTAL KNEE ARTHROPLASTY;  Surgeon: AGaynelle Arabian MD;  [facility];  Service: Orthopedics;  Laterality: Left;  TOTAL KNEE ARTHROPLASTY Right 04/24/2022   Procedure: TOTAL KNEE ARTHROPLASTY;  Surgeon: Gaynelle Arabian, MD;  [facility];  Service: Orthopedics;  Laterality: Right;   Patient Active Problem List   Diagnosis Date Noted   Osteoarthritis of right knee 04/24/2022   Sore throat 03/20/2022   Strep throat 03/20/2022   COVID-19 03/20/2022   Primary osteoarthritis of left knee 08/15/2021   Double vision  01/05/2021   S/P gastric bypass 04/05/2020   Need for shingles vaccine 01/28/2020   Foreskin problem 08/10/2019   HLD (hyperlipidemia) 08/05/2017   Routine general medical examination at a health care facility 07/13/2016   Advance care planning 07/13/2016   Type 2 diabetes mellitus with diabetic neuropathy, unspecified ([place]) 03/17/2016   Lower urinary tract symptoms (LUTS) 03/16/2016   Dysphagia    Essential hypertension    Hx of colonic polyps    Fatigue 08/04/2014   Rash and nonspecific skin eruption 10/21/2013   Morbid obesity with BMI of 50.0-59.9, adult ([place]) 05/24/2013   History of colonic polyps 04/29/2013   Anxiety 03/10/2013   Pain in joint, shoulder region 02/08/2013   Sinus bradycardia /pauses    Edema 01/31/2012   OA (osteoarthritis) of knee 06/15/2011   Paresthesia 01/08/2011   Achalasia 08/17/2010   Arthritis of knee 01/21/2010   PACEMAKER, [employer] Judes 01/21/2010   Obesity 12/07/2009   OSA (obstructive sleep apnea) 12/07/2009    PCP: Damita Dunnings  REFERRING PROVIDER: Aluisio  REFERRING DIAG: right TKA  THERAPY DIAG:  Stiffness of right knee, not elsewhere classified  Acute pain of right knee  Difficulty in walking, not elsewhere classified  Localized edema  Rationale for Evaluation and Treatment: Rehabilitation  ONSET DATE: 04/24/22  SUBJECTIVE:   SUBJECTIVE STATEMENT: Sore and tender as pt pointed to distal R patella area  PERTINENT HISTORY: ICD/Pacemaker PAIN:  Are you having pain? Yes: NPRS scale: 5/10 Pain location: right knee Pain description: ache Aggravating factors: bending, walking pain up to 8/10 Relieving factors: rest, ice, pain meds at best pain a 2/10  PRECAUTIONS: ICD/Pacemaker  WEIGHT BEARING RESTRICTIONS: No  FALLS:  Has patient fallen in last 6 months? No  LIVING ENVIRONMENT: Lives with: lives with their family Lives in: House/apartment Stairs: No Has following equipment at home: Environmental consultant - 4 wheeled  OCCUPATION: does  work, may travel and have to walk a lot  PLOF: Independent with household mobility with device and Independent with community mobility with device, was using a SPC, would like to play golf again  PATIENT GOALS: walk without device, go up and down stairs, have no pain, play golf  NEXT MD VISIT:   OBJECTIVE:  PATIENT SURVEYS:  FOTO 14  COGNITION: Overall cognitive status: Within functional limits for tasks assessed     SENSATION: WFL  EDEMA:  Circumferential: left mid patella 50cm, right 59.5cm  POSTURE: rounded shoulders and forward head  PALPATION: Very tender   LOWER EXTREMITY ROM:  AROM/PROM Right AROM eval Right PROM eval Right PROM 05/12/22  Hip flexion     Hip extension     Hip abduction     Hip adduction     Hip internal rotation     Hip external rotation     Knee flexion 80 85 110  Knee extension 35 30 12  Ankle dorsiflexion     Ankle plantarflexion     Ankle inversion     Ankle eversion      (Blank rows = not tested)  LOWER EXTREMITY MMT:  MMT Right eval  Left eval  Hip flexion    Hip extension    Hip abduction    Hip adduction    Hip internal rotation    Hip external rotation    Knee flexion 3+   Knee extension 2+   Ankle dorsiflexion    Ankle plantarflexion    Ankle inversion    Ankle eversion     (Blank rows = not tested)  FUNCTIONAL TESTS:  Timed up and go (TUG): 36 seconds  GAIT: Distance walked: 100 feet Assistive device utilized: Walker - 4 wheeled Level of assistance: SBA Comments: step to pattern   TODAY'S TREATMENT:                                                                                                                              DATE:   05/15/22 NuStep L5 x 6 min LE only Bike L2 x 3 min  Resisted gait 30lb all directions x3 each S2S 2x12 Hamstring Curls 35lb x145, RLE 20lb 2x10 Leg Ext 10lb x15, RLE 5lb 2x10 4in step ups UE x2 x10 each Gait without AD 22f  05/12/22 R knee PROM flex and Ext LAQ RLE  3lb 2x10  RLE hamstring curls green 2x12 S2S 2x12 NuStep L4 x 5 min   Leg press 50lb 2x10, RLE 30lb 2x10 [facility]ait [id number]/ [facility]23 NuStep L5 x 6 min  R knee PROM S2S 2x10 Leg press 40lb 2x10, RLE TKE 20lb 2x10 LAQ RLE 3lb 2x10  RLE hamstring curls green 2x12  05/04/22 NuStep L4 x 6 min R knee PROM  RLE LAQ 2lb 2x10 RLE Hamstring curls Red 2x10  S2S x10 x5  SAQ RLE x15 SLR RLE x10     05/01/22 NuStep L3 x 4 min with U and LE. Supine strengthening, HS, SAQ, knee press with heel elevated, SLR-10 reps each  Knee AROM 26-90 Seated strengthening-Knee flexion against red tband, long arc quad 10 each Standing heel raises x 10 reps  PATIENT EDUCATION:  Education details: HEP/POC Person educated: Patient Education method: ExConsulting civil engineerDeMedia plannerVerbal cues, and Handouts Education comprehension: verbalized understanding  HOME EXERCISE PROGRAM: Access Code: [email or web address] URL: [email or web address] Date: 04/27/2022 Prepared by: MiLum BabeExercises - Supine Heel Slide  - 2 x daily - 7 x weekly - 1 sets - 10 reps - 10 hold - Supine Quad Set  - 2 x daily - 7 x weekly - 1 sets - 10 reps - 2 hold - Seated Long Arc Quad  - 2 x daily - 7 x weekly - 1 sets - 10 reps - 2 hold - Seated Heel Slide  - 2 x daily - 7 x weekly - 1 sets - 10 reps - 10 hold  ASSESSMENT:  CLINICAL IMPRESSION: Pt enters doing well. Session consisted of a progression of LE strength and functional interventions. Compensation remained with sit to stands. Pt did well with all interventions. Good effort throughout session   OBJECTIVE IMPAIRMENTS: Abnormal  gait, cardiopulmonary status limiting activity, decreased activity tolerance, decreased balance, decreased mobility, difficulty walking, decreased ROM, decreased strength, increased edema, increased muscle spasms, impaired flexibility, and pain.   REHAB POTENTIAL: Good  CLINICAL DECISION MAKING: Evolving/moderate complexity  EVALUATION  COMPLEXITY: Low   GOALS: Goals reviewed with patient? Yes  SHORT TERM GOALS: Target date: 05/10/22 Independent with initial HEP Goal status: Met  2.  Safe with walking and transfers in the clinic Goal status: INITIAL LONG TERM GOALS: Target date: 07/26/22  Independent with advanced HEP Goal status: INITIAL  2.  Decrease pain 50% Goal status: ongoing  3.  Increase knee AROM to 5-115 degrees flexion Goal status: INITIAL  4.  Go up and down stairs step over step Goal status: INITIAL  5.  Increase strength to 4=/5 Goal status: INITIAL  PLAN:  PT FREQUENCY: 3x/week  PT DURATION: 12 weeks  PLANNED INTERVENTIONS: Therapeutic exercises, Therapeutic activity, Neuromuscular re-education, Balance training, Gait training, Patient/Family education, Self Care, Joint mobilization, Joint manipulation, Electrical stimulation, Cryotherapy, Vasopneumatic device, and Manual therapy  PLAN FOR NEXT SESSION: slowly start gym activities, go slow as last time he had a setback with doing too much too soon   Marcelina Morel, DPT 05/15/2022, 2:32 PM

## 2022-05-17 ENCOUNTER — Ambulatory Visit: Payer: BC Managed Care – PPO | Admitting: Physical Therapy

## 2022-05-17 ENCOUNTER — Encounter: Payer: Self-pay | Admitting: Physical Therapy

## 2022-05-17 DIAGNOSIS — M25561 Pain in right knee: Secondary | ICD-10-CM | POA: Diagnosis not present

## 2022-05-17 DIAGNOSIS — R262 Difficulty in walking, not elsewhere classified: Secondary | ICD-10-CM

## 2022-05-17 DIAGNOSIS — M25661 Stiffness of right knee, not elsewhere classified: Secondary | ICD-10-CM

## 2022-05-17 DIAGNOSIS — R600 Localized edema: Secondary | ICD-10-CM

## 2022-05-17 NOTE — Therapy (Signed)
OUTPATIENT PHYSICAL THERAPY LOWER EXTREMITY TREATMENT   Patient Name: Glenn Lyons MRN: [id number] DOB:02-01-1956, 66 y.o., male Today's Date: 05/17/2022  END OF SESSION:  PT End of Session - 05/17/22 1430     Visit Number 7    Date for PT Re-Evaluation 07/25/22    PT Start Time 1430    PT Stop Time 1515    PT Time Calculation (min) 45 min    Activity Tolerance Patient tolerated treatment well    Behavior During Therapy [facility] for tasks assessed/performed              Past Medical History:  Diagnosis Date   Achalasia    with prev eval at [facility].  No intervention as of 2012   Arthritis    Aspiration pneumonia (HCC)    Backache, unspecified    Cardiac pacemaker St. Jude    ERI 2008   Depressive disorder, not elsewhere classified    Diabetes ([place])    Hypertension    Morbid obesity ([place])    Obstructive sleep apnea    biapap settign 25-22   Presence of permanent cardiac pacemaker    St. Jude- Dr. Caryl Comes follows -device Check 07-06-14   Sinus bradycardia /pauses    Stricture and stenosis of esophagus    Past Surgical History:  Procedure Laterality Date   BALLOON DILATION N/A 10/06/2015   Procedure: BALLOON DILATION;  Surgeon: Mauri Pole, MD;  Location: [place] ENDOSCOPY;  Service: Endoscopy;  Laterality: N/A;   BIOPSY  05/06/2018   Procedure: BIOPSY;  Surgeon: Rush Landmark Telford Nab., MD;  Location: [place];  Service: Gastroenterology;;   COLONOSCOPY W/ POLYPECTOMY  11/01/2010   COLONOSCOPY WITH PROPOFOL N/A 10/05/2014   Procedure: COLONOSCOPY WITH PROPOFOL;  Surgeon: Gatha Mayer, MD;  [facility];  Service: Endoscopy;  Laterality: N/A;   COLONOSCOPY WITH PROPOFOL N/A 05/06/2018   Procedure: COLONOSCOPY WITH PROPOFOL;  Surgeon: Rush Landmark Telford Nab., MD;  Location: [place];  Service: Gastroenterology;  Laterality: N/A;   ESOPHAGEAL DILATION     ESOPHAGEAL MANOMETRY N/A 08/23/2015   Procedure: ESOPHAGEAL MANOMETRY (EM);  Surgeon: Mauri Pole,  MD;  [facility];  Service: Endoscopy;  Laterality: N/A;   ESOPHAGOGASTRODUODENOSCOPY N/A 10/09/2014   Procedure: ESOPHAGOGASTRODUODENOSCOPY (EGD);  Surgeon: Gatha Mayer, MD;  [facility];  Service: Endoscopy;  Laterality: N/A;   ESOPHAGOGASTRODUODENOSCOPY (EGD) WITH PROPOFOL N/A 08/23/2015   Procedure: ESOPHAGOGASTRODUODENOSCOPY (EGD) WITH PROPOFOL;  Surgeon: Mauri Pole, MD;  [facility];  Service: Endoscopy;  Laterality: N/A;   ESOPHAGOGASTRODUODENOSCOPY (EGD) WITH PROPOFOL N/A 10/06/2015   Procedure: ESOPHAGOGASTRODUODENOSCOPY (EGD) WITH PROPOFOL;  Surgeon: Mauri Pole, MD;  Location: [place] ENDOSCOPY;  Service: Endoscopy;  Laterality: N/A;  Rigiflex ballon size 67m-35mm size 45 minute proc,need Fluro Gastografin esophagram 2 hrs post EGD    GASTRIC ROUX-EN-Y N/A 04/05/2020   Procedure: LAPAROSCOPIC ROUX-EN-Y GASTRIC BYPASS WITH UPPER ENDOSCOPY;  Surgeon: WGreer Pickerel MD;  [facility];  Service: General;  Laterality: N/A;   PACEMAKER INSERTION     POLYPECTOMY  05/06/2018   Procedure: POLYPECTOMY;  Surgeon: MIrving Copas, MD;  [place]  Service: Gastroenterology;;   PBetsy PriesGENERATOR CHANGEOUT N/A 05/01/2018   Procedure: PPM GENERATOR CHANGEOUT;  Surgeon: KDeboraha Sprang MD;  [place];  Service: Cardiovascular;  Laterality: N/A;   TOTAL KNEE ARTHROPLASTY Left 08/15/2021   Procedure: TOTAL KNEE ARTHROPLASTY;  Surgeon: AGaynelle Arabian MD;  [facility];  Service: Orthopedics;  Laterality: Left;  TOTAL KNEE ARTHROPLASTY Right 04/24/2022   Procedure: TOTAL KNEE ARTHROPLASTY;  Surgeon: Gaynelle Arabian, MD;  [facility];  Service: Orthopedics;  Laterality: Right;   Patient Active Problem List   Diagnosis Date Noted   Osteoarthritis of right knee 04/24/2022   Sore throat 03/20/2022   Strep throat 03/20/2022   COVID-19 03/20/2022   Primary osteoarthritis of left knee 08/15/2021   Double vision  01/05/2021   S/P gastric bypass 04/05/2020   Need for shingles vaccine 01/28/2020   Foreskin problem 08/10/2019   HLD (hyperlipidemia) 08/05/2017   Routine general medical examination at a health care facility 07/13/2016   Advance care planning 07/13/2016   Type 2 diabetes mellitus with diabetic neuropathy, unspecified ([place]) 03/17/2016   Lower urinary tract symptoms (LUTS) 03/16/2016   Dysphagia    Essential hypertension    Hx of colonic polyps    Fatigue 08/04/2014   Rash and nonspecific skin eruption 10/21/2013   Morbid obesity with BMI of 50.0-59.9, adult ([place]) 05/24/2013   History of colonic polyps 04/29/2013   Anxiety 03/10/2013   Pain in joint, shoulder region 02/08/2013   Sinus bradycardia /pauses    Edema 01/31/2012   OA (osteoarthritis) of knee 06/15/2011   Paresthesia 01/08/2011   Achalasia 08/17/2010   Arthritis of knee 01/21/2010   PACEMAKER, [employer] Judes 01/21/2010   Obesity 12/07/2009   OSA (obstructive sleep apnea) 12/07/2009    PCP: Damita Dunnings  REFERRING PROVIDER: Aluisio  REFERRING DIAG: right TKA  THERAPY DIAG:  Stiffness of right knee, not elsewhere classified  Acute pain of right knee  Difficulty in walking, not elsewhere classified  Localized edema  Rationale for Evaluation and Treatment: Rehabilitation  ONSET DATE: 04/24/22  SUBJECTIVE:   SUBJECTIVE STATEMENT: "Just tight"  PERTINENT HISTORY: ICD/Pacemaker PAIN:  Are you having pain? Yes: NPRS scale: 4/10 Pain location: right knee Pain description: ache Aggravating factors: bending, walking pain up to 8/10 Relieving factors: rest, ice, pain meds at best pain a 2/10  PRECAUTIONS: ICD/Pacemaker  WEIGHT BEARING RESTRICTIONS: No  FALLS:  Has patient fallen in last 6 months? No  LIVING ENVIRONMENT: Lives with: lives with their family Lives in: House/apartment Stairs: No Has following equipment at home: Environmental consultant - 4 wheeled  OCCUPATION: does work, may travel and have to walk a  lot  PLOF: Independent with household mobility with device and Independent with community mobility with device, was using a SPC, would like to play golf again  PATIENT GOALS: walk without device, go up and down stairs, have no pain, play golf  NEXT MD VISIT:   OBJECTIVE:  PATIENT SURVEYS:  FOTO 14  COGNITION: Overall cognitive status: Within functional limits for tasks assessed     SENSATION: WFL  EDEMA:  Circumferential: left mid patella 50cm, right 59.5cm  POSTURE: rounded shoulders and forward head  PALPATION: Very tender   LOWER EXTREMITY ROM:  AROM/PROM Right AROM eval Right PROM eval Right PROM 05/12/22  Hip flexion     Hip extension     Hip abduction     Hip adduction     Hip internal rotation     Hip external rotation     Knee flexion 80 85 110  Knee extension 35 30 12  Ankle dorsiflexion     Ankle plantarflexion     Ankle inversion     Ankle eversion      (Blank rows = not tested)  LOWER EXTREMITY MMT:  MMT Right eval Left eval  Hip flexion    Hip  extension    Hip abduction    Hip adduction    Hip internal rotation    Hip external rotation    Knee flexion 3+   Knee extension 2+   Ankle dorsiflexion    Ankle plantarflexion    Ankle inversion    Ankle eversion     (Blank rows = not tested)  FUNCTIONAL TESTS:  Timed up and go (TUG): 36 seconds  GAIT: Distance walked: 100 feet Assistive device utilized: Walker - 4 wheeled Level of assistance: SBA Comments: step to pattern   TODAY'S TREATMENT:                                                                                                                              DATE:   05/17/22 NuStep L5 x 4 min LE only Bike L3 x 3 min Leg press 50lb 3x12 Step ups 6in x10 each Hamstring Curls 35lb x15, RLE 20lb 2x15 Leg Ext 10lb x15, RLE 5lb 2x10  05/15/22 NuStep L5 x 6 min LE only Bike L2 x 3 min  Resisted gait 30lb all directions x3 each S2S 2x12 Hamstring Curls 35lb x145, RLE 20lb  2x10 Leg Ext 10lb x15, RLE 5lb 2x10 4in step ups UE x2 x10 each Gait without AD 65f  05/12/22 R knee PROM flex and Ext LAQ RLE 3lb 2x10  RLE hamstring curls green 2x12 S2S 2x12 NuStep L4 x 5 min   Leg press 50lb 2x10, RLE 30lb 2x10 Gait [id number]/ [facility]12[facility]23 NuStep L5 x 6 min  R knee PROM S2S 2x10 Leg press 40lb 2x10, RLE TKE 20lb 2x10 LAQ RLE 3lb 2x10  RLE hamstring curls green 2x12  05/04/22 NuStep L4 x 6 min R knee PROM  RLE LAQ 2lb 2x10 RLE Hamstring curls Red 2x10  S2S x10 x5  SAQ RLE x15 SLR RLE x10     05/01/22 NuStep L3 x 4 min with U and LE. Supine strengthening, HS, SAQ, knee press with heel elevated, SLR-10 reps each  Knee AROM 26-90 Seated strengthening-Knee flexion against red tband, long arc quad 10 each Standing heel raises x 10 reps  PATIENT EDUCATION:  Education details: HEP/POC Person educated: Patient Education method: ExConsulting civil engineerDeMedia plannerVerbal cues, and Handouts Education comprehension: verbalized understanding  HOME EXERCISE PROGRAM: Access Code: [email or web address] URL: [email or web address] Date: 04/27/2022 Prepared by: MiLum BabeExercises - Supine Heel Slide  - 2 x daily - 7 x weekly - 1 sets - 10 reps - 10 hold - Supine Quad Set  - 2 x daily - 7 x weekly - 1 sets - 10 reps - 2 hold - Seated Long Arc Quad  - 2 x daily - 7 x weekly - 1 sets - 10 reps - 2 hold - Seated Heel Slide  - 2 x daily - 7 x weekly - 1 sets - 10 reps - 10 hold  ASSESSMENT:  CLINICAL IMPRESSION: Pt enters doing well, requesting an early dismissal .  Session consisted of a progression of LE strength and functional interventions. RLE weakness present with step ups, compensation noted. Good effort throughout session   OBJECTIVE IMPAIRMENTS: Abnormal gait, cardiopulmonary status limiting activity, decreased activity tolerance, decreased balance, decreased mobility, difficulty walking, decreased ROM, decreased strength, increased edema, increased  muscle spasms, impaired flexibility, and pain.   REHAB POTENTIAL: Good  CLINICAL DECISION MAKING: Evolving/moderate complexity  EVALUATION COMPLEXITY: Low   GOALS: Goals reviewed with patient? Yes  SHORT TERM GOALS: Target date: 05/10/22 Independent with initial HEP Goal status: Met  2.  Safe with walking and transfers in the clinic Goal status: INITIAL LONG TERM GOALS: Target date: 07/26/22  Independent with advanced HEP Goal status: INITIAL  2.  Decrease pain 50% Goal status: ongoing  3.  Increase knee AROM to 5-115 degrees flexion Goal status: progressing  4.  Go up and down stairs step over step Goal status: INITIAL  5.  Increase strength to 4=/5 Goal status: INITIAL  PLAN:  PT FREQUENCY: 3x/week  PT DURATION: 12 weeks  PLANNED INTERVENTIONS: Therapeutic exercises, Therapeutic activity, Neuromuscular re-education, Balance training, Gait training, Patient/Family education, Self Care, Joint mobilization, Joint manipulation, Electrical stimulation, Cryotherapy, Vasopneumatic device, and Manual therapy  PLAN FOR NEXT SESSION: slowly start gym activities, go slow as last time he had a setback with doing too much too soon   Marcelina Morel, DPT 05/17/2022, 2:31 PM

## 2022-05-19 ENCOUNTER — Encounter: Payer: Self-pay | Admitting: Physical Therapy

## 2022-05-19 ENCOUNTER — Ambulatory Visit: Payer: BC Managed Care – PPO | Admitting: Physical Therapy

## 2022-05-19 DIAGNOSIS — R600 Localized edema: Secondary | ICD-10-CM

## 2022-05-19 DIAGNOSIS — M25561 Pain in right knee: Secondary | ICD-10-CM | POA: Diagnosis not present

## 2022-05-19 DIAGNOSIS — R262 Difficulty in walking, not elsewhere classified: Secondary | ICD-10-CM

## 2022-05-19 DIAGNOSIS — M25661 Stiffness of right knee, not elsewhere classified: Secondary | ICD-10-CM

## 2022-05-19 NOTE — Therapy (Signed)
OUTPATIENT PHYSICAL THERAPY LOWER EXTREMITY TREATMENT   Patient Name: Glenn Lyons MRN: [id number] DOB:11/01/1955, 66 y.o., male Today's Date: 05/19/2022  END OF SESSION:  PT End of Session - 05/19/22 1058     Visit Number 8    Date for PT Re-Evaluation 07/25/22    PT Start Time 1100    PT Stop Time 1145    PT Time Calculation (min) 45 min    Activity Tolerance Patient tolerated treatment well    Behavior During Therapy [facility] for tasks assessed/performed              Past Medical History:  Diagnosis Date   Achalasia    with prev eval at [facility].  No intervention as of 2012   Arthritis    Aspiration pneumonia (HCC)    Backache, unspecified    Cardiac pacemaker St. Jude    ERI 2008   Depressive disorder, not elsewhere classified    Diabetes ([place])    Hypertension    Morbid obesity ([place])    Obstructive sleep apnea    biapap settign 25-22   Presence of permanent cardiac pacemaker    St. Jude- Dr. Caryl Comes follows -device Check 07-06-14   Sinus bradycardia /pauses    Stricture and stenosis of esophagus    Past Surgical History:  Procedure Laterality Date   BALLOON DILATION N/A 10/06/2015   Procedure: BALLOON DILATION;  Surgeon: Mauri Pole, MD;  Location: [place] [facility]OSCOPY;  Service: Endoscopy;  Laterality: N/A;   BIOPSY  05/06/2018   Procedure: BIOPSY;  Surgeon: Rush Landmark Telford Nab., MD;  Location: [place];  Service: Gastroenterology;;   COLONOSCOPY W/ POLYPECTOMY  11/01/2010   COLONOSCOPY WITH PROPOFOL N/A 10/05/2014   Procedure: COLONOSCOPY WITH PROPOFOL;  Surgeon: Gatha Mayer, MD;  [facility];  Service: Endoscopy;  Laterality: N/A;   COLONOSCOPY WITH PROPOFOL N/A 05/06/2018   Procedure: COLONOSCOPY WITH PROPOFOL;  Surgeon: Rush Landmark Telford Nab., MD;  Location: [place];  Service: Gastroenterology;  Laterality: N/A;   ESOPHAGEAL DILATION     ESOPHAGEAL MANOMETRY N/A 08/23/2015   Procedure: ESOPHAGEAL MANOMETRY (EM);  Surgeon: Mauri Pole,  MD;  [facility];  Service: Endoscopy;  Laterality: N/A;   ESOPHAGOGASTRODUODENOSCOPY N/A 10/09/2014   Procedure: ESOPHAGOGASTRODUODENOSCOPY (EGD);  Surgeon: Gatha Mayer, MD;  [facility]: [facility];  Service: Endoscopy;  Laterality: N/A;   ESOPHAGOGASTRODUODENOSCOPY (EGD) WITH PROPOFOL N/A 08/23/2015   Procedure: ESOPHAGOGASTRODUODENOSCOPY (EGD) WITH PROPOFOL;  Surgeon: Mauri Pole, MD;  [facility];  Service: Endoscopy;  Laterality: N/A;   ESOPHAGOGASTRODUODENOSCOPY (EGD) WITH PROPOFOL N/A 10/06/2015   Procedure: ESOPHAGOGASTRODUODENOSCOPY (EGD) WITH PROPOFOL;  Surgeon: Mauri Pole, MD;  Location: [place] [facility];  Service: Endoscopy;  Laterality: N/A;  Rigiflex ballon size 23m-35mm size 45 minute proc,need Fluro Gastografin esophagram 2 hrs post EGD    GASTRIC ROUX-EN-Y N/A 04/05/2020   Procedure: LAPAROSCOPIC ROUX-EN-Y GASTRIC BYPASS WITH UPPER ENDOSCOPY;  Surgeon: WGreer Pickerel MD;  [facility];  Service: General;  Laterality: N/A;   PACEMAKER INSERTION     POLYPECTOMY  05/06/2018   Procedure: POLYPECTOMY;  Surgeon: MIrving Copas, MD;  [place]  Service: Gastroenterology;;   PBetsy PriesGENERATOR CHANGEOUT N/A 05/01/2018   Procedure: PPM GENERATOR CHANGEOUT;  Surgeon: KDeboraha Sprang MD;  [place];  Service: Cardiovascular;  Laterality: N/A;   TOTAL KNEE ARTHROPLASTY Left 08/15/2021   Procedure: TOTAL KNEE ARTHROPLASTY;  Surgeon: AGaynelle Arabian MD;  [facility];  Service: Orthopedics;  Laterality: Left;  TOTAL KNEE ARTHROPLASTY Right 04/24/2022   Procedure: TOTAL KNEE ARTHROPLASTY;  Surgeon: Gaynelle Arabian, MD;  [facility];  Service: Orthopedics;  Laterality: Right;   Patient Active Problem List   Diagnosis Date Noted   Osteoarthritis of right knee 04/24/2022   Sore throat 03/20/2022   Strep throat 03/20/2022   COVID-19 03/20/2022   Primary osteoarthritis of left knee 08/15/2021   Double vision  01/05/2021   S/P gastric bypass 04/05/2020   Need for shingles vaccine 01/28/2020   Foreskin problem 08/10/2019   HLD (hyperlipidemia) 08/05/2017   Routine general medical examination at a health care facility 07/13/2016   Advance care planning 07/13/2016   Type 2 diabetes mellitus with diabetic neuropathy, unspecified ([place]) 03/17/2016   Lower urinary tract symptoms (LUTS) 03/16/2016   Dysphagia    Essential hypertension    Hx of colonic polyps    Fatigue 08/04/2014   Rash and nonspecific skin eruption 10/21/2013   Morbid obesity with BMI of 50.0-59.9, adult ([place]) 05/24/2013   History of colonic polyps 04/29/2013   Anxiety 03/10/2013   Pain in joint, shoulder region 02/08/2013   Sinus bradycardia /pauses    Edema 01/31/2012   OA (osteoarthritis) of knee 06/15/2011   Paresthesia 01/08/2011   Achalasia 08/17/2010   Arthritis of knee 01/21/2010   PACEMAKER, St Judes 01/21/2010   Obesity 12/07/2009   OSA (obstructive sleep apnea) 12/07/2009    PCP: Damita Dunnings  REFERRING PROVIDER: Aluisio  REFERRING DIAG: right TKA  THERAPY DIAG:  Stiffness of right knee, not elsewhere classified  Acute pain of right knee  Difficulty in walking, not elsewhere classified  Localized edema  Rationale for Evaluation and Treatment: Rehabilitation  ONSET DATE: 04/24/22  SUBJECTIVE:   SUBJECTIVE STATEMENT: "I feel great" Slept last night first time in the bed in three and a half weeks  PERTINENT HISTORY: ICD/Pacemaker PAIN:  Are you having pain? Yes: NPRS scale: 2/10 Pain location: right knee Pain description: ache Aggravating factors: bending, walking pain up to 8/10 Relieving factors: rest, ice, pain meds at best pain a 2/10  PRECAUTIONS: ICD/Pacemaker  WEIGHT BEARING RESTRICTIONS: No  FALLS:  Has patient fallen in last 6 months? No  LIVING ENVIRONMENT: Lives with: lives with their family Lives in: House/apartment Stairs: No Has following equipment at home: Environmental consultant - 4  wheeled  OCCUPATION: does work, may travel and have to walk a lot  PLOF: Independent with household mobility with device and Independent with community mobility with device, was using a SPC, would like to play golf again  PATIENT GOALS: walk without device, go up and down stairs, have no pain, play golf  NEXT MD VISIT:   OBJECTIVE:  PATIENT SURVEYS:  FOTO 14  COGNITION: Overall cognitive status: Within functional limits for tasks assessed     SENSATION: WFL  EDEMA:  Circumferential: left mid patella 50cm, right 59.5cm  POSTURE: rounded shoulders and forward head  PALPATION: Very tender   LOWER EXTREMITY ROM:  AROM/PROM Right AROM eval Right PROM eval Right AROM 05/12/22 R knee AROM  Hip flexion      Hip extension      Hip abduction      Hip adduction      Hip internal rotation      Hip external rotation      Knee flexion 80 85 110 119  Knee extension 35 _0 Ankle dorsiflexion      Ankle plantarflexion      Ankle inversion  Ankle eversion       (Blank rows = not tested)  LOWER EXTREMITY MMT:  MMT Right eval Left eval  Hip flexion    Hip extension    Hip abduction    Hip adduction    Hip internal rotation    Hip external rotation    Knee flexion 3+   Knee extension 2+   Ankle dorsiflexion    Ankle plantarflexion    Ankle inversion    Ankle eversion     (Blank rows = not tested)  FUNCTIONAL TESTS:  Timed up and go (TUG): 36 seconds  GAIT: Distance walked: 100 feet Assistive device utilized: Walker - 4 wheeled Level of assistance: SBA Comments: step to pattern   TODAY'S TREATMENT:                                                                                                                              DATE:   05/19/22 NuStep L5 x 6 min  Leg press 70lb 2x10, RLE 30lb 2x10 6in step ups x10 each 40lb resisted gait x4 each S2S w/ OHP yellow ball 2x10 Hamstring Curls 45lb x15, RLE 25lb 2x15 Leg Ext 15lb x15, RLE 10lb  3x10  05/17/22 NuStep L5 x 4 min LE only Bike L3 x 3 min Leg press 50lb 3x12 Step ups 6in x10 each Hamstring Curls 35lb x15, RLE 20lb 2x15 Leg Ext 10lb x15, RLE 5lb 2x10  05/15/22 NuStep L5 x 6 min LE only Bike L2 x 3 min  Resisted gait 30lb all directions x3 each S2S 2x12 Hamstring Curls 35lb x145, RLE 20lb 2x10 Leg Ext 10lb x15, RLE 5lb 2x10 4in step ups UE x2 x10 each Gait without AD 22f  05/12/22 R knee PROM flex and Ext LAQ RLE 3lb 2x10  RLE hamstring curls green 2x12 S2S 2x12 NuStep L4 x 5 min   Leg press 50lb 2x10, RLE 30lb 2x10 [facility]ait [id number]/ [facility]  PATIENT EDUCATION:  Education details: HEP/POC Person educated: Patient Education method: ExConsulting civil engineerDeMedia plannerVerbal cues, and Handouts Education comprehension: verbalized understanding  HOME EXERCISE PROGRAM: Access Code: [email or web address] URL: [email or web address] Date: 04/27/2022 Prepared by: MiLum BabeExercises - Supine Heel Slide  - 2 x daily - 7 x weekly - 1 sets - 10 reps - 10 hold - Supine Quad Set  - 2 x daily - 7 x weekly - 1 sets - 10 reps - 2 hold - Seated Long Arc Quad  - 2 x daily - 7 x weekly - 1 sets - 10 reps - 2 hold - Seated Heel Slide  - 2 x daily - 7 x weekly - 1 sets - 10 reps - 10 hold  ASSESSMENT:  CLINICAL IMPRESSION: Pt enters doing well ambulating without AD. Pt has progressed increasing his R knee AROM meeting goal.  Some functional RLE weakness remains with step ups. Slight compensation present with sit to stand. No reports fo pain throughout session. OBJECTIVE  IMPAIRMENTS: Abnormal gait, cardiopulmonary status limiting activity, decreased activity tolerance, decreased balance, decreased mobility, difficulty walking, decreased ROM, decreased strength, increased edema, increased muscle spasms, impaired flexibility, and pain.   REHAB POTENTIAL: Good  CLINICAL DECISION MAKING: Evolving/moderate complexity  EVALUATION COMPLEXITY: Low   GOALS: Goals  reviewed with patient? Yes  SHORT TERM GOALS: Target date: 05/10/22 Independent with initial HEP Goal status: Met  2.  Safe with walking and transfers in the clinic Goal status: INITIAL LONG TERM GOALS: Target date: 07/26/22  Independent with advanced HEP Goal status: INITIAL  2.  Decrease pain 50% Goal status: ongoing  3.  Increase knee AROM to 5-115 degrees flexion Goal status: Met 05/19/22  4.  Go up and down stairs step over step Goal status: INITIAL  5.  Increase strength to 4=/5 Goal status: INITIAL  PLAN:  PT FREQUENCY: 3x/week  PT DURATION: 12 weeks  PLANNED INTERVENTIONS: Therapeutic exercises, Therapeutic activity, Neuromuscular re-education, Balance training, Gait training, Patient/Family education, Self Care, Joint mobilization, Joint manipulation, Electrical stimulation, Cryotherapy, Vasopneumatic device, and Manual therapy  PLAN FOR NEXT SESSION: slowly start gym activities, go slow as last time he had a setback with doing too much too soon   Cheri Fowler  05/19/2022, 10:58 AM

## 2022-05-23 ENCOUNTER — Ambulatory Visit: Payer: BC Managed Care – PPO | Admitting: Physical Therapy

## 2022-05-25 ENCOUNTER — Ambulatory Visit: Payer: BC Managed Care – PPO | Admitting: Physical Therapy

## 2022-05-26 ENCOUNTER — Encounter: Payer: Self-pay | Admitting: Physical Therapy

## 2022-05-26 ENCOUNTER — Ambulatory Visit: Payer: BC Managed Care – PPO | Admitting: Physical Therapy

## 2022-05-26 ENCOUNTER — Telehealth: Payer: Self-pay | Admitting: Family Medicine

## 2022-05-26 DIAGNOSIS — M25661 Stiffness of right knee, not elsewhere classified: Secondary | ICD-10-CM

## 2022-05-26 DIAGNOSIS — R262 Difficulty in walking, not elsewhere classified: Secondary | ICD-10-CM

## 2022-05-26 DIAGNOSIS — M25561 Pain in right knee: Secondary | ICD-10-CM

## 2022-05-26 DIAGNOSIS — R600 Localized edema: Secondary | ICD-10-CM

## 2022-05-26 MED ORDER — AMOXICILLIN 500 MG PO CAPS: 500.0000 mg | ORAL_CAPSULE | Freq: Two times a day (BID) | ORAL | 0 refills | Status: AC

## 2022-05-26 NOTE — Telephone Encounter (Signed)
Patient called in and stated that his wife was diagnosed with Strep. He has been exposed to is and was wanting to know if something could be called in for him. Please advise. Thank you!

## 2022-05-26 NOTE — Telephone Encounter (Signed)
See my chart message

## 2022-05-26 NOTE — Therapy (Signed)
OUTPATIENT PHYSICAL THERAPY LOWER EXTREMITY TREATMENT   Patient Name: Glenn Lyons MRN: [id number] DOB:02-12-1956, 66 y.o., male Today's Date: 05/26/2022  END OF SESSION:  PT End of Session - 05/26/22 1020     Visit Number 9    Date for PT Re-Evaluation 07/25/22    PT Start Time 1018    PT Stop Time 1100    PT Time Calculation (min) 42 min    Activity Tolerance Patient tolerated treatment well    Behavior During Therapy WFL for tasks assessed/performed              Past Medical History:  Diagnosis Date   Achalasia    with prev eval at [facility].  No intervention as of 2012   Arthritis    Aspiration pneumonia (HCC)    Backache, unspecified    Cardiac pacemaker St. Jude    ERI 2008   Depressive disorder, not elsewhere classified    Diabetes ([place])    Hypertension    Morbid obesity ([place])    Obstructive sleep apnea    biapap settign 25-22   Presence of permanent cardiac pacemaker    St. Jude- Dr. Caryl Comes follows -device Check 07-06-14   Sinus bradycardia /pauses    Stricture and stenosis of esophagus    Past Surgical History:  Procedure Laterality Date   BALLOON DILATION N/A 10/06/2015   Procedure: BALLOON DILATION;  Surgeon: Mauri Pole, MD;  Location: [place] [facility]COPY;  Service: Endoscopy;  Laterality: N/A;   BIOPSY  05/06/2018   Procedure: BIOPSY;  Surgeon: Rush Landmark Telford Nab., MD;  Location: [place];  Service: Gastroenterology;;   COLONOSCOPY W/ POLYPECTOMY  11/01/2010   COLONOSCOPY WITH PROPOFOL N/A 10/05/2014   Procedure: COLONOSCOPY WITH PROPOFOL;  Surgeon: Gatha Mayer, MD;  [facility];  Service: Endoscopy;  Laterality: N/A;   COLONOSCOPY WITH PROPOFOL N/A 05/06/2018   Procedure: COLONOSCOPY WITH PROPOFOL;  Surgeon: Rush Landmark Telford Nab., MD;  Location: [place];  Service: Gastroenterology;  Laterality: N/A;   ESOPHAGEAL DILATION     ESOPHAGEAL MANOMETRY N/A 08/23/2015   Procedure: ESOPHAGEAL MANOMETRY (EM);  Surgeon: Mauri Pole,  MD;  [facility];  Service: Endoscopy;  Laterality: N/A;   ESOPHAGOGASTRODUODENOSCOPY N/A 10/09/2014   Procedure: ESOPHAGOGASTRODUODENOSCOPY (EGD);  Surgeon: Gatha Mayer, MD;  [facility];  Service: Endoscopy;  Laterality: N/A;   ESOPHAGOGASTRODUODENOSCOPY (EGD) WITH PROPOFOL N/A 08/23/2015   Procedure: ESOPHAGOGASTRODUODENOSCOPY (EGD) WITH PROPOFOL;  Surgeon: Mauri Pole, MD;  [facility];  Service: Endoscopy;  Laterality: N/A;   ESOPHAGOGASTRODUODENOSCOPY (EGD) WITH PROPOFOL N/A 10/06/2015   Procedure: ESOPHAGOGASTRODUODENOSCOPY (EGD) WITH PROPOFOL;  Surgeon: Mauri Pole, MD;  Location: [place] [facility];  Service: Endoscopy;  Laterality: N/A;  Rigiflex ballon size 46m-35mm size 45 minute proc,need Fluro Gastografin esophagram 2 hrs post EGD    GASTRIC ROUX-EN-Y N/A 04/05/2020   Procedure: LAPAROSCOPIC ROUX-EN-Y GASTRIC BYPASS WITH UPPER ENDOSCOPY;  Surgeon: WGreer Pickerel MD;  [facility];  Service: General;  Laterality: N/A;   PACEMAKER INSERTION     POLYPECTOMY  05/06/2018   Procedure: POLYPECTOMY;  Surgeon: MIrving Copas, MD;  [place]  Service: Gastroenterology;;   PBetsy PriesGENERATOR CHANGEOUT N/A 05/01/2018   Procedure: PPM GENERATOR CHANGEOUT;  Surgeon: KDeboraha Sprang MD;  [place];  Service: Cardiovascular;  Laterality: N/A;   TOTAL KNEE ARTHROPLASTY Left 08/15/2021   Procedure: TOTAL KNEE ARTHROPLASTY;  Surgeon: AGaynelle Arabian MD;  [facility];  Service: Orthopedics;  Laterality: Left;  TOTAL KNEE ARTHROPLASTY Right 04/24/2022   Procedure: TOTAL KNEE ARTHROPLASTY;  Surgeon: Gaynelle Arabian, MD;  [facility];  Service: Orthopedics;  Laterality: Right;   Patient Active Problem List   Diagnosis Date Noted   Osteoarthritis of right knee 04/24/2022   Sore throat 03/20/2022   Strep throat 03/20/2022   COVID-19 03/20/2022   Primary osteoarthritis of left knee 08/15/2021   Double vision  01/05/2021   S/P gastric bypass 04/05/2020   Need for shingles vaccine 01/28/2020   Foreskin problem 08/10/2019   HLD (hyperlipidemia) 08/05/2017   Routine general medical examination at a health care facility 07/13/2016   Advance care planning 07/13/2016   Type 2 diabetes mellitus with diabetic neuropathy, unspecified ([place]) 03/17/2016   Lower urinary tract symptoms (LUTS) 03/16/2016   Dysphagia    Essential hypertension    Hx of colonic polyps    Fatigue 08/04/2014   Rash and nonspecific skin eruption 10/21/2013   Morbid obesity with BMI of 50.0-59.9, adult ([place]) 05/24/2013   History of colonic polyps 04/29/2013   Anxiety 03/10/2013   Pain in joint, shoulder region 02/08/2013   Sinus bradycardia /pauses    Edema 01/31/2012   OA (osteoarthritis) of knee 06/15/2011   Paresthesia 01/08/2011   Achalasia 08/17/2010   Arthritis of knee 01/21/2010   PACEMAKER, St Judes 01/21/2010   Obesity 12/07/2009   OSA (obstructive sleep apnea) 12/07/2009    PCP: Damita Dunnings  REFERRING PROVIDER: Aluisio  REFERRING DIAG: right TKA  THERAPY DIAG:  Stiffness of right knee, not elsewhere classified  Acute pain of right knee  Difficulty in walking, not elsewhere classified  Localized edema  Rationale for Evaluation and Treatment: Rehabilitation  ONSET DATE: 04/24/22  SUBJECTIVE:   SUBJECTIVE STATEMENT: PT enters with sleeve on R knee, reporting R quad feels weak  PERTINENT HISTORY: ICD/Pacemaker PAIN:  Are you having pain? Yes: NPRS scale: 6/10 Pain location: right knee Pain description: ache Aggravating factors: bending, walking pain up to 8/10 Relieving factors: rest, ice, pain meds at best pain a 2/10  PRECAUTIONS: ICD/Pacemaker  WEIGHT BEARING RESTRICTIONS: No  FALLS:  Has patient fallen in last 6 months? No  LIVING ENVIRONMENT: Lives with: lives with their family Lives in: House/apartment Stairs: No Has following equipment at home: Environmental consultant - 4 wheeled  OCCUPATION:  does work, may travel and have to walk a lot  PLOF: Independent with household mobility with device and Independent with community mobility with device, was using a SPC, would like to play golf again  PATIENT GOALS: walk without device, go up and down stairs, have no pain, play golf  NEXT MD VISIT:   OBJECTIVE:  PATIENT SURVEYS:  FOTO 14  COGNITION: Overall cognitive status: Within functional limits for tasks assessed     SENSATION: WFL  EDEMA:  Circumferential: left mid patella 50cm, right 59.5cm  POSTURE: rounded shoulders and forward head  PALPATION: Very tender   LOWER EXTREMITY ROM:  AROM/PROM Right AROM eval Right PROM eval Right AROM 05/12/22 R knee AROM  Hip flexion      Hip extension      Hip abduction      Hip adduction      Hip internal rotation      Hip external rotation      Knee flexion 80 85 110 119  Knee extension 35 _0 Ankle dorsiflexion      Ankle plantarflexion      Ankle inversion      Ankle eversion       (  Blank rows = not tested)  LOWER EXTREMITY MMT:  MMT Right eval Left eval  Hip flexion    Hip extension    Hip abduction    Hip adduction    Hip internal rotation    Hip external rotation    Knee flexion 3+   Knee extension 2+   Ankle dorsiflexion    Ankle plantarflexion    Ankle inversion    Ankle eversion     (Blank rows = not tested)  FUNCTIONAL TESTS:  Timed up and go (TUG): 36 seconds  GAIT: Distance walked: 100 feet Assistive device utilized: Walker - 4 wheeled Level of assistance: SBA Comments: step to pattern   TODAY'S TREATMENT:                                                                                                                              DATE:   05/26/22 Bike L2.5 x 6 min  Resisted gait 40lb x5 all directions  6 in step ups x10 each   Leg press 70lb 2x12, RLE 30lb 2x12 Leg Ext RLE 15lb 2x10 Hamstring curls RLE 25lb 2x10 S2S holding 7lb dumbbell 2x10  05/19/22 NuStep L5 x 6 min   Leg press 70lb 2x10, RLE 30lb 2x10 6in step ups x10 each 40lb resisted gait x4 each S2S w/ OHP yellow ball 2x10 Hamstring Curls 45lb x15, RLE 25lb 2x15 Leg Ext 15lb x15, RLE 10lb 3x10  05/17/22 NuStep L5 x 4 min LE only Bike L3 x 3 min Leg press 50lb 3x12 Step ups 6in x10 each Hamstring Curls 35lb x15, RLE 20lb 2x15 Leg Ext 10lb x15, RLE 5lb 2x10  05/15/22 NuStep L5 x 6 min LE only Bike L2 x 3 min  Resisted gait 30lb all directions x3 each S2S 2x12 Hamstring Curls 35lb x145, RLE 20lb 2x10 Leg Ext 10lb x15, RLE 5lb 2x10 4in step ups UE x2 x10 each Gait without AD 50f  05/12/22 R knee PROM flex and Ext LAQ RLE 3lb 2x10  RLE hamstring curls green 2x12 S2S 2x12 NuStep L4 x 5 min   Leg press 50lb 2x10, RLE 30lb 2x10 Gait [id number]/ [facility]  PATIENT EDUCATION:  Education details: HEP/POC Person educated: Patient Education method: ExConsulting civil engineerDeMedia plannerVerbal cues, and Handouts Education comprehension: verbalized understanding  HOME EXERCISE PROGRAM: Access Code: [email or web address] URL: [email or web address] Date: 04/27/2022 Prepared by: MiLum BabeExercises - Supine Heel Slide  - 2 x daily - 7 x weekly - 1 sets - 10 reps - 10 hold - Supine Quad Set  - 2 x daily - 7 x weekly - 1 sets - 10 reps - 2 hold - Seated Long Arc Quad  - 2 x daily - 7 x weekly - 1 sets - 10 reps - 2 hold - Seated Heel Slide  - 2 x daily - 7 x weekly - 1 sets - 10 reps - 10 hold  ASSESSMENT:  CLINICAL IMPRESSION: Pt enters doing  well ambulating without AD.  Some functional RLE weakness remains with step ups and sit to stands. Increase resistance tolerated with resisted gait.  No reports of pain throughout session. OBJECTIVE IMPAIRMENTS: Abnormal gait, cardiopulmonary status limiting activity, decreased activity tolerance, decreased balance, decreased mobility, difficulty walking, decreased ROM, decreased strength, increased edema, increased muscle spasms, impaired flexibility,  and pain.   REHAB POTENTIAL: Good  CLINICAL DECISION MAKING: Evolving/moderate complexity  EVALUATION COMPLEXITY: Low   GOALS: Goals reviewed with patient? Yes  SHORT TERM GOALS: Target date: 05/10/22 Independent with initial HEP Goal status: Met  2.  Safe with walking and transfers in the clinic Goal status: INITIAL LONG TERM GOALS: Target date: 07/26/22  Independent with advanced HEP Goal status: INITIAL  2.  Decrease pain 50% Goal status: ongoing  3.  Increase knee AROM to 5-115 degrees flexion Goal status: Met 05/19/22  4.  Go up and down stairs step over step Goal status: INITIAL  5.  Increase strength to 4=/5 Goal status: INITIAL  PLAN:  PT FREQUENCY: 3x/week  PT DURATION: 12 weeks  PLANNED INTERVENTIONS: Therapeutic exercises, Therapeutic activity, Neuromuscular re-education, Balance training, Gait training, Patient/Family education, Self Care, Joint mobilization, Joint manipulation, Electrical stimulation, Cryotherapy, Vasopneumatic device, and Manual therapy  PLAN FOR NEXT SESSION: slowly start gym activities, go slow as last time he had a setback with doing too much too soon   Cheri Fowler  05/26/2022, 10:20 AM

## 2022-05-26 NOTE — Addendum Note (Signed)
Addended by: Tonia Ghent on: 05/26/2022 05:10 PM   Modules accepted: Orders

## 2022-05-30 ENCOUNTER — Ambulatory Visit: Payer: BC Managed Care – PPO | Admitting: Physical Therapy

## 2022-05-31 NOTE — Progress Notes (Signed)
Remote pacemaker transmission.   

## 2022-06-01 ENCOUNTER — Ambulatory Visit: Payer: BC Managed Care – PPO | Admitting: Physical Therapy

## 2022-06-05 ENCOUNTER — Ambulatory Visit: Payer: BC Managed Care – PPO | Admitting: Physical Therapy

## 2022-06-06 ENCOUNTER — Ambulatory Visit: Payer: BC Managed Care – PPO | Admitting: Family Medicine

## 2022-06-07 ENCOUNTER — Ambulatory Visit: Payer: BC Managed Care – PPO | Admitting: Physical Therapy

## 2022-06-08 ENCOUNTER — Ambulatory Visit: Payer: BC Managed Care – PPO | Admitting: Family Medicine

## 2022-06-08 ENCOUNTER — Encounter: Payer: Self-pay | Admitting: Family Medicine

## 2022-06-08 VITALS — BP 140/76 | HR 91 | Temp 97.8°F | Ht 72.0 in | Wt 308.0 lb

## 2022-06-08 DIAGNOSIS — R4586 Emotional lability: Secondary | ICD-10-CM | POA: Diagnosis not present

## 2022-06-08 DIAGNOSIS — J069 Acute upper respiratory infection, unspecified: Secondary | ICD-10-CM | POA: Diagnosis not present

## 2022-06-08 MED ORDER — CITALOPRAM HYDROBROMIDE 20 MG PO TABS: 20.0000 mg | ORAL_TABLET | Freq: Every day | ORAL | 1 refills | Status: DC

## 2022-06-08 MED ORDER — AMOXICILLIN-POT CLAVULANATE 875-125 MG PO TABS: 1.0000 | ORAL_TABLET | Freq: Two times a day (BID) | ORAL | 0 refills | Status: DC

## 2022-06-08 MED ORDER — GABAPENTIN 300 MG PO CAPS: ORAL_CAPSULE | ORAL | Status: DC

## 2022-06-08 NOTE — Patient Instructions (Addendum)
Restart citalopram.  Update me in about 3-4 weeks by phone or mychart, sooner if needed.   If you have any more sinus pain, then start augmentin. Take care.  Glad to see you.

## 2022-06-08 NOTE — Progress Notes (Signed)
duration of symptoms: about 2 weeks, initially with some vertigo.   rhinorrhea: yes, clear congestion: yes ear pain: no sore throat:no myalgias:no Recently nausea.  Some sinus pressure.  Chest congestion and cough.  Some episodic dizziness.   Chills: yes, episodic  Insomnia recently, episodic.  Patient has been taking tylenol and nyquil for sx. Fatigue over the last 2 weeks.   He is done with amoxil in the meantime.   He feels better since this AM.    Sugar has been 80 to low 100s in the AMs.   Parking form done for patient.  H/o TKA x2.  He is off opiates in the meantime.  He is taking gabapentin.  Unclear if any of that contibutes to any of the above.    H/o dysphagia but not at the point of needing intervention per patient report, he'll update me as needed.    Mood d/w pt.  His schedule and work have been disrupted.  That likely contributes to his lower mood, d/w pt.  No SI/HI.    Meds, vitals, and allergies reviewed.  ROS: Per HPI unless specifically indicated in ROS section   GEN: nad, alert and oriented HEENT: mucous membranes moist, TM w/o erythema, nasal epithelium injected, OP with cobblestoning NECK: supple w/o LA CV: rrr. PULM: ctab, no inc wob ABD: soft, +bs EXT: no edema Sinuses not ttp.

## 2022-06-09 ENCOUNTER — Ambulatory Visit: Payer: BC Managed Care – PPO | Admitting: Family Medicine

## 2022-06-11 DIAGNOSIS — J069 Acute upper respiratory infection, unspecified: Secondary | ICD-10-CM | POA: Insufficient documentation

## 2022-06-11 NOTE — Assessment & Plan Note (Signed)
Okay for outpatient follow-up.  More sinus pressure/pain and start Augmentin and update [place] as needed.  Supportive care in the meantime.

## 2022-06-11 NOTE — Assessment & Plan Note (Addendum)
Mood change d/w pt. significant other people noted with surgical history.  He is going to try to wean off of gabapentin.  Okay for outpatient follow-up.  Discussed restart citalopram to see if that would help with mood.  He will update me as needed.  Okay for outpatient follow-up.

## 2022-06-12 ENCOUNTER — Ambulatory Visit: Payer: BC Managed Care – PPO | Admitting: Physical Therapy

## 2022-06-14 ENCOUNTER — Ambulatory Visit: Payer: BC Managed Care – PPO | Admitting: Physical Therapy

## 2022-06-19 NOTE — Therapy (Incomplete)
OUTPATIENT PHYSICAL THERAPY LOWER EXTREMITY TREATMENT   Patient Name: Glenn Lyons MRN: [id number] DOB:02-12-1956, 67 y.o., male Today's Date: 05/26/2022  END OF SESSION:  PT End of Session - 05/26/22 1020     Visit Number 9    Date for PT Re-Evaluation 07/25/22    PT Start Time 1018    PT Stop Time 1100    PT Time Calculation (min) 42 min    Activity Tolerance Patient tolerated treatment well    Behavior During Therapy WFL for tasks assessed/performed              Past Medical History:  Diagnosis Date   Achalasia    with prev eval at [facility].  No intervention as of 2012   Arthritis    Aspiration pneumonia (HCC)    Backache, unspecified    Cardiac pacemaker St. Jude    ERI 2008   Depressive disorder, not elsewhere classified    Diabetes ([place])    Hypertension    Morbid obesity ([place])    Obstructive sleep apnea    biapap settign 25-22   Presence of permanent cardiac pacemaker    St. Jude- Dr. Caryl Comes follows -device Check 07-06-14   Sinus bradycardia /pauses    Stricture and stenosis of esophagus    Past Surgical History:  Procedure Laterality Date   BALLOON DILATION N/A 10/06/2015   Procedure: BALLOON DILATION;  Surgeon: Mauri Pole, MD;  Location: [place] ENDOSCOPY;  Service: Endoscopy;  Laterality: N/A;   BIOPSY  05/06/2018   Procedure: BIOPSY;  Surgeon: Rush Landmark Telford Nab., MD;  Location: [place];  Service: Gastroenterology;;   COLONOSCOPY W/ POLYPECTOMY  11/01/2010   COLONOSCOPY WITH PROPOFOL N/A 10/05/2014   Procedure: COLONOSCOPY WITH PROPOFOL;  Surgeon: Gatha Mayer, MD;  [facility];  Service: Endoscopy;  Laterality: N/A;   COLONOSCOPY WITH PROPOFOL N/A 05/06/2018   Procedure: COLONOSCOPY WITH PROPOFOL;  Surgeon: Rush Landmark Telford Nab., MD;  Location: [place];  Service: Gastroenterology;  Laterality: N/A;   ESOPHAGEAL DILATION     ESOPHAGEAL MANOMETRY N/A 08/23/2015   Procedure: ESOPHAGEAL MANOMETRY (EM);  Surgeon: Mauri Pole,  MD;  [facility];  Service: Endoscopy;  Laterality: N/A;   ESOPHAGOGASTRODUODENOSCOPY N/A 10/09/2014   Procedure: ESOPHAGOGASTRODUODENOSCOPY (EGD);  Surgeon: Gatha Mayer, MD;  [facility];  Service: Endoscopy;  Laterality: N/A;   ESOPHAGOGASTRODUODENOSCOPY (EGD) WITH PROPOFOL N/A 08/23/2015   Procedure: ESOPHAGOGASTRODUODENOSCOPY (EGD) WITH PROPOFOL;  Surgeon: Mauri Pole, MD;  [facility];  Service: Endoscopy;  Laterality: N/A;   ESOPHAGOGASTRODUODENOSCOPY (EGD) WITH PROPOFOL N/A 10/06/2015   Procedure: ESOPHAGOGASTRODUODENOSCOPY (EGD) WITH PROPOFOL;  Surgeon: Mauri Pole, MD;  Location: [place] [facility];  Service: Endoscopy;  Laterality: N/A;  Rigiflex ballon size 46m-35mm size 45 minute proc,need Fluro Gastografin esophagram 2 hrs post EGD    GASTRIC ROUX-EN-Y N/A 04/05/2020   Procedure: LAPAROSCOPIC ROUX-EN-Y GASTRIC BYPASS WITH UPPER ENDOSCOPY;  Surgeon: WGreer Pickerel MD;  [facility];  Service: General;  Laterality: N/A;   PACEMAKER INSERTION     POLYPECTOMY  05/06/2018   Procedure: POLYPECTOMY;  Surgeon: MIrving Copas, MD;  [place]  Service: Gastroenterology;;   PBetsy PriesGENERATOR CHANGEOUT N/A 05/01/2018   Procedure: PPM GENERATOR CHANGEOUT;  Surgeon: KDeboraha Sprang MD;  [place] [facility];  Service: Cardiovascular;  Laterality: N/A;   TOTAL KNEE ARTHROPLASTY Left 08/15/2021   Procedure: TOTAL KNEE ARTHROPLASTY;  Surgeon: AGaynelle Arabian MD;  [facility];  Service: Orthopedics;  Laterality: Left;  TOTAL KNEE ARTHROPLASTY Right 04/24/2022   Procedure: TOTAL KNEE ARTHROPLASTY;  Surgeon: Gaynelle Arabian, MD;  [facility];  Service: Orthopedics;  Laterality: Right;   Patient Active Problem List   Diagnosis Date Noted   Osteoarthritis of right knee 04/24/2022   Sore throat 03/20/2022   Strep throat 03/20/2022   COVID-19 03/20/2022   Primary osteoarthritis of left knee 08/15/2021   Double vision  01/05/2021   S/P gastric bypass 04/05/2020   Need for shingles vaccine 01/28/2020   Foreskin problem 08/10/2019   HLD (hyperlipidemia) 08/05/2017   Routine general medical examination at a health care facility 07/13/2016   Advance care planning 07/13/2016   Type 2 diabetes mellitus with diabetic neuropathy, unspecified ([place]) 03/17/2016   Lower urinary tract symptoms (LUTS) 03/16/2016   Dysphagia    Essential hypertension    Hx of colonic polyps    Fatigue 08/04/2014   Rash and nonspecific skin eruption 10/21/2013   Morbid obesity with BMI of 50.0-59.9, adult ([place]) 05/24/2013   History of colonic polyps 04/29/2013   Anxiety 03/10/2013   Pain in joint, shoulder region 02/08/2013   Sinus bradycardia /pauses    Edema 01/31/2012   OA (osteoarthritis) of knee 06/15/2011   Paresthesia 01/08/2011   Achalasia 08/17/2010   Arthritis of knee 01/21/2010   PACEMAKER, St Judes 01/21/2010   Obesity 12/07/2009   OSA (obstructive sleep apnea) 12/07/2009    PCP: Damita Dunnings  REFERRING PROVIDER: Aluisio  REFERRING DIAG: right TKA  THERAPY DIAG:  Stiffness of right knee, not elsewhere classified  Acute pain of right knee  Difficulty in walking, not elsewhere classified  Localized edema  Rationale for Evaluation and Treatment: Rehabilitation  ONSET DATE: 04/24/22  SUBJECTIVE:   SUBJECTIVE STATEMENT: PT enters with sleeve on R knee, reporting R quad feels weak  PERTINENT HISTORY: ICD/Pacemaker PAIN:  Are you having pain? Yes: NPRS scale: 6/10 Pain location: right knee Pain description: ache Aggravating factors: bending, walking pain up to 8/10 Relieving factors: rest, ice, pain meds at best pain a 2/10  PRECAUTIONS: ICD/Pacemaker  WEIGHT BEARING RESTRICTIONS: No  FALLS:  Has patient fallen in last 6 months? No  LIVING ENVIRONMENT: Lives with: lives with their family Lives in: House/apartment Stairs: No Has following equipment at home: Environmental consultant - 4 wheeled  OCCUPATION:  does work, may travel and have to walk a lot  PLOF: Independent with household mobility with device and Independent with community mobility with device, was using a SPC, would like to play golf again  PATIENT GOALS: walk without device, go up and down stairs, have no pain, play golf  NEXT MD VISIT:   OBJECTIVE:  PATIENT SURVEYS:  FOTO 14  COGNITION: Overall cognitive status: Within functional limits for tasks assessed     SENSATION: WFL  EDEMA:  Circumferential: left mid patella 50cm, right 59.5cm  POSTURE: rounded shoulders and forward head  PALPATION: Very tender   LOWER EXTREMITY ROM:  AROM/PROM Right AROM eval Right PROM eval Right AROM 05/12/22 R knee AROM  Hip flexion      Hip extension      Hip abduction      Hip adduction      Hip internal rotation      Hip external rotation      Knee flexion 80 85 110 119  Knee extension 35 _0 Ankle dorsiflexion      Ankle plantarflexion      Ankle inversion      Ankle eversion       (  Blank rows = not tested)  LOWER EXTREMITY MMT:  MMT Right eval Left eval  Hip flexion    Hip extension    Hip abduction    Hip adduction    Hip internal rotation    Hip external rotation    Knee flexion 3+   Knee extension 2+   Ankle dorsiflexion    Ankle plantarflexion    Ankle inversion    Ankle eversion     (Blank rows = not tested)  FUNCTIONAL TESTS:  Timed up and go (TUG): 36 seconds  GAIT: Distance walked: 100 feet Assistive device utilized: Walker - 4 wheeled Level of assistance: SBA Comments: step to pattern   TODAY'S TREATMENT:                                                                                                                              DATE:   06/20/22 Recheck strength and ROM Bike  Step ups  Anterior and lateral step downs Leg ext HS curls Leg press STS    05/26/22 Bike L2.5 x 6 min  Resisted gait 40lb x5 all directions  6 in step ups x10 each   Leg press 70lb 2x12, RLE  30lb 2x12 Leg Ext RLE 15lb 2x10 Hamstring curls RLE 25lb 2x10 S2S holding 7lb dumbbell 2x10  05/19/22 NuStep L5 x 6 min  Leg press 70lb 2x10, RLE 30lb 2x10 6in step ups x10 each 40lb resisted gait x4 each S2S w/ OHP yellow ball 2x10 Hamstring Curls 45lb x15, RLE 25lb 2x15 Leg Ext 15lb x15, RLE 10lb 3x10  05/17/22 NuStep L5 x 4 min LE only Bike L3 x 3 min Leg press 50lb 3x12 Step ups 6in x10 each Hamstring Curls 35lb x15, RLE 20lb 2x15 Leg Ext 10lb x15, RLE 5lb 2x10  05/15/22 NuStep L5 x 6 min LE only Bike L2 x 3 min  Resisted gait 30lb all directions x3 each S2S 2x12 Hamstring Curls 35lb x145, RLE 20lb 2x10 Leg Ext 10lb x15, RLE 5lb 2x10 4in step ups UE x2 x10 each Gait without AD 16f  05/12/22 R knee PROM flex and Ext LAQ RLE 3lb 2x10  RLE hamstring curls green 2x12 S2S 2x12 NuStep L4 x 5 min   Leg press 50lb 2x10, RLE 30lb 2x10 [facility] [id number]/ [facility]  PATIENT EDUCATION:  Education details: HEP/POC Person educated: Patient Education method: ExConsulting civil engineerDeMedia plannerVerbal cues, and Handouts Education comprehension: verbalized understanding  HOME EXERCISE PROGRAM: Access Code: [email or web address] URL: [email or web address] Date: 04/27/2022 Prepared by: MiLum BabeExercises - Supine Heel Slide  - 2 x daily - 7 x weekly - 1 sets - 10 reps - 10 hold - Supine Quad Set  - 2 x daily - 7 x weekly - 1 sets - 10 reps - 2 hold - Seated Long Arc Quad  - 2 x daily - 7 x weekly - 1 sets - 10 reps - 2 hold - Seated Heel Slide  -  2 x daily - 7 x weekly - 1 sets - 10 reps - 10 hold  ASSESSMENT:  CLINICAL IMPRESSION: Pt enters doing well ambulating without AD.  Some functional RLE weakness remains with step ups and sit to stands. Increase resistance tolerated with resisted gait.  No reports of pain throughout session.   OBJECTIVE IMPAIRMENTS: Abnormal gait, cardiopulmonary status limiting activity, decreased activity tolerance, decreased balance, decreased  mobility, difficulty walking, decreased ROM, decreased strength, increased edema, increased muscle spasms, impaired flexibility, and pain.   REHAB POTENTIAL: Good  CLINICAL DECISION MAKING: Evolving/moderate complexity  EVALUATION COMPLEXITY: Low   GOALS: Goals reviewed with patient? Yes  SHORT TERM GOALS: Target date: 05/10/22 Independent with initial HEP Goal status: Met  2.  Safe with walking and transfers in the clinic Goal status: INITIAL   LONG TERM GOALS: Target date: 07/26/22  Independent with advanced HEP Goal status: INITIAL  2.  Decrease pain 50% Goal status: ongoing  3.  Increase knee AROM to 5-115 degrees flexion Goal status: Met 05/19/22  4.  Go up and down stairs step over step Goal status: INITIAL  5.  Increase strength to 4=/5 Goal status: INITIAL  PLAN:  PT FREQUENCY: 3x/week  PT DURATION: 12 weeks  PLANNED INTERVENTIONS: Therapeutic exercises, Therapeutic activity, Neuromuscular re-education, Balance training, Gait training, Patient/Family education, Self Care, Joint mobilization, Joint manipulation, Electrical stimulation, Cryotherapy, Vasopneumatic device, and Manual therapy  PLAN FOR NEXT SESSION: slowly start gym activities, go slow as last time he had a setback with doing too much too soon   Cheri Fowler  05/26/2022, 10:20 AM

## 2022-06-20 ENCOUNTER — Ambulatory Visit: Payer: BC Managed Care – PPO

## 2022-06-21 NOTE — Therapy (Incomplete)
OUTPATIENT PHYSICAL THERAPY LOWER EXTREMITY TREATMENT   Patient Name: Glenn Lyons MRN: [id number] DOB:01-14-1956, 67 y.o., male Today's Date: 06/21/2022  END OF SESSION:     Past Medical History:  Diagnosis Date   Achalasia    with prev eval at [facility].  No intervention as of 2012   Arthritis    Aspiration pneumonia (HCC)    Backache, unspecified    Cardiac pacemaker St. Jude    ERI 2008   Depressive disorder, not elsewhere classified    Diabetes ([place])    Hypertension    Morbid obesity ([place])    Obstructive sleep apnea    biapap settign 25-22   Presence of permanent cardiac pacemaker    St. Jude- Dr. Caryl Comes follows -device Check 07-06-14   Sinus bradycardia /pauses    Stricture and stenosis of esophagus    Past Surgical History:  Procedure Laterality Date   BALLOON DILATION N/A 10/06/2015   Procedure: BALLOON DILATION;  Surgeon: Mauri Pole, MD;  Location: [place] [facility]COPY;  Service: Endoscopy;  Laterality: N/A;   BIOPSY  05/06/2018   Procedure: BIOPSY;  Surgeon: Rush Landmark Telford Nab., MD;  Location: [place];  Service: Gastroenterology;;   COLONOSCOPY W/ POLYPECTOMY  11/01/2010   COLONOSCOPY WITH PROPOFOL N/A 10/05/2014   Procedure: COLONOSCOPY WITH PROPOFOL;  Surgeon: Gatha Mayer, MD;  [facility];  Service: Endoscopy;  Laterality: N/A;   COLONOSCOPY WITH PROPOFOL N/A 05/06/2018   Procedure: COLONOSCOPY WITH PROPOFOL;  Surgeon: Rush Landmark Telford Nab., MD;  Location: [place];  Service: Gastroenterology;  Laterality: N/A;   ESOPHAGEAL DILATION     ESOPHAGEAL MANOMETRY N/A 08/23/2015   Procedure: ESOPHAGEAL MANOMETRY (EM);  Surgeon: Mauri Pole, MD;  [facility];  Service: Endoscopy;  Laterality: N/A;   ESOPHAGOGASTRODUODENOSCOPY N/A 10/09/2014   Procedure: ESOPHAGOGASTRODUODENOSCOPY (EGD);  Surgeon: Gatha Mayer, MD;  [facility];  Service: Endoscopy;  Laterality: N/A;   ESOPHAGOGASTRODUODENOSCOPY (EGD) WITH PROPOFOL N/A  08/23/2015   Procedure: ESOPHAGOGASTRODUODENOSCOPY (EGD) WITH PROPOFOL;  Surgeon: Mauri Pole, MD;  [facility];  Service: Endoscopy;  Laterality: N/A;   ESOPHAGOGASTRODUODENOSCOPY (EGD) WITH PROPOFOL N/A 10/06/2015   Procedure: ESOPHAGOGASTRODUODENOSCOPY (EGD) WITH PROPOFOL;  Surgeon: Mauri Pole, MD;  Location: [place] [facility];  Service: Endoscopy;  Laterality: N/A;  Rigiflex ballon size 66m-35mm size 45 minute proc,need Fluro Gastografin esophagram 2 hrs post EGD    GASTRIC ROUX-EN-Y N/A 04/05/2020   Procedure: LAPAROSCOPIC ROUX-EN-Y GASTRIC BYPASS WITH UPPER ENDOSCOPY;  Surgeon: WGreer Pickerel MD;  [facility];  Service: General;  Laterality: N/A;   PACEMAKER INSERTION     POLYPECTOMY  05/06/2018   Procedure: POLYPECTOMY;  Surgeon: MIrving Copas, MD;  [place]  Service: Gastroenterology;;   PBetsy PriesGENERATOR CHANGEOUT N/A 05/01/2018   Procedure: PPM GENERATOR CHANGEOUT;  Surgeon: KDeboraha Sprang MD;  [place];  Service: Cardiovascular;  Laterality: N/A;   TOTAL KNEE ARTHROPLASTY Left 08/15/2021   Procedure: TOTAL KNEE ARTHROPLASTY;  Surgeon: AGaynelle Arabian MD;  [facility];  Service: Orthopedics;  Laterality: Left;   TOTAL KNEE ARTHROPLASTY Right 04/24/2022   Procedure: TOTAL KNEE ARTHROPLASTY;  Surgeon: AGaynelle Arabian MD;  [facility];  Service: Orthopedics;  Laterality: Right;   Patient Active Problem List   Diagnosis Date Noted   URI (upper respiratory infection) 06/11/2022   Osteoarthritis of right knee 04/24/2022   Primary osteoarthritis of left knee 08/15/2021   Double vision 01/05/2021   S/P gastric bypass 04/05/2020  Need for shingles vaccine 01/28/2020   Foreskin problem 08/10/2019   HLD (hyperlipidemia) 08/05/2017   Routine general medical examination at a health care facility 07/13/2016   Advance care planning 07/13/2016   Type 2 diabetes mellitus with diabetic neuropathy, unspecified ([place])  03/17/2016   Lower urinary tract symptoms (LUTS) 03/16/2016   Dysphagia    Essential hypertension    Hx of colonic polyps    Fatigue 08/04/2014   Rash and nonspecific skin eruption 10/21/2013   Morbid obesity with BMI of 50.0-59.9, adult ([place]) 05/24/2013   History of colonic polyps 04/29/2013   Mood change 03/10/2013   Pain in joint, shoulder region 02/08/2013   Sinus bradycardia /pauses    Edema 01/31/2012   OA (osteoarthritis) of knee 06/15/2011   Paresthesia 01/08/2011   Achalasia 08/17/2010   Arthritis of knee 01/21/2010   PACEMAKER, St Judes 01/21/2010   Obesity 12/07/2009   OSA (obstructive sleep apnea) 12/07/2009    PCP: Damita Dunnings  REFERRING PROVIDER: Aluisio  REFERRING DIAG: right TKA  THERAPY DIAG:  No diagnosis found.  Rationale for Evaluation and Treatment: Rehabilitation  ONSET DATE: 04/24/22  SUBJECTIVE:   SUBJECTIVE STATEMENT: PT enters with sleeve on R knee, reporting R quad feels weak  PERTINENT HISTORY: ICD/Pacemaker PAIN:  Are you having pain? Yes: NPRS scale: 6/10 Pain location: right knee Pain description: ache Aggravating factors: bending, walking pain up to 8/10 Relieving factors: rest, ice, pain meds at best pain a 2/10  PRECAUTIONS: ICD/Pacemaker  WEIGHT BEARING RESTRICTIONS: No  FALLS:  Has patient fallen in last 6 months? No  LIVING ENVIRONMENT: Lives with: lives with their family Lives in: House/apartment Stairs: No Has following equipment at home: Walker - 4 wheeled  OCCUPATION: does work, may travel and have to walk a lot  PLOF: Independent with household mobility with device and Independent with community mobility with device, was using a SPC, would like to play golf again  PATIENT GOALS: walk without device, go up and down stairs, have no pain, play golf  NEXT MD VISIT:   OBJECTIVE:  PATIENT SURVEYS:  FOTO 14  COGNITION: Overall cognitive status: Within functional limits for tasks  assessed     SENSATION: WFL  EDEMA:  Circumferential: left mid patella 50cm, right 59.5cm  POSTURE: rounded shoulders and forward head  PALPATION: Very tender   LOWER EXTREMITY ROM:  AROM/PROM Right AROM eval Right PROM eval Right AROM 05/12/22 R knee AROM  Hip flexion      Hip extension      Hip abduction      Hip adduction      Hip internal rotation      Hip external rotation      Knee flexion 80 85 110 119  Knee extension 35 '30 12 5  '$ Ankle dorsiflexion      Ankle plantarflexion      Ankle inversion      Ankle eversion       (Blank rows = not tested)  LOWER EXTREMITY MMT:  MMT Right eval Left eval  Hip flexion    Hip extension    Hip abduction    Hip adduction    Hip internal rotation    Hip external rotation    Knee flexion 3+   Knee extension 2+   Ankle dorsiflexion    Ankle plantarflexion    Ankle inversion    Ankle eversion     (Blank rows = not tested)  FUNCTIONAL TESTS:  Timed up and go (TUG): 36 seconds  GAIT: Distance walked: 100 feet Assistive device utilized: Walker - 4 wheeled Level of assistance: SBA Comments: step to pattern   TODAY'S TREATMENT:                                                                                                                              DATE:   06/20/22 Recheck strength and ROM Bike  Step ups  Anterior and lateral step downs Leg ext HS curls Leg press STS    05/26/22 Bike L2.5 x 6 min  Resisted gait 40lb x5 all directions  6 in step ups x10 each   Leg press 70lb 2x12, RLE 30lb 2x12 Leg Ext RLE 15lb 2x10 Hamstring curls RLE 25lb 2x10 S2S holding 7lb dumbbell 2x10  05/19/22 NuStep L5 x 6 min  Leg press 70lb 2x10, RLE 30lb 2x10 6in step ups x10 each 40lb resisted gait x4 each S2S w/ OHP yellow ball 2x10 Hamstring Curls 45lb x15, RLE 25lb 2x15 Leg Ext 15lb x15, RLE 10lb 3x10  05/17/22 NuStep L5 x 4 min LE only Bike L3 x 3 min Leg press 50lb 3x12 Step ups 6in x10 each Hamstring  Curls 35lb x15, RLE 20lb 2x15 Leg Ext 10lb x15, RLE 5lb 2x10  05/15/22 NuStep L5 x 6 min LE only Bike L2 x 3 min  Resisted gait 30lb all directions x3 each S2S 2x12 Hamstring Curls 35lb x145, RLE 20lb 2x10 Leg Ext 10lb x15, RLE 5lb 2x10 4in step ups UE x2 x10 each Gait without AD 2f  05/12/22 R knee PROM flex and Ext LAQ RLE 3lb 2x10  RLE hamstring curls green 2x12 S2S 2x12 NuStep L4 x 5 min   Leg press 50lb 2x10, RLE 30lb 2x10 Gait [id number]/ [facility]  PATIENT EDUCATION:  Education details: HEP/POC Person educated: Patient Education method: ExConsulting civil engineerDeMedia plannerVerbal cues, and Handouts Education comprehension: verbalized understanding  HOME EXERCISE PROGRAM: Access Code: [email or web address] URL: [email or web address] Date: 04/27/2022 Prepared by: MiLum BabeExercises - Supine Heel Slide  - 2 x daily - 7 x weekly - 1 sets - 10 reps - 10 hold - Supine Quad Set  - 2 x daily - 7 x weekly - 1 sets - 10 reps - 2 hold - Seated Long Arc Quad  - 2 x daily - 7 x weekly - 1 sets - 10 reps - 2 hold - Seated Heel Slide  - 2 x daily - 7 x weekly - 1 sets - 10 reps - 10 hold  ASSESSMENT:  CLINICAL IMPRESSION: Pt enters doing well ambulating without AD.  Some functional RLE weakness remains with step ups and sit to stands. Increase resistance tolerated with resisted gait.  No reports of pain throughout session.   OBJECTIVE IMPAIRMENTS: Abnormal gait, cardiopulmonary status limiting activity, decreased activity tolerance, decreased balance, decreased mobility, difficulty walking, decreased ROM, decreased strength, increased edema, increased muscle spasms, impaired flexibility, and pain.   REHAB POTENTIAL: Good  CLINICAL DECISION MAKING:  Evolving/moderate complexity  EVALUATION COMPLEXITY: Low   GOALS: Goals reviewed with patient? Yes  SHORT TERM GOALS: Target date: 05/10/22 Independent with initial HEP Goal status: Met  2.  Safe with walking and  transfers in the clinic Goal status: INITIAL   LONG TERM GOALS: Target date: 07/26/22  Independent with advanced HEP Goal status: INITIAL  2.  Decrease pain 50% Goal status: ongoing  3.  Increase knee AROM to 5-115 degrees flexion Goal status: Met 05/19/22  4.  Go up and down stairs step over step Goal status: INITIAL  5.  Increase strength to 4=/5 Goal status: INITIAL  PLAN:  PT FREQUENCY: 3x/week  PT DURATION: 12 weeks  PLANNED INTERVENTIONS: Therapeutic exercises, Therapeutic activity, Neuromuscular re-education, Balance training, Gait training, Patient/Family education, Self Care, Joint mobilization, Joint manipulation, Electrical stimulation, Cryotherapy, Vasopneumatic device, and Manual therapy  PLAN FOR NEXT SESSION: slowly start gym activities, go slow as last time he had a setback with doing too much too soon   Cheri Fowler  06/21/2022, 9:48 AM

## 2022-06-22 ENCOUNTER — Ambulatory Visit: Payer: BC Managed Care – PPO | Attending: Orthopedic Surgery

## 2022-07-17 ENCOUNTER — Ambulatory Visit: Payer: Medicare PPO

## 2022-07-17 DIAGNOSIS — R001 Bradycardia, unspecified: Secondary | ICD-10-CM | POA: Diagnosis not present

## 2022-07-17 LAB — CUP PACEART REMOTE DEVICE CHECK
Battery Remaining Longevity: 71 mo
Battery Remaining Percentage: 66 %
Battery Voltage: 2.99 V
Brady Statistic AP VP Percent: 1 %
Brady Statistic AP VS Percent: 56 %
Brady Statistic AS VP Percent: 1 %
Brady Statistic AS VS Percent: 44 %
Brady Statistic RA Percent Paced: 55 %
Brady Statistic RV Percent Paced: 1 %
Date Time Interrogation Session: 20240219035130
Implantable Lead Connection Status: 753985
Implantable Lead Connection Status: 753985
Implantable Lead Implant Date: 20061219
Implantable Lead Implant Date: 20061219
Implantable Lead Location: 753859
Implantable Lead Location: 753860
Implantable Pulse Generator Implant Date: 20191204
Lead Channel Impedance Value: 1425 Ohm
Lead Channel Impedance Value: 450 Ohm
Lead Channel Pacing Threshold Amplitude: 1.125 V
Lead Channel Pacing Threshold Amplitude: 1.375 V
Lead Channel Pacing Threshold Pulse Width: 0.5 ms
Lead Channel Pacing Threshold Pulse Width: 0.8 ms
Lead Channel Sensing Intrinsic Amplitude: 5 mV
Lead Channel Sensing Intrinsic Amplitude: 8.1 mV
Lead Channel Setting Pacing Amplitude: 1.375
Lead Channel Setting Pacing Amplitude: 2.375
Lead Channel Setting Pacing Pulse Width: 0.5 ms
Lead Channel Setting Sensing Sensitivity: 2.5 mV
Pulse Gen Model: 2272
Pulse Gen Serial Number: 9090970

## 2022-08-07 ENCOUNTER — Encounter: Payer: Self-pay | Admitting: Internal Medicine

## 2022-08-08 ENCOUNTER — Telehealth: Payer: Self-pay | Admitting: *Deleted

## 2022-08-08 NOTE — Telephone Encounter (Signed)
Dr. Carlean Purl.  I'm prepping this patient's chart for PV and see that his past 2 procedures have been at the hospital.  I think this was due to his BMI being > 50.  Last recorded for him was 41.77 on 06/08/22.  He also is 3 yrs over due for colonoscopy.  On his last colon he had 30+ polyps (SSPs).  Just making sure he is good for [facility].   Thanks! Lanelle Bal

## 2022-08-08 NOTE — Telephone Encounter (Signed)
I think he soul be ok for [place] as well

## 2022-08-17 ENCOUNTER — Ambulatory Visit (AMBULATORY_SURGERY_CENTER): Payer: Medicare PPO

## 2022-08-17 ENCOUNTER — Encounter: Payer: Self-pay | Admitting: Internal Medicine

## 2022-08-17 VITALS — Ht 72.0 in | Wt 299.0 lb

## 2022-08-17 DIAGNOSIS — Z86010 Personal history of colonic polyps: Secondary | ICD-10-CM

## 2022-08-17 MED ORDER — NA SULFATE-K SULFATE-MG SULF 17.5-3.13-1.6 GM/177ML PO SOLN: 1.0000 | Freq: Once | ORAL | 0 refills | Status: AC

## 2022-08-17 NOTE — Progress Notes (Signed)
Pre visit completed via phone call; Patient verified name, DOB, and address;  No egg or soy allergy known to patient;  No issues known to pt with past sedation with any surgeries or procedures; Patient denies ever being told they had issues or difficulty with intubation;  No FH of Malignant Hyperthermia; Pt is not on diet pills; Pt is not on home 02  Pt is not on blood thinners  Pt denies issues with constipation;  No A fib or A flutter/PACEMAKER present Have any cardiac testing pending--NO Pt instructed to use [email or web address] or GoodRx for a price reduction on prep;   Insurance verified during [place] appt=[employer]  Patient's chart reviewed by Osvaldo Angst CNRA prior to previsit and patient appropriate for the [place].  Previsit completed and red dot placed by patient's name on their procedure day (on provider's schedule);

## 2022-08-29 NOTE — Progress Notes (Signed)
Remote pacemaker transmission.   

## 2022-08-30 ENCOUNTER — Encounter: Payer: Self-pay | Admitting: Certified Registered Nurse Anesthetist

## 2022-08-31 ENCOUNTER — Encounter: Payer: Self-pay | Admitting: Internal Medicine

## 2022-08-31 ENCOUNTER — Ambulatory Visit (AMBULATORY_SURGERY_CENTER): Payer: Medicare PPO | Admitting: Internal Medicine

## 2022-08-31 VITALS — BP 161/95 | HR 60 | Temp 98.4°F | Resp 12 | Ht 72.0 in | Wt 299.0 lb

## 2022-08-31 DIAGNOSIS — K621 Rectal polyp: Secondary | ICD-10-CM | POA: Diagnosis not present

## 2022-08-31 DIAGNOSIS — Z86010 Personal history of colonic polyps: Secondary | ICD-10-CM

## 2022-08-31 DIAGNOSIS — D123 Benign neoplasm of transverse colon: Secondary | ICD-10-CM | POA: Diagnosis not present

## 2022-08-31 DIAGNOSIS — D128 Benign neoplasm of rectum: Secondary | ICD-10-CM

## 2022-08-31 DIAGNOSIS — Z09 Encounter for follow-up examination after completed treatment for conditions other than malignant neoplasm: Secondary | ICD-10-CM | POA: Diagnosis not present

## 2022-08-31 MED ORDER — SODIUM CHLORIDE 0.9 % IV SOLN: 500.0000 mL | Freq: Once | INTRAVENOUS | Status: DC

## 2022-08-31 NOTE — Patient Instructions (Addendum)
I removed 8 polyps today - there are some other tiny pnes that can be watcvhed as we have before.  You still have a condition called diverticulosis - common and not usually a problem. Please read the handout provided.  Hemorrhoids were a little swollen.  I will let you know pathology results and when to have another routine colonoscopy by mail and/or My Chart.  I appreciate the opportunity to care for you. Gatha Mayer, MD, FACG YOU HAD AN ENDOSCOPIC PROCEDURE TODAY AT [facility]:   Refer to the procedure report that was given to you for any specific questions about what was found during the examination.  If the procedure report does not answer your questions, please call your gastroenterologist to clarify.  If you requested that your care partner not be given the details of your procedure findings, then the procedure report has been included in a sealed envelope for you to review at your convenience later.  YOU SHOULD EXPECT: Some feelings of bloating in the abdomen. Passage of more gas than usual.  Walking can help get rid of the air that was put into your GI tract during the procedure and reduce the bloating. If you had a lower endoscopy (such as a colonoscopy or flexible sigmoidoscopy) you may notice spotting of blood in your stool or on the toilet paper. If you underwent a bowel prep for your procedure, you may not have a normal bowel movement for a few days.  Please Note:  You might notice some irritation and congestion in your nose or some drainage.  This is from the oxygen used during your procedure.  There is no need for concern and it should clear up in a day or so.  SYMPTOMS TO REPORT IMMEDIATELY:  Following lower endoscopy (colonoscopy or flexible sigmoidoscopy):  Excessive amounts of blood in the stool  Significant tenderness or worsening of abdominal pains  Swelling of the abdomen that is new, acute  Fever of 100F or higher    For urgent or emergent  issues, a gastroenterologist can be reached at any hour by calling [phone]. Do not use MyChart messaging for urgent concerns.    DIET:  We do recommend a small meal at first, but then you may proceed to your regular diet.  Drink plenty of fluids but you should avoid alcoholic beverages for 24 hours.  ACTIVITY:  You should plan to take it easy for the rest of today and you should NOT DRIVE or use heavy machinery until tomorrow (because of the sedation medicines used during the test).    FOLLOW UP: Our staff will call the number listed on your records the next business day following your procedure.  We will call around 7:15- 8:00 am to check on you and address any questions or concerns that you may have regarding the information given to you following your procedure. If we do not reach you, we will leave a message.     If any biopsies were taken you will be contacted by phone or by letter within the next 1-3 weeks.  Please call us at [phone] if you have not heard about the biopsies in 3 weeks.    SIGNATURES/CONFIDENTIALITY: You and/or your care partner have signed paperwork which will be entered into your electronic medical record.  These signatures attest to the fact that that the information above on your After Visit Summary has been reviewed and is understood.  Full responsibility of the confidentiality of this discharge  information lies with you and/or your care-partner. 

## 2022-08-31 NOTE — Op Note (Signed)
[facility] Patient Name: Glenn Lyons Procedure Date: 08/31/2022 7:51 AM MRN: [id number] Endoscopist: Gatha Mayer , MD, [phone] Age: 67 Referring MD:  Date of Birth: 12-14-1955 Gender: Male Account #: [id number] Procedure:                Colonoscopy Indications:              High risk colon cancer surveillance: Personal                            history of Serrated Polyposis Syndrome, Last                            colonoscopy: 2019 Medicines:                Monitored Anesthesia Care Procedure:                Pre-Anesthesia Assessment:                           - Prior to the procedure, a History and Physical                            was performed, and patient medications and                            allergies were reviewed. The patient's tolerance of                            previous anesthesia was also reviewed. The risks                            and benefits of the procedure and the sedation                            options and risks were discussed with the patient.                            All questions were answered, and informed consent                            was obtained. Prior Anticoagulants: The patient has                            taken no anticoagulant or antiplatelet agents. ASA                            Grade Assessment: III - A patient with severe                            systemic disease. After reviewing the risks and                            benefits, the patient was deemed in satisfactory  condition to undergo the procedure.                           After obtaining informed consent, the colonoscope                            was passed under direct vision. Throughout the                            procedure, the patient's blood pressure, pulse, and                            oxygen saturations were monitored continuously. The                            Olympus CF-HQ190L [id number]) Colonoscope was                             introduced through the anus and advanced to the the                            cecum, identified by appendiceal orifice and                            ileocecal valve. The colonoscopy was somewhat                            difficult due to significant looping. Successful                            completion of the procedure was aided by applying                            abdominal pressure. The patient tolerated the                            procedure well. The quality of the bowel                            preparation was adequate. The bowel preparation                            used was SUPREP via split dose instruction. Scope In: 8:07:33 AM Scope Out: 8:34:22 AM Scope Withdrawal Time: 0 hours 20 minutes 39 seconds  Total Procedure Duration: 0 hours 26 minutes 49 seconds  Findings:                 The perianal and digital rectal examinations were                            normal. Pertinent negatives include normal prostate                            (size, shape, and consistency).  A 4 mm polyp was found in the transverse colon. The                            polyp was sessile. The polyp was removed with a                            cold snare. Resection and retrieval were complete.                            Verification of patient identification for the                            specimen was done. Estimated blood loss was                            minimal. Estimated blood loss was minimal.                           Many flat polyps were found in the rectum. The                            polyps were diminutive in size. These polyps were                            removed with a cold biopsy forceps. Resection and                            retrieval were complete. Verification of patient                            identification for the specimen was done. Estimated                            blood loss was minimal.                            Multiple diverticula were found in the sigmoid                            colon.                           Internal hemorrhoids were found.                           The exam was otherwise without abnormality on                            direct and retroflexion views. Complications:            No immediate complications. Estimated Blood Loss:     Estimated blood loss was minimal. Impression:               - One 4 mm polyp in the transverse colon, removed  with a cold snare. Resected and retrieved.                           - Many diminutive polyps in the rectum, 7 removed                            with a cold biopsy forceps. Resected and retrieved.                           - Diverticulosis in the sigmoid colon.                           - Internal hemorrhoids.                           - The examination was otherwise normal on direct                            and retroflexion views.                           - Personal history of colonic polyps. 2012 - mix of                            hyperplastic and adenomas removed up to 10 mm w/                            TNTC diminutive rectal and sigmoid polyps                           09/2014 - TNTC 1- 4 mm rectal and sigmoid sessile                            serrated polyps                           2019 numerous polyps rectum/rectosuigoid HPP and SSP Recommendation:           - Patient has a contact number available for                            emergencies. The signs and symptoms of potential                            delayed complications were discussed with the                            patient. Return to normal activities tomorrow.                            Written discharge instructions were provided to the                            patient.                           -  Resume previous diet.                           - Continue present medications.                           - Await pathology  results.                           - Repeat colonoscopy is recommended for                            surveillance. The colonoscopy date will be                            determined after pathology results from today's                            exam become available for review. Gatha Mayer, MD 08/31/2022 8:47:58 AM This report has been signed electronically.

## 2022-08-31 NOTE — Progress Notes (Signed)
[facility] History and Physical   Primary Care Physician:  Tonia Ghent, MD   Reason for Procedure:   Hx colonic polyps  Plan:    colonoscopy     HPI: Glenn Lyons is a 67 y.o. male   2012 - mix of hyperplastic and adenomas removed up to 10 mm w/ TNTC diminutive rectal and sigmoid polyps 09/2014 - TNTC 1- 4 mm rectal and sigmoid sessile serrated polyps  2019 numerous polyps Past Medical History:  Diagnosis Date   Achalasia    with prev eval at [facility].  No intervention as of 2012   Arthritis    Aspiration pneumonia    Backache, unspecified    Cardiac pacemaker [employer]. Jude    ERI 2008   Depressive disorder, not elsewhere classified    hx of   Diabetes    no meds/diet controlled   Hypertension    NO meds   Morbid obesity    Obstructive sleep apnea    biapap settign 25-22   Presence of permanent cardiac pacemaker    St. Jude- Dr. Caryl Comes follows -replacement 07-06-16   Sinus bradycardia /pauses    Stricture and stenosis of esophagus     Past Surgical History:  Procedure Laterality Date   BALLOON DILATION N/A 10/06/2015   Procedure: BALLOON DILATION;  Surgeon: Mauri Pole, MD;  Location: [facility];  Service: Endoscopy;  Laterality: N/A;   BIOPSY  05/06/2018   Procedure: BIOPSY;  Surgeon: Rush Landmark Telford Nab., MD;  Location: [place];  Service: Gastroenterology;;   COLONOSCOPY W/ POLYPECTOMY  11/01/2010   COLONOSCOPY WITH PROPOFOL N/A 10/05/2014   Procedure: COLONOSCOPY WITH PROPOFOL;  Surgeon: Gatha Mayer, MD;  Location: [facility];  Service: Endoscopy;  Laterality: N/A;   COLONOSCOPY WITH PROPOFOL N/A 05/06/2018   Procedure: COLONOSCOPY WITH PROPOFOL;  Surgeon: Rush Landmark Telford Nab., MD;  Location: [facility];  Service: Gastroenterology;  Laterality: N/A;   ESOPHAGEAL DILATION     ESOPHAGEAL MANOMETRY N/A 08/23/2015   Procedure: ESOPHAGEAL MANOMETRY (EM);  Surgeon: Mauri Pole, MD;  Location: [facility];  Service: Endoscopy;   Laterality: N/A;   ESOPHAGOGASTRODUODENOSCOPY N/A 10/09/2014   Procedure: ESOPHAGOGASTRODUODENOSCOPY (EGD);  Surgeon: Gatha Mayer, MD;  Location: [facility];  Service: Endoscopy;  Laterality: N/A;   ESOPHAGOGASTRODUODENOSCOPY (EGD) WITH PROPOFOL N/A 08/23/2015   Procedure: ESOPHAGOGASTRODUODENOSCOPY (EGD) WITH PROPOFOL;  Surgeon: Mauri Pole, MD;  Location: [facility];  Service: Endoscopy;  Laterality: N/A;   ESOPHAGOGASTRODUODENOSCOPY (EGD) WITH PROPOFOL N/A 10/06/2015   Procedure: ESOPHAGOGASTRODUODENOSCOPY (EGD) WITH PROPOFOL;  Surgeon: Mauri Pole, MD;  Location: [facility];  Service: Endoscopy;  Laterality: N/A;  Rigiflex ballon size 26mm-35mm size 45 minute proc,need Fluro Gastografin esophagram 2 hrs post EGD    GASTRIC ROUX-EN-Y N/A 04/05/2020   Procedure: LAPAROSCOPIC ROUX-EN-Y GASTRIC BYPASS WITH UPPER ENDOSCOPY;  Surgeon: Greer Pickerel, MD;  Location: [facility];  Service: General;  Laterality: N/A;   PACEMAKER INSERTION     POLYPECTOMY  05/06/2018   Procedure: POLYPECTOMY;  Surgeon: Irving Copas., MD;  Location: [facility];  Service: Gastroenterology;;   Betsy Pries GENERATOR CHANGEOUT N/A 05/01/2018   Procedure: PPM GENERATOR CHANGEOUT;  Surgeon: Deboraha Sprang, MD;  Location: [facility];  Service: Cardiovascular;  Laterality: N/A;   TOTAL KNEE ARTHROPLASTY Left 08/15/2021   Procedure: TOTAL KNEE ARTHROPLASTY;  Surgeon: Gaynelle Arabian, MD;  Location: [facility];  Service: Orthopedics;  Laterality: Left;   TOTAL KNEE ARTHROPLASTY Right 04/24/2022   Procedure: TOTAL KNEE ARTHROPLASTY;  Surgeon: Gaynelle Arabian, MD;  Location: [facility];  Service: Orthopedics;  Laterality: Right;    Prior to Admission medications   Medication Sig Start Date End Date Taking? Authorizing Provider  acetaminophen (TYLENOL) 500 MG tablet Take 1,000 mg by mouth every 6 (six) hours as needed for moderate pain.   Yes [provider]  CALCIUM PO Take 1,500 mg by mouth daily.   Yes  [provider]  Multiple Vitamins-Minerals (BARIATRIC MULTIVITAMINS/IRON) CAPS Take 2 tablets by mouth daily at 6 (six) AM. chewable   Yes [provider]  Aromatic Inhalants (VICKS VAPOR INHALER IN) Place 1 spray into both nostrils daily as needed (congestion).    [provider]  Blood Glucose Monitoring Suppl (ONETOUCH VERIO) w/Device KIT Inject 1 kit into the skin every morning. Use to test blood sugar each day before breakfast and 2 hours after a meal for 2 weeks.  Diagnosis:  E11.9  Non-insulin dependent. 06/13/16   Tonia Ghent, MD  Lancets [facility] ULTRASOFT) lancets USE TO TEST BLOOD SUGAR ONCE DAILY OR AS NEEDED 03/12/17   Tonia Ghent, MD  [facility] VERIO test strip USE TO TEST BLOOD SUGAR ONCE DAILY OR AS INSTRUCTED 10/04/20   Tonia Ghent, MD  sildenafil (VIAGRA) 50 MG tablet Take 50 mg by mouth daily as needed for erectile dysfunction.    [provider]    Current Outpatient Medications  Medication Sig Dispense Refill   acetaminophen (TYLENOL) 500 MG tablet Take 1,000 mg by mouth every 6 (six) hours as needed for moderate pain.     CALCIUM PO Take 1,500 mg by mouth daily.     Multiple Vitamins-Minerals (BARIATRIC MULTIVITAMINS/IRON) CAPS Take 2 tablets by mouth daily at 6 (six) AM. chewable     Aromatic Inhalants (VICKS VAPOR INHALER IN) Place 1 spray into both nostrils daily as needed (congestion).     Blood Glucose Monitoring Suppl (ONETOUCH VERIO) w/Device KIT Inject 1 kit into the skin every morning. Use to test blood sugar each day before breakfast and 2 hours after a meal for 2 weeks.  Diagnosis:  E11.9  Non-insulin dependent. 1 kit 0   Lancets (ONETOUCH ULTRASOFT) lancets USE TO TEST BLOOD SUGAR ONCE DAILY OR AS NEEDED 100 each 3   ONETOUCH VERIO test strip USE TO TEST BLOOD SUGAR ONCE DAILY OR AS INSTRUCTED 100 strip 3   sildenafil (VIAGRA) 50 MG tablet Take 50 mg by mouth daily as needed for erectile dysfunction.     Current  Facility-Administered Medications  Medication Dose Route Frequency Provider Last Rate Last Admin   0.9 %  sodium chloride infusion  500 mL Intravenous Once Gatha Mayer, MD        Allergies as of 08/31/2022 - Review Complete 08/31/2022  Allergen Reaction Noted   Morphine Other (See Comments)    Metformin and related  04/01/2017    Family History  Problem Relation Age of Onset   Hypertension Mother    Dementia Mother    Heart attack Father    Heart disease Father    Cancer Father        multiple myeloma   Hypertension Father    Prostate cancer Father        possible dx   Colon cancer Neg Hx    Colon polyps Neg Hx    Esophageal cancer Neg Hx    Stomach cancer Neg Hx    Rectal cancer Neg Hx     Social History   Socioeconomic  History   Marital status: Married    Spouse name: Not on file   Number of children: 2   Years of education: Not on file   Highest education level: Not on file  Occupational History   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   Occupation: singer    Employer: [employer]  Tobacco Use   Smoking status: Former    Packs/day: 1.00    Years: 20.00    Additional pack years: 0.00    Total pack years: 20.00    Types: Cigarettes    Quit date: 05/29/2004    Years since quitting: 18.2   Smokeless tobacco: Never  Vaping Use   Vaping Use: Never used  Substance and Sexual Activity   Alcohol use: Never   Drug use: No   Sexual activity: Yes  Other Topics Concern   Not on file  Social History Narrative   From Air cabin crew   Married   Social Determinants of Health   Financial Resource Strain: Not on file  Food Insecurity: No Food Insecurity (04/24/2022)   Hunger Vital Sign    Worried About Running Out of Food in the Last Year: Never true    [place] in the Last Year: Never true  Transportation Needs: No Transportation Needs (04/24/2022)   PRAPARE - Hydrologist (Medical): No    Lack of Transportation  (Non-Medical): No  Physical Activity: Not on file  Stress: Not on file  Social Connections: Not on file  Intimate Partner Violence: Not At Risk (04/24/2022)   Humiliation, Afraid, Rape, and Kick questionnaire    Fear of Current or Ex-Partner: No    Emotionally Abused: No    Physically Abused: No    Sexually Abused: No    Review of Systems:  All other review of systems negative except as mentioned in the HPI.  Physical Exam: Vital signs BP (!) 166/94   Pulse 60   Temp 98.4 F (36.9 C)   Resp 12   Ht 6' (1.829 m)   Wt 299 lb (135.6 kg)   SpO2 98%   BMI 40.55 kg/m   General:   Alert,  Well-developed, well-nourished, pleasant and cooperative in NAD Lungs:  Clear throughout to auscultation.   Heart:  Regular rate and rhythm; no murmurs, clicks, rubs,  or gallops. Abdomen:  Soft, nontender and nondistended. Normal bowel sounds.   Neuro/Psych:  Alert and cooperative. Normal mood and affect. A and O x 3   @Haig Gerardo  Simonne Maffucci, MD, [facility] [phone] (pager) 08/31/2022 8:02 AM@

## 2022-08-31 NOTE — Progress Notes (Signed)
Report given to PACU, vss 

## 2022-08-31 NOTE — Progress Notes (Signed)
Pt's states no medical or surgical changes since previsit or office visit. 

## 2022-08-31 NOTE — Progress Notes (Signed)
Called to room to assist during endoscopic procedure.  Patient ID and intended procedure confirmed with present staff. Received instructions for my participation in the procedure from the performing physician.  

## 2022-09-01 ENCOUNTER — Telehealth: Payer: Self-pay | Admitting: *Deleted

## 2022-09-01 NOTE — Telephone Encounter (Signed)
  Follow up Call-     08/31/2022    7:30 AM  Call back number  Post procedure Call Back phone  # [phone]  Permission to leave phone message Yes     Patient questions:  Do you have a fever, pain , or abdominal swelling? No. Pain Score  0 *  Have you tolerated food without any problems? Yes.    Have you been able to return to your normal activities? Yes.    Do you have any questions about your discharge instructions: Diet   No. Medications  No. Follow up visit  No.  Do you have questions or concerns about your Care? No.  Actions: * If pain score is 4 or above: No action needed, pain <4.

## 2022-09-11 ENCOUNTER — Encounter (HOSPITAL_BASED_OUTPATIENT_CLINIC_OR_DEPARTMENT_OTHER): Payer: Self-pay | Admitting: Pulmonary Disease

## 2022-09-11 ENCOUNTER — Encounter: Payer: Self-pay | Admitting: *Deleted

## 2022-09-11 ENCOUNTER — Ambulatory Visit (INDEPENDENT_AMBULATORY_CARE_PROVIDER_SITE_OTHER): Payer: Medicare PPO | Admitting: Pulmonary Disease

## 2022-09-11 ENCOUNTER — Telehealth: Payer: Self-pay | Admitting: Pulmonary Disease

## 2022-09-11 VITALS — BP 144/82 | HR 61 | Temp 97.6°F | Ht 71.5 in | Wt 307.3 lb

## 2022-09-11 DIAGNOSIS — G4733 Obstructive sleep apnea (adult) (pediatric): Secondary | ICD-10-CM | POA: Diagnosis not present

## 2022-09-11 NOTE — Patient Instructions (Signed)
Will arrange for new Bipap machine  Follow up in 4 months 

## 2022-09-11 NOTE — Telephone Encounter (Signed)
Spoke with patients wife. Advised Dr. Craige Cotta will still see him today. nfn

## 2022-09-11 NOTE — Progress Notes (Signed)
[facility]  Chief Complaint  Patient presents with   Follow-up    Due for another bipap machine.     Past Surgical History:  He  has a past surgical history that includes Pacemaker insertion; Colonoscopy w/ polypectomy (11/01/2010); Esophageal dilation; Colonoscopy with propofol (N/A, 10/05/2014); Esophagogastroduodenoscopy (N/A, 10/09/2014); Esophageal manometry (N/A, 08/23/2015); Esophagogastroduodenoscopy (egd) with propofol (N/A, 08/23/2015); Esophagogastroduodenoscopy (egd) with propofol (N/A, 10/06/2015); Balloon dilation (N/A, 10/06/2015); PPM GENERATOR CHANGEOUT (N/A, 05/01/2018); Colonoscopy with propofol (N/A, 05/06/2018); polypectomy (05/06/2018); biopsy (05/06/2018); Gastric Roux-En-Y (N/A, 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  Past Medical History:  Achalasia, Depression, OA, Sinus bradycardia s/p PM, Pneumonia 2016   Constitutional:  BP (!) 144/82 (BP Location: Left Arm, Patient Position: Sitting, Cuff Size: Normal)   Pulse 61   Temp 97.6 F (36.4 C) (Oral)   Ht 5' 11.5" (1.816 m)   Wt (!) 307 lb 5.1 oz (139.4 kg)   SpO2 97%   BMI 42.27 kg/m   Brief Summary:  Glenn Lyons is a 67 y.o. male with obstructive sleep apnea.      Subjective:   He was last seen in office in 2019.  He had sleep study in 2007.  Showed very severe sleep apnea.  His weight was up to over 400 lbs.  He had weight loss surgery.  He can't sleep w/o Bipap.  No issues with mask fit.  He goes to sleep at 11 pm.  He falls asleep quickly.  He wakes up 1 or 2 times to use the bathroom.  He gets out of bed at 5 am.  He feels rested in the morning.  He denies morning headache.  He does not use anything to help him fall sleep or stay awake.  He denies sleep walking, sleep talking, bruxism, or nightmares.  There is no history of restless legs.  He denies sleep hallucinations, sleep paralysis, or  cataplexy.  The Epworth score is 1 out of 24.   Physical Exam:   Appearance - well kempt   ENMT - no sinus tenderness, no oral exudate, no LAN, Mallampati 3 airway, no stridor  Respiratory - equal breath sounds bilaterally, no wheezing or rales  CV - s1s2 regular rate and rhythm, no murmurs  Ext - no clubbing, no edema  Skin - no rashes  Psych - normal mood and affect   Sleep Tests:  PSG 2007 >> AHI 107 BiPAP 02/05/16 to 03/05/16 >> used on 30 of 30 nights with average 7 hrs 33 min.  Average AHI 1.9 with BiPAP 25/21 cm H2O  Cardiac Tests:  Echo 03/13/17 >> EF 60 to 65%, grade 2 DD  Social History:  He  reports that he quit smoking about 18 years ago. His smoking use included cigarettes. He has a 20.00 pack-year smoking history. He has never used smokeless tobacco. He reports that he does not drink alcohol and does not use drugs.  Family History:  His family history includes Cancer in his father; Dementia in his mother; Heart attack in his father; Heart disease in his father; Hypertension in his father and mother; Prostate cancer in his father.     Assessment/Plan:   Obstructive sleep apnea. - he is compliant with Bipap and reports benefit from therapy - he uses [employer] for his DME - his current device is more than 65 yrs old - will arrange for a new Resmed Bipap at 25/21 cm H2O - explained his  insurance might require him to get repeat sleep testing prior to authorizing a new Bipap machine  Sinus node dysfunction s/p PPM. - followed Dr. Berton Mount with EP cardiology  Time Spent Involved in Patient Care on Day of Examination:  35 minutes  Follow up:   Patient Instructions  Will arrange for new Bipap machine  Follow up in 4 months  Medication List:   Allergies as of 09/11/2022       Reactions   Morphine Other (See Comments)   Cardiac Arrest   Metformin And Related    Intolerant of any dose due to aches        Medication List        Accurate as of  September 11, 2022  3:27 PM. If you have any questions, ask your nurse or doctor.          acetaminophen 500 MG tablet Commonly known as: TYLENOL Take 1,000 mg by mouth every 6 (six) hours as needed for moderate pain.   Bariatric Multivitamins/Iron Caps Take 2 tablets by mouth daily at 6 (six) AM. chewable   CALCIUM PO Take 1,500 mg by mouth daily.   onetouch ultrasoft lancets USE TO TEST BLOOD SUGAR ONCE DAILY OR AS NEEDED   OneTouch Verio test strip Generic drug: glucose blood USE TO TEST BLOOD SUGAR ONCE DAILY OR AS INSTRUCTED   OneTouch Verio w/Device Kit Inject 1 kit into the skin every morning. Use to test blood sugar each day before breakfast and 2 hours after a meal for 2 weeks.  Diagnosis:  E11.9  Non-insulin dependent.   sildenafil 50 MG tablet Commonly known as: VIAGRA Take 50 mg by mouth daily as needed for erectile dysfunction.   VICKS VAPOR INHALER IN Place 1 spray into both nostrils daily as needed (congestion).        Signature:  Coralyn Helling, MD [facility] Pager - [phone] 09/11/2022, 3:27 PM

## 2022-09-11 NOTE — Telephone Encounter (Signed)
Okay to keep appointment 

## 2022-09-11 NOTE — Telephone Encounter (Signed)
Pt's spouse Okey Regal called the office stating that her husband who is the pt has an upcoming appt today 4/15 with Dr. Craige Cotta.  Okey Regal stated that she believes she has strep throat as she is very prone to getting it. She also said that pt has been exposed to her. Pt is not experiencing any current symptoms.   With her suspicion of having strep, she wants to know if pt will be okay to keep his appt or if that appt needs to be rescheduled. Dr. Craige Cotta, please advise.

## 2022-09-12 ENCOUNTER — Encounter: Payer: Self-pay | Admitting: Internal Medicine

## 2022-09-13 ENCOUNTER — Other Ambulatory Visit: Payer: Self-pay | Admitting: Pulmonary Disease

## 2022-09-13 DIAGNOSIS — G4733 Obstructive sleep apnea (adult) (pediatric): Secondary | ICD-10-CM

## 2022-09-13 NOTE — Progress Notes (Signed)
ATC pt unable to reach LVM for him to call office back.

## 2022-09-13 NOTE — Progress Notes (Unsigned)
Please let him know his insurance requires him to do a home sleep study first before getting a new Bipap machine.  I have put in order for HST.

## 2022-09-25 ENCOUNTER — Ambulatory Visit (INDEPENDENT_AMBULATORY_CARE_PROVIDER_SITE_OTHER): Payer: Medicare PPO

## 2022-09-25 DIAGNOSIS — G4733 Obstructive sleep apnea (adult) (pediatric): Secondary | ICD-10-CM

## 2022-10-02 NOTE — Telephone Encounter (Signed)
Pt did not complete hst. Nothing to download from the sleep device. I spoke with the patient and he stated he could not tolerate the nasal canula as it made it hard for him to breathe.

## 2022-10-05 NOTE — Telephone Encounter (Signed)
Please ask him if he would want to try rescheduling home sleep study or arrange for an in lab sleep study.

## 2022-10-09 ENCOUNTER — Telehealth: Payer: Self-pay | Admitting: Pulmonary Disease

## 2022-10-09 DIAGNOSIS — G4733 Obstructive sleep apnea (adult) (pediatric): Secondary | ICD-10-CM

## 2022-10-09 NOTE — Telephone Encounter (Signed)
Please see phone note from 04/15 looks like it was closed

## 2022-10-09 NOTE — Telephone Encounter (Signed)
Pt does need to be rescheduled for HST, please reach out

## 2022-10-09 NOTE — Telephone Encounter (Signed)
Pt calling in to get resched for HST

## 2022-10-09 NOTE — Telephone Encounter (Signed)
ATC pt LVM for him to call office back in regards to rescheduling his HST or doing and in-lab study. Instructed pt to call back

## 2022-10-10 ENCOUNTER — Ambulatory Visit (INDEPENDENT_AMBULATORY_CARE_PROVIDER_SITE_OTHER): Payer: Medicare PPO | Admitting: Family Medicine

## 2022-10-10 ENCOUNTER — Encounter: Payer: Self-pay | Admitting: Family Medicine

## 2022-10-10 VITALS — BP 124/58 | HR 60 | Temp 98.1°F | Ht 71.25 in | Wt 312.0 lb

## 2022-10-10 DIAGNOSIS — Z0000 Encounter for general adult medical examination without abnormal findings: Secondary | ICD-10-CM | POA: Diagnosis not present

## 2022-10-10 DIAGNOSIS — G4733 Obstructive sleep apnea (adult) (pediatric): Secondary | ICD-10-CM

## 2022-10-10 DIAGNOSIS — R4586 Emotional lability: Secondary | ICD-10-CM | POA: Diagnosis not present

## 2022-10-10 DIAGNOSIS — Z950 Presence of cardiac pacemaker: Secondary | ICD-10-CM

## 2022-10-10 DIAGNOSIS — Z8639 Personal history of other endocrine, nutritional and metabolic disease: Secondary | ICD-10-CM

## 2022-10-10 DIAGNOSIS — Z7189 Other specified counseling: Secondary | ICD-10-CM

## 2022-10-10 DIAGNOSIS — Z9884 Bariatric surgery status: Secondary | ICD-10-CM

## 2022-10-10 DIAGNOSIS — Z125 Encounter for screening for malignant neoplasm of prostate: Secondary | ICD-10-CM | POA: Diagnosis not present

## 2022-10-10 DIAGNOSIS — K220 Achalasia of cardia: Secondary | ICD-10-CM

## 2022-10-10 DIAGNOSIS — N529 Male erectile dysfunction, unspecified: Secondary | ICD-10-CM | POA: Diagnosis not present

## 2022-10-10 LAB — COMPREHENSIVE METABOLIC PANEL
ALT: 12 U/L (ref 0–53)
AST: 15 U/L (ref 0–37)
Albumin: 4 g/dL (ref 3.5–5.2)
Alkaline Phosphatase: 89 U/L (ref 39–117)
BUN: 15 mg/dL (ref 6–23)
CO2: 29 mEq/L (ref 19–32)
Calcium: 10 mg/dL (ref 8.4–10.5)
Chloride: 104 mEq/L (ref 96–112)
Creatinine, Ser: 0.92 mg/dL (ref 0.40–1.50)
GFR: 87.45 mL/min (ref >60.00)
Glucose, Bld: 93 mg/dL (ref 70–99)
Potassium: 4.5 mEq/L (ref 3.5–5.1)
Sodium: 141 mEq/L (ref 135–145)
Total Bilirubin: 0.6 mg/dL (ref 0.2–1.2)
Total Protein: 6.6 g/dL (ref 6.0–8.3)

## 2022-10-10 LAB — CBC WITH DIFFERENTIAL/PLATELET
Basophils Absolute: 0.1 10*3/uL (ref 0.0–0.1)
Basophils Relative: 0.5 % (ref 0.0–3.0)
Eosinophils Absolute: 0.3 10*3/uL (ref 0.0–0.7)
Eosinophils Relative: 3 % (ref 0.0–5.0)
HCT: 44 % (ref 39.0–52.0)
Hemoglobin: 14.6 g/dL (ref 13.0–17.0)
Lymphocytes Relative: 15.9 % (ref 12.0–46.0)
Lymphs Abs: 1.5 10*3/uL (ref 0.7–4.0)
MCHC: 33.2 g/dL (ref 30.0–36.0)
MCV: 91.9 fl (ref 78.0–100.0)
Monocytes Absolute: 0.6 10*3/uL (ref 0.1–1.0)
Monocytes Relative: 6.5 % (ref 3.0–12.0)
Neutro Abs: 7.1 10*3/uL (ref 1.4–7.7)
Neutrophils Relative %: 74.1 % (ref 43.0–77.0)
Platelets: 269 10*3/uL (ref 150.0–400.0)
RBC: 4.79 Mil/uL (ref 4.22–5.81)
RDW: 13.8 % (ref 11.5–15.5)
WBC: 9.5 10*3/uL (ref 4.0–10.5)

## 2022-10-10 LAB — PSA, MEDICARE: PSA: 1.23 ng/ml (ref 0.10–4.00)

## 2022-10-10 LAB — LIPID PANEL
Cholesterol: 182 mg/dL (ref 0–200)
HDL: 37.9 mg/dL — ABNORMAL LOW (ref >39.00)
NonHDL: 143.92
Total CHOL/HDL Ratio: 5
Triglycerides: 266 mg/dL — ABNORMAL HIGH (ref 0.0–149.0)
VLDL: 53.2 mg/dL — ABNORMAL HIGH (ref 0.0–40.0)

## 2022-10-10 LAB — HEMOGLOBIN A1C: Hgb A1c MFr Bld: 6 % (ref 4.6–6.5)

## 2022-10-10 LAB — VITAMIN B12: Vitamin B-12: 511 pg/mL (ref 211–911)

## 2022-10-10 LAB — LDL CHOLESTEROL, DIRECT: Direct LDL: 114 mg/dL

## 2022-10-10 MED ORDER — ONETOUCH VERIO VI STRP: ORAL_STRIP | 3 refills | Status: AC

## 2022-10-10 NOTE — Patient Instructions (Addendum)
Go to the lab on the way out.   If you have mychart we'll likely use that to update you.    When you get set up in [place], ask about aortic screening with an ultrasound to make sure you don't have an aneurysm.   PNA-20 when possible.   Take care.  Glad to see you.

## 2022-10-10 NOTE — Progress Notes (Unsigned)
I have personally reviewed the Medicare Annual Wellness questionnaire and have noted 1. The patient's medical and social history 2. Their use of alcohol, tobacco or illicit drugs 3. Their current medications and supplements 4. The patient's functional ability including ADL's, fall risks, home safety risks and hearing or visual             impairment. 5. Diet and physical activities 6. Evidence for depression or mood disorders  The patients weight, height, BMI have been recorded in the chart and visual acuity is per eye clinic.  I have made referrals, counseling and provided education to the patient based review of the above and I have provided the pt with a written personalized care plan for preventive services.  Provider list updated- see scanned forms.  Routine anticipatory guidance given to patient.  See health maintenance. The possibility exists that previously documented standard health maintenance information may have been brought forward from a previous encounter into this note.  If needed, that same information has been updated to reflect the current situation based on today's encounter.    Flu Shingles PNA Tetanus Colon  Prostate cancer screening Advance directive Cognitive function addressed- see scanned forms- and if abnormal then additional documentation follows.   In addition to [facility], follow up visit for the below conditions:  Hearing and vision screening done.  EKG in chart.   He is moving to [place] in the near future.  D/w pt.    Hyperglycemia. No DM by home check.  Usually ~100.  H/o gastric surgery and weight is down.    ED.  Used sildenafil prn.  No ADE on med.    He was able to play golf with his son and that was a big deal for the patient after his B knee replacement. 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  His mood is clearly better and he didn't have to take citalopram.  Swallowing well in the meantime.    Pacer per cards.    OSA per pulmonary.   He is going to have repeat sleep study done.  Compliant with use.    PMH and SH reviewed  Meds, vitals, and allergies reviewed.   ROS: Per HPI.  Unless specifically indicated otherwise in HPI, the patient denies:  General: fever. Eyes: acute vision changes ENT: sore throat Cardiovascular: chest pain Respiratory: SOB GI: vomiting GU: dysuria Musculoskeletal: acute back pain Derm: acute rash Neuro: acute motor dysfunction Psych: worsening mood Endocrine: polydipsia Heme: bleeding Allergy: hayfever  GEN: nad, alert and oriented HEENT: ncat NECK: supple w/o LA CV: rrr. PULM: ctab, no inc wob ABD: soft, +bs EXT: no edema SKIN: well perfused.

## 2022-10-11 NOTE — Assessment & Plan Note (Signed)
OSA per pulmonary.  He is going to have repeat sleep study done.  Compliant with CPAP use.

## 2022-10-11 NOTE — Assessment & Plan Note (Signed)
Hyperglycemia. No DM by home check.  Usually ~100.  H/o gastric surgery and weight is down.   See notes on labs.

## 2022-10-11 NOTE — Assessment & Plan Note (Signed)
Flu prev done.  Shingles prev done.  PNA deferred given recent RSV vaccine.  D/w pt.  Tetanus 2018. RSV vaccine previously done. COVID vaccine previously done Colonoscopy 2024 Prostate cancer screening pending 2024. Advance directive-wife designated if patient were incapacitated. Cognitive function addressed- see scanned forms- and if abnormal then additional documentation follows.

## 2022-10-11 NOTE — Assessment & Plan Note (Signed)
His mood is clearly better and he didn't have to take citalopram.  His functional status is significantly better after his knee surgery.

## 2022-10-11 NOTE — Assessment & Plan Note (Signed)
Advance directive- wife designated if patient were incapacitated.  

## 2022-10-11 NOTE — Assessment & Plan Note (Signed)
Swallowing well in the meantime.  He can update GI as needed.

## 2022-10-11 NOTE — Assessment & Plan Note (Signed)
Used sildenafil prn.  No ADE on med.   Continue as needed sildenafil use.

## 2022-10-11 NOTE — Assessment & Plan Note (Signed)
Per cardiology No chest pain  

## 2022-10-13 NOTE — Telephone Encounter (Signed)
Patient called and said HST did not feel comfortable with the nasal canula. Pt is requesting to have a in lab sleep study completed since he had trouble completing the previous HST   Please call pt and advise

## 2022-10-13 NOTE — Telephone Encounter (Signed)
Please check with [facility].  I believe the plan was to get him set up for [facility] when we start using this in the next couple of weeks.

## 2022-10-16 ENCOUNTER — Ambulatory Visit (INDEPENDENT_AMBULATORY_CARE_PROVIDER_SITE_OTHER): Payer: Medicare PPO

## 2022-10-16 DIAGNOSIS — R001 Bradycardia, unspecified: Secondary | ICD-10-CM | POA: Diagnosis not present

## 2022-10-16 NOTE — Telephone Encounter (Signed)
Routing to PCC pool

## 2022-10-17 LAB — CUP PACEART REMOTE DEVICE CHECK
Battery Remaining Longevity: 67 mo
Battery Remaining Percentage: 63 %
Battery Voltage: 2.99 V
Brady Statistic AP VP Percent: 1 %
Brady Statistic AP VS Percent: 57 %
Brady Statistic AS VP Percent: 1 %
Brady Statistic AS VS Percent: 42 %
Brady Statistic RA Percent Paced: 56 %
Brady Statistic RV Percent Paced: 1 %
Date Time Interrogation Session: 20240520053218
Implantable Lead Connection Status: 753985
Implantable Lead Connection Status: 753985
Implantable Lead Implant Date: 20061219
Implantable Lead Implant Date: 20061219
Implantable Lead Location: 753859
Implantable Lead Location: 753860
Implantable Pulse Generator Implant Date: 20191204
Lead Channel Impedance Value: 1325 Ohm
Lead Channel Impedance Value: 460 Ohm
Lead Channel Pacing Threshold Amplitude: 1.125 V
Lead Channel Pacing Threshold Amplitude: 1.25 V
Lead Channel Pacing Threshold Pulse Width: 0.5 ms
Lead Channel Pacing Threshold Pulse Width: 0.8 ms
Lead Channel Sensing Intrinsic Amplitude: 5 mV
Lead Channel Sensing Intrinsic Amplitude: 8.3 mV
Lead Channel Setting Pacing Amplitude: 1.375
Lead Channel Setting Pacing Amplitude: 2.25 V
Lead Channel Setting Pacing Pulse Width: 0.5 ms
Lead Channel Setting Sensing Sensitivity: 2.5 mV
Pulse Gen Model: 2272
Pulse Gen Serial Number: 9090970

## 2022-10-19 NOTE — Telephone Encounter (Signed)
LM on pt's VM that we will reach out to to him next week to get him scheduled.

## 2022-11-09 ENCOUNTER — Encounter (HOSPITAL_COMMUNITY): Payer: Self-pay | Admitting: *Deleted

## 2022-11-13 NOTE — Progress Notes (Signed)
Remote pacemaker transmission.   

## 2022-11-15 NOTE — Telephone Encounter (Signed)
Patient is ready to move forward with watchpat, wants to complete in lab sleep study if it will be sooner. Please call patient back to schedule.

## 2022-11-20 ENCOUNTER — Ambulatory Visit: Payer: Medicare PPO | Admitting: Family Medicine

## 2022-11-22 ENCOUNTER — Other Ambulatory Visit: Payer: Self-pay | Admitting: Pulmonary Disease

## 2022-11-22 DIAGNOSIS — G4733 Obstructive sleep apnea (adult) (pediatric): Secondary | ICD-10-CM

## 2022-11-22 NOTE — Telephone Encounter (Signed)
Pt would like to complete his sleep study, says if in lab sleep can be done sooner please arrange, he's been waiting for watchpat.

## 2022-11-22 NOTE — Telephone Encounter (Signed)
I have put in an order for in lab sleep study.

## 2022-11-22 NOTE — Addendum Note (Signed)
Addended by: Coralyn Helling on: 11/22/2022 12:27 PM   Modules accepted: Orders

## 2022-12-18 ENCOUNTER — Ambulatory Visit: Payer: Medicare PPO | Admitting: Family Medicine

## 2022-12-22 ENCOUNTER — Ambulatory Visit (HOSPITAL_BASED_OUTPATIENT_CLINIC_OR_DEPARTMENT_OTHER): Payer: Medicare PPO | Attending: Pulmonary Disease | Admitting: Pulmonary Disease

## 2022-12-22 DIAGNOSIS — G4733 Obstructive sleep apnea (adult) (pediatric): Secondary | ICD-10-CM | POA: Diagnosis not present

## 2023-01-02 ENCOUNTER — Telehealth: Payer: Self-pay | Admitting: Pulmonary Disease

## 2023-01-02 DIAGNOSIS — G4733 Obstructive sleep apnea (adult) (pediatric): Secondary | ICD-10-CM

## 2023-01-02 NOTE — Telephone Encounter (Signed)
PSG 12/22/22 >> AHI 52.6, SpO2 low 89%.   Please inform him that his sleep study shows severe obstructive sleep apnea.  Please send order to have [employer] arrange for a new Bipap at 25/21 cm H2O with heated humidity.  He needs an ROV in 4 months

## 2023-01-02 NOTE — Procedures (Signed)
     Patient Name: Glenn Lyons, Glenn Lyons Date: 12/22/2022 Gender: Male D.O.B: Aug 09, 1955 Age (years): 40 Referring Provider: Coralyn Helling MD, ABSM Height (inches): 72 Interpreting Physician: Coralyn Helling MD, ABSM Weight (lbs): 304 RPSGT: Lowry Ram BMI: 41 MRN: [id number] Neck Size: 18.00  CLINICAL INFORMATION Sleep Study Type: NPSG  Indication for sleep study: History of obstructive sleep apnea treated with Bipap.  Insurance requires a repeat sleep study prior to authorizing a replacement Bipap machine.  Epworth Sleepiness Score: 0  SLEEP STUDY TECHNIQUE As per the AASM Manual for the Scoring of Sleep and Associated Events v2.3 (April 2016) with a hypopnea requiring 4% desaturations.  The channels recorded and monitored were frontal, central and occipital EEG, electrooculogram (EOG), submentalis EMG (chin), nasal and oral airflow, thoracic and abdominal wall motion, anterior tibialis EMG, snore microphone, electrocardiogram, and pulse oximetry.  MEDICATIONS Medications self-administered by patient taken the night of the study : N/A  SLEEP ARCHITECTURE The study was initiated at 9:52:42 PM and ended at 12:04:02 AM.  Sleep onset time was 6.8 minutes and the sleep efficiency was 46.1%. The total sleep time was 60.5 minutes.  Stage REM latency was N/A minutes.  The patient spent 39.7% of the night in stage N1 sleep, 60.3% in stage N2 sleep, 0.0% in stage N3 and 0% in REM.  Alpha intrusion was absent.  Supine sleep was 100.00%.  RESPIRATORY PARAMETERS The overall apnea/hypopnea index (AHI) was 52.6 per hour. There were 0 total apneas, including 0 obstructive, 0 central and 0 mixed apneas. There were 53 hypopneas and 40 RERAs.  The AHI during Stage REM sleep was N/A per hour.  AHI while supine was 52.6 per hour.  The mean oxygen saturation was 95.9%. The minimum SpO2 during sleep was 89.0%.  moderate snoring was noted during this study.  CARDIAC DATA The 2  lead EKG demonstrated pacemaker generated. The mean heart rate was 59.9 beats per minute. Other EKG findings include: None.  LEG MOVEMENT DATA The total PLMS were 0 with a resulting PLMS index of 0.0. Associated arousal with leg movement index was 0.0 .  IMPRESSIONS - Severe obstructive sleep apnea occurred during this study (AHI = 52.6/h). - The patient had minimal or no oxygen desaturation during the study (Min O2 = 89.0%) - The patient snored with moderate snoring volume. - No cardiac abnormalities were noted during this study. - Clinically significant periodic limb movements did not occur during sleep. No significant associated arousals. - He had trouble with sleep maintenance due to respiratory events.  DIAGNOSIS - Obstructive Sleep Apnea (G47.33)  RECOMMENDATIONS - He should continue Bipap therapy. - Avoid alcohol, sedatives and other CNS depressants that may worsen sleep apnea and disrupt normal sleep architecture. - Sleep hygiene should be reviewed to assess factors that may improve sleep quality. - Weight management and regular exercise should be initiated or continued if appropriate.  [Electronically signed] 01/02/2023 12:43 PM  Coralyn Helling MD, ABSM Diplomate, American Board of Sleep Medicine NPI: [id number]  [facility] PH: [phone]   FX: [phone] ACCREDITED BY THE AMERICAN ACADEMY OF SLEEP MEDICINE

## 2023-01-04 NOTE — Telephone Encounter (Signed)
Lm x1 for patient.  

## 2023-01-08 ENCOUNTER — Ambulatory Visit: Payer: Medicare PPO | Admitting: Family Medicine

## 2023-01-13 LAB — CUP PACEART REMOTE DEVICE CHECK
Battery Remaining Longevity: 50 mo
Battery Remaining Percentage: 61 %
Battery Voltage: 2.98 V
Brady Statistic AP VP Percent: 1 %
Brady Statistic AP VS Percent: 59 %
Brady Statistic AS VP Percent: 1 %
Brady Statistic AS VS Percent: 40 %
Brady Statistic RA Percent Paced: 58 %
Brady Statistic RV Percent Paced: 1 %
Date Time Interrogation Session: 20240817020014
Implantable Lead Connection Status: 753985
Implantable Lead Connection Status: 753985
Implantable Lead Implant Date: 20061219
Implantable Lead Implant Date: 20061219
Implantable Lead Location: 753859
Implantable Lead Location: 753860
Implantable Pulse Generator Implant Date: 20191204
Lead Channel Impedance Value: 450 Ohm
Lead Channel Impedance Value: 740 Ohm
Lead Channel Pacing Threshold Amplitude: 1.125 V
Lead Channel Pacing Threshold Amplitude: 1.75 V
Lead Channel Pacing Threshold Pulse Width: 0.5 ms
Lead Channel Pacing Threshold Pulse Width: 0.8 ms
Lead Channel Sensing Intrinsic Amplitude: 5 mV
Lead Channel Sensing Intrinsic Amplitude: 8.2 mV
Lead Channel Setting Pacing Amplitude: 1.375
Lead Channel Setting Pacing Amplitude: 5 V
Lead Channel Setting Pacing Pulse Width: 0.5 ms
Lead Channel Setting Sensing Sensitivity: 2.5 mV
Pulse Gen Model: 2272
Pulse Gen Serial Number: 9090970

## 2023-01-15 ENCOUNTER — Ambulatory Visit: Payer: BC Managed Care – PPO

## 2023-01-16 NOTE — Progress Notes (Unsigned)
    Glenn Treloar T. [name], MD, CAQ Sports Medicine [facility] [address] [place], [address]  Phone: [phone]  FAX: [phone]  Glenn Lyons - 67 y.o. male  MRN [id number]  Date of Birth: May 16, 1956  Date: 01/17/2023  PCP: Joaquim Nam, MD  Referral: Joaquim Nam, MD  No chief complaint on file.  Subjective:   Glenn Lyons is a 67 y.o. very pleasant male patient with There is no height or weight on file to calculate BMI. who presents with the following:  Glenn Lyons is a very pleasant 67 year old gentleman, and I recall him well from various office visits in the past.  Presents today with a question of possible cellulitis.    Review of Systems is noted in the HPI, as appropriate  Objective:   There were no vitals taken for this visit.  GEN: No acute distress; alert,appropriate. PULM: Breathing comfortably in no respiratory distress PSYCH: Normally interactive.   Laboratory and Imaging Data:  Assessment and Plan:   ***

## 2023-01-17 ENCOUNTER — Encounter: Payer: Self-pay | Admitting: Family Medicine

## 2023-01-17 ENCOUNTER — Ambulatory Visit: Payer: Medicare PPO | Admitting: Family Medicine

## 2023-01-17 VITALS — BP 152/70 | HR 59 | Temp 97.8°F | Ht 71.25 in | Wt 310.2 lb

## 2023-01-17 DIAGNOSIS — L03116 Cellulitis of left lower limb: Secondary | ICD-10-CM

## 2023-01-17 MED ORDER — CEPHALEXIN 500 MG PO CAPS: 500.0000 mg | ORAL_CAPSULE | Freq: Three times a day (TID) | ORAL | 0 refills | Status: DC

## 2023-01-23 ENCOUNTER — Other Ambulatory Visit: Payer: Self-pay | Admitting: Family Medicine

## 2023-01-23 MED ORDER — CEPHALEXIN 500 MG PO CAPS: 500.0000 mg | ORAL_CAPSULE | Freq: Three times a day (TID) | ORAL | 0 refills | Status: DC

## 2023-01-25 ENCOUNTER — Other Ambulatory Visit: Payer: Self-pay | Admitting: Pulmonary Disease

## 2023-01-25 ENCOUNTER — Encounter: Payer: Self-pay | Admitting: *Deleted

## 2023-01-25 DIAGNOSIS — G4733 Obstructive sleep apnea (adult) (pediatric): Secondary | ICD-10-CM

## 2023-01-25 NOTE — Telephone Encounter (Signed)
Called the pt and there was no answer- LMTCB and will msg him via [employer]

## 2023-02-02 ENCOUNTER — Ambulatory Visit: Payer: Medicare PPO | Admitting: Family Medicine

## 2023-02-02 ENCOUNTER — Encounter: Payer: Self-pay | Admitting: Family Medicine

## 2023-02-02 VITALS — BP 138/66 | HR 77 | Temp 97.7°F | Ht 71.25 in | Wt 312.0 lb

## 2023-02-02 DIAGNOSIS — R21 Rash and other nonspecific skin eruption: Secondary | ICD-10-CM | POA: Diagnosis not present

## 2023-02-02 DIAGNOSIS — R4586 Emotional lability: Secondary | ICD-10-CM

## 2023-02-02 MED ORDER — VALACYCLOVIR HCL 1 G PO TABS: 1000.0000 mg | ORAL_TABLET | Freq: Three times a day (TID) | ORAL | 0 refills | Status: DC

## 2023-02-02 MED ORDER — BUSPIRONE HCL 5 MG PO TABS: 5.0000 mg | ORAL_TABLET | Freq: Two times a day (BID) | ORAL | 1 refills | Status: DC

## 2023-02-02 MED ORDER — CLOTRIMAZOLE-BETAMETHASONE 1-0.05 % EX CREA: 1.0000 | TOPICAL_CREAM | Freq: Two times a day (BID) | CUTANEOUS | 0 refills | Status: AC

## 2023-02-02 MED ORDER — DOXYCYCLINE HYCLATE 100 MG PO TABS: 100.0000 mg | ORAL_TABLET | Freq: Two times a day (BID) | ORAL | 0 refills | Status: DC

## 2023-02-02 NOTE — Patient Instructions (Addendum)
Start valtrex for presumed shingles.   Use lotrisone on the rash on your arm, anterior abdominal wall, and leg.    Finish the keflex.  If the rash on your leg doesn't get better then change to doxy.    Take care.  Glad to see you.  You could try taking buspar 5mg  twice a day after your rash is better.  Let me know how that goes.

## 2023-02-02 NOTE — Progress Notes (Unsigned)
Cellulitis  Saw Dr Patsy Lager 01-17-23 for area on left leg. Not completley healed on 2 rounds of Keflex.   Rash  Several rash areas: Right Forearm, right side of wasitband.  He ended up not moving to [place], is still local.  Son is still in [place].    We talked about mood options/anxiety.  He was asking about buspar use.  No SI/HI.    Meds, vitals, and allergies reviewed.   ROS: Per HPI unless specifically indicated in ROS section   GEN: nad, alert and oriented HEENT: ncat Skin with L lower leg rash blanching rash.  Less red now but not healed.    Now with itchy lesion on the R medial forearm over the last few days.  Tried OTC topicals without sig relief.    dermatomal rash on the R abd wall, at the pannus.  Blanches.  On th R side only, doesn't cross midline.  Altered sensation locally.

## 2023-02-04 DIAGNOSIS — R21 Rash and other nonspecific skin eruption: Secondary | ICD-10-CM | POA: Insufficient documentation

## 2023-02-04 NOTE — Assessment & Plan Note (Signed)
Multiple issues.  Could have a fungal infection on the left shin, especially since that has not fully responded to antibiotics.  He could also have a fungal infection on the right forearm.  He could have shingles on the abdominal wall.  The anterior portion of the rash looks like it could have an additional fungal component.  Start valtrex for presumed shingles.  Rationale discussed with patient.  Use lotrisone on the rash on arm, anterior abdominal wall, and leg.    Finish the keflex.  If the rash on the leg doesn't get better then change to doxy.  Routine cautions given patient he agreed.

## 2023-02-04 NOTE — Assessment & Plan Note (Signed)
He can try taking buspar 5mg  twice a day after rash is better.  He can let me know how that goes.

## 2023-02-07 DIAGNOSIS — Z950 Presence of cardiac pacemaker: Secondary | ICD-10-CM | POA: Diagnosis not present

## 2023-02-07 DIAGNOSIS — K220 Achalasia of cardia: Secondary | ICD-10-CM | POA: Diagnosis not present

## 2023-02-07 DIAGNOSIS — I10 Essential (primary) hypertension: Secondary | ICD-10-CM | POA: Diagnosis not present

## 2023-02-07 DIAGNOSIS — E1169 Type 2 diabetes mellitus with other specified complication: Secondary | ICD-10-CM | POA: Diagnosis not present

## 2023-02-07 DIAGNOSIS — Z9884 Bariatric surgery status: Secondary | ICD-10-CM | POA: Diagnosis not present

## 2023-02-07 DIAGNOSIS — G4733 Obstructive sleep apnea (adult) (pediatric): Secondary | ICD-10-CM | POA: Diagnosis not present

## 2023-02-07 DIAGNOSIS — E669 Obesity, unspecified: Secondary | ICD-10-CM | POA: Diagnosis not present

## 2023-02-12 NOTE — Telephone Encounter (Signed)
I called the pt and there was no answer- LMTCB. ?

## 2023-02-20 ENCOUNTER — Other Ambulatory Visit: Payer: Self-pay | Admitting: Family Medicine

## 2023-02-20 MED ORDER — BUSPIRONE HCL 5 MG PO TABS: 5.0000 mg | ORAL_TABLET | Freq: Two times a day (BID) | ORAL | 1 refills | Status: DC

## 2023-02-28 DIAGNOSIS — G4733 Obstructive sleep apnea (adult) (pediatric): Secondary | ICD-10-CM | POA: Diagnosis not present

## 2023-03-31 DIAGNOSIS — G4733 Obstructive sleep apnea (adult) (pediatric): Secondary | ICD-10-CM | POA: Diagnosis not present

## 2023-04-16 ENCOUNTER — Ambulatory Visit (INDEPENDENT_AMBULATORY_CARE_PROVIDER_SITE_OTHER): Payer: Medicare PPO

## 2023-04-16 DIAGNOSIS — I480 Paroxysmal atrial fibrillation: Secondary | ICD-10-CM

## 2023-04-16 DIAGNOSIS — R001 Bradycardia, unspecified: Secondary | ICD-10-CM

## 2023-04-17 LAB — CUP PACEART REMOTE DEVICE CHECK
Battery Remaining Longevity: 52 mo
Battery Remaining Percentage: 58 %
Battery Voltage: 2.98 V
Brady Statistic AP VP Percent: 1 %
Brady Statistic AP VS Percent: 59 %
Brady Statistic AS VP Percent: 1 %
Brady Statistic AS VS Percent: 40 %
Brady Statistic RA Percent Paced: 58 %
Brady Statistic RV Percent Paced: 1 %
Date Time Interrogation Session: 20241118020013
Implantable Lead Connection Status: 753985
Implantable Lead Connection Status: 753985
Implantable Lead Implant Date: 20061219
Implantable Lead Implant Date: 20061219
Implantable Lead Location: 753859
Implantable Lead Location: 753860
Implantable Pulse Generator Implant Date: 20191204
Lead Channel Impedance Value: 450 Ohm
Lead Channel Impedance Value: 740 Ohm
Lead Channel Pacing Threshold Amplitude: 1.25 V
Lead Channel Pacing Threshold Amplitude: 2.125 V
Lead Channel Pacing Threshold Pulse Width: 0.5 ms
Lead Channel Pacing Threshold Pulse Width: 0.8 ms
Lead Channel Sensing Intrinsic Amplitude: 5 mV
Lead Channel Sensing Intrinsic Amplitude: 8.6 mV
Lead Channel Setting Pacing Amplitude: 1.5 V
Lead Channel Setting Pacing Amplitude: 3.625
Lead Channel Setting Pacing Pulse Width: 0.5 ms
Lead Channel Setting Sensing Sensitivity: 2.5 mV
Pulse Gen Model: 2272
Pulse Gen Serial Number: 9090970

## 2023-04-30 DIAGNOSIS — G4733 Obstructive sleep apnea (adult) (pediatric): Secondary | ICD-10-CM | POA: Diagnosis not present

## 2023-05-09 NOTE — Progress Notes (Signed)
Remote pacemaker transmission.   

## 2023-05-10 ENCOUNTER — Ambulatory Visit: Payer: Medicare PPO | Admitting: Family Medicine

## 2023-05-10 ENCOUNTER — Encounter: Payer: Self-pay | Admitting: Family Medicine

## 2023-05-10 VITALS — BP 144/80 | HR 60 | Temp 97.6°F | Ht 71.25 in | Wt 318.8 lb

## 2023-05-10 DIAGNOSIS — L989 Disorder of the skin and subcutaneous tissue, unspecified: Secondary | ICD-10-CM | POA: Diagnosis not present

## 2023-05-10 DIAGNOSIS — Z23 Encounter for immunization: Secondary | ICD-10-CM

## 2023-05-10 DIAGNOSIS — R4586 Emotional lability: Secondary | ICD-10-CM

## 2023-05-10 MED ORDER — BUSPIRONE HCL 5 MG PO TABS: 5.0000 mg | ORAL_TABLET | Freq: Two times a day (BID) | ORAL | Status: DC

## 2023-05-10 NOTE — Patient Instructions (Addendum)
You could cut buspar back to 1 pill a day for 1 week, then stop.  You can see how you feel and update me as needed.  Take care.  Glad to see you.  Let me know which clinic you want to see.  The concern is that you could have a small basal cell cancer.

## 2023-05-10 NOTE — Progress Notes (Signed)
Skin lesions on the face.  Wanted evaluation.  See exam.  Higher BP noted today. D/w pt. he can update me if persistently elevated.  He was taking 5mg  buspar BID.  His mood is generally good but his focus may not be as good as prior- mild change noted by patient.  He also had a really busy work schedule and that could have contributed.  He was asking about stopping buspar.  Discussed trying to limit multitasking.  Discussed options.  Flu shot done at office visit.  Meds, vitals, and allergies reviewed.   ROS: Per HPI unless specifically indicated in ROS section   Nad Ncat R side of forehead, 3mm dark macule, benign-appearing Also with 3mm fleshy papule on the R cheek- concern for possible BCC.   Small 3mm likely SK on the R cheek, closer to the R ear.  Mild actinic changes on the forehead.

## 2023-05-13 DIAGNOSIS — L989 Disorder of the skin and subcutaneous tissue, unspecified: Secondary | ICD-10-CM | POA: Insufficient documentation

## 2023-05-13 NOTE — Assessment & Plan Note (Signed)
R side of forehead, 3mm dark macule, benign-appearing Also with 3mm fleshy papule on the R cheek- concern for possible BCC.   Small 3mm likely SK on the R cheek, closer to the R ear.  Mild actinic changes on the forehead.    Discussed with patient about options and I want to refer him to dermatology.  He is on let me know which clinic he wants to see specifically and I will put in the referral.    See MyChart message.

## 2023-05-13 NOTE — Assessment & Plan Note (Signed)
Can taper BuSpar and then stop.  Discussed limiting multitasking.  He can update me as needed.  Still okay for outpatient follow-up.

## 2023-05-25 NOTE — Telephone Encounter (Signed)
Received fax from [facility]. Patient has not been scheduled. He has not filled out paperwork as requested by office twice. Placed form in your box for review.

## 2023-05-25 NOTE — Telephone Encounter (Signed)
Noted. Thanks.  I'll check the hard copy.

## 2023-05-31 DIAGNOSIS — G4733 Obstructive sleep apnea (adult) (pediatric): Secondary | ICD-10-CM | POA: Diagnosis not present

## 2023-06-05 ENCOUNTER — Encounter: Payer: Self-pay | Admitting: Family Medicine

## 2023-06-06 ENCOUNTER — Other Ambulatory Visit: Payer: Self-pay | Admitting: Family Medicine

## 2023-06-06 MED ORDER — CEPHALEXIN 500 MG PO CAPS: 500.0000 mg | ORAL_CAPSULE | Freq: Three times a day (TID) | ORAL | 0 refills | Status: DC

## 2023-06-08 DIAGNOSIS — D225 Melanocytic nevi of trunk: Secondary | ICD-10-CM | POA: Diagnosis not present

## 2023-06-08 DIAGNOSIS — Z85828 Personal history of other malignant neoplasm of skin: Secondary | ICD-10-CM | POA: Diagnosis not present

## 2023-06-08 DIAGNOSIS — D485 Neoplasm of uncertain behavior of skin: Secondary | ICD-10-CM | POA: Diagnosis not present

## 2023-06-08 DIAGNOSIS — L821 Other seborrheic keratosis: Secondary | ICD-10-CM | POA: Diagnosis not present

## 2023-06-08 DIAGNOSIS — C44619 Basal cell carcinoma of skin of left upper limb, including shoulder: Secondary | ICD-10-CM | POA: Diagnosis not present

## 2023-06-08 DIAGNOSIS — L570 Actinic keratosis: Secondary | ICD-10-CM | POA: Diagnosis not present

## 2023-06-08 DIAGNOSIS — L218 Other seborrheic dermatitis: Secondary | ICD-10-CM | POA: Diagnosis not present

## 2023-06-13 ENCOUNTER — Telehealth: Payer: Self-pay | Admitting: Family Medicine

## 2023-06-13 NOTE — Telephone Encounter (Signed)
 Copied from CRM [id number]. Topic: Clinical - Medical Advice >> Jun 13, 2023  2:18 PM Glenn Lyons wrote: Reason for CRM: Redness swelling R ankle, diagnosed with Cellulitis x1 week ago, medication almost gone and PCP advised patient to call and report if not getting better. [phone]

## 2023-06-13 NOTE — Telephone Encounter (Signed)
 If worsen in  the meantime, would get seen at earlier appointment or UC.  Thanks.

## 2023-06-13 NOTE — Telephone Encounter (Signed)
 Patient is scheduled to be seen on 06/18/23

## 2023-06-13 NOTE — Telephone Encounter (Signed)
 He needs in person eval.

## 2023-06-18 ENCOUNTER — Ambulatory Visit: Payer: Medicare PPO | Admitting: Family Medicine

## 2023-06-18 ENCOUNTER — Encounter: Payer: Self-pay | Admitting: Family Medicine

## 2023-06-18 VITALS — BP 156/80 | HR 60 | Temp 97.8°F | Ht 71.25 in | Wt 321.0 lb

## 2023-06-18 DIAGNOSIS — R609 Edema, unspecified: Secondary | ICD-10-CM

## 2023-06-18 DIAGNOSIS — R739 Hyperglycemia, unspecified: Secondary | ICD-10-CM | POA: Diagnosis not present

## 2023-06-18 LAB — POCT GLYCOSYLATED HEMOGLOBIN (HGB A1C): Hemoglobin A1C: 6.2 % — AB (ref 4.0–5.6)

## 2023-06-18 MED ORDER — CEPHALEXIN 500 MG PO CAPS: 500.0000 mg | ORAL_CAPSULE | Freq: Three times a day (TID) | ORAL | 0 refills | Status: DC

## 2023-06-18 NOTE — Progress Notes (Unsigned)
F/u re: cellulitis. Done with keflex course.  Skin changes are better in the meantime.  It never got as angry as prev episode 6 months ago.  R>L ankle.    D/w pt about restarting compression stockings.  He has been off diet in the meantime.  Recheck A1c today, d/w pt at OV.   H/o nasal congestion at baseline.    Meds, vitals, and allergies reviewed.   ROS: Per HPI unless specifically indicated in ROS section   Nad Ncat Neck supple, no LA Rrr Ctab Trace BLE edema.  No active erythema.    A1c 6.2.

## 2023-06-18 NOTE — Patient Instructions (Addendum)
Restart using compression stockings in the meantime.  If any worsening, then restart keflex.  Take care.  Glad to see you. Check your BP at home and let me know if persistent above 140/90.

## 2023-06-20 NOTE — Assessment & Plan Note (Signed)
Cellulitis improved. D/w pt about restarting compression stockings in the meantime.  If any worsening, then restart keflex.  Asked pt to Check BP at home and let me know if persistently above 140/90.

## 2023-07-01 DIAGNOSIS — G4733 Obstructive sleep apnea (adult) (pediatric): Secondary | ICD-10-CM | POA: Diagnosis not present

## 2023-07-02 DIAGNOSIS — Z85828 Personal history of other malignant neoplasm of skin: Secondary | ICD-10-CM | POA: Diagnosis not present

## 2023-07-02 DIAGNOSIS — D485 Neoplasm of uncertain behavior of skin: Secondary | ICD-10-CM | POA: Diagnosis not present

## 2023-07-02 DIAGNOSIS — L988 Other specified disorders of the skin and subcutaneous tissue: Secondary | ICD-10-CM | POA: Diagnosis not present

## 2023-07-15 ENCOUNTER — Telehealth: Payer: Self-pay

## 2023-07-15 NOTE — Telephone Encounter (Signed)
[employer] request:   Alert remote transmission: HVR episode.  1 HVR classified episode on 07/12/23 at 6:03 pm, 6 min 56 sec, 1:1 atrial driven tachycardia at 159 bpm; repetitive alert, routed to clinic t consider increasing HVR detection from 150 bpm to > 164 bpm at next reprogramming session.  Follow up as scheduled. [facility], [facility]  Patient is past due for his annual follow up with Dr. Graciela Husbands.  Forwarding to EP scheduling team to get in next available.

## 2023-07-16 ENCOUNTER — Ambulatory Visit (INDEPENDENT_AMBULATORY_CARE_PROVIDER_SITE_OTHER): Payer: BC Managed Care – PPO

## 2023-07-16 DIAGNOSIS — I480 Paroxysmal atrial fibrillation: Secondary | ICD-10-CM

## 2023-07-17 LAB — CUP PACEART REMOTE DEVICE CHECK
Battery Remaining Longevity: 55 mo
Battery Remaining Percentage: 55 %
Battery Voltage: 2.98 V
Brady Statistic AP VP Percent: 1.2 %
Brady Statistic AP VS Percent: 60 %
Brady Statistic AS VP Percent: 1 %
Brady Statistic AS VS Percent: 39 %
Brady Statistic RA Percent Paced: 59 %
Brady Statistic RV Percent Paced: 1.3 %
Date Time Interrogation Session: 20250217020012
Implantable Lead Connection Status: 753985
Implantable Lead Connection Status: 753985
Implantable Lead Implant Date: 20061219
Implantable Lead Implant Date: 20061219
Implantable Lead Location: 753859
Implantable Lead Location: 753860
Implantable Pulse Generator Implant Date: 20191204
Lead Channel Impedance Value: 1150 Ohm
Lead Channel Impedance Value: 460 Ohm
Lead Channel Pacing Threshold Amplitude: 1.25 V
Lead Channel Pacing Threshold Amplitude: 2.125 V
Lead Channel Pacing Threshold Pulse Width: 0.5 ms
Lead Channel Pacing Threshold Pulse Width: 0.8 ms
Lead Channel Sensing Intrinsic Amplitude: 5 mV
Lead Channel Sensing Intrinsic Amplitude: 9 mV
Lead Channel Setting Pacing Amplitude: 1.5 V
Lead Channel Setting Pacing Amplitude: 3.625
Lead Channel Setting Pacing Pulse Width: 0.5 ms
Lead Channel Setting Sensing Sensitivity: 2.5 mV
Pulse Gen Model: 2272
Pulse Gen Serial Number: 9090970

## 2023-07-27 ENCOUNTER — Encounter: Payer: Self-pay | Admitting: Family Medicine

## 2023-07-29 DIAGNOSIS — G4733 Obstructive sleep apnea (adult) (pediatric): Secondary | ICD-10-CM | POA: Diagnosis not present

## 2023-07-30 ENCOUNTER — Encounter: Payer: Self-pay | Admitting: Family Medicine

## 2023-07-30 DIAGNOSIS — Z111 Encounter for screening for respiratory tuberculosis: Secondary | ICD-10-CM | POA: Diagnosis not present

## 2023-08-02 DIAGNOSIS — Z111 Encounter for screening for respiratory tuberculosis: Secondary | ICD-10-CM | POA: Diagnosis not present

## 2023-08-08 NOTE — Progress Notes (Unsigned)
  Cardiology Office Note:  .   Date:  08/08/2023  ID:  Glenn Lyons, DOB [birth year], MRN [id number] PCP: Joaquim Nam, MD  [facility] Providers Cardiologist:  Sherryl Manges, MD {  History of Present Illness: .   Glenn Lyons is a 68 y.o. male w/PMHx of  DM, esophageal stricture (hx of dilation) Morbid obesity (hx of bariatric surgery) OSA SND w/PPM HFpEF  Last seen 09/16/21 by Dr. Graciela Husbands,  HFpEF with stable volume status, off diuretics, discussed variability in A lead impedance, may be  impending lead failure but at that time sensing and pacing parameters are fine. We will follow that along.  Limited by orthopedic issues  Had a tele visit with cards APP for pre-op purposes, pending knee surgery. Was doing well, felt reasonable cardiac candidate for surgery  Today's visit is scheduled as an over-due annual visit  ROS:   He is doing great! 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 Has gone from parking as close as he could to getting thousands of steps in daily.  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 Nuclear stress EF: 63%. There was no ST segment deviation noted during stress. The study is normal. This is a low risk study.    03/13/17: TTE Study  Conclusions - Left ventricle: The cavity size was normal. Wall thickness was   increased in a pattern of moderate LVH. Systolic function was   normal. The estimated ejection fraction was in the range of 60%   to 65%. Features are consistent with a pseudonormal left   ventricular filling pattern, with concomitant abnormal relaxation   and increased filling pressure (grade 2 diastolic dysfunction). - Right atrium: The atrium was mildly dilated. Impressions: - Extremely technically difficult echo .   Risk Assessment/Calculations:    Physical Exam:   VS:  There were no vitals taken for this visit.   Wt Readings from Last 3 Encounters:  06/18/23 (!) 321 lb (145.6 kg)  05/10/23 (!) 318 lb 12.8 oz (144.6 kg)  02/02/23 (!) 312 lb (141.5 kg)    GEN: Well nourished, well developed in no acute distress NECK: No JVD; No carotid bruits CARDIAC: RRR, no murmurs, rubs, gallops RESPIRATORY:  CTA b/l without rales, wheezing or rhonchi  ABDOMEN: Soft, non-tender, non-distended EXTREMITIES:  No edema; No deformity   PPM site: (R sided) is stable, no thinning, fluctuation, tethering  ASSESSMENT AND PLAN: .    PPM intact function no programming changes made  HTN Typically better Had a particularly salty breakfast Discussed/cautioned  HFpEF No symptoms or exam findings of volume OL EKG updated, stable  Dispo: remotes as usual, in clinic again in a year, sooner if needed  Signed, Sheilah Pigeon, PA-C

## 2023-08-10 ENCOUNTER — Ambulatory Visit: Payer: Medicare PPO | Attending: Physician Assistant | Admitting: Physician Assistant

## 2023-08-10 ENCOUNTER — Encounter: Payer: Self-pay | Admitting: Physician Assistant

## 2023-08-10 VITALS — BP 146/82 | HR 64 | Ht 71.25 in | Wt 320.0 lb

## 2023-08-10 DIAGNOSIS — I10 Essential (primary) hypertension: Secondary | ICD-10-CM | POA: Diagnosis not present

## 2023-08-10 DIAGNOSIS — I5032 Chronic diastolic (congestive) heart failure: Secondary | ICD-10-CM

## 2023-08-10 DIAGNOSIS — Z950 Presence of cardiac pacemaker: Secondary | ICD-10-CM | POA: Diagnosis not present

## 2023-08-10 LAB — CUP PACEART INCLINIC DEVICE CHECK
Battery Remaining Longevity: 64 mo
Battery Voltage: 2.98 V
Brady Statistic RA Percent Paced: 60 %
Brady Statistic RV Percent Paced: 1.4 %
Date Time Interrogation Session: 20250314135042
Implantable Lead Connection Status: 753985
Implantable Lead Connection Status: 753985
Implantable Lead Implant Date: 20061219
Implantable Lead Implant Date: 20061219
Implantable Lead Location: 753859
Implantable Lead Location: 753860
Implantable Pulse Generator Implant Date: 20191204
Lead Channel Impedance Value: 1825 Ohm
Lead Channel Impedance Value: 525 Ohm
Lead Channel Pacing Threshold Amplitude: 1.375 V
Lead Channel Pacing Threshold Amplitude: 2.125 V
Lead Channel Pacing Threshold Pulse Width: 0.5 ms
Lead Channel Pacing Threshold Pulse Width: 0.8 ms
Lead Channel Sensing Intrinsic Amplitude: 5 mV
Lead Channel Sensing Intrinsic Amplitude: 9.1 mV
Lead Channel Setting Pacing Amplitude: 1.625
Lead Channel Setting Pacing Amplitude: 3.625
Lead Channel Setting Pacing Pulse Width: 0.5 ms
Lead Channel Setting Sensing Sensitivity: 2.5 mV
Pulse Gen Model: 2272
Pulse Gen Serial Number: 9090970

## 2023-08-10 NOTE — Patient Instructions (Addendum)
 Medication Instructions:   Your physician recommends that you continue on your current medications as directed. Please refer to the Current Medication list given to you today.   *If you need a refill on your cardiac medications before your next appointment, please call your pharmacy*   Lab Work: NONE ORDERED  TODAY    If you have labs (blood work) drawn today and your tests are completely normal, you will receive your results only by: MyChart Message (if you have MyChart) OR A paper copy in the mail If you have any lab test that is abnormal or we need to change your treatment, we will call you to review the results.   Testing/Procedures: NONE ORDERED  TODAY     Follow-Up: At [facility], you and your health needs are our priority.  As part of our continuing mission to provide you with exceptional heart care, we have created designated Provider Care Teams.  These Care Teams include your primary Cardiologist (physician) and Advanced Practice Providers (APPs -  Physician Assistants and Nurse Practitioners) who all work together to provide you with the care you need, when you need it.  We recommend signing up for the patient portal called "MyChart".  Sign up information is provided on this After Visit Summary.  MyChart is used to connect with patients for Virtual Visits (Telemedicine).  Patients are able to view lab/test results, encounter notes, upcoming appointments, etc.  Non-urgent messages can be sent to your provider as well.   To learn more about what you can do with MyChart, go to [email or web address].    Your next appointment:    1 year(s)    Provider:    You may see or one of the following Advanced Practice Providers on your designated Care Team:   Francis Dowse, [place]    Other Instructions

## 2023-08-14 ENCOUNTER — Encounter: Payer: Self-pay | Admitting: Internal Medicine

## 2023-08-22 NOTE — Progress Notes (Signed)
 Remote pacemaker transmission.

## 2023-08-22 NOTE — Addendum Note (Signed)
 Addended by: Geralyn Flash D on: 08/22/2023 11:40 AM   Modules accepted: Orders

## 2023-08-29 DIAGNOSIS — G4733 Obstructive sleep apnea (adult) (pediatric): Secondary | ICD-10-CM | POA: Diagnosis not present

## 2023-09-06 ENCOUNTER — Telehealth: Payer: Self-pay | Admitting: Family Medicine

## 2023-09-06 MED ORDER — AMOXICILLIN 500 MG PO TABS: 2000.0000 mg | ORAL_TABLET | Freq: Once | ORAL | 0 refills | Status: DC

## 2023-09-06 NOTE — Telephone Encounter (Signed)
 Spoke with patient and his dentist appointment was rescheduled to May because he had not taken amoxicillian. He would like to know if he can have a few sent in for him to take when he has dentist appointments which is about every 6 months.

## 2023-09-06 NOTE — Telephone Encounter (Signed)
 Sent. Thanks.

## 2023-09-06 NOTE — Telephone Encounter (Signed)
 Copied from CRM [id number]. Topic: Clinical - Medication Question >> Sep 06, 2023 10:58 AM [place] J wrote: Reason for CRM: Patient was wondering if he could get at least one tablet of amoxicillin prescribed before his dentist appointment today that's scheduled at 1:00PM.

## 2023-09-06 NOTE — Addendum Note (Signed)
 Addended by: Joaquim Nam on: 09/06/2023 05:03 PM   Modules accepted: Orders

## 2023-09-07 MED ORDER — AMOXICILLIN 500 MG PO TABS: 2000.0000 mg | ORAL_TABLET | Freq: Once | ORAL | Status: AC

## 2023-09-07 NOTE — Telephone Encounter (Signed)
 Patient notified

## 2023-09-07 NOTE — Addendum Note (Signed)
 Addended by: Joaquim Nam on: 09/07/2023 08:51 AM   Modules accepted: Orders

## 2023-09-07 NOTE — Telephone Encounter (Signed)
 Not showing ordered

## 2023-09-07 NOTE — Telephone Encounter (Signed)
 Should be on med list now.  E-Prescribing Status: Receipt confirmed by pharmacy (09/06/2023  5:03 PM EDT)

## 2023-09-22 ENCOUNTER — Encounter: Payer: Self-pay | Admitting: Family Medicine

## 2023-09-24 NOTE — Telephone Encounter (Signed)
 Patient last seen in office 06/18/23

## 2023-09-27 ENCOUNTER — Telehealth: Payer: Self-pay | Admitting: Family Medicine

## 2023-09-27 NOTE — Telephone Encounter (Signed)
 Please triage patient re: mood and potential buspar  use.  Thanks.

## 2023-09-27 NOTE — Telephone Encounter (Signed)
 I spoke with pt; pt said he did come off buspar  5 mg taking bid Dec 2024 because pt said he did not feel like himself like pt was too passive. Pt said now is basically retired but trying  to stay busy and productive. Pt said his mood is fine except sometimes he gets frustrated easily especially with his wife and  pt thinks retrying buspar  would be beneficial. Pt said he is actually hyper focused and does do multitasking because he is in a creative mood. No SI/Hi.pt request med sent to [facility], sending note to Dr Johnney Nam.

## 2023-09-27 NOTE — Telephone Encounter (Signed)
 Unable to reach pt by phone and left v/m requestin pt to call [phone] for triage.sending note to [facility] triage

## 2023-09-28 DIAGNOSIS — G4733 Obstructive sleep apnea (adult) (pediatric): Secondary | ICD-10-CM | POA: Diagnosis not present

## 2023-09-28 MED ORDER — BUSPIRONE HCL 5 MG PO TABS: 5.0000 mg | ORAL_TABLET | Freq: Two times a day (BID) | ORAL | 1 refills | Status: DC

## 2023-09-28 NOTE — Telephone Encounter (Signed)
 Noted.  I sent the prescription.  MyChart message sent to patient.

## 2023-10-11 ENCOUNTER — Ambulatory Visit

## 2023-10-11 VITALS — BP 146/82 | Ht 71.25 in | Wt 321.0 lb

## 2023-10-11 DIAGNOSIS — Z0000 Encounter for general adult medical examination without abnormal findings: Secondary | ICD-10-CM | POA: Diagnosis not present

## 2023-10-11 NOTE — Patient Instructions (Signed)
 Mr. Glenn Lyons , Thank you for taking time out of your busy schedule to complete your Annual Wellness Visit with me. I enjoyed our conversation and look forward to speaking with you again next year. I, as well as your care team,  appreciate your ongoing commitment to your health goals. Please review the following plan we discussed and let me know if I can assist you in the future. Your Game plan/ To Do List    Referrals: If you haven't heard from the office you've been referred to, please reach out to them at the phone provided.   Follow up Visits: Next Medicare AWV with our clinical staff: 10/15/2023   Have you seen your provider in the last 6 months (3 months if uncontrolled diabetes)? Yes Next Office Visit with your provider: n/A  Clinician Recommendations:  Aim for 30 minutes of exercise or brisk walking, 6-8 glasses of water, and 5 servings of fruits and vegetables each day.       This is a list of the screening recommended for you and due dates:  Health Maintenance  Topic Date Due   Yearly kidney health urinalysis for diabetes  01/25/2021   COVID-19 Vaccine (6 - 2024-25 season) 01/28/2023   Yearly kidney function blood test for diabetes  10/10/2023   Pneumonia Vaccine (2 of 2 - PCV) 06/17/2024*   Flu Shot  12/28/2023   Medicare Annual Wellness Visit  10/10/2024   Colon Cancer Screening  08/30/2025   DTaP/Tdap/Td vaccine (3 - Td or Tdap) 07/27/2033   Hepatitis C Screening  Completed   Zoster (Shingles) Vaccine  Completed   HPV Vaccine  Aged Out   Meningitis B Vaccine  Aged Out  *Topic was postponed. The date shown is not the original due date.    Advanced directives: (Declined) Advance directive discussed with you today. Even though you declined this today, please call our office should you change your mind, and we can give you the proper paperwork for you to fill out. Advance Care Planning is important because it:  [x]  Makes sure you receive the medical care that is consistent  with your values, goals, and preferences  [x]  It provides guidance to your family and loved ones and reduces their decisional burden about whether or not they are making the right decisions based on your wishes.  Follow the link provided in your after visit summary or read over the paperwork we have mailed to you to help you started getting your Advance Directives in place. If you need assistance in completing these, please reach out to us  so that we can help you!  See attachments for Preventive Care and Fall Prevention Tips.

## 2023-10-11 NOTE — Progress Notes (Signed)
 Because this visit was a virtual/telehealth visit,  certain criteria was not obtained, such a blood pressure, CBG if applicable, and timed get up and go. Any medications not marked as "taking" were not mentioned during the medication reconciliation part of the visit. Any vitals not documented were not able to be obtained due to this being a telehealth visit or patient was unable to self-report a recent blood pressure reading due to a lack of equipment at home via telehealth. Vitals that have been documented are verbally provided by the patient.   This visit was performed by a medical professional under my direct supervision. I was immediately available for consultation/collaboration. I have reviewed and agree with the Annual Wellness Visit documentation.  Subjective:   Glenn Lyons is a 68 y.o. who presents for a Medicare Wellness preventive visit.  As a reminder, Annual Wellness Visits don't include a physical exam, and some assessments may be limited, especially if this visit is performed virtually. We may recommend an in-person visit if needed.  Visit Complete: Virtual I connected with  Glenn Lyons on 10/11/23 by a audio enabled telemedicine application and verified that I am speaking with the correct person using two identifiers.  Patient Location: Home  Provider Location: Home Office  I discussed the limitations of evaluation and management by telemedicine. The patient expressed understanding and agreed to proceed.  Vital Signs: Because this visit was a virtual/telehealth visit, some criteria may be missing or patient reported. Any vitals not documented were not able to be obtained and vitals that have been documented are patient reported.  VideoDeclined- This patient declined Librarian, academic. Therefore the visit was completed with audio only.  Persons Participating in Visit: Patient.  AWV Questionnaire: No: Patient Medicare AWV questionnaire was not  completed prior to this visit.  Cardiac Risk Factors include: advanced age (>71men, >75 women);obesity (BMI >30kg/m2);male gender;hypertension     Objective:     Today's Vitals   10/11/23 1512  BP: (!) 146/82  Weight: (!) 321 lb (145.6 kg)  Height: 5' 11.25" (1.81 m)   Body mass index is 44.46 kg/m.     10/11/2023    3:17 PM 12/22/2022    8:28 PM 04/24/2022    5:47 AM 04/17/2022   11:11 AM 08/15/2021    5:06 PM 08/12/2021   11:22 AM 04/18/2021   11:23 AM  Advanced Directives  Does Patient Have a Medical Advance Directive? No No No No Yes Yes No  Type of Agricultural consultant;Living will Healthcare Power of [place];Living will   Does patient want to make changes to medical advance directive?     No - Patient declined    Copy of Healthcare Power of Attorney in Chart?     No - copy requested No - copy requested   Would patient like information on creating a medical advance directive? No - Patient declined Yes (MAU/Ambulatory/Procedural Areas - Information given) No - Patient declined Yes (MAU/Ambulatory/Procedural Areas - Information given)       Current Medications (verified) Outpatient Encounter Medications as of 10/11/2023  Medication Sig   acetaminophen  (TYLENOL ) 500 MG tablet Take 1,000 mg by mouth every 6 (six) hours as needed for moderate pain.   Aromatic Inhalants (VICKS VAPOR INHALER IN) Place 1 spray into both nostrils daily as needed (congestion).   Blood Glucose Monitoring Suppl (ONETOUCH VERIO) w/Device KIT Inject 1 kit into the skin every morning. Use to test blood sugar  each day before breakfast and 2 hours after a meal for 2 weeks.  Diagnosis:  E11.9  Non-insulin  dependent.   busPIRone  (BUSPAR ) 5 MG tablet Take 1 tablet (5 mg total) by mouth 2 (two) times daily. If needed after 10 days, can increase to 2 tabs twice daily.   CALCIUM  PO Take 1,500 mg by mouth daily.   clotrimazole -betamethasone  (LOTRISONE ) cream Apply 1 Application  topically 2 (two) times daily.   glucose blood (ONETOUCH VERIO) test strip USE TO TEST BLOOD SUGAR ONCE DAILY OR AS INSTRUCTED   Lancets (ONETOUCH ULTRASOFT) lancets USE TO TEST BLOOD SUGAR ONCE DAILY OR AS NEEDED   Multiple Vitamins-Minerals (BARIATRIC MULTIVITAMINS/IRON) CAPS Take 2 tablets by mouth daily at 6 (six) AM. chewable   sildenafil  (VIAGRA ) 50 MG tablet Take 50 mg by mouth daily as needed for erectile dysfunction.   No facility-administered encounter medications on file as of 10/11/2023.    Allergies (verified) Morphine and Metformin  and related   History: Past Medical History:  Diagnosis Date   Achalasia    with prev eval at [facility].  No intervention as of 2012   Arthritis    Aspiration pneumonia (HCC)    Backache, unspecified    Cardiac pacemaker St. Jude    ERI 2008   Depressive disorder, not elsewhere classified    hx of   Diabetes (HCC)    no meds/diet controlled   Hypertension    NO meds   Morbid obesity (HCC)    Obstructive sleep apnea    biapap settign 25-22   Presence of permanent cardiac pacemaker    St. Jude- Dr. Rodolfo Clan follows -replacement 07-06-16   Sinus bradycardia /pauses    Stricture and stenosis of esophagus    Past Surgical History:  Procedure Laterality Date   BALLOON DILATION N/A 10/06/2015   Procedure: BALLOON DILATION;  Surgeon: Sergio Dandy, MD;  Location: [facility] ENDOSCOPY;  Service: Endoscopy;  Laterality: N/A;   BIOPSY  05/06/2018   Procedure: BIOPSY;  Surgeon: Brice Campi Albino Alu., MD;  Location[facility] ENDOSCOPY;  Service: Gastroenterology;;   COLONOSCOPY W/ POLYPECTOMY  11/01/2010   COLONOSCOPY WITH PROPOFOL  N/A 10/05/2014   Procedure: COLONOSCOPY WITH PROPOFOL ;  Surgeon: Kenney Peacemaker, MD;  Location: [facility] ENDOSCOPY;  Service: Endoscopy;  Laterality: N/A;   COLONOSCOPY WITH PROPOFOL  N/A 05/06/2018   Procedure: COLONOSCOPY WITH PROPOFOL ;  Surgeon: Mansouraty, Albino Alu., MD;  Location: [facility] ENDOSCOPY;  Service: Gastroenterology;  Laterality: N/A;    ESOPHAGEAL DILATION     ESOPHAGEAL MANOMETRY N/A 08/23/2015   Procedure: ESOPHAGEAL MANOMETRY (EM);  Surgeon: Sergio Dandy, MD;  Location: [facility] ENDOSCOPY;  Service: Endoscopy;  Laterality: N/A;   ESOPHAGOGASTRODUODENOSCOPY N/A 10/09/2014   Procedure: ESOPHAGOGASTRODUODENOSCOPY (EGD);  Surgeon: Kenney Peacemaker, MD;  Location: [facility];  Service: Endoscopy;  Laterality: N/A;   ESOPHAGOGASTRODUODENOSCOPY (EGD) WITH PROPOFOL  N/A 08/23/2015   Procedure: ESOPHAGOGASTRODUODENOSCOPY (EGD) WITH PROPOFOL ;  Surgeon: Sergio Dandy, MD;  Location: [facility] ENDOSCOPY;  Service: Endoscopy;  Laterality: N/A;   ESOPHAGOGASTRODUODENOSCOPY (EGD) WITH PROPOFOL  N/A 10/06/2015   Procedure: ESOPHAGOGASTRODUODENOSCOPY (EGD) WITH PROPOFOL ;  Surgeon: Sergio Dandy, MD;  Location: [facility] ENDOSCOPY;  Service: Endoscopy;  Laterality: N/A;  Rigiflex ballon size 13mm-35mm size 45 minute proc,need Fluro Gastografin esophagram 2 hrs post EGD    GASTRIC ROUX-EN-Y N/A 04/05/2020   Procedure: LAPAROSCOPIC ROUX-EN-Y GASTRIC BYPASS WITH UPPER ENDOSCOPY;  Surgeon: Aldean Hummingbird, MD;  Location: [facility] ORS;  Service: General;  Laterality: N/A;   PACEMAKER INSERTION     POLYPECTOMY  05/06/2018   Procedure: POLYPECTOMY;  Surgeon: Brice Campi Albino Alu., MD;  Location: [facility] ENDOSCOPY;  Service: Gastroenterology;;   Darrol Emmer GENERATOR CHANGEOUT N/A 05/01/2018   Procedure: PPM GENERATOR CHANGEOUT;  Surgeon: Verona Goodwill, MD;  Location: [facility] INVASIVE CV LAB;  Service: Cardiovascular;  Laterality: N/A;   TOTAL KNEE ARTHROPLASTY Left 08/15/2021   Procedure: TOTAL KNEE ARTHROPLASTY;  Surgeon: Liliane Rei, MD;  Location: [facility] ORS;  Service: Orthopedics;  Laterality: Left;   TOTAL KNEE ARTHROPLASTY Right 04/24/2022   Procedure: TOTAL KNEE ARTHROPLASTY;  Surgeon: Liliane Rei, MD;  Location: [facility] ORS;  Service: Orthopedics;  Laterality: Right;   Family History  Problem Relation Age of Onset   Hypertension Mother    Dementia Mother    Prostate  cancer Father    Heart attack Father    Heart disease Father    Cancer Father        multiple myeloma   Hypertension Father    Colon cancer Neg Hx    Colon polyps Neg Hx    Esophageal cancer Neg Hx    Stomach cancer Neg Hx    Rectal cancer Neg Hx    Social History   Socioeconomic History   Marital status: Married    Spouse name: Not on file   Number of children: 2   Years of education: Not on file   Highest education level: Associate degree: occupational, Scientist, product/process development, or vocational program  Occupational History   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   Occupation: singer    Employer: [employer]  Tobacco Use   Smoking status: Former    Current packs/day: 0.00    Average packs/day: 1 pack/day for 20.0 years (20.0 ttl pk-yrs)    Types: Cigarettes    Start date: 05/29/1984    Quit date: 05/29/2004    Years since quitting: 19.3   Smokeless tobacco: Never  Vaping Use   Vaping status: Never Used  Substance and Sexual Activity   Alcohol use: Never   Drug use: No   Sexual activity: Yes  Other Topics Concern   Not on file  Social History Narrative   From Copywriter, advertising   Married   Social Drivers of Health   Financial Resource Strain: Patient Declined (06/17/2023)   Overall Financial Resource Strain (CARDIA)    Difficulty of Paying Living Expenses: Patient declined  Food Insecurity: Patient Declined (10/11/2023)   Hunger Vital Sign    Worried About Running Out of Food in the Last Year: Patient declined    Ran Out of Food in the Last Year: Patient declined  Transportation Needs: No Transportation Needs (10/11/2023)   PRAPARE - Administrator, Civil Service (Medical): No    Lack of Transportation (Non-Medical): No  Physical Activity: Insufficiently Active (10/11/2023)   Exercise Vital Sign    Days of Exercise per Week: 3 days    Minutes of Exercise per Session: 20 min  Stress: No Stress Concern Present (10/11/2023)   [employer] of Occupational Health -  Occupational Stress Questionnaire    Feeling of Stress : Not at all  Social Connections: Socially Integrated (10/11/2023)   Social Connection and Isolation Panel [NHANES]    Frequency of Communication with Friends and Family: More than three times a week    Frequency of Social Gatherings with Friends and Family: Once a week    Attends Religious Services: More than 4 times per year    Active Member of Clubs or Organizations: Yes    Attends  Engineer, structural: More than 4 times per year    Marital Status: Married    Tobacco Counseling Counseling given: Not Answered    Clinical Intake:  Pre-visit preparation completed: Yes  Pain : No/denies pain     Nutritional Risks: None Diabetes: No  Lab Results  Component Value Date   HGBA1C 6.2 (A) 06/18/2023   HGBA1C 6.0 10/10/2022   HGBA1C 6.2 (H) 04/17/2022     How often do you need to have someone help you when you read instructions, pamphlets, or other written materials from your doctor or pharmacy?: 1 - Never  Interpreter Needed?: No      Activities of Daily Living     10/11/2023    3:16 PM  In your present state of health, do you have any difficulty performing the following activities:  Hearing? 0  Vision? 0  Difficulty concentrating or making decisions? 0  Walking or climbing stairs? 0  Dressing or bathing? 0  Doing errands, shopping? 0  Preparing Food and eating ? N  Using the Toilet? N  In the past six months, have you accidently leaked urine? N  Do you have problems with loss of bowel control? N  Managing your Medications? N  Managing your Finances? N  Housekeeping or managing your Housekeeping? N    Patient Care Team: Donnie Galea, MD as PCP - General Verona Goodwill, MD as PCP - Cardiology (Cardiology)  Indicate any recent Medical Services you may have received from other than [facility] providers in the past year (date may be approximate).     Assessment:    This is a routine wellness  examination for Glenn Lyons.  Hearing/Vision screen Hearing Screening - Comments:: No hearing difficulties Vision Screening - Comments:: Wears readers   Goals Addressed             This Visit's Progress    Patient Stated       Patient would like to sing more        Depression Screen     10/11/2023    3:18 PM 05/10/2023    8:35 AM 10/10/2022   12:23 PM 04/04/2022    3:11 PM 04/13/2020    2:45 PM 10/16/2019   11:36 AM  PHQ 2/9 Scores  PHQ - 2 Score 0 0 0 0 0 0  PHQ- 9 Score 0 0 0 0      Fall Risk     10/11/2023    3:16 PM 06/18/2023    8:20 AM 05/10/2023    8:34 AM 10/10/2022   12:23 PM 04/13/2020    2:45 PM  Fall Risk   Falls in the past year? 0 0 0 0 0  Number falls in past yr: 0 0 0 0 0  Injury with Fall? 0 0 0 0 0  Risk for fall due to : No Fall Risks No Fall Risks No Fall Risks No Fall Risks   Follow up Falls prevention discussed;Falls evaluation completed Falls evaluation completed Falls evaluation completed Falls evaluation completed Falls evaluation completed    MEDICARE RISK AT HOME:  Medicare Risk at Home Any stairs in or around the home?: Yes If so, are there any without handrails?: No Home free of loose throw rugs in walkways, pet beds, electrical cords, etc?: Yes Adequate lighting in your home to reduce risk of falls?: Yes Life alert?: No Use of a cane, walker or w/c?: No Grab bars in the bathroom?: Yes Shower chair or bench in shower?:  Yes Elevated toilet seat or a handicapped toilet?: Yes  TIMED UP AND GO:  Was the test performed?  No  Cognitive Function: 6CIT completed        10/11/2023    3:14 PM  6CIT Screen  What Year? 0 points  What month? 0 points  What time? 0 points  Count back from 20 0 points  Months in reverse 0 points  Repeat phrase 0 points  Total Score 0 points    Immunizations Immunization History  Administered Date(s) Administered   Fluad Trivalent(High Dose 65+) 05/10/2023   Influenza Inj Mdck Quad Pf 02/26/2018,  03/08/2021   Influenza Split 02/20/2012   Influenza,inj,Quad PF,6+ Mos 03/10/2013, 05/19/2015, 03/21/2017, 03/08/2018, 01/23/2019, 02/20/2022   Influenza-Unspecified 05/12/2016, 01/04/2018, 02/29/2020   PFIZER Comirnaty(Gray Top)Covid-19 Tri-Sucrose Vaccine 10/04/2020   PFIZER(Purple Top)SARS-COV-2 Vaccination 08/09/2019, 08/30/2019, 03/14/2020   Pfizer Covid-19 Vaccine Bivalent Booster 1yrs & up 03/08/2021   Pneumococcal Polysaccharide-23 07/11/2016   RSV,unspecified 09/24/2022   Tdap 07/11/2016, 07/28/2023   Zoster Recombinant(Shingrix) 09/23/2019, 01/26/2020   Zoster, Live 03/18/2015    Screening Tests Health Maintenance  Topic Date Due   Diabetic kidney evaluation - Urine ACR  01/25/2021   COVID-19 Vaccine (6 - 2024-25 season) 01/28/2023   Diabetic kidney evaluation - eGFR measurement  10/10/2023   Pneumonia Vaccine 47+ Years old (2 of 2 - PCV) 06/17/2024 (Originally 07/11/2017)   INFLUENZA VACCINE  12/28/2023   Medicare Annual Wellness (AWV)  10/10/2024   Colonoscopy  08/30/2025   DTaP/Tdap/Td (3 - Td or Tdap) 07/27/2033   Hepatitis C Screening  Completed   Zoster Vaccines- Shingrix  Completed   HPV VACCINES  Aged Out   Meningococcal B Vaccine  Aged Out    Health Maintenance  Health Maintenance Due  Topic Date Due   Diabetic kidney evaluation - Urine ACR  01/25/2021   COVID-19 Vaccine (6 - 2024-25 season) 01/28/2023   Diabetic kidney evaluation - eGFR measurement  10/10/2023   Health Maintenance Items Addressed:declined health maintenance  Additional Screening:  Vision Screening: Recommended annual ophthalmology exams for early detection of glaucoma and other disorders of the eye.  Dental Screening: Recommended annual dental exams for proper oral hygiene  Community Resource Referral / Chronic Care Management: CRR required this visit?  No   CCM required this visit?  No   Plan:    I have personally reviewed and noted the following in the patient's chart:    Medical and social history Use of alcohol, tobacco or illicit drugs  Current medications and supplements including opioid prescriptions. Patient is not currently taking opioid prescriptions. Functional ability and status Nutritional status Physical activity Advanced directives List of other physicians Hospitalizations, surgeries, and ER visits in previous 12 months Vitals Screenings to include cognitive, depression, and falls Referrals and appointments  In addition, I have reviewed and discussed with patient certain preventive protocols, quality metrics, and best practice recommendations. A written personalized care plan for preventive services as well as general preventive health recommendations were provided to patient.   Glenn Lyons, [place]   10/11/2023   After Visit Summary: (MyChart) Due to this being a telephonic visit, the after visit summary with patients personalized plan was offered to patient via MyChart   Notes: Nothing significant to report at this time.

## 2023-10-15 ENCOUNTER — Ambulatory Visit: Admitting: Family Medicine

## 2023-10-15 ENCOUNTER — Ambulatory Visit (INDEPENDENT_AMBULATORY_CARE_PROVIDER_SITE_OTHER): Payer: BC Managed Care – PPO

## 2023-10-15 DIAGNOSIS — R001 Bradycardia, unspecified: Secondary | ICD-10-CM | POA: Diagnosis not present

## 2023-10-16 LAB — CUP PACEART REMOTE DEVICE CHECK
Battery Remaining Longevity: 46 mo
Battery Remaining Percentage: 53 %
Battery Voltage: 2.98 V
Brady Statistic AP VP Percent: 5 %
Brady Statistic AP VS Percent: 60 %
Brady Statistic AS VP Percent: 1 %
Brady Statistic AS VS Percent: 35 %
Brady Statistic RA Percent Paced: 64 %
Brady Statistic RV Percent Paced: 5 %
Date Time Interrogation Session: 20250519020014
Implantable Lead Connection Status: 753985
Implantable Lead Connection Status: 753985
Implantable Lead Implant Date: 20061219
Implantable Lead Implant Date: 20061219
Implantable Lead Location: 753859
Implantable Lead Location: 753860
Implantable Pulse Generator Implant Date: 20191204
Lead Channel Impedance Value: 450 Ohm
Lead Channel Impedance Value: 730 Ohm
Lead Channel Pacing Threshold Amplitude: 1.125 V
Lead Channel Pacing Threshold Amplitude: 2 V
Lead Channel Pacing Threshold Pulse Width: 0.5 ms
Lead Channel Pacing Threshold Pulse Width: 0.8 ms
Lead Channel Sensing Intrinsic Amplitude: 5 mV
Lead Channel Sensing Intrinsic Amplitude: 9.1 mV
Lead Channel Setting Pacing Amplitude: 1.375
Lead Channel Setting Pacing Amplitude: 3.5 V
Lead Channel Setting Pacing Pulse Width: 0.5 ms
Lead Channel Setting Sensing Sensitivity: 2.5 mV
Pulse Gen Model: 2272
Pulse Gen Serial Number: 9090970

## 2023-10-18 ENCOUNTER — Ambulatory Visit: Admitting: Family Medicine

## 2023-10-25 ENCOUNTER — Ambulatory Visit: Payer: Self-pay | Admitting: Cardiovascular Disease

## 2023-10-29 DIAGNOSIS — G4733 Obstructive sleep apnea (adult) (pediatric): Secondary | ICD-10-CM | POA: Diagnosis not present

## 2023-11-05 ENCOUNTER — Ambulatory Visit: Admitting: Family Medicine

## 2023-11-05 ENCOUNTER — Encounter: Payer: Self-pay | Admitting: Family Medicine

## 2023-11-05 VITALS — BP 154/82 | HR 60 | Temp 98.0°F | Ht 71.25 in | Wt 314.2 lb

## 2023-11-05 DIAGNOSIS — Z872 Personal history of diseases of the skin and subcutaneous tissue: Secondary | ICD-10-CM

## 2023-11-05 DIAGNOSIS — R739 Hyperglycemia, unspecified: Secondary | ICD-10-CM | POA: Diagnosis not present

## 2023-11-05 DIAGNOSIS — R4586 Emotional lability: Secondary | ICD-10-CM | POA: Diagnosis not present

## 2023-11-05 MED ORDER — CEPHALEXIN 500 MG PO CAPS: 500.0000 mg | ORAL_CAPSULE | Freq: Three times a day (TID) | ORAL | 0 refills | Status: DC

## 2023-11-05 NOTE — Patient Instructions (Addendum)
 Go to the lab on the way out.   If you have mychart we'll likely use that to update you.    Take care.  Glad to see you. I would start keflex  if any leg redness.  Limit carbs in the meantime.

## 2023-11-05 NOTE — Progress Notes (Unsigned)
 He was less irritable with buspar .  Taking 5mg  BID.  No SI/HI.    H/o hyperglycemia.  Recheck A1c pending.  Diet had been affected/altered recently.  He noted in abd girth with diet changes.    Concern re: cellulitis.  No rash yet but he had change in sensation in his legs.  H/o cellulitis in the past.  D/w pt about diet, BLE edema, weight. Fatigue noted.    Meds, vitals, and allergies reviewed.   ROS: Per HPI unless specifically indicated in ROS section

## 2023-11-06 LAB — COMPREHENSIVE METABOLIC PANEL WITH GFR
ALT: 16 U/L (ref 0–53)
AST: 15 U/L (ref 0–37)
Albumin: 4.1 g/dL (ref 3.5–5.2)
Alkaline Phosphatase: 84 U/L (ref 39–117)
BUN: 12 mg/dL (ref 6–23)
CO2: 25 meq/L (ref 19–32)
Calcium: 9.8 mg/dL (ref 8.4–10.5)
Chloride: 106 meq/L (ref 96–112)
Creatinine, Ser: 1.07 mg/dL (ref 0.40–1.50)
GFR: 72.4 mL/min (ref >60.00)
Glucose, Bld: 116 mg/dL — ABNORMAL HIGH (ref 70–99)
Potassium: 4.3 meq/L (ref 3.5–5.1)
Sodium: 139 meq/L (ref 135–145)
Total Bilirubin: 0.6 mg/dL (ref 0.2–1.2)
Total Protein: 6.6 g/dL (ref 6.0–8.3)

## 2023-11-06 LAB — HEMOGLOBIN A1C: Hgb A1c MFr Bld: 6.1 % (ref 4.6–6.5)

## 2023-11-07 DIAGNOSIS — Z872 Personal history of diseases of the skin and subcutaneous tissue: Secondary | ICD-10-CM | POA: Insufficient documentation

## 2023-11-07 DIAGNOSIS — R739 Hyperglycemia, unspecified: Secondary | ICD-10-CM | POA: Insufficient documentation

## 2023-11-07 NOTE — Assessment & Plan Note (Signed)
 Continue buspar  as is, 5mg  BID.  Could inc to 10mg  BID if needed.

## 2023-11-07 NOTE — Assessment & Plan Note (Signed)
 See notes on labs.  Limit carbs in the meantime.

## 2023-11-07 NOTE — Assessment & Plan Note (Signed)
 No rash currently.  I would start keflex  if any leg redness.  Limit carbs, continue work on diet.

## 2023-11-11 ENCOUNTER — Ambulatory Visit: Payer: Self-pay | Admitting: Family Medicine

## 2023-11-24 ENCOUNTER — Other Ambulatory Visit: Payer: Self-pay | Admitting: Family Medicine

## 2023-11-28 DIAGNOSIS — G4733 Obstructive sleep apnea (adult) (pediatric): Secondary | ICD-10-CM | POA: Diagnosis not present

## 2023-12-03 NOTE — Addendum Note (Signed)
 Addended by: TAWNI DRILLING D on: 12/03/2023 12:44 PM   Modules accepted: Orders

## 2023-12-03 NOTE — Progress Notes (Signed)
 Remote pacemaker transmission.

## 2023-12-17 ENCOUNTER — Telehealth: Payer: Self-pay

## 2023-12-17 NOTE — Telephone Encounter (Signed)
 Alert remote transmission:  HVR Event occurred 7/20 @ 01:46, HR 162, EGM c/w atrial driven 1:1 duration 1 min, 4 sec, triggered by 20 cycle 150.  Not a new finding  REQUEST:  Consider programming 20 cycle 175 to decrease non-actionable alerts

## 2023-12-29 DIAGNOSIS — G4733 Obstructive sleep apnea (adult) (pediatric): Secondary | ICD-10-CM | POA: Diagnosis not present

## 2024-01-14 ENCOUNTER — Ambulatory Visit (INDEPENDENT_AMBULATORY_CARE_PROVIDER_SITE_OTHER): Payer: BC Managed Care – PPO

## 2024-01-14 DIAGNOSIS — R001 Bradycardia, unspecified: Secondary | ICD-10-CM

## 2024-01-15 LAB — CUP PACEART REMOTE DEVICE CHECK
Battery Remaining Longevity: 44 mo
Battery Remaining Percentage: 50 %
Battery Voltage: 2.98 V
Brady Statistic AP VP Percent: 8.4 %
Brady Statistic AP VS Percent: 61 %
Brady Statistic AS VP Percent: 1 %
Brady Statistic AS VS Percent: 31 %
Brady Statistic RA Percent Paced: 68 %
Brady Statistic RV Percent Paced: 8.5 %
Date Time Interrogation Session: 20250818020015
Implantable Lead Connection Status: 753985
Implantable Lead Connection Status: 753985
Implantable Lead Implant Date: 20061219
Implantable Lead Implant Date: 20061219
Implantable Lead Location: 753859
Implantable Lead Location: 753860
Implantable Pulse Generator Implant Date: 20191204
Lead Channel Impedance Value: 460 Ohm
Lead Channel Impedance Value: 750 Ohm
Lead Channel Pacing Threshold Amplitude: 1.125 V
Lead Channel Pacing Threshold Amplitude: 1.875 V
Lead Channel Pacing Threshold Pulse Width: 0.5 ms
Lead Channel Pacing Threshold Pulse Width: 0.8 ms
Lead Channel Sensing Intrinsic Amplitude: 4.8 mV
Lead Channel Sensing Intrinsic Amplitude: 8.4 mV
Lead Channel Setting Pacing Amplitude: 1.375
Lead Channel Setting Pacing Amplitude: 3.375
Lead Channel Setting Pacing Pulse Width: 0.5 ms
Lead Channel Setting Sensing Sensitivity: 2.5 mV
Pulse Gen Model: 2272
Pulse Gen Serial Number: 9090970

## 2024-01-17 ENCOUNTER — Ambulatory Visit: Payer: Self-pay | Admitting: Cardiovascular Disease

## 2024-01-21 ENCOUNTER — Telehealth: Admitting: Family Medicine

## 2024-01-21 ENCOUNTER — Other Ambulatory Visit: Payer: Self-pay | Admitting: Family Medicine

## 2024-01-21 DIAGNOSIS — L539 Erythematous condition, unspecified: Secondary | ICD-10-CM

## 2024-01-21 NOTE — Progress Notes (Signed)
 E-Visit for Redness  We are sorry that you are not feeling well. Here is how we plan to help!  Based on what you shared with me it looks like you have redness, and even with a history of cellulitis, but it there is not enough evidence for a bacterial infection. Lets try elevating, compression, cold compresses, to see if it will resolve, but if it does worsen or not improving over next 48 hours to follow up   GET HELP RIGHT AWAY IF:  Symptoms that don't begin to go away within 48 hours. Severe redness persists or worsens If the area turns color, spreads or swells. If it blisters and opens, develops yellow-brown crust or bleeds. You develop a fever or chills. If the pain increases or becomes unbearable.  Are unable to keep fluids and food down.  MAKE SURE YOU   Understand these instructions. Will watch your condition. Will get help right away if you are not doing well or get worse.  Thank you for choosing an e-visit.  Your e-visit answers were reviewed by a board certified advanced clinical practitioner to complete your personal care plan. Depending upon the condition, your plan could have included both over the counter or prescription medications.  Please review your pharmacy choice. Make sure the pharmacy is open so you can pick up prescription now. If there is a problem, you may contact your provider through [employer] and have the prescription routed to another pharmacy.  Your safety is important to us . If you have drug allergies check your prescription carefully.   For the next 24 hours you can use MyChart to ask questions about today's visit, request a non-urgent call back, or ask for a work or school excuse. You will get an email in the next two days asking about your experience. I hope that your e-visit has been valuable and will speed your recovery.  I provided 5 minutes of non face-to-face time during this encounter for chart review, medication and order placement, as  well as and documentation.

## 2024-01-22 NOTE — Telephone Encounter (Signed)
 LOV: 11/05/23 NOV: nothing scheduled  Last Refill: cephALEXin  (KEFLEX ) 500 MG capsule 11/05/23 21 capsule 0 refills

## 2024-01-23 ENCOUNTER — Telehealth: Payer: Self-pay

## 2024-01-23 NOTE — Telephone Encounter (Signed)
 Patient started taking left over Keflx on Monday and took last dose he had on hand from before. He has been taking it one tab three times a day. He states he had e visit on Monday and was told to use compress and not better call back. His brother passed today. Noticed spots on right leg and now spreading to left leg. I have set up for visit with Tabitha 8/28. Denies any fever, tenderness to touch. He is able to get around ok. I have reviewed all red words if any will go to ED before appointment.

## 2024-01-23 NOTE — Telephone Encounter (Signed)
 NOTED will see as scheduled

## 2024-01-23 NOTE — Telephone Encounter (Signed)
 I just this this via the other request.  Thank you for getting him set up and please give my condolences re: his brother.  Thanks.

## 2024-01-23 NOTE — Telephone Encounter (Addendum)
 I sent the rx, see other phone note.  Thanks.

## 2024-01-23 NOTE — Telephone Encounter (Signed)
 Copied from CRM #[id number]. Topic: Clinical - Medication Question >> Jan 23, 2024 12:58 PM Chiquita SQUIBB wrote: Reason for CRM: Patient is requesting a refill for the cephALEXin  cephALEXin  (KEFLEX ) 500 MG capsule. Patient is having a flare up of cellulitis again, and this worked last time for it. Refill request already submitted by the pharmacy.

## 2024-01-24 ENCOUNTER — Ambulatory Visit: Admitting: Family

## 2024-01-29 DIAGNOSIS — G4733 Obstructive sleep apnea (adult) (pediatric): Secondary | ICD-10-CM | POA: Diagnosis not present

## 2024-01-30 ENCOUNTER — Ambulatory Visit: Admitting: Family

## 2024-01-31 ENCOUNTER — Ambulatory Visit: Admitting: Family Medicine

## 2024-02-05 ENCOUNTER — Ambulatory Visit: Admitting: Family

## 2024-02-20 NOTE — Progress Notes (Signed)
 Remote PPM Transmission

## 2024-02-28 DIAGNOSIS — G4733 Obstructive sleep apnea (adult) (pediatric): Secondary | ICD-10-CM | POA: Diagnosis not present

## 2024-04-14 ENCOUNTER — Ambulatory Visit (INDEPENDENT_AMBULATORY_CARE_PROVIDER_SITE_OTHER): Payer: BC Managed Care – PPO

## 2024-04-14 DIAGNOSIS — R001 Bradycardia, unspecified: Secondary | ICD-10-CM | POA: Diagnosis not present

## 2024-04-15 LAB — CUP PACEART REMOTE DEVICE CHECK
Battery Remaining Longevity: 40 mo
Battery Remaining Percentage: 47 %
Battery Voltage: 2.96 V
Brady Statistic AP VP Percent: 9.5 %
Brady Statistic AP VS Percent: 59 %
Brady Statistic AS VP Percent: 1 %
Brady Statistic AS VS Percent: 31 %
Brady Statistic RA Percent Paced: 67 %
Brady Statistic RV Percent Paced: 9.5 %
Date Time Interrogation Session: 20251117020014
Implantable Lead Connection Status: 753985
Implantable Lead Connection Status: 753985
Implantable Lead Implant Date: 20061219
Implantable Lead Implant Date: 20061219
Implantable Lead Location: 753859
Implantable Lead Location: 753860
Implantable Pulse Generator Implant Date: 20191204
Lead Channel Impedance Value: 460 Ohm
Lead Channel Impedance Value: 730 Ohm
Lead Channel Pacing Threshold Amplitude: 1.25 V
Lead Channel Pacing Threshold Amplitude: 2.25 V
Lead Channel Pacing Threshold Pulse Width: 0.5 ms
Lead Channel Pacing Threshold Pulse Width: 0.8 ms
Lead Channel Sensing Intrinsic Amplitude: 5 mV
Lead Channel Sensing Intrinsic Amplitude: 9.4 mV
Lead Channel Setting Pacing Amplitude: 1.5 V
Lead Channel Setting Pacing Amplitude: 3.75 V
Lead Channel Setting Pacing Pulse Width: 0.5 ms
Lead Channel Setting Sensing Sensitivity: 2.5 mV
Pulse Gen Model: 2272
Pulse Gen Serial Number: 9090970

## 2024-04-16 NOTE — Progress Notes (Signed)
 Remote PPM Transmission

## 2024-04-28 ENCOUNTER — Ambulatory Visit: Payer: Self-pay | Admitting: Cardiovascular Disease

## 2024-07-14 ENCOUNTER — Ambulatory Visit: Payer: BC Managed Care – PPO

## 2024-10-13 ENCOUNTER — Ambulatory Visit: Payer: BC Managed Care – PPO

## 2024-10-14 ENCOUNTER — Ambulatory Visit

## 2024-10-24 ENCOUNTER — Ambulatory Visit

## 2025-01-12 ENCOUNTER — Ambulatory Visit: Payer: BC Managed Care – PPO

## 2025-04-13 ENCOUNTER — Ambulatory Visit: Payer: BC Managed Care – PPO
